# Patient Record
Sex: Female | Born: 1940
Health system: Southern US, Community
[De-identification: ages and names within clinical notes are randomized; demographics above are authoritative.]

## PROBLEM LIST (undated history)

## (undated) DIAGNOSIS — Z720 Tobacco use: Secondary | ICD-10-CM

## (undated) DIAGNOSIS — I499 Cardiac arrhythmia, unspecified: Secondary | ICD-10-CM

## (undated) DIAGNOSIS — I712 Thoracic aortic aneurysm, without rupture: Secondary | ICD-10-CM

## (undated) DIAGNOSIS — I509 Heart failure, unspecified: Secondary | ICD-10-CM

## (undated) DIAGNOSIS — IMO0002 Reserved for concepts with insufficient information to code with codable children: Secondary | ICD-10-CM

## (undated) DIAGNOSIS — L409 Psoriasis, unspecified: Secondary | ICD-10-CM

## (undated) DIAGNOSIS — J189 Pneumonia, unspecified organism: Secondary | ICD-10-CM

## (undated) DIAGNOSIS — M1990 Unspecified osteoarthritis, unspecified site: Secondary | ICD-10-CM

## (undated) DIAGNOSIS — C3490 Malignant neoplasm of unspecified part of unspecified bronchus or lung: Secondary | ICD-10-CM

## (undated) DIAGNOSIS — I10 Essential (primary) hypertension: Secondary | ICD-10-CM

## (undated) DIAGNOSIS — H43399 Other vitreous opacities, unspecified eye: Secondary | ICD-10-CM

## (undated) HISTORY — DX: Reserved for concepts with insufficient information to code with codable children: IMO0002

## (undated) HISTORY — DX: Thoracic aortic aneurysm, without rupture: I71.2

## (undated) HISTORY — DX: Tobacco use: Z72.0

## (undated) HISTORY — DX: Heart failure, unspecified: I50.9

## (undated) HISTORY — DX: Malignant neoplasm of unspecified part of unspecified bronchus or lung: C34.90

---

## 2011-08-21 DIAGNOSIS — R911 Solitary pulmonary nodule: Secondary | ICD-10-CM | POA: Insufficient documentation

## 2011-08-24 ENCOUNTER — Encounter: Payer: Self-pay | Admitting: *Deleted

## 2011-08-24 DIAGNOSIS — Z720 Tobacco use: Secondary | ICD-10-CM | POA: Insufficient documentation

## 2011-08-24 DIAGNOSIS — R911 Solitary pulmonary nodule: Secondary | ICD-10-CM

## 2011-08-28 ENCOUNTER — Institutional Professional Consult (permissible substitution) (INDEPENDENT_AMBULATORY_CARE_PROVIDER_SITE_OTHER): Payer: Medicare Other | Admitting: Thoracic Surgery (Cardiothoracic Vascular Surgery)

## 2011-08-28 ENCOUNTER — Encounter: Payer: Self-pay | Admitting: Thoracic Surgery (Cardiothoracic Vascular Surgery)

## 2011-08-28 ENCOUNTER — Other Ambulatory Visit: Payer: Self-pay | Admitting: Thoracic Surgery (Cardiothoracic Vascular Surgery)

## 2011-08-28 VITALS — BP 115/74 | HR 76 | Resp 20 | Ht 62.0 in | Wt 163.0 lb

## 2011-08-28 DIAGNOSIS — D381 Neoplasm of uncertain behavior of trachea, bronchus and lung: Secondary | ICD-10-CM

## 2011-08-28 DIAGNOSIS — R918 Other nonspecific abnormal finding of lung field: Secondary | ICD-10-CM

## 2011-08-28 DIAGNOSIS — R222 Localized swelling, mass and lump, trunk: Secondary | ICD-10-CM

## 2011-08-28 NOTE — Progress Notes (Signed)
PCP is Avon Gully, MD, MD Referring Provider is Avon Gully, MD  Chief Complaint  Patient presents with  . Lung Mass    Referral from Dr. Felecia Shelling for eval on lung mass, CTA Chest 08/21/11    HPI: 71 year old woman with history of tobacco abuse who presented last week with complaint of right arm pain. Her workup included a chest x-ray and then a CT of the chest. The CT of the chest showed a 1.7 x 1.2 cm mass in the central portion of the right upper lobe. She now is sent for evaluation of the lung mass.  She states this is still having some right arm pain she describes this as a pain on the medial aspect of the right upper arm and extending into her axilla this is related to movement and is not associated with shortness of breath. She states that also on the day that she went to the ED for evaluation of her right arm pain she first noted a small amount of hemoptysis. She's continued to have hemoptysis usually about 2-3 times a day again with very small amounts of clotted blood noted. She denies any chest tightness, shortness of breath, wheezing, weight loss.   Past Medical History  Diagnosis Date  . Tobacco abuse   . Lung nodule     No past surgical history on file.  Family History  Problem Relation Age of Onset  . Hypertension Mother   . Heart disease Mother   . Heart disease Sister   . Heart disease Brother     Social History History  Substance Use Topics  . Smoking status: Current Everyday Smoker -- 0.3 packs/day for 20 years    Types: Cigarettes  . Smokeless tobacco: Not on file  . Alcohol Use: Yes     casually    No current outpatient prescriptions on file.    No Known Allergies  Review of Systems  Constitutional: Negative for fever, activity change, appetite change, fatigue and unexpected weight change.  HENT: Negative.   Eyes: Negative.   Respiratory: Positive for cough (hemoptysis). Negative for chest tightness, shortness of breath and wheezing.     Cardiovascular: Positive for leg swelling (occassional, ankles). Negative for chest pain and palpitations.  Genitourinary: Negative.   Musculoskeletal:       Right axillary and arm pain with decreased ROM  Neurological: Negative for dizziness, syncope, speech difficulty, light-headedness and headaches.  Hematological: Negative.   Psychiatric/Behavioral: Negative.   All other systems reviewed and are negative.     BP 115/74  Pulse 76  Resp 20  Ht 5\' 2"  (1.575 m)  Wt 163 lb (73.936 kg)  BMI 29.81 kg/m2  SpO2 98% Physical Exam  Vitals reviewed. Constitutional: She is oriented to person, place, and time. She appears well-developed and well-nourished. No distress.  HENT:  Head: Normocephalic and atraumatic.  Eyes: EOM are normal. Pupils are equal, round, and reactive to light.  Neck: Normal range of motion. Neck supple. No JVD present. No tracheal deviation present. No thyromegaly present.  Cardiovascular: Normal rate, regular rhythm and normal heart sounds.  Exam reveals no gallop and no friction rub.   No murmur heard. Pulmonary/Chest: Effort normal and breath sounds normal. No respiratory distress. She has no wheezes.  Abdominal: Soft. There is no tenderness.  Musculoskeletal: She exhibits tenderness.  Lymphadenopathy:    She has no cervical adenopathy.  Neurological: She is alert and oriented to person, place, and time.  Skin: Skin is warm and dry.  Diagnostic Tests: CT of chest from 08/21/2011 reviewed. It shows a 1.7 x 1.3 cm central right upper lobe mass is relatively smooth and rounded and closely abutting the right upper lobe pulmonary artery. Multiple patchy densities in both upper lobes and right middle lobe. No distinct hilar or mediastinal adenopathy  Impression:  71 year old woman with a history of tobacco abuse now found to have a 1.7 x 1.3 cm right upper lobe mass. This has to be considered for lung cancer to be proven otherwise. There are multiple  indistinct patchy opacities in both lungs which may or may not be related to the mass itself. There's no definite adenopathy although the paratracheal nodes do appear a little prominent on the CT scan.  I recommended to Ms. Henault that we proceed with a PET/CT to further characterize the mass, evaluate the hilar and mediastinal nodes and rule out any evidence of distant spread..  We will also schedule her for pulmonary function testing with and without bronchodilators to assess her potential fitness for pulmonary resection. This lesion is very central require a lobectomy to remove the lesion.  Plan: PET CT and pulmonary function testing Followup in one to 2 weeks after studies are done to discuss the next step, which depending on the findings could be bronchoscopic biopsy versus proceeding with surgical resection.

## 2011-08-29 HISTORY — PX: OTHER SURGICAL HISTORY: SHX169

## 2011-09-05 ENCOUNTER — Ambulatory Visit (HOSPITAL_COMMUNITY)
Admission: RE | Admit: 2011-09-05 | Discharge: 2011-09-05 | Disposition: A | Discharge status: Home / Self Care | Payer: Medicare Other | Source: Ambulatory Visit | Attending: Thoracic Surgery (Cardiothoracic Vascular Surgery) | Admitting: Thoracic Surgery (Cardiothoracic Vascular Surgery)

## 2011-09-05 ENCOUNTER — Encounter (HOSPITAL_COMMUNITY)
Admission: RE | Admit: 2011-09-05 | Discharge: 2011-09-05 | Disposition: A | Discharge status: Home / Self Care | Payer: Medicare Other | Source: Ambulatory Visit | Attending: Thoracic Surgery (Cardiothoracic Vascular Surgery) | Admitting: Thoracic Surgery (Cardiothoracic Vascular Surgery)

## 2011-09-05 ENCOUNTER — Other Ambulatory Visit (HOSPITAL_COMMUNITY): Payer: Self-pay | Admitting: Radiology

## 2011-09-05 DIAGNOSIS — J984 Other disorders of lung: Secondary | ICD-10-CM | POA: Insufficient documentation

## 2011-09-05 DIAGNOSIS — K8020 Calculus of gallbladder without cholecystitis without obstruction: Secondary | ICD-10-CM | POA: Insufficient documentation

## 2011-09-05 DIAGNOSIS — D259 Leiomyoma of uterus, unspecified: Secondary | ICD-10-CM | POA: Insufficient documentation

## 2011-09-05 DIAGNOSIS — D381 Neoplasm of uncertain behavior of trachea, bronchus and lung: Secondary | ICD-10-CM

## 2011-09-05 DIAGNOSIS — R911 Solitary pulmonary nodule: Secondary | ICD-10-CM | POA: Insufficient documentation

## 2011-09-05 DIAGNOSIS — R222 Localized swelling, mass and lump, trunk: Secondary | ICD-10-CM | POA: Insufficient documentation

## 2011-09-05 LAB — BLOOD GAS, ARTERIAL
Acid-Base Excess: 2.2 mmol/L — ABNORMAL HIGH (ref 0.0–2.0)
Drawn by: 340271
O2 Saturation: 95.5 %
TCO2: 23.2 mmol/L (ref 0–100)
pCO2 arterial: 37.7 mmHg (ref 35.0–45.0)
pO2, Arterial: 79.6 mmHg — ABNORMAL LOW (ref 80.0–100.0)

## 2011-09-05 LAB — PULMONARY FUNCTION TEST

## 2011-09-05 LAB — GLUCOSE, CAPILLARY: Glucose-Capillary: 98 mg/dL (ref 70–99)

## 2011-09-05 MED ORDER — FLUDEOXYGLUCOSE F - 18 (FDG) INJECTION
19.6000 | Freq: Once | INTRAVENOUS | Status: AC | PRN
  Administered 2011-09-05: 19.6 via INTRAVENOUS

## 2011-09-05 MED ORDER — ALBUTEROL SULFATE (5 MG/ML) 0.5% IN NEBU
2.5000 mg | INHALATION_SOLUTION | Freq: Once | RESPIRATORY_TRACT | Status: AC
  Administered 2011-09-05: 2.5 mg via RESPIRATORY_TRACT

## 2011-09-07 DIAGNOSIS — C3490 Malignant neoplasm of unspecified part of unspecified bronchus or lung: Secondary | ICD-10-CM

## 2011-09-07 HISTORY — DX: Malignant neoplasm of unspecified part of unspecified bronchus or lung: C34.90

## 2011-09-11 ENCOUNTER — Encounter: Payer: Medicare Other | Admitting: Thoracic Surgery (Cardiothoracic Vascular Surgery)

## 2011-09-14 ENCOUNTER — Other Ambulatory Visit: Payer: Self-pay

## 2011-09-14 ENCOUNTER — Encounter: Payer: Self-pay | Admitting: Thoracic Surgery (Cardiothoracic Vascular Surgery)

## 2011-09-14 ENCOUNTER — Ambulatory Visit (INDEPENDENT_AMBULATORY_CARE_PROVIDER_SITE_OTHER): Payer: Medicare Other | Admitting: Thoracic Surgery (Cardiothoracic Vascular Surgery)

## 2011-09-14 VITALS — BP 159/95 | HR 70 | Resp 16 | Ht 68.0 in | Wt 162.0 lb

## 2011-09-14 DIAGNOSIS — R222 Localized swelling, mass and lump, trunk: Secondary | ICD-10-CM

## 2011-09-14 DIAGNOSIS — R918 Other nonspecific abnormal finding of lung field: Secondary | ICD-10-CM

## 2011-09-14 DIAGNOSIS — D381 Neoplasm of uncertain behavior of trachea, bronchus and lung: Secondary | ICD-10-CM

## 2011-09-14 DIAGNOSIS — Z7189 Other specified counseling: Secondary | ICD-10-CM

## 2011-09-14 DIAGNOSIS — Z712 Person consulting for explanation of examination or test findings: Secondary | ICD-10-CM

## 2011-09-14 NOTE — Progress Notes (Signed)
  HPI:  Heather [name] returns today to discuss the results of her testing. In the office on 08/28/2011 to evaluate a 1.7 x 1.2 cm right upper lobe mass. In the interim since her last visit she's been doing well no new complaints. Her right arm pain has improved.  She had a past on 09/05/2011 which showed marked hypermetabolic activity in the right upper lobe mass. There is a second peripheral area in the right upper lobe there was more family hypermetabolic, which is felt to represent some postobstructive pneumonitis. A femoral cavity in the left upper lobe showed no evidence of hypermetabolic activity, nor did the faint nodular densities in both lungs. There also was no evidence of metastatic disease.  She also had pulmonary function testing done. Her FEV1 was 2.36 (109% predicted), FVC was 3.03 (108% predicted), diffusion capacity corrected was 57%  No current outpatient prescriptions on file.    Physical Exam BP 159/95  Pulse 70  Resp 16  Ht 5' 8" (1.727 m)  Wt 162 lb (73.483 kg)  BMI 24.63 kg/m2  SpO2 99% General well-developed well-nourished, anxious but no acute distress Lungs clear with equal breath sounds  Diagnostic Tests: PET/CT and pulmonary function testing reviewed, findings as previously noted  Impression: 70-year-old woman with a history of tobacco abuse with a newly discovered 1.7 cm right upper lobe nodule which is hypermetabolic by PET. This in all likelihood represents primary bronchogenic carcinoma and has be considered such until proven otherwise. This appears to be a clinical stage I lesion.  I discussed with her the options of bronchoscopic biopsy versus proceeding with video-assisted thoracoscopy and right upper lobectomy. This lesion is nonamenable to a wedge resection for biopsy. I did discuss with her that with bronchoscopic biopsy if positive then she would need surgery. Unfortunately because of a relatively higher false-negative rate with bronchoscopic biopsy we  would never be 100% sure that this was not a cancer if it was not seen on the biopsy..  My recommendation is to proceed with right VATS, right upper lobectomy and lymph node dissection. I discussed in detail with her the indications, risks, benefits, and alternatives. We discussed the nature of the procedure, need for general anesthesia, incision be used, expected hospital stay, and overall recovery. She does understand that this is a cancer appeared to be relatively early stage a potential for a very good chance of cure, although it is never guaranteed. She understands that the risk of surgery include but are not limited to death, stroke, MI, bleeding, possible need for transfusion, infection, DVT, PE, prolonged air leak, as well as other unpredictable or unforeseeable complications. She understands and accepts these risks and agrees to proceed.  Plan: Right VATS, right upper lobectomy, lymph node dissection on Monday, 09/24/2011.   

## 2011-09-17 ENCOUNTER — Encounter (HOSPITAL_COMMUNITY): Payer: Self-pay | Admitting: Pharmacy Technician

## 2011-09-20 ENCOUNTER — Other Ambulatory Visit: Payer: Self-pay

## 2011-09-20 ENCOUNTER — Encounter (HOSPITAL_COMMUNITY)
Admission: RE | Admit: 2011-09-20 | Discharge: 2011-09-20 | Disposition: A | Discharge status: Home / Self Care | Payer: Medicare Other | Source: Ambulatory Visit | Attending: Thoracic Surgery (Cardiothoracic Vascular Surgery) | Admitting: Thoracic Surgery (Cardiothoracic Vascular Surgery)

## 2011-09-20 ENCOUNTER — Encounter (HOSPITAL_COMMUNITY): Payer: Self-pay

## 2011-09-20 VITALS — BP 158/89 | HR 58 | Temp 97.0°F | Resp 20 | Ht 67.0 in | Wt 170.2 lb

## 2011-09-20 DIAGNOSIS — D381 Neoplasm of uncertain behavior of trachea, bronchus and lung: Secondary | ICD-10-CM

## 2011-09-20 HISTORY — DX: Psoriasis, unspecified: L40.9

## 2011-09-20 HISTORY — DX: Unspecified osteoarthritis, unspecified site: M19.90

## 2011-09-20 HISTORY — DX: Other vitreous opacities, unspecified eye: H43.399

## 2011-09-20 LAB — URINE MICROSCOPIC-ADD ON

## 2011-09-20 LAB — COMPREHENSIVE METABOLIC PANEL
ALT: 14 U/L (ref 0–35)
AST: 21 U/L (ref 0–37)
Albumin: 3.6 g/dL (ref 3.5–5.2)
CO2: 23 mEq/L (ref 19–32)
Chloride: 104 mEq/L (ref 96–112)
GFR calc non Af Amer: 86 mL/min — ABNORMAL LOW (ref >90)
Potassium: 4.2 mEq/L (ref 3.5–5.1)
Sodium: 137 mEq/L (ref 135–145)
Total Bilirubin: 0.5 mg/dL (ref 0.3–1.2)

## 2011-09-20 LAB — CBC
Platelets: 233 10*3/uL (ref 150–400)
RBC: 3.96 MIL/uL (ref 3.87–5.11)
RDW: 14.4 % (ref 11.5–15.5)
WBC: 8.6 10*3/uL (ref 4.0–10.5)

## 2011-09-20 LAB — URINALYSIS, ROUTINE W REFLEX MICROSCOPIC
Glucose, UA: NEGATIVE mg/dL
Ketones, ur: NEGATIVE mg/dL
Nitrite: NEGATIVE
Protein, ur: NEGATIVE mg/dL
pH: 6.5 (ref 5.0–8.0)

## 2011-09-20 LAB — BLOOD GAS, ARTERIAL
Acid-Base Excess: 0.7 mmol/L (ref 0.0–2.0)
Drawn by: 206361
pCO2 arterial: 38.2 mmHg (ref 35.0–45.0)
pH, Arterial: 7.424 — ABNORMAL HIGH (ref 7.350–7.400)
pO2, Arterial: 102 mmHg — ABNORMAL HIGH (ref 80.0–100.0)

## 2011-09-20 LAB — TYPE AND SCREEN

## 2011-09-20 LAB — APTT: aPTT: 37 seconds (ref 24–37)

## 2011-09-20 LAB — PROTIME-INR: INR: 1.02 (ref 0.00–1.49)

## 2011-09-20 NOTE — Pre-Procedure Instructions (Signed)
20 ABBEGALE STEHLE  09/20/2011   Your procedure is scheduled on:  Monday, March 18th.  Report to [facility] at 5:30 AM.  Call this number if you have problems the morning of surgery: [phone]   Remember:   Do not eat food:After Midnight.  May have clear liquids: up to 4 Hours before arrival.  Clear liquids include soda, tea, black coffee, apple or grape juice, broth.  Take these medicines the morning of surgery with A SIP OF WATER: NA   Do not wear jewelry, make-up or nail polish.  Do not wear lotions, powders, or perfumes. You may wear deodorant.  Do not shave 48 hours prior to surgery.  Do not bring valuables to the hospital.  Contacts, dentures or bridgework may not be worn into surgery.  Leave suitcase in the car. After surgery it may be brought to your room.  For patients admitted to the hospital, checkout time is 11:00 AM the day of discharge.   Patients discharged the day of surgery will not be allowed to drive home.  Name and phone number of your driver: --  Special Instructions: CHG Shower Use Special Wash: 1/2 bottle night before surgery and 1/2 bottle morning of surgery.   Please read over the following fact sheets that you were given: Pain Booklet, Coughing and Deep Breathing, Blood Transfusion Information, MRSA Information and Surgical Site Infection Prevention

## 2011-09-23 MED ORDER — DEXTROSE 5 % IV SOLN
1.5000 g | INTRAVENOUS | Status: AC
  Administered 2011-09-24: 1.5 g via INTRAVENOUS
  Filled 2011-09-23: qty 1.5

## 2011-09-24 ENCOUNTER — Encounter (HOSPITAL_COMMUNITY): Payer: Self-pay | Admitting: Anesthesiology

## 2011-09-24 ENCOUNTER — Ambulatory Visit (HOSPITAL_COMMUNITY): Payer: Medicare Other | Admitting: Anesthesiology

## 2011-09-24 ENCOUNTER — Ambulatory Visit (HOSPITAL_COMMUNITY): Payer: Medicare Other

## 2011-09-24 ENCOUNTER — Encounter (HOSPITAL_COMMUNITY)
Admission: RE | Disposition: A | Discharge status: Home / Self Care | Payer: Self-pay | Source: Ambulatory Visit | Attending: Thoracic Surgery (Cardiothoracic Vascular Surgery)

## 2011-09-24 ENCOUNTER — Encounter (HOSPITAL_COMMUNITY): Payer: Self-pay | Admitting: Surgery

## 2011-09-24 ENCOUNTER — Inpatient Hospital Stay (HOSPITAL_COMMUNITY)
Admission: RE | Admit: 2011-09-24 | Discharge: 2011-10-01 | DRG: 164 | Disposition: A | Discharge status: Home / Self Care | Payer: Medicare Other | Source: Ambulatory Visit | Attending: Thoracic Surgery (Cardiothoracic Vascular Surgery) | Admitting: Thoracic Surgery (Cardiothoracic Vascular Surgery)

## 2011-09-24 DIAGNOSIS — D381 Neoplasm of uncertain behavior of trachea, bronchus and lung: Secondary | ICD-10-CM

## 2011-09-24 DIAGNOSIS — Z79899 Other long term (current) drug therapy: Secondary | ICD-10-CM

## 2011-09-24 DIAGNOSIS — K5900 Constipation, unspecified: Secondary | ICD-10-CM | POA: Diagnosis not present

## 2011-09-24 DIAGNOSIS — Z87891 Personal history of nicotine dependence: Secondary | ICD-10-CM

## 2011-09-24 DIAGNOSIS — C3410 Malignant neoplasm of upper lobe, unspecified bronchus or lung: Principal | ICD-10-CM | POA: Diagnosis present

## 2011-09-24 DIAGNOSIS — J95811 Postprocedural pneumothorax: Secondary | ICD-10-CM | POA: Diagnosis not present

## 2011-09-24 DIAGNOSIS — Z01812 Encounter for preprocedural laboratory examination: Secondary | ICD-10-CM

## 2011-09-24 DIAGNOSIS — I4891 Unspecified atrial fibrillation: Secondary | ICD-10-CM | POA: Diagnosis not present

## 2011-09-24 DIAGNOSIS — Y836 Removal of other organ (partial) (total) as the cause of abnormal reaction of the patient, or of later complication, without mention of misadventure at the time of the procedure: Secondary | ICD-10-CM | POA: Diagnosis not present

## 2011-09-24 DIAGNOSIS — Y9210 Unspecified residential institution as the place of occurrence of the external cause: Secondary | ICD-10-CM | POA: Diagnosis not present

## 2011-09-24 DIAGNOSIS — R110 Nausea: Secondary | ICD-10-CM | POA: Diagnosis not present

## 2011-09-24 SURGERY — VIDEO ASSISTED THORACOSCOPY (VATS)/ LOBECTOMY
Anesthesia: General | Site: Chest | Laterality: Right | Wound class: Clean Contaminated

## 2011-09-24 MED ORDER — BUPIVACAINE ON-Q PAIN PUMP (FOR ORDER SET NO CHG)
INJECTION | Status: DC
  Filled 2011-09-24: qty 1

## 2011-09-24 MED ORDER — DEXTROSE-NACL 5-0.45 % IV SOLN
INTRAVENOUS | Status: DC
  Administered 2011-09-24 – 2011-09-25 (×3): via INTRAVENOUS
  Administered 2011-09-26: 20 mL/h via INTRAVENOUS
  Administered 2011-09-26: 06:00:00 via INTRAVENOUS
  Administered 2011-09-30: 20 mL via INTRAVENOUS

## 2011-09-24 MED ORDER — MUPIROCIN 2 % EX OINT
TOPICAL_OINTMENT | CUTANEOUS | Status: AC
  Administered 2011-09-24: 1 via NASAL
  Filled 2011-09-24: qty 22

## 2011-09-24 MED ORDER — PROPOFOL 10 MG/ML IV EMUL
INTRAVENOUS | Status: DC | PRN
  Administered 2011-09-24: 120 mg via INTRAVENOUS

## 2011-09-24 MED ORDER — BISACODYL 5 MG PO TBEC
10.0000 mg | DELAYED_RELEASE_TABLET | Freq: Every day | ORAL | Status: DC
  Administered 2011-09-25 – 2011-09-30 (×6): 10 mg via ORAL
  Filled 2011-09-24 (×4): qty 2
  Filled 2011-09-24: qty 1
  Filled 2011-09-24 (×2): qty 2

## 2011-09-24 MED ORDER — LIDOCAINE HCL (CARDIAC) 20 MG/ML IV SOLN
INTRAVENOUS | Status: DC | PRN
  Administered 2011-09-24: 40 mg via INTRAVENOUS

## 2011-09-24 MED ORDER — LEVALBUTEROL HCL 0.63 MG/3ML IN NEBU
0.6300 mg | INHALATION_SOLUTION | Freq: Four times a day (QID) | RESPIRATORY_TRACT | Status: DC
  Administered 2011-09-24 – 2011-09-25 (×5): 0.63 mg via RESPIRATORY_TRACT
  Filled 2011-09-24 (×8): qty 3

## 2011-09-24 MED ORDER — OXYCODONE-ACETAMINOPHEN 5-325 MG PO TABS
1.0000 | ORAL_TABLET | ORAL | Status: DC | PRN
  Administered 2011-09-25 – 2011-09-30 (×8): 1 via ORAL
  Filled 2011-09-24 (×8): qty 1

## 2011-09-24 MED ORDER — MIDAZOLAM HCL 5 MG/5ML IJ SOLN
INTRAMUSCULAR | Status: DC | PRN
  Administered 2011-09-24 (×2): 1 mg via INTRAVENOUS

## 2011-09-24 MED ORDER — ENOXAPARIN SODIUM 40 MG/0.4ML ~~LOC~~ SOLN
40.0000 mg | Freq: Two times a day (BID) | SUBCUTANEOUS | Status: DC
  Administered 2011-09-25: 40 mg via SUBCUTANEOUS
  Filled 2011-09-24 (×3): qty 0.4

## 2011-09-24 MED ORDER — ONDANSETRON HCL 4 MG/2ML IJ SOLN
INTRAMUSCULAR | Status: DC | PRN
  Administered 2011-09-24: 4 mg via INTRAVENOUS

## 2011-09-24 MED ORDER — DIPHENHYDRAMINE HCL 12.5 MG/5ML PO ELIX
12.5000 mg | ORAL_SOLUTION | Freq: Four times a day (QID) | ORAL | Status: DC | PRN
  Filled 2011-09-24: qty 5

## 2011-09-24 MED ORDER — SODIUM CHLORIDE 0.9 % IJ SOLN: 9.0000 mL | INTRAMUSCULAR | Status: DC | PRN

## 2011-09-24 MED ORDER — LACTATED RINGERS IV SOLN
INTRAVENOUS | Status: DC | PRN
  Administered 2011-09-24: 07:00:00 via INTRAVENOUS

## 2011-09-24 MED ORDER — GLYCOPYRROLATE 0.2 MG/ML IJ SOLN
INTRAMUSCULAR | Status: DC | PRN
  Administered 2011-09-24: .6 mg via INTRAVENOUS

## 2011-09-24 MED ORDER — LACTATED RINGERS IV SOLN
INTRAVENOUS | Status: DC | PRN
  Administered 2011-09-24: 08:00:00 via INTRAVENOUS

## 2011-09-24 MED ORDER — VITAMIN B-12 250 MCG PO TABS: 250.0000 ug | ORAL_TABLET | Freq: Every day | ORAL | Status: DC

## 2011-09-24 MED ORDER — NALOXONE HCL 0.4 MG/ML IJ SOLN
0.4000 mg | INTRAMUSCULAR | Status: DC | PRN
  Filled 2011-09-24: qty 1

## 2011-09-24 MED ORDER — TRAMADOL HCL 50 MG PO TABS
50.0000 mg | ORAL_TABLET | Freq: Four times a day (QID) | ORAL | Status: DC | PRN
Start: 2011-09-24 — End: 2011-10-01
  Administered 2011-09-28: 50 mg via ORAL
  Administered 2011-09-29: 100 mg via ORAL
  Administered 2011-09-29 (×2): 50 mg via ORAL
  Administered 2011-09-30 – 2011-10-01 (×2): 100 mg via ORAL
  Filled 2011-09-24 (×2): qty 2
  Filled 2011-09-24: qty 1
  Filled 2011-09-24: qty 2
  Filled 2011-09-24 (×2): qty 1

## 2011-09-24 MED ORDER — OXYCODONE-ACETAMINOPHEN 5-325 MG PO TABS
1.0000 | ORAL_TABLET | ORAL | Status: DC | PRN
  Administered 2011-09-26: 1 via ORAL
  Administered 2011-10-01: 2 via ORAL
  Filled 2011-09-24: qty 1
  Filled 2011-09-24: qty 2

## 2011-09-24 MED ORDER — DIPHENHYDRAMINE HCL 50 MG/ML IJ SOLN
12.5000 mg | Freq: Four times a day (QID) | INTRAMUSCULAR | Status: DC | PRN
  Filled 2011-09-24: qty 0.25

## 2011-09-24 MED ORDER — ROCURONIUM BROMIDE 100 MG/10ML IV SOLN
INTRAVENOUS | Status: DC | PRN
  Administered 2011-09-24: 5 mg via INTRAVENOUS
  Administered 2011-09-24: 10 mg via INTRAVENOUS
  Administered 2011-09-24: 70 mg via INTRAVENOUS
  Administered 2011-09-24: 10 mg via INTRAVENOUS
  Administered 2011-09-24: 5 mg via INTRAVENOUS

## 2011-09-24 MED ORDER — EPHEDRINE SULFATE 50 MG/ML IJ SOLN
INTRAMUSCULAR | Status: DC | PRN
  Administered 2011-09-24: 10 mg via INTRAVENOUS

## 2011-09-24 MED ORDER — HEMOSTATIC AGENTS (NO CHARGE) OPTIME
TOPICAL | Status: DC | PRN
  Administered 2011-09-24: 1 via TOPICAL

## 2011-09-24 MED ORDER — SENNOSIDES-DOCUSATE SODIUM 8.6-50 MG PO TABS
1.0000 | ORAL_TABLET | Freq: Every evening | ORAL | Status: DC | PRN
  Filled 2011-09-24: qty 1

## 2011-09-24 MED ORDER — VECURONIUM BROMIDE 10 MG IV SOLR
INTRAVENOUS | Status: DC | PRN
  Administered 2011-09-24: 1 mg via INTRAVENOUS

## 2011-09-24 MED ORDER — BUPIVACAINE 0.25 % ON-Q PUMP SINGLE CATH 400 ML
INJECTION | Status: DC | PRN
  Administered 2011-09-24: 400 mL

## 2011-09-24 MED ORDER — DROPERIDOL 2.5 MG/ML IJ SOLN: 0.6250 mg | INTRAMUSCULAR | Status: DC | PRN

## 2011-09-24 MED ORDER — FENTANYL CITRATE 0.05 MG/ML IJ SOLN
INTRAMUSCULAR | Status: DC | PRN
  Administered 2011-09-24 (×3): 50 ug via INTRAVENOUS
  Administered 2011-09-24: 150 ug via INTRAVENOUS
  Administered 2011-09-24: 50 ug via INTRAVENOUS
  Administered 2011-09-24: 25 ug via INTRAVENOUS
  Administered 2011-09-24: 50 ug via INTRAVENOUS
  Administered 2011-09-24: 25 ug via INTRAVENOUS
  Administered 2011-09-24: 50 ug via INTRAVENOUS

## 2011-09-24 MED ORDER — BUPIVACAINE 0.25 % ON-Q PUMP SINGLE CATH 400 ML
400.0000 mL | INJECTION | Status: DC
  Filled 2011-09-24: qty 400

## 2011-09-24 MED ORDER — FENTANYL 10 MCG/ML IV SOLN
INTRAVENOUS | Status: DC
  Administered 2011-09-24: 20 ug via INTRAVENOUS
  Administered 2011-09-24: 13:00:00 via INTRAVENOUS
  Administered 2011-09-24 – 2011-09-25 (×2): 30 ug via INTRAVENOUS
  Administered 2011-09-25: 40 ug via INTRAVENOUS
  Administered 2011-09-25: 50 ug via INTRAVENOUS
  Administered 2011-09-26: 40 ug via INTRAVENOUS
  Administered 2011-09-26: 30 ug via INTRAVENOUS
  Administered 2011-09-26: 40 ug via INTRAVENOUS
  Administered 2011-09-27: 30 ug via INTRAVENOUS
  Administered 2011-09-27: 20 ug via INTRAVENOUS
  Administered 2011-09-27: 50 ug via INTRAVENOUS
  Administered 2011-09-27: 70 ug via INTRAVENOUS
  Administered 2011-09-27 (×2): 40 ug via INTRAVENOUS
  Administered 2011-09-28: via INTRAVENOUS
  Administered 2011-09-28: 88 ug via INTRAVENOUS
  Administered 2011-09-28: 130 ug via INTRAVENOUS
  Administered 2011-09-28: 100 ug via INTRAVENOUS
  Administered 2011-09-28: 50 ug via INTRAVENOUS
  Administered 2011-09-29: 05:00:00 via INTRAVENOUS
  Administered 2011-09-29: 90 ug via INTRAVENOUS
  Administered 2011-09-29 (×2): 20 ug via INTRAVENOUS
  Administered 2011-09-30: 40 ug via INTRAVENOUS
  Administered 2011-09-30: 20 ug via INTRAVENOUS
  Filled 2011-09-24 (×5): qty 50

## 2011-09-24 MED ORDER — 0.9 % SODIUM CHLORIDE (POUR BTL) OPTIME
TOPICAL | Status: DC | PRN
  Administered 2011-09-24: 3000 mL

## 2011-09-24 MED ORDER — CYANOCOBALAMIN 250 MCG PO TABS
250.0000 ug | ORAL_TABLET | Freq: Every day | ORAL | Status: DC
  Administered 2011-09-25 – 2011-09-26 (×2): 250 ug via ORAL
  Administered 2011-09-27: 11:00:00 via ORAL
  Administered 2011-09-28 – 2011-10-01 (×4): 250 ug via ORAL
  Filled 2011-09-24 (×8): qty 1

## 2011-09-24 MED ORDER — DEXTROSE 5 % IV SOLN
1.5000 g | Freq: Two times a day (BID) | INTRAVENOUS | Status: AC
  Administered 2011-09-24 – 2011-09-25 (×2): 1.5 g via INTRAVENOUS
  Filled 2011-09-24 (×2): qty 1.5

## 2011-09-24 MED ORDER — ACETAMINOPHEN 10 MG/ML IV SOLN
1000.0000 mg | Freq: Four times a day (QID) | INTRAVENOUS | Status: AC
  Administered 2011-09-24 – 2011-09-25 (×4): 1000 mg via INTRAVENOUS
  Filled 2011-09-24 (×4): qty 100

## 2011-09-24 MED ORDER — ONDANSETRON HCL 4 MG/2ML IJ SOLN
4.0000 mg | Freq: Four times a day (QID) | INTRAMUSCULAR | Status: DC | PRN
  Administered 2011-09-24 – 2011-09-25 (×2): 4 mg via INTRAVENOUS
  Filled 2011-09-24 (×3): qty 2

## 2011-09-24 MED ORDER — NEOSTIGMINE METHYLSULFATE 1 MG/ML IJ SOLN
INTRAMUSCULAR | Status: DC | PRN
  Administered 2011-09-24: 4.5 mg via INTRAVENOUS

## 2011-09-24 MED ORDER — OXYCODONE HCL 5 MG PO TABS: 5.0000 mg | ORAL_TABLET | ORAL | Status: AC | PRN

## 2011-09-24 MED ORDER — ONDANSETRON HCL 4 MG/2ML IJ SOLN: 4.0000 mg | Freq: Four times a day (QID) | INTRAMUSCULAR | Status: DC | PRN

## 2011-09-24 MED ORDER — HYDROMORPHONE HCL PF 1 MG/ML IJ SOLN
0.2500 mg | INTRAMUSCULAR | Status: DC | PRN
  Administered 2011-09-24: 0.5 mg via INTRAVENOUS
  Administered 2011-09-24: 1 mg via INTRAVENOUS
  Administered 2011-09-24: 0.5 mg via INTRAVENOUS

## 2011-09-24 MED ORDER — POTASSIUM CHLORIDE 10 MEQ/50ML IV SOLN: 10.0000 meq | Freq: Every day | INTRAVENOUS | Status: DC | PRN

## 2011-09-24 SURGICAL SUPPLY — 79 items
APPLIER CLIP ROT 10 11.4 M/L (STAPLE)
CANISTER SUCTION 2500CC (MISCELLANEOUS) ×4 IMPLANT
CATH KIT ON Q 5IN SLV (PAIN MANAGEMENT) ×2 IMPLANT
CATH THORACIC 28FR (CATHETERS) ×2 IMPLANT
CATH THORACIC 28FR RT ANG (CATHETERS) ×2 IMPLANT
CATH THORACIC 36FR (CATHETERS) IMPLANT
CATH THORACIC 36FR RT ANG (CATHETERS) IMPLANT
CLIP APPLIE ROT 10 11.4 M/L (STAPLE) IMPLANT
CLIP TI MEDIUM 24 (CLIP) ×2 IMPLANT
CLIP TI MEDIUM 6 (CLIP) ×2 IMPLANT
CLOSURE STERI-STRIP 1/4X4 (GAUZE/BANDAGES/DRESSINGS) ×2 IMPLANT
CLOTH BEACON ORANGE TIMEOUT ST (SAFETY) ×2 IMPLANT
CONN Y 3/8X3/8X3/8  BEN (MISCELLANEOUS) ×1
CONN Y 3/8X3/8X3/8 BEN (MISCELLANEOUS) ×1 IMPLANT
CONT SPEC 4OZ CLIKSEAL STRL BL (MISCELLANEOUS) ×10 IMPLANT
DERMABOND ADVANCED (GAUZE/BANDAGES/DRESSINGS) ×1
DERMABOND ADVANCED .7 DNX12 (GAUZE/BANDAGES/DRESSINGS) ×1 IMPLANT
DRAIN CHANNEL 32F RND 10.7 FF (WOUND CARE) ×2 IMPLANT
DRAPE LAPAROSCOPIC ABDOMINAL (DRAPES) ×2 IMPLANT
DRAPE SLUSH MACHINE 52X66 (DRAPES) IMPLANT
DRAPE SLUSH/WARMER DISC (DRAPES) IMPLANT
DRSG TEGADERM 4X4.75 (GAUZE/BANDAGES/DRESSINGS) ×2 IMPLANT
ELECT REM PT RETURN 9FT ADLT (ELECTROSURGICAL) ×2
ELECTRODE REM PT RTRN 9FT ADLT (ELECTROSURGICAL) ×1 IMPLANT
GLOVE BIOGEL PI IND STRL 6 (GLOVE) ×1 IMPLANT
GLOVE BIOGEL PI INDICATOR 6 (GLOVE) ×1
GLOVE EUDERMIC 7 POWDERFREE (GLOVE) ×4 IMPLANT
GOWN PREVENTION PLUS XLARGE (GOWN DISPOSABLE) ×2 IMPLANT
GOWN STRL NON-REIN LRG LVL3 (GOWN DISPOSABLE) ×6 IMPLANT
HANDLE STAPLE ENDO GIA SHORT (STAPLE) ×1
HEMOSTAT SURGICEL 2X14 (HEMOSTASIS) ×2 IMPLANT
KIT BASIN OR (CUSTOM PROCEDURE TRAY) ×2 IMPLANT
KIT ROOM TURNOVER OR (KITS) ×2 IMPLANT
KIT SUCTION CATH 14FR (SUCTIONS) ×2 IMPLANT
NS IRRIG 1000ML POUR BTL (IV SOLUTION) ×6 IMPLANT
PACK CHEST (CUSTOM PROCEDURE TRAY) ×2 IMPLANT
PAD ARMBOARD 7.5X6 YLW CONV (MISCELLANEOUS) ×4 IMPLANT
POUCH ENDO CATCH II 15MM (MISCELLANEOUS) ×2 IMPLANT
RELOAD EGIA 45 MED/THCK PURPLE (STAPLE) ×8 IMPLANT
RELOAD EGIA 45 TAN VASC (STAPLE) ×10 IMPLANT
RELOAD EGIA 60 MED/THCK PURPLE (STAPLE) ×2 IMPLANT
RELOAD EGIA TRIS TAN 45 CVD (STAPLE) ×4 IMPLANT
RELOAD ENDO GIA 30 2.5 (STAPLE) ×2 IMPLANT
SEALANT PROGEL (MISCELLANEOUS) IMPLANT
SEALANT SURG COSEAL 4ML (VASCULAR PRODUCTS) IMPLANT
SEALANT SURG COSEAL 8ML (VASCULAR PRODUCTS) IMPLANT
SOLUTION ANTI FOG 6CC (MISCELLANEOUS) ×4 IMPLANT
SPECIMEN JAR MEDIUM (MISCELLANEOUS) ×2 IMPLANT
SPONGE GAUZE 4X4 12PLY (GAUZE/BANDAGES/DRESSINGS) ×2 IMPLANT
SPONGE INTESTINAL PEANUT (DISPOSABLE) ×2 IMPLANT
STAPLER ENDO GIA 12MM SHORT (STAPLE) ×1 IMPLANT
STAPLER ENDO NO KNIFE (STAPLE) IMPLANT
SUT PROLENE 4 0 RB 1 (SUTURE)
SUT PROLENE 4-0 RB1 .5 CRCL 36 (SUTURE) IMPLANT
SUT SILK  1 MH (SUTURE) ×3
SUT SILK 1 MH (SUTURE) ×3 IMPLANT
SUT SILK 2 0SH CR/8 30 (SUTURE) IMPLANT
SUT SILK 3 0SH CR/8 30 (SUTURE) IMPLANT
SUT VIC AB 1 CTX 36 (SUTURE) ×1
SUT VIC AB 1 CTX36XBRD ANBCTR (SUTURE) ×1 IMPLANT
SUT VIC AB 2-0 CTX 36 (SUTURE) ×2 IMPLANT
SUT VIC AB 2-0 UR6 27 (SUTURE) ×2 IMPLANT
SUT VIC AB 3-0 MH 27 (SUTURE) IMPLANT
SUT VIC AB 3-0 SH 27 (SUTURE) ×1
SUT VIC AB 3-0 SH 27X BRD (SUTURE) ×1 IMPLANT
SUT VIC AB 3-0 X1 27 (SUTURE) ×6 IMPLANT
SUT VICRYL 2 TP 1 (SUTURE) ×2 IMPLANT
SWAB COLLECTION DEVICE MRSA (MISCELLANEOUS) IMPLANT
SYSTEM SAHARA CHEST DRAIN ATS (WOUND CARE) ×2 IMPLANT
TAPE CLOTH 4X10 WHT NS (GAUZE/BANDAGES/DRESSINGS) ×2 IMPLANT
TAPE CLOTH SURG 4X10 WHT LF (GAUZE/BANDAGES/DRESSINGS) ×2 IMPLANT
TIP APPLICATOR SPRAY EXTEND 16 (VASCULAR PRODUCTS) IMPLANT
TOWEL OR 17X24 6PK STRL BLUE (TOWEL DISPOSABLE) ×2 IMPLANT
TOWEL OR 17X26 10 PK STRL BLUE (TOWEL DISPOSABLE) ×2 IMPLANT
TRAP SPECIMEN MUCOUS 40CC (MISCELLANEOUS) IMPLANT
TRAY FOLEY CATH 14FRSI W/METER (CATHETERS) ×2 IMPLANT
TUBE ANAEROBIC SPECIMEN COL (MISCELLANEOUS) IMPLANT
TUNNELER SHEATH ON-Q 11GX8 (MISCELLANEOUS) ×2 IMPLANT
WATER STERILE IRR 1000ML POUR (IV SOLUTION) ×4 IMPLANT

## 2011-09-24 NOTE — Preoperative (Signed)
Beta Blockers   Reason not to administer Beta Blockers:Not Applicable 

## 2011-09-24 NOTE — Brief Op Note (Addendum)
09/24/2011  11:08 AM  PATIENT:  Oralia Rud  71 y.o. female  PRE-OPERATIVE DIAGNOSIS:  right upper lobe lung mass  POST-OPERATIVE DIAGNOSIS:  right upper lobe lung mass  PROCEDURE:  Procedure(s) (LRB): VIDEO ASSISTED THORACOSCOPY (VATS)/ Right Upper Lobectomy, mediastinal Lymph node dissection, placement of On-Q local anesthetic catheter  SURGEON:  Surgeon(s) and Role:    * Loreli Slot, MD - Primary  PHYSICIAN ASSISTANT: Lowella Dandy PA-C  ANESTHESIA:   general  EBL:  Total I/O In: 1000 [I.V.:1000] Out: 677 [Urine:377; Blood:300]  DRAINS: 28 F chest tube and blake drain along right thorax   LOCAL MEDICATIONS USED:  MARCAINE On Q pump  SPECIMEN:  R upper lobe, level 10, 4R and 7 LN  DISPOSITION OF SPECIMEN:  RUL to Pathology, Lympn nodes to pathology   PATIENT DISPOSITION:  PACU - hemodynamically stable.'  Frozen- neoplastic, margins clear

## 2011-09-24 NOTE — Interval H&P Note (Signed)
History and Physical Interval Note:  09/24/2011 7:28 AM  Heather [name]  has presented today for surgery, with the diagnosis of right upper lobe lung mass  The various methods of treatment have been discussed with the patient and family. After consideration of risks, benefits and other options for treatment, the patient has consented to  Procedure(s) (LRB): VIDEO ASSISTED THORACOSCOPY (VATS)/ LOBECTOMY (Right) as a surgical intervention .  The patients' history has been reviewed, patient examined, no change in status, stable for surgery.  I have reviewed the patients' chart and labs.  Questions were answered to the patient's satisfaction.     [name]  No interval change. Refer to original office note for full h & P

## 2011-09-24 NOTE — H&P (View-Only) (Signed)
  HPI:  Mrs. Ruis returns today to discuss the results of her testing. In the office on 08/28/2011 to evaluate a 1.7 x 1.2 cm right upper lobe mass. In the interim since her last visit she's been doing well no new complaints. Her right arm pain has improved.  She had a past on 09/05/2011 which showed marked hypermetabolic activity in the right upper lobe mass. There is a second peripheral area in the right upper lobe there was more family hypermetabolic, which is felt to represent some postobstructive pneumonitis. A femoral cavity in the left upper lobe showed no evidence of hypermetabolic activity, nor did the faint nodular densities in both lungs. There also was no evidence of metastatic disease.  She also had pulmonary function testing done. Her FEV1 was 2.36 (109% predicted), FVC was 3.03 (108% predicted), diffusion capacity corrected was 57%  No current outpatient prescriptions on file.    Physical Exam BP 159/95  Pulse 70  Resp 16  Ht 5\' 8"  (1.727 m)  Wt 162 lb (73.483 kg)  BMI 24.63 kg/m2  SpO2 99% General well-developed well-nourished, anxious but no acute distress Lungs clear with equal breath sounds  Diagnostic Tests: PET/CT and pulmonary function testing reviewed, findings as previously noted  Impression: 71 year old woman with a history of tobacco abuse with a newly discovered 1.7 cm right upper lobe nodule which is hypermetabolic by PET. This in all likelihood represents primary bronchogenic carcinoma and has be considered such until proven otherwise. This appears to be a clinical stage I lesion.  I discussed with her the options of bronchoscopic biopsy versus proceeding with video-assisted thoracoscopy and right upper lobectomy. This lesion is nonamenable to a wedge resection for biopsy. I did discuss with her that with bronchoscopic biopsy if positive then she would need surgery. Unfortunately because of a relatively higher false-negative rate with bronchoscopic biopsy we  would never be 100% sure that this was not a cancer if it was not seen on the biopsy..  My recommendation is to proceed with right VATS, right upper lobectomy and lymph node dissection. I discussed in detail with her the indications, risks, benefits, and alternatives. We discussed the nature of the procedure, need for general anesthesia, incision be used, expected hospital stay, and overall recovery. She does understand that this is a cancer appeared to be relatively early stage a potential for a very good chance of cure, although it is never guaranteed. She understands that the risk of surgery include but are not limited to death, stroke, MI, bleeding, possible need for transfusion, infection, DVT, PE, prolonged air leak, as well as other unpredictable or unforeseeable complications. She understands and accepts these risks and agrees to proceed.  Plan: Right VATS, right upper lobectomy, lymph node dissection on Monday, 09/24/2011.

## 2011-09-24 NOTE — Op Note (Signed)
NAMERYENN, HOWETH               ACCOUNT NO.:  [id number]  MEDICAL RECORD NO.:  [id number]  LOCATION:  3315                         FACILITY:  [facility]  PHYSICIAN:  Salvatore Decent. Dorris Fetch, M.D.DATE OF BIRTH:  1940-11-28  DATE OF PROCEDURE: DATE OF DISCHARGE:                              OPERATIVE REPORT   PREOPERATIVE DIAGNOSIS:  Right upper lobe mass.  POSTOPERATIVE DIAGNOSIS:  Right upper lobe neoplasm.  PROCEDURE:  Right video-assisted thoracoscopy, right upper lobectomy, mediastinal lymph node dissection, placement of On-Q local anesthetic catheter.  SURGEONS:  Salvatore Decent. Dorris Fetch, M.D.  ASSISTANTDenny Peon Barrett.  ANESTHESIA:  General.  FINDINGS:  Approximately 3 cm mass centrally in the right upper lobe. Frozen section revealed neoplasm.  Margins were clear. Enlarged but otherwise benign-appearing level 10, 4R and 7 lymph nodes.  CLINICAL NOTE:  Heather [name] is a 71 year old woman with a history of smoking, who was found to have a 1.7 cm right upper lobe mass after she had presented with hemoptysis.  This mass was hypermetabolic by PET, and the patient was advised to undergo surgical resection.  As the mass was very centrally located, it was not accessible for wedge resection. As she was a surgical candidate, she was advised to undergo right VATS, right upper lobectomy with lymph node dissection. The indications, risks, benefits, and alternatives were discussed in detail with the patient.  She understood and accepted the risks and agreed to proceed.  OPERATIVE NOTE:  Ms. Forgione was brought to the preop holding area on September 24, 2011.  There, the anesthesia service placed a central line and arterial blood pressure monitoring line.  PAS were placed for DVT prophylaxis.  Intravenous antibiotics were administered.  She was taken to the operating room, anesthetized, and intubated with a double-lumen endotracheal tube.  She was placed in the left lateral decubitus position,  and the right chest was prepped and draped in the usual sterile fashion.  Single lung ventilation of the left lung was carried out and was tolerated well throughout the procedure.  An incision was made in the midaxillary line in approximately the 7th intercostal space.  It was carried through the skin and subcutaneous tissue, and the chest was entered bluntly using a Hemostat, a port was inserted, and the thoracoscope was placed into the chest.  There were some adhesions of the right upper middle lobe to the anterior chest wall, a small port incision was made posteriorly below the tip of the scapula for instrumentation and then a small utility thoracotomy incision was made.  This was approximately 8 cm in length.  The ribs were not spread during the procedure.  The adhesions were taken down. There were some additional adhesions of the right upper lobe to the anterior mediastinum.  These were thin filmy adhesions, which were likewise taken down.  The adhesions between the upper and middle lobe were divided with an Endo-GIA stapler.  After that, it was clear that the fissures were nearly completely separate.  The pleural reflection was divided at the hilum.  The superior pulmonary vein was identified in the upper lobe superior pulmonary vein.  Branches were dissected out and divided with an Endo GIA stapler.  A small amount of lung tissue over the minor fissure was divided with an Endo-GIA stapler, exposing the pulmonary artery and the posterior ascending branch.  Next, the pleural reflection was divided more superiorly exposing the right upper lobe arterial branches.  These were carefully dissected out, and then divided with an Endo-GIA stapler.  Next, the posterior ascending branch was dissected out, and likewise divided with an Endo- GIA stapler as it was the larger than the branch typically as in that location.  During the dissection of the pulmonary arteries an enlarged level 10  lymph node was found.  This was soft and otherwise benign in appearance other than its size, and dissection of that lymph node did allow easier encirclement of the pulmonary artery branches. The level 10 node was sent as a separate specimen.  Next, the bronchus was dissected out.  An Endo-GIA stapler was placed across the bronchus and closed but not fired.  A chest inflation showed good aeration of the lower and middle lobe, which were then once again deflated, the stapler was fired, dividing the bronchus.  The remainder of the major fissure was then divided with a single firing of an Endo- GIA stapler.  Specimen was placed in an endoscopic retrieval bag and removed and sent for frozen section.  Frozen section subsequently revealed a neoplastic mass with negative margins.  The chest was filled with warm saline.  A test inflation to 30 cm water revealed no leak from the bronchial stump.  The saline was evacuated, the azygous vein was retracted exposing the paratracheal nodes and level 4 nodes were resected.  There were multiple nodes so which appeared mildly enlarged but none of which were grossly cancerous.  There was bleeding after the nodes were removed, and Surgicel was placed at the site.  Next, the lung was retracted anteriorly and the posterior pleural reflection was incised exposing the subcarinal lymph nodes.  These nodes were grossly enlarged but again otherwise had a benign reactive appearance that were friable and did bleed easily.  Again, after this, the subcarinal nodes had been removed.  The area was packed with Surgicel.  The inferior pulmonary ligament was divided, but no node was identified at that site.  The On-Q local anesthetic catheter was placed through a separate stab wound incision posteriorly and tunneled in a subpleural location and secured with a 3-0 silk suture.  0.25% Marcaine was instilled through the catheter.  The middle lobe was completely free of any  attachments to the lower lobe to avoid torsion.  The edge in the lobe was stapled to the edge of the lower lobe.  A 36-French Blake drain was placed through the original port incision and tunneled into the posterior apical position.  A 28-French chest tube was placed through a separate stab wound incision and placed anteriorly and apically, both were secured with #1 silk sutures.  A final inspection was made for hemostasis.  The posterior port incision was closed with #1 Vicryl fascial suture followed by 3-0 Vicryl subcuticular suture.  The mini-thoracotomy incision was closed with a running #1 Vicryl suture to reapproximate the serratus muscle.  The subcutaneous tissue was closed with a 2-0 Vicryl running suture, and the skin was closed with 3-0 Vicryl subcuticular suture.  All sponge, needle, instrument counts were correct at the end of the procedure.  The patient was placed back in supine position.  She was extubated in the operating room and taken to the postanesthetic care unit in good condition.  Salvatore Decent Dorris Fetch, M.D.    SCH/MEDQ  D:  09/24/2011  T:  09/24/2011  Job:  [id number]

## 2011-09-24 NOTE — Anesthesia Preprocedure Evaluation (Addendum)
Anesthesia Evaluation  Patient identified by MRN, date of birth, ID band Patient awake    Reviewed: Allergy & Precautions, H&P , NPO status , Patient's Chart, lab work & pertinent test results  Airway       Dental   Pulmonary Current Smoker,    Pulmonary exam normal       Cardiovascular     Neuro/Psych    GI/Hepatic negative GI ROS, Neg liver ROS,   Endo/Other  negative endocrine ROS  Renal/GU negative Renal ROS     Musculoskeletal   Abdominal   Peds  Hematology negative hematology ROS (+)   Anesthesia Other Findings   Reproductive/Obstetrics                         Anesthesia Physical Anesthesia Plan  ASA: III  Anesthesia Plan: General   Post-op Pain Management:    Induction: Intravenous  Airway Management Planned: Double Lumen EBT  Additional Equipment: Arterial line  Intra-op Plan:   Post-operative Plan: Extubation in OR  Informed Consent: I have reviewed the patients History and Physical, chart, labs and discussed the procedure including the risks, benefits and alternatives for the proposed anesthesia with the patient or authorized representative who has indicated his/her understanding and acceptance.   Dental advisory given  Plan Discussed with: Anesthesiologist, Surgeon and CRNA  Anesthesia Plan Comments:        Anesthesia Quick Evaluation

## 2011-09-24 NOTE — Anesthesia Postprocedure Evaluation (Signed)
Anesthesia Post Note  Patient: Heather [name]  Procedure(s) Performed: Procedure(s) (LRB): VIDEO ASSISTED THORACOSCOPY (VATS)/ LOBECTOMY (Right)  Anesthesia type: general  Patient location: PACU  Post pain: Pain level controlled  Post assessment: Patient's Cardiovascular Status Stable  Last Vitals:  Filed Vitals:   09/24/11 1115  BP:   Pulse:   Temp: 36 C  Resp:     Post vital signs: Reviewed and stable  Level of consciousness: sedated  Complications: No apparent anesthesia complications

## 2011-09-24 NOTE — Anesthesia Procedure Notes (Signed)
Procedure Name: Intubation Date/Time: 09/24/2011 7:50 AM Performed by: Julianne Rice K Pre-anesthesia Checklist: Patient identified, Timeout performed, Emergency Drugs available, Suction available and Patient being monitored Patient Re-evaluated:Patient Re-evaluated prior to inductionOxygen Delivery Method: Circle system utilized Preoxygenation: Pre-oxygenation with 100% oxygen Intubation Type: IV induction Ventilation: Mask ventilation without difficulty Laryngoscope Size: Mac and 3 Grade View: Grade II Endobronchial tube: Left and 37 Fr Number of attempts: 1 Airway Equipment and Method: Stylet and Fiberoptic brochoscope Placement Confirmation: ETT inserted through vocal cords under direct vision Secured at: 24 cm Tube secured with: Tape Dental Injury: Teeth and Oropharynx as per pre-operative assessment

## 2011-09-24 NOTE — Transfer of Care (Signed)
Immediate Anesthesia Transfer of Care Note  Patient: Heather [name]  Procedure(s) Performed: Procedure(s) (LRB): VIDEO ASSISTED THORACOSCOPY (VATS)/ LOBECTOMY (Right)  Patient Location: PACU  Anesthesia Type: General  Level of Consciousness: awake  Airway & Oxygen Therapy: Patient Spontanous Breathing and Patient connected to face mask oxygen  Post-op Assessment: Report given to PACU RN, Post -op Vital signs reviewed and stable and Patient moving all extremities  Post vital signs: Reviewed and stable  Complications: No apparent anesthesia complications

## 2011-09-25 ENCOUNTER — Inpatient Hospital Stay (HOSPITAL_COMMUNITY): Payer: Medicare Other

## 2011-09-25 LAB — BLOOD GAS, ARTERIAL
Acid-Base Excess: 4.1 mmol/L — ABNORMAL HIGH (ref 0.0–2.0)
Drawn by: 33098
O2 Content: 2 L/min
pCO2 arterial: 42.3 mmHg (ref 35.0–45.0)

## 2011-09-25 LAB — BASIC METABOLIC PANEL
BUN: 13 mg/dL (ref 6–23)
Creatinine, Ser: 0.67 mg/dL (ref 0.50–1.10)
GFR calc Af Amer: >90 mL/min (ref >90)
GFR calc non Af Amer: 86 mL/min — ABNORMAL LOW (ref >90)
Potassium: 4 mEq/L (ref 3.5–5.1)

## 2011-09-25 LAB — CBC
MCH: 30.4 pg (ref 26.0–34.0)
Platelets: 182 10*3/uL (ref 150–400)
RBC: 3.68 MIL/uL — ABNORMAL LOW (ref 3.87–5.11)
WBC: 8.7 10*3/uL (ref 4.0–10.5)

## 2011-09-25 MED ORDER — MUPIROCIN 2 % EX OINT
TOPICAL_OINTMENT | Freq: Two times a day (BID) | CUTANEOUS | Status: DC
  Administered 2011-09-25 – 2011-10-01 (×13): via NASAL
  Filled 2011-09-25: qty 22

## 2011-09-25 MED ORDER — ENOXAPARIN SODIUM 40 MG/0.4ML ~~LOC~~ SOLN
40.0000 mg | SUBCUTANEOUS | Status: DC
  Administered 2011-09-26 – 2011-10-01 (×6): 40 mg via SUBCUTANEOUS
  Filled 2011-09-25 (×8): qty 0.4

## 2011-09-25 MED ORDER — LEVALBUTEROL HCL 0.63 MG/3ML IN NEBU
0.6300 mg | INHALATION_SOLUTION | Freq: Four times a day (QID) | RESPIRATORY_TRACT | Status: DC | PRN
  Filled 2011-09-25: qty 3

## 2011-09-25 NOTE — Progress Notes (Signed)
[address]            [name] [address]          [phone]     1 Day Post-Op Procedure(s) (LRB): VIDEO ASSISTED THORACOSCOPY (VATS)/ LOBECTOMY (Right)  Subjective: OOB in chair.  Sore, some nausea overnight, but feels a little better this am.  Objective: Vital signs in last 24 hours: Patient Vitals for the past 24 hrs:  BP Temp Temp src Pulse Resp SpO2 Height Weight  09/25/11 0350 120/73 mmHg 98.4 F (36.9 C) Oral 79  16  96 % - -  09/25/11 0325 - - - - 18  96 % - -  09/25/11 0135 - - - 78  15  96 % - -  09/25/11 0000 120/70 mmHg 98.7 F (37.1 C) Oral 82  16  95 % - -  09/24/11 2308 - - - - 18  96 % - -  09/24/11 2000 - - - - 17  98 % - -  09/24/11 1956 - - - 82  14  97 % - -  09/24/11 1950 139/79 mmHg 98.7 F (37.1 C) Oral 86  15  97 % - -  09/24/11 1600 144/78 mmHg 97.2 F (36.2 C) Oral 73  12  100 % - -  09/24/11 1559 - - - - 12  100 % - -  09/24/11 1542 - - - - - 100 % - -  09/24/11 1430 126/84 mmHg 97.3 F (36.3 C) Oral 78  14  99 % 5' 7.5" (1.715 m) 78.6 kg (173 lb 4.5 oz)  09/24/11 1349 141/76 mmHg - - 70  13  100 % - -  09/24/11 1345 - 97.4 F (36.3 C) - - - - - -  09/24/11 1333 139/79 mmHg - - 69  12  100 % - -  09/24/11 1318 135/85 mmHg - - 66  12  100 % - -  09/24/11 1304 132/79 mmHg - - 64  11  100 % - -  09/24/11 1248 138/77 mmHg - - 65  12  100 % - -  09/24/11 1233 135/74 mmHg - - 62  13  100 % - -  09/24/11 1218 130/75 mmHg - - 64  14  100 % - -  09/24/11 1203 127/75 mmHg - - 63  11  100 % - -  09/24/11 1148 129/74 mmHg - - 65  13  100 % - -  09/24/11 1133 127/72 mmHg - - 70  14  99 % - -  09/24/11 1118 148/78 mmHg - - - - - - -  09/24/11 1115 - 96.8 F (36 C) - - - 100 % - -   Current Weight  09/24/11 78.6 kg (173 lb 4.5 oz)     Intake/Output from previous day: 03/18 0701 - 03/19 0700 In: 3594.5 [I.V.:3242.5; IV Piggyback:352] Out: 1727 [Urine:1277; Blood:300; Chest Tube:150]    PHYSICAL  EXAM:  Heart: RRR Lungs: decreased BS bilaterally Wound: dressed and dry Chest tube: 1-2/7 air leak with cough  Lab Results: CBC: Basename 09/25/11 0350  WBC 8.7  HGB 11.2*  HCT 33.2*  PLT 182   BMET:  Basename 09/25/11 0350  NA 135  K 4.0  CL 97  CO2 26  GLUCOSE 141*  BUN 13  CREATININE 0.67  CALCIUM 8.8    PT/INR: No results  found for this basename: LABPROT,INR in the last 72 hours  CXR- ?Small apical ptx, patchy atx Assessment/Plan: S/P Procedure(s) (LRB): VIDEO ASSISTED THORACOSCOPY (VATS)/ LOBECTOMY (Right) Continue CTs to suction. Advance diet as tolerated. Decrease IVF, mobilize, work on pulm toilet.   LOS: 1 day    Lillan Mccreadie H 09/25/2011

## 2011-09-25 NOTE — Progress Notes (Signed)
UR Completed.  Heather [name] [phone] 09/25/2011  

## 2011-09-26 ENCOUNTER — Other Ambulatory Visit: Payer: Self-pay

## 2011-09-26 ENCOUNTER — Inpatient Hospital Stay (HOSPITAL_COMMUNITY): Payer: Medicare Other

## 2011-09-26 LAB — CBC
HCT: 29.3 % — ABNORMAL LOW (ref 36.0–46.0)
MCH: 29.9 pg (ref 26.0–34.0)
MCV: 88.5 fL (ref 78.0–100.0)
RDW: 14.6 % (ref 11.5–15.5)
WBC: 9.6 10*3/uL (ref 4.0–10.5)

## 2011-09-26 LAB — COMPREHENSIVE METABOLIC PANEL
Albumin: 2.9 g/dL — ABNORMAL LOW (ref 3.5–5.2)
BUN: 8 mg/dL (ref 6–23)
Calcium: 8.8 mg/dL (ref 8.4–10.5)
Chloride: 101 mEq/L (ref 96–112)
Creatinine, Ser: 0.68 mg/dL (ref 0.50–1.10)
GFR calc non Af Amer: 86 mL/min — ABNORMAL LOW (ref >90)
Total Bilirubin: 0.6 mg/dL (ref 0.3–1.2)

## 2011-09-26 MED ORDER — DOCUSATE SODIUM 100 MG PO CAPS
200.0000 mg | ORAL_CAPSULE | Freq: Every day | ORAL | Status: DC
  Administered 2011-09-26 – 2011-09-30 (×5): 200 mg via ORAL
  Filled 2011-09-26: qty 1
  Filled 2011-09-26: qty 2
  Filled 2011-09-26 (×2): qty 1
  Filled 2011-09-26 (×3): qty 2

## 2011-09-26 MED ORDER — MAGNESIUM HYDROXIDE 400 MG/5ML PO SUSP: 30.0000 mL | Freq: Every day | ORAL | Status: DC | PRN

## 2011-09-26 MED ORDER — METOPROLOL TARTRATE 25 MG PO TABS
25.0000 mg | ORAL_TABLET | Freq: Two times a day (BID) | ORAL | Status: DC
  Administered 2011-09-26 – 2011-10-01 (×10): 25 mg via ORAL
  Filled 2011-09-26 (×11): qty 1

## 2011-09-26 NOTE — Progress Notes (Addendum)
09/26/11 2040   Upon arriving to 3315 for change of shift report, pt went into what appeared to be Afib/Aflutter but was not sustained. We continued to watch the pt and at around 2000 pt went into Afib/Flutter again. 12 lead EKG was taken and showed NSR with frequent PACs. See chart.   Strips of Afib and Flutter printed and placed in chart.   MD Dorris Fetch was notified and 25mg  Lopressor BID was ordered.   Elisha Headland RN

## 2011-09-26 NOTE — Progress Notes (Signed)
                    [address]            [name] [address]          [phone]     2 Days Post-Op Procedure(s) (LRB): VIDEO ASSISTED THORACOSCOPY (VATS)/ LOBECTOMY (Right)  Subjective: Feels well.  Breathing stable.  Nausea resolved.  C/o constipation.   Objective: Vital signs in last 24 hours: Patient Vitals for the past 24 hrs:  BP Temp Temp src Pulse Resp SpO2  09/26/11 0355 138/76 mmHg 98.6 F (37 C) Oral 73  18  93 %  09/25/11 2330 134/80 mmHg 99 F (37.2 C) Oral 74  18  91 %  09/25/11 2327 - - - - 23  91 %  09/25/11 2010 140/76 mmHg 98.2 F (36.8 C) Oral 77  19  94 %  09/25/11 2000 - - - - 14  94 %  09/25/11 1600 - - - - 19  94 %  09/25/11 1500 144/75 mmHg 99 F (37.2 C) Oral 77  19  94 %  09/25/11 1411 - - - - - 98 %  09/25/11 1158 157/78 mmHg 98 F (36.7 C) Oral 72  18  98 %  09/25/11 1148 - - - - 18  99 %  09/25/11 1147 - - - - 18  99 %  09/25/11 0916 132/67 mmHg 98.2 F (36.8 C) Oral 77  15  96 %  09/25/11 0800 - - - - 15  96 %   Current Weight  09/24/11 78.6 kg (173 lb 4.5 oz)     Intake/Output from previous day: 03/19 0701 - 03/20 0700 In: 2130 [P.O.:480; I.V.:1650] Out: 5115 [Urine:4825; Chest Tube:290]    PHYSICAL EXAM:  Heart: RRR Lungs: clear Wound: dressed and dry Chest tube: no obvious air leak  Lab Results: CBC: Basename 09/26/11 0400 09/25/11 0350  WBC 9.6 8.7  HGB 9.9* 11.2*  HCT 29.3* 33.2*  PLT 177 182   BMET:  Basename 09/26/11 0400 09/25/11 0350  NA 136 135  K 4.1 4.0  CL 101 97  CO2 27 26  GLUCOSE 115* 141*  BUN 8 13  CREATININE 0.68 0.67  CALCIUM 8.8 8.8    PT/INR: No results found for this basename: LABPROT,INR in the last 72 hours  CXR- stable tiny R ptx  Assessment/Plan: S/P Procedure(s) (LRB): VIDEO ASSISTED THORACOSCOPY (VATS)/ LOBECTOMY (Right) CTs with no air leak, CXR stable.  Possibly can d/c 1 CT today.  Will place CTs to H2O seal. Mobilize, continue pulm toilet.  LOC today.    Pt requests Foley be left in today until she is not as weak when getting up and around.  Will leave until seen by MD. Pt is edentulous and can't eat regular consistency diet.  Will change to soft diet.   LOS: 2 days    Heather [name] H 09/26/2011

## 2011-09-27 ENCOUNTER — Inpatient Hospital Stay (HOSPITAL_COMMUNITY): Payer: Medicare Other

## 2011-09-27 NOTE — Progress Notes (Signed)
3 Days Post-Op Procedure(s) (LRB): VIDEO ASSISTED THORACOSCOPY (VATS)/ LOBECTOMY (Right) Subjective: Feels better this AM  Objective: Vital signs in last 24 hours: Temp:  [98.2 F (36.8 C)-98.9 F (37.2 C)] 98.2 F (36.8 C) (03/21 0800) Pulse Rate:  [63-76] 63  (03/21 0800) Cardiac Rhythm:  [-] Normal sinus rhythm (03/21 0800) Resp:  [15-22] 17  (03/21 0845) BP: (115-134)/(58-86) 134/81 mmHg (03/21 0800) SpO2:  [92 %-98 %] 98 % (03/21 0845)  Hemodynamic parameters for last 24 hours:    Intake/Output from previous day: 03/20 0701 - 03/21 0700 In: 460 [I.V.:460] Out: 2340 [Urine:2200; Chest Tube:140] Intake/Output this shift: Total I/O In: -  Out: 90 [Chest Tube:90]  General appearance: alert and no distress Neurologic: intact Heart: regular rate and rhythm Lungs: diminished breath sounds base - right Intermittent air leak  Lab Results:  Basename 09/26/11 0400 09/25/11 0350  WBC 9.6 8.7  HGB 9.9* 11.2*  HCT 29.3* 33.2*  PLT 177 182   BMET:  Basename 09/26/11 0400 09/25/11 0350  NA 136 135  K 4.1 4.0  CL 101 97  CO2 27 26  GLUCOSE 115* 141*  BUN 8 13  CREATININE 0.68 0.67  CALCIUM 8.8 8.8    PT/INR: No results found for this basename: LABPROT,INR in the last 72 hours ABG    Component Value Date/Time   PHART 7.438* 09/25/2011 0423   HCO3 28.1* 09/25/2011 0423   TCO2 29.4 09/25/2011 0423   O2SAT 96.0 09/25/2011 0423   CBG (last 3)  No results found for this basename: GLUCAP:3 in the last 72 hours  Assessment/Plan: S/P Procedure(s) (LRB): VIDEO ASSISTED THORACOSCOPY (VATS)/ LOBECTOMY (Right) - CXR shows right pneumothorax, CT has respiratory variation and intermittent air leak- will place back to suction for today Transient a fib last PM, started on lopressor, in SR this AM Ambulate DVT prophylaxis   LOS: 3 days    Heather [name] C 09/27/2011

## 2011-09-27 NOTE — Progress Notes (Signed)
Patient went into Afib/flutter on the monitor at 23:40.  Rate controlled at present time.  Will continue to monitor.  Vivi Martens RN

## 2011-09-28 ENCOUNTER — Inpatient Hospital Stay (HOSPITAL_COMMUNITY): Payer: Medicare Other

## 2011-09-28 NOTE — Progress Notes (Addendum)
Subjective:   Heather [name] complains of pain along her right side  Objective:  Vital Signs in the last 24 hours: Temp:  [97.4 F (36.3 C)-99.4 F (37.4 C)] 98.6 F (37 C) (03/22 0420) Pulse Rate:  [61-97] 70  (03/22 0420) Resp:  [16-30] 30  (03/22 0420) BP: (104-134)/(63-89) 110/63 mmHg (03/22 0420) SpO2:  [93 %-97 %] 95 % (03/22 0420)  Intake/Output from previous day: 03/21 0701 - 03/22 0700 In: 680 [P.O.:240; I.V.:440] Out: 1065 [Urine:875; Chest Tube:190] Intake/Output from this shift:    Physical Exam: General appearance: alert and no distress Lungs: diminished breath sounds RLL Heart: regular rate and rhythm Abdomen: soft, non-tender; bowel sounds normal; no masses,  no organomegaly Skin: incisons healing well  Lab Results:  Basename 09/26/11 0400  WBC 9.6  HGB 9.9*  PLT 177    Basename 09/26/11 0400  NA 136  K 4.1  CL 101  CO2 27  GLUCOSE 115*  BUN 8  CREATININE 0.68   No results found for this basename: TROPONINI:2,CK,MB:2 in the last 72 hours Hepatic Function Panel  Basename 09/26/11 0400  PROT 6.3  ALBUMIN 2.9*  AST 16  ALT 10  ALKPHOS 82  BILITOT 0.6  BILIDIR --  IBILI --   No results found for this basename: CHOL in the last 72 hours No results found for this basename: PROTIME in the last 72 hours  Imaging: Imaging results have been reviewed    Assessment/Plan:   1. S/P Right VATS/RUL 2. CV- patient NSR this AM will continue Metoprolol  3. Resp- CT remains in place, 24 hour output 140, no air leak appreciated this morning, chest tube remains on suction-management per Dr. Dorris Fetch 4. Pathology comes back positive for moderately differentiated Adenocarcinoma of the lung with invasion of bronchial wall,  negative lymph node    LOS: 4 days    [name], Heather 09/28/2011, 8:49 AM    Patient seen and examined. Agree with above. No air leak will try on water seal, hopefully can d/c Ct tomorrow

## 2011-09-29 ENCOUNTER — Inpatient Hospital Stay (HOSPITAL_COMMUNITY): Payer: Medicare Other

## 2011-09-29 NOTE — Progress Notes (Addendum)
                    [address]            [name] [address]          [phone]     5 Days Post-Op Procedure(s) (LRB): VIDEO ASSISTED THORACOSCOPY (VATS)/ LOBECTOMY (Right)  Subjective: OOB in chair.  No complaints.  Objective: Vital signs in last 24 hours: Patient Vitals for the past 24 hrs:  BP Temp Temp src Pulse Resp SpO2  09/29/11 1006 121/68 mmHg - - 81  - -  09/29/11 0800 123/67 mmHg 97.9 F (36.6 C) Oral 73  18  92 %  09/29/11 0515 - - - - 16  97 %  09/29/11 0431 - - - - 24  -  09/29/11 0418 119/78 mmHg 98.7 F (37.1 C) Oral 71  19  97 %  09/29/11 0105 - - - - 22  -  09/29/11 0004 125/74 mmHg 98.6 F (37 C) Oral 81  18  95 %  09/28/11 2252 126/72 mmHg - - 106  - -  09/28/11 2026 130/55 mmHg 98.8 F (37.1 C) Oral 83  17  94 %  09/28/11 1600 125/62 mmHg - - - - -  09/28/11 1200 123/75 mmHg - - - - -   Current Weight  09/24/11 78.6 kg (173 lb 4.5 oz)     Intake/Output from previous day: 03/22 0701 - 03/23 0700 In: 440 [I.V.:440] Out: 646 [Urine:526; Chest Tube:120]    PHYSICAL EXAM:  Heart: RRR Lungs: clear Wound: clean and dry Chest tube: no air leak  Lab Results: CBC:No results found for this basename: WBC:2,HGB:2,HCT:2,PLT:2 in the last 72 hours BMET: No results found for this basename: NA:2,K:2,CL:2,CO2:2,GLUCOSE:2,BUN:2,CREATININE:2,CALCIUM:2 in the last 72 hours  PT/INR: No results found for this basename: LABPROT,INR in the last 72 hours  CXR stable, decreased R ptx  Assessment/Plan: S/P Procedure(s) (LRB): VIDEO ASSISTED THORACOSCOPY (VATS)/ LOBECTOMY (Right) Hopefully can d/c CT today. Pt lives with her cousin, but feels she may need HHRN, etc at d/c. Will have CM see for d/c planning. Possibly home Mon/Tues.   LOS: 5 days    COLLINS,GINA H 09/29/2011   I have seen and examined the patient and agree with the assessment and plan as outlined.  D/C chest tube  Chen Holzman H 09/29/2011 2:23 PM

## 2011-09-29 NOTE — Progress Notes (Signed)
09/29/2011 1630 Notified MD of patients chest xray results. VSS. No orders received. Will continue to monitor. Celesta Gentile

## 2011-09-30 ENCOUNTER — Inpatient Hospital Stay (HOSPITAL_COMMUNITY): Payer: Medicare Other

## 2011-09-30 MED ORDER — OXYCODONE-ACETAMINOPHEN 5-325 MG PO TABS: 1.0000 | ORAL_TABLET | ORAL | Status: DC | PRN

## 2011-09-30 MED ORDER — METOPROLOL TARTRATE 25 MG PO TABS: 25.0000 mg | ORAL_TABLET | Freq: Two times a day (BID) | ORAL | Status: DC

## 2011-09-30 MED ORDER — POLYETHYLENE GLYCOL 3350 17 G PO PACK
17.0000 g | PACK | Freq: Every day | ORAL | Status: DC
  Administered 2011-09-30: 17 g via ORAL
  Filled 2011-09-30 (×2): qty 1

## 2011-09-30 NOTE — Progress Notes (Addendum)
                    [address]            [name] [address]          [phone]     6 Days Post-Op Procedure(s) (LRB): VIDEO ASSISTED THORACOSCOPY (VATS)/ LOBECTOMY (Right)  Subjective: Sore, no other complaints.  Walked in halls yesterday without difficulty. Wants something for constipation.  Objective: Vital signs in last 24 hours: Patient Vitals for the past 24 hrs:  BP Temp Temp src Pulse Resp SpO2  09/30/11 0409 106/91 mmHg 98.8 F (37.1 C) Oral 70  16  91 %  09/30/11 0400 - - - - 16  92 %  09/30/11 0000 - - - 75  17  90 %  09/29/11 2300 103/82 mmHg 98.6 F (37 C) Oral 80  17  92 %  09/29/11 2111 123/70 mmHg - - 76  - -  09/29/11 2000 - - - 76  16  95 %  09/29/11 1900 120/63 mmHg 98.8 F (37.1 C) Oral 79  17  94 %  09/29/11 1600 117/66 mmHg 98.5 F (36.9 C) Oral - 12  97 %  09/29/11 1200 115/79 mmHg 98.2 F (36.8 C) Oral 74  95  94 %  09/29/11 1006 121/68 mmHg - - 81  - -  09/29/11 0800 123/67 mmHg 97.9 F (36.6 C) Oral 73  18  92 %   Current Weight  09/24/11 78.6 kg (173 lb 4.5 oz)     Intake/Output from previous day: 03/23 0701 - 03/24 0700 In: 568.5 [I.V.:568.5] Out: 860 [Urine:800; Chest Tube:60]    PHYSICAL EXAM:  Heart: RRR Lungs: slightly decreased BS in bases Wound: clean and dry   Lab Results: CBC:No results found for this basename: WBC:2,HGB:2,HCT:2,PLT:2 in the last 72 hours BMET: No results found for this basename: NA:2,K:2,CL:2,CO2:2,GLUCOSE:2,BUN:2,CREATININE:2,CALCIUM:2 in the last 72 hours  PT/INR: No results found for this basename: LABPROT,INR in the last 72 hours  CXR not performed yet this morning.    Assessment/Plan: S/P Procedure(s) (LRB): VIDEO ASSISTED THORACOSCOPY (VATS)/ LOBECTOMY (Right) Will check am CXR.  Yesterday's film showed slightly increased R ptx after CT d/c'ed. D/C PCA, saline lock IVF. LOC today. Possibly home in am if XR ok and pt remains stable.   LOS: 6 days    COLLINS,GINA  H 09/30/2011   I have seen and examined the patient and agree with the assessment and plan as outlined.  CXR looks good with expected right apical space.  Rawn Quiroa H 09/30/2011 1:02 PM

## 2011-09-30 NOTE — Progress Notes (Signed)
09/30/2011 8:13 AM Dc'ed IVF and PCA. Wasted 9ml of Fentanyl in med room with Carmon Ginsberg, RN. Celesta Gentile

## 2011-09-30 NOTE — Discharge Summary (Signed)
[address]            [name] [address]          [phone]         Discharge Summary  Name: Heather [name] DOB: 11/16/1940 71 y.o. MRN: [id number]  Admission Date: 09/24/2011 Discharge Date:    Admitting Diagnosis: Right upper lobe lung mass  Discharge Diagnosis:  Invasive moderately differentiated adenocarcinoma (T1b, N0)  Procedures: Procedure(s): VIDEO ASSISTED THORACOSCOPY (VATS)/ LOBECTOMY on 09/24/2011   HPI:  The patient is a 71 y.o. female who was referred by Dr. Felecia Shelling for evaluation of a right upper lobe lung mass. She initially presented with complaints of right arm pain and underwent a chest x-ray and a CT of the chest which showed a 1.7 x 1.2 cm mass in the central portion of the right upper lobe. She was referred to Dr. Dorris Fetch for further evaluation. She underwent a PET scan on 09/05/2011 which showed marked hypermetabolic activity in the right upper lobe mass. There was also a second area in the right upper lobe which showed increased uptake but was felt to represent post obstructive pneumonitis. There was no other area of hypermetabolic activity and no evidence of metastatic disease. He recommended proceeding with the thoracotomy for resection at this time. All risks, benefits and alternatives of surgery were explained in detail, and the patient agreed to proceed.    Hospital Course:  The patient was admitted to [facility] on 3/18/2013The patient was taken to the operating room and underwent the above procedure.    The postoperative course was notable for transient atrial fibrillation which converted spontaneously to normal sinus rhythm . She was started on a later blocker and has remained in sinus rhythm since that time. Her chest tubes have been removed in the standard fashion and a followup chest x-ray has remained stable. She has remained afebrile and vital signs stable. She has been weaned from supplemental oxygen. She  is ambulating in the halls without difficulty. She's tolerating a regular diet. We anticipate discharge within the next 24 hours if she remains stable.  Pathology was positive for invasive moderately differentiated adenocarcinoma.   Recent vital signs:  Filed Vitals:   09/30/11 1200  BP: 130/65  Pulse: 75  Temp: 98 F (36.7 C)  Resp: 14    Recent laboratory studies:  CBC:No results found for this basename: WBC:2,HGB:2,HCT:2,PLT:2 in the last 72 hours BMET: No results found for this basename: NA:2,K:2,CL:2,CO2:2,GLUCOSE:2,BUN:2,CREATININE:2,CALCIUM:2 in the last 72 hours  PT/INR: No results found for this basename: LABPROT,INR in the last 72 hours  Discharge Medications:   Medication List  As of 09/30/2011 12:13 PM   TAKE these medications         glucosamine-chondroitin 500-400 MG tablet   Take 1 tablet by mouth daily.      metoprolol tartrate 25 MG tablet   Commonly known as: LOPRESSOR   Take 1 tablet (25 mg total) by mouth 2 (two) times daily.      OVER THE COUNTER MEDICATION   Take 1 tablet by mouth daily. Takes women's essential with iron      oxyCODONE-acetaminophen 5-325 MG per tablet   Commonly known as: PERCOCET   Take 1-2 tablets by mouth every 4 (four) hours as needed for pain.      vitamin B-12 250 MCG tablet   Commonly known as: CYANOCOBALAMIN  Take 250 mcg by mouth daily.            Discharge Instructions:  The patient is to refrain from driving, heavy lifting or strenuous activity.  May shower daily and clean incisions with soap and water.  May resume regular diet.    Follow-up Information    Follow up with Yoel Kaufhold C, MD in 2 weeks. (office will call with an appointment)    Contact information:   [address] [place] [address] [phone]           [name] 09/30/2011, 12:13 PM

## 2011-10-01 ENCOUNTER — Inpatient Hospital Stay (HOSPITAL_COMMUNITY): Payer: Medicare Other

## 2011-10-01 MED ORDER — METOPROLOL TARTRATE 25 MG PO TABS: 25.0000 mg | ORAL_TABLET | Freq: Two times a day (BID) | ORAL | Status: DC

## 2011-10-01 MED ORDER — OXYCODONE-ACETAMINOPHEN 5-325 MG PO TABS: 1.0000 | ORAL_TABLET | ORAL | Status: AC | PRN

## 2011-10-01 NOTE — Progress Notes (Signed)
CL d/c; Pt tolerated. Discharge info reviewed. Pt d/c home this afternoon; VSS.

## 2011-10-01 NOTE — Discharge Instructions (Signed)
ACTIVITY:  1.Increase activity slowly. 2.Walk daily and increase frequency and duration as tolerates. 3.May walk up steps. 4.No lifting more than ten pounds for two weeks. 5.No driving for two weeks. 6.Avoid straining. 7.STOP any activity that causes chest pain, shortness of breath, dizziness,sweating, or excessive weakness. 8.Continue with breathing exercises daily.  DIET:Regular  WOUND:  1.May shower. Thoracotomy Care After Refer to this sheet in the next few weeks. These instructions provide you with information on caring for yourself after your procedure. Your caregiver may also give you more specific instructions. Your treatment has been planned according to current medical practices, but problems sometimes occur. Call your caregiver if you have any problems or questions after your procedure. HOME CARE INSTRUCTIONS  Remove the bandage (dressing) over your chest tube site as directed by your caregiver.   It is normal to be sore for a couple weeks following surgery. See your caregiver if this seems to be getting worse rather than better.   Only take over-the-counter or prescription medicines for pain, discomfort, or fever as directed by your caregiver. It is very important to take pain medicine when you need it so that you will cough and breathe deeply enough to clear mucus (phlegm) and expand your lungs. This helps prevent a lung infection (pneumonia).   If it hurts to cough, hold a pillow against your chest when you cough. This may help with the discomfort. In spite of the discomfort, cough frequently.   Taking deep breaths keeps lungs inflated and protects against pneumonia. Most patients will go home with a device called an incentive spirometer that encourages deep breathing.   You may resume a normal diet and activities as directed.   Use showers for bathing until you see your caregiver, or as instructed.   Change dressings if necessary or as directed.   Avoid lifting or  driving until you are instructed otherwise.   Make an appointment to see your caregiver for stitch (suture) or staple removal when instructed.   Do not travel by airplane for 2 weeks after the chest tube is removed.  SEEK MEDICAL CARE IF:  You are bleeding from your wounds.   Your heartbeat seems irregular.   You have redness, swelling, or increasing pain in the wounds.   There is pus coming from your wounds.   There is a bad smell coming from the wound or dressing.  SEEK IMMEDIATE MEDICAL CARE IF:  You have a fever.   You develop a rash.   You have difficulty breathing.   You develop any reaction or side effects to medicines given.   You develop lightheadedness or feel faint.   You develop shortness of breath or chest pain.  MAKE SURE YOU:  Understand these instructions.   Will watch your condition.   Will get help right away if you are not doing well or get worse.  Document Released: 12/08/2010 Document Revised: 06/14/2011 Document Reviewed: 12/08/2010 [facility] Patient Information 2012 [place], [place]. 2.Clean wounds with mild soap and water.  Call the office at [phone] if any  problems arise.

## 2011-10-01 NOTE — Progress Notes (Signed)
7 Days Post-Op Procedure(s) (LRB): VIDEO ASSISTED THORACOSCOPY (VATS)/ LOBECTOMY (Right) Subjective: No complaints  Objective: Vital signs in last 24 hours: Temp:  [97.4 F (36.3 C)-98.7 F (37.1 C)] 97.4 F (36.3 C) (03/25 0800) Pulse Rate:  [66-87] 66  (03/25 0315) Cardiac Rhythm:  [-] Normal sinus rhythm (03/25 0800) Resp:  [14-20] 18  (03/25 0315) BP: (112-130)/(63-89) 129/65 mmHg (03/25 0800) SpO2:  [92 %-99 %] 92 % (03/25 0315)  Hemodynamic parameters for last 24 hours:    Intake/Output from previous day: 03/24 0701 - 03/25 0700 In: 360 [P.O.:360] Out: 1050 [Urine:1050] Intake/Output this shift:    General appearance: alert and no distress Heart: regular rate and rhythm Lungs: clear to auscultation bilaterally Wound: clean and dry  Lab Results: No results found for this basename: WBC:2,HGB:2,HCT:2,PLT:2 in the last 72 hours BMET: No results found for this basename: NA:2,K:2,CL:2,CO2:2,GLUCOSE:2,BUN:2,CREATININE:2,CALCIUM:2 in the last 72 hours  PT/INR: No results found for this basename: LABPROT,INR in the last 72 hours ABG    Component Value Date/Time   PHART 7.438* 09/25/2011 0423   HCO3 28.1* 09/25/2011 0423   TCO2 29.4 09/25/2011 0423   O2SAT 96.0 09/25/2011 0423   CBG (last 3)  No results found for this basename: GLUCAP:3 in the last 72 hours  CXR- small apical space   Assessment/Plan: S/P Procedure(s) (LRB): VIDEO ASSISTED THORACOSCOPY (VATS)/ LOBECTOMY (Right) Plan for discharge: see discharge orders   LOS: 7 days    Vicky Mccanless C 10/01/2011

## 2011-10-02 NOTE — Progress Notes (Signed)
   CARE MANAGEMENT NOTE 10/02/2011  Patient:  Heather [name], Heather [name]   Account Number:  [id number]  Date Initiated:  09/25/2011  Documentation initiated by:  [facility]  Subjective/Objective Assessment:   postop  VATS and lobectomy.  Lives with family.     Action/Plan:   PTA, PT LIVES WITH COUSIN, WHO CAN PROVIDE CARE AT DISCHARGE.  SHE DENIES ANY NEEDS FOR HOME.   Anticipated DC Date:  10/02/2011   Anticipated DC Plan:  HOME W HOME HEALTH SERVICES      DC Planning Services  CM consult      Choice offered to / List presented to:             Status of service:  Completed, signed off Medicare Important Message given?   (If response is "NO", the following Medicare IM given date fields will be blank) Date Medicare IM given:   Date Additional Medicare IM given:    Discharge Disposition:  HOME/SELF CARE  Per UR Regulation:  Reviewed for med. necessity/level of care/duration of stay  If discussed at Long Length of Stay Meetings, dates discussed:    Comments:

## 2011-10-12 ENCOUNTER — Other Ambulatory Visit: Payer: Self-pay | Admitting: Thoracic Surgery (Cardiothoracic Vascular Surgery)

## 2011-10-12 DIAGNOSIS — D381 Neoplasm of uncertain behavior of trachea, bronchus and lung: Secondary | ICD-10-CM

## 2011-10-18 ENCOUNTER — Ambulatory Visit (INDEPENDENT_AMBULATORY_CARE_PROVIDER_SITE_OTHER): Payer: Self-pay | Admitting: Thoracic Surgery (Cardiothoracic Vascular Surgery)

## 2011-10-18 ENCOUNTER — Encounter: Payer: Self-pay | Admitting: Thoracic Surgery (Cardiothoracic Vascular Surgery)

## 2011-10-18 ENCOUNTER — Ambulatory Visit
Admission: RE | Admit: 2011-10-18 | Discharge: 2011-10-18 | Disposition: A | Discharge status: Home / Self Care | Payer: Medicare Other | Source: Ambulatory Visit | Attending: Thoracic Surgery (Cardiothoracic Vascular Surgery) | Admitting: Thoracic Surgery (Cardiothoracic Vascular Surgery)

## 2011-10-18 VITALS — BP 150/85 | HR 82 | Resp 20 | Ht 70.5 in | Wt 166.0 lb

## 2011-10-18 DIAGNOSIS — C3490 Malignant neoplasm of unspecified part of unspecified bronchus or lung: Secondary | ICD-10-CM

## 2011-10-18 DIAGNOSIS — Z902 Acquired absence of lung [part of]: Secondary | ICD-10-CM

## 2011-10-18 DIAGNOSIS — D381 Neoplasm of uncertain behavior of trachea, bronchus and lung: Secondary | ICD-10-CM

## 2011-10-18 DIAGNOSIS — Z9889 Other specified postprocedural states: Secondary | ICD-10-CM

## 2011-10-18 NOTE — Progress Notes (Signed)
  HPI:  Mrs. Debo returns today for followup. She had a right VATS upper lobectomy on 09/24/2011. She had some transient atrial fibrillation postoperatively which was treated with beta blockers. She was discharged on postoperative day #7. Says she's been doing well but she is having some pain and has used up all of her oxycodone. She says she had her oxycodone she would be using a once or twice a day. She denies any difficulty with her breathing.  Past Medical History  Diagnosis Date  . Tobacco abuse   . Lung nodule   . Arthritis   . Psoriasis     Ankles  . Constipation     corrects with diet  . Floater, vitreous     Right    Current Outpatient Prescriptions  Medication Sig Dispense Refill  . calcium-vitamin D 250-100 MG-UNIT per tablet Take 1 tablet by mouth 1 day or 1 dose.      Marland Kitchen glucosamine-chondroitin 500-400 MG tablet Take 1 tablet by mouth daily.      . Iron-Vitamin C 100-250 MG TABS Take by mouth 1 day or 1 dose.      . metoprolol tartrate (LOPRESSOR) 25 MG tablet Take 1 tablet (25 mg total) by mouth 2 (two) times daily.  60 tablet  1  . OVER THE COUNTER MEDICATION Take 1 tablet by mouth daily. Takes women's essential with iron      . oxyCODONE-acetaminophen (PERCOCET) 5-325 MG per tablet Take 1 tablet by mouth every 4 (four) hours as needed.      . vitamin B-12 (CYANOCOBALAMIN) 250 MCG tablet Take 250 mcg by mouth daily.        Physical Exam BP 150/85  Pulse 82  Resp 20  Ht 5' 10.5" (1.791 m)  Wt 166 lb (75.297 kg)  BMI 23.48 kg/m2  SpO2 97% General well-developed well-nourished no acute distress Lungs clear with essentially equal breath sounds bilaterally Cardiac regular rate and rhythm Incisions clean dry and intact  Diagnostic Tests: Chest x-ray shows good aeration of the right lung with some postsurgical changes  Impression: 3 weeks out from right VATS lobectomy for stage IB non-small cell carcinoma. Overall I think doing very well at this point in time. I  gave her prescription for Vicodin 5/325 one to 2 tablets 3 times daily as needed for pain, 40 tablets no refills. She may begin driving but I cautioned her strongly not to drive while taking the Vicodin and she has no plans to do so. There is no restrictions on her activities but did encourage her to start small and filled into her activities gradually.\  Plan: Return in 3 months. We'll do a CT of the chest at that time.  She knows to call if she has any other issues in the interim

## 2011-11-07 ENCOUNTER — Other Ambulatory Visit: Payer: Self-pay | Admitting: *Deleted

## 2011-11-07 DIAGNOSIS — G8918 Other acute postprocedural pain: Secondary | ICD-10-CM

## 2011-11-07 MED ORDER — HYDROCODONE-ACETAMINOPHEN 5-500 MG PO TABS: 1.0000 | ORAL_TABLET | Freq: Three times a day (TID) | ORAL | Status: AC | PRN

## 2011-12-13 ENCOUNTER — Telehealth: Payer: Self-pay

## 2011-12-13 NOTE — Telephone Encounter (Signed)
Called pt to schedule screening colonoscopy. She said she is not ready to schedule yet and she will call when ready.

## 2011-12-14 ENCOUNTER — Other Ambulatory Visit: Payer: Self-pay | Admitting: *Deleted

## 2011-12-18 ENCOUNTER — Telehealth: Payer: Self-pay

## 2011-12-18 NOTE — Telephone Encounter (Signed)
Pt called to schedule colonoscopy (referred by Dr. Felecia Shelling ). Has constipation. OV with KJ on 01/07/2012 2 10:30 AM.

## 2011-12-31 ENCOUNTER — Other Ambulatory Visit: Payer: Self-pay | Admitting: Thoracic Surgery (Cardiothoracic Vascular Surgery)

## 2011-12-31 DIAGNOSIS — C3490 Malignant neoplasm of unspecified part of unspecified bronchus or lung: Secondary | ICD-10-CM

## 2012-01-07 ENCOUNTER — Other Ambulatory Visit: Payer: Self-pay | Admitting: Gastroenterology

## 2012-01-07 ENCOUNTER — Encounter (HOSPITAL_COMMUNITY): Payer: Self-pay | Admitting: Pharmacy Technician

## 2012-01-07 ENCOUNTER — Ambulatory Visit (INDEPENDENT_AMBULATORY_CARE_PROVIDER_SITE_OTHER): Payer: Medicare Other | Admitting: Urgent Care

## 2012-01-07 ENCOUNTER — Encounter: Payer: Self-pay | Admitting: Urgent Care

## 2012-01-07 VITALS — BP 135/86 | HR 77 | Temp 98.4°F | Ht 68.0 in | Wt 165.4 lb

## 2012-01-07 DIAGNOSIS — K5900 Constipation, unspecified: Secondary | ICD-10-CM

## 2012-01-07 DIAGNOSIS — Z1211 Encounter for screening for malignant neoplasm of colon: Secondary | ICD-10-CM

## 2012-01-07 DIAGNOSIS — Z7189 Other specified counseling: Secondary | ICD-10-CM | POA: Insufficient documentation

## 2012-01-07 MED ORDER — POLYETHYLENE GLYCOL 3350 17 GM/SCOOP PO POWD: 17.0000 g | Freq: Every day | ORAL | Status: AC

## 2012-01-07 NOTE — Progress Notes (Signed)
Faxed to PCP

## 2012-01-07 NOTE — Assessment & Plan Note (Signed)
Screening colonoscopy with Dr. Darrick Penna.  I have discussed risks & benefits which include, but are not limited to, bleeding, infection, perforation & drug reaction.  The patient agrees with this plan & written consent will be obtained.

## 2012-01-07 NOTE — Patient Instructions (Addendum)
I sent prescription for MiraLax to Heather [name]'s pharmacy or you can get at any drugstore. Begin MiraLax 17 g daily for constipation. He should not take on the days that you have diarrhea. You need a colonoscopy with Dr. Darrick Penna  Constipation in Adults Constipation is having fewer than 2 bowel movements per week. Usually, the stools are hard. As we grow older, constipation is more common. If you try to fix constipation with laxatives, the problem may get worse. This is because laxatives taken over a long period of time make the colon muscles weaker. A low-fiber diet, not taking in enough fluids, and taking some medicines may make these problems worse. MEDICATIONS THAT MAY CAUSE CONSTIPATION  Water pills (diuretics).   Calcium channel blockers (used to control blood pressure and for the heart).   Certain pain medicines (narcotics).   Anticholinergics.   Anti-inflammatory agents.   Antacids that contain aluminum.  DISEASES THAT CONTRIBUTE TO CONSTIPATION  Diabetes.   Parkinson's disease.   Dementia.   Stroke.   Depression.   Illnesses that cause problems with salt and water metabolism.  HOME CARE INSTRUCTIONS   Constipation is usually best cared for without medicines. Increasing dietary fiber and eating more fruits and vegetables is the best way to manage constipation.   Slowly increase fiber intake to 25 to 38 grams per day. Whole grains, fruits, vegetables, and legumes are good sources of fiber. A dietitian can further help you incorporate high-fiber foods into your diet.   Drink enough water and fluids to keep your urine clear or pale yellow.   A fiber supplement may be added to your diet if you cannot get enough fiber from foods.   Increasing your activities also helps improve regularity.   Suppositories, as suggested by your caregiver, will also help. If you are using antacids, such as aluminum or calcium containing products, it will be helpful to switch to products  containing magnesium if your caregiver says it is okay.   If you have been given a liquid injection (enema) today, this is only a temporary measure. It should not be relied on for treatment of longstanding (chronic) constipation.   Stronger measures, such as magnesium sulfate, should be avoided if possible. This may cause uncontrollable diarrhea. Using magnesium sulfate may not allow you time to make it to the bathroom.  SEEK IMMEDIATE MEDICAL CARE IF:   There is bright red blood in the stool.   The constipation stays for more than 4 days.   There is belly (abdominal) or rectal pain.   You do not seem to be getting better.   You have any questions or concerns.  MAKE SURE YOU:   Understand these instructions.   Will watch your condition.   Will get help right away if you are not doing well or get worse.  Document Released: 03/23/2004 Document Revised: 06/14/2011 Document Reviewed: 05/29/2011 [facility] Patient Information 2012 [place], [place].

## 2012-01-07 NOTE — Assessment & Plan Note (Addendum)
Heather [name] is a pleasant 71 y.o. female with chronic constipation without alarm features. She is due for screening colonoscopy.   Begin MiraLax 17 g daily for constipation. Hold if diarrhea. Constipation literature

## 2012-01-07 NOTE — Progress Notes (Signed)
Referring Provider: Avon Gully, MD Primary Care Physician:  Avon Gully, MD Primary Gastroenterologist:  Dr. Jonette Eva  Chief Complaint  Patient presents with  . Colonoscopy  . Constipation    HPI:  Heather [name] is a 71 y.o. female here as a referral from Dr. Felecia Shelling for screening colonosocpy.  Upon triaging by our nurse, she gave history of constipation. She was brought into the office for further evaluation prior to colonoscopy. He tells me today she has had lifelong constipation.  She has previously taken colon cleanse w/ good results.  She is currently not taking anything for constipation.  She is having bowel movements every other day at this point.   He also tells me her teeth are in poor repair so she eats a lot of liquids.  She working with her dentist on this and has had a recent dental abscess requiring antibiotics.  She hopes to possibly get dentures in the near future.  Denies rectal beeding or melena.    Past Medical History  Diagnosis Date  . Tobacco abuse   . Nonsquamous nonsmall cell neoplasm of lung 09/2011  . Arthritis   . Psoriasis     Ankles  . Constipation     corrects with diet  . Floater, vitreous     Right  . Atrial fib/flutter, transient     post-op    Past Surgical History  Procedure Date  . Right vats (upper lobectomy) 08/2011    Dr Dorris Fetch    Current Outpatient Prescriptions  Medication Sig Dispense Refill  . calcium-vitamin D 250-100 MG-UNIT per tablet Take 1 tablet by mouth 1 day or 1 dose.      Marland Kitchen glucosamine-chondroitin 500-400 MG tablet Take 1 tablet by mouth daily.      . metoprolol tartrate (LOPRESSOR) 25 MG tablet Take 1 tablet (25 mg total) by mouth 2 (two) times daily.  60 tablet  1  . OVER THE COUNTER MEDICATION Take 1 tablet by mouth daily. Takes women's essential with iron      . oxyCODONE-acetaminophen (PERCOCET) 5-325 MG per tablet Take 1 tablet by mouth every 4 (four) hours as needed.      . penicillin v potassium (VEETID)  500 MG tablet Take 500 mg by mouth 4 (four) times daily.       . polyethylene glycol powder (MIRALAX) powder Take 17 g by mouth daily.  527 g  11    Allergies as of 01/07/2012 - Review Complete 01/07/2012  Allergen Reaction Noted  . Ibuprofen Nausea And Vomiting 01/07/2012    Family History:There is no known family history of colorectal carcinoma , liver disease, or inflammatory bowel disease.  Problem Relation Age of Onset  . Hypertension Mother   . Heart disease Mother   . Heart disease Sister   . Heart disease Brother     History   Social History  . Marital Status: Widowed    Spouse Name: N/A    Number of Children: 3  . Years of Education: N/A   Occupational History  . retired    Social History Main Topics  . Smoking status: Former Smoker -- 0.3 packs/day for 20 years    Types: Cigarettes    Quit date: 08/17/2011  . Smokeless tobacco: Never Used  . Alcohol Use: No  . Drug Use: No  . Sexually Active: Not on file   Other Topics Concern  . Not on file   Social History Narrative   Lives w/ cousin  Review of  Systems: Gen: Denies any fever, chills, sweats, anorexia, fatigue, weakness, malaise, weight loss, and sleep disorder CV: Denies chest pain, angina, palpitations, syncope, orthopnea, PND, peripheral edema, and claudication. Resp: Denies dyspnea at rest, dyspnea with exercise, cough, sputum, wheezing, coughing up blood, and pleurisy. GI: Denies vomiting blood, jaundice, and fecal incontinence.    GU : Denies urinary burning, blood in urine, urinary frequency, urinary hesitancy, nocturnal urination, and urinary incontinence. MS: Denies joint pain, limitation of movement, and swelling, stiffness, low back pain, extremity pain. Denies muscle weakness, cramps, atrophy.  Derm: Denies rash, itching, dry skin, hives, moles, warts, or unhealing ulcers.  Psych: Denies depression, anxiety, memory loss, suicidal ideation, hallucinations, paranoia, and confusion. Heme: Denies  bruising, bleeding, and enlarged lymph nodes. Neuro:  Denies any headaches, dizziness, paresthesias. Endo:  Denies any problems with DM, thyroid, adrenal function.  Physical Exam: BP 135/86  Pulse 77  Temp 98.4 F (36.9 C) (Temporal)  Ht 5\' 8"  (1.727 m)  Wt 165 lb 6.4 oz (75.025 kg)  BMI 25.15 kg/m2 General:   Alert,  Well-developed, well-nourished, pleasant and cooperative in NAD Head:  Normocephalic and atraumatic. Eyes:  Sclera clear, no icterus.   Conjunctiva pink. Ears:  Normal auditory acuity. Nose:  No deformity, discharge, or lesions. Mouth:  No deformity or lesions,oropharynx pink & moist. Neck:  Supple; no masses or thyromegaly. Lungs:  Clear throughout to auscultation.   No wheezes, crackles, or rhonchi. No acute distress. Heart:  Regular rate and rhythm; no murmurs, clicks, rubs,  or gallops. Abdomen:  Normal bowel sounds.  No bruits.  Soft, non-tender and non-distended without masses, hepatosplenomegaly or hernias noted.  No guarding or rebound tenderness.   Rectal:  Deferred. Msk:  Symmetrical without gross deformities. Normal posture. Pulses:  Normal pulses noted. Extremities:  No clubbing or edema. Neurologic:  Alert and oriented x4;  grossly normal neurologically. Skin:  Intact without significant lesions or rashes. Lymph Nodes:  No significant cervical adenopathy. Psych:  Alert and cooperative. Normal mood and affect.

## 2012-01-21 MED ORDER — SODIUM CHLORIDE 0.45 % IV SOLN
Freq: Once | INTRAVENOUS | Status: AC
  Administered 2012-01-22: 11:00:00 via INTRAVENOUS

## 2012-01-22 ENCOUNTER — Encounter (HOSPITAL_COMMUNITY)
Admission: RE | Disposition: A | Discharge status: Home / Self Care | Payer: Self-pay | Source: Ambulatory Visit | Attending: Gastroenterology

## 2012-01-22 ENCOUNTER — Encounter (HOSPITAL_COMMUNITY): Payer: Self-pay | Admitting: *Deleted

## 2012-01-22 ENCOUNTER — Ambulatory Visit (HOSPITAL_COMMUNITY)
Admission: RE | Admit: 2012-01-22 | Discharge: 2012-01-22 | Disposition: A | Discharge status: Home / Self Care | Payer: Medicare Other | Source: Ambulatory Visit | Attending: Gastroenterology | Admitting: Gastroenterology

## 2012-01-22 DIAGNOSIS — K5900 Constipation, unspecified: Secondary | ICD-10-CM

## 2012-01-22 DIAGNOSIS — K5730 Diverticulosis of large intestine without perforation or abscess without bleeding: Secondary | ICD-10-CM

## 2012-01-22 DIAGNOSIS — Q438 Other specified congenital malformations of intestine: Secondary | ICD-10-CM | POA: Insufficient documentation

## 2012-01-22 DIAGNOSIS — Z1211 Encounter for screening for malignant neoplasm of colon: Secondary | ICD-10-CM

## 2012-01-22 DIAGNOSIS — D126 Benign neoplasm of colon, unspecified: Secondary | ICD-10-CM | POA: Insufficient documentation

## 2012-01-22 DIAGNOSIS — K648 Other hemorrhoids: Secondary | ICD-10-CM | POA: Insufficient documentation

## 2012-01-22 HISTORY — PX: COLONOSCOPY: SHX5424

## 2012-01-22 SURGERY — COLONOSCOPY
Anesthesia: Moderate Sedation

## 2012-01-22 MED ORDER — MIDAZOLAM HCL 5 MG/5ML IJ SOLN
INTRAMUSCULAR | Status: DC | PRN
  Administered 2012-01-22 (×2): 1 mg via INTRAVENOUS
  Administered 2012-01-22: 2 mg via INTRAVENOUS
  Administered 2012-01-22: 1 mg via INTRAVENOUS
  Administered 2012-01-22: 2 mg via INTRAVENOUS

## 2012-01-22 MED ORDER — MIDAZOLAM HCL 5 MG/5ML IJ SOLN
INTRAMUSCULAR | Status: AC
  Filled 2012-01-22: qty 10

## 2012-01-22 MED ORDER — STERILE WATER FOR IRRIGATION IR SOLN
Status: DC | PRN
  Administered 2012-01-22: 13:00:00

## 2012-01-22 MED ORDER — MEPERIDINE HCL 100 MG/ML IJ SOLN
INTRAMUSCULAR | Status: AC
  Filled 2012-01-22: qty 2

## 2012-01-22 MED ORDER — MEPERIDINE HCL 100 MG/ML IJ SOLN
INTRAMUSCULAR | Status: DC | PRN
  Administered 2012-01-22 (×3): 25 mg via INTRAVENOUS

## 2012-01-22 NOTE — Op Note (Signed)
[facility] [address] [place], [place]  [address]  COLONOSCOPY PROCEDURE REPORT  PATIENT:  Heather [name], Heather [name]  MR#:  [id number] BIRTHDATE:  18-Nov-1940, 70 yrs. old  GENDER:  female  ENDOSCOPIST:  Jonette Eva, MD REF. BY:  Glenice Laine, M.D. ASSISTANT:  PROCEDURE DATE:  01/22/2012 PROCEDURE:  Colonoscopy with biopsy and snare polypectomy, SPOT TATTOO  INDICATIONS:  Screening  MEDICATIONS:   Demerol 50 mg IV, Versed 5 mg IV  DESCRIPTION OF PROCEDURE:    Physical exam was performed. Informed consent was obtained from the patient after explaining the benefits, risks, and alternatives to procedure.  The patient was connected to monitor and placed in left lateral position. Continuous oxygen was provided by nasal cannula and IV medicine administered through an indwelling cannula.  After administration of sedation and rectal exam, the patient's rectum was intubated and the EC-[id number] ([id number]) colonoscope was advanced under direct visualization to the cecum.  The scope was removed slowly by carefully examining the color, texture, anatomy, and integrity mucosa on the way out.  The patient was recovered in endoscopy and discharged home in satisfactory condition. <<PROCEDUREIMAGES>>  FINDINGS:  There were THREE SESSILE polyps (8 MM-1.2 CM) identified and removed. in the cecum, ASCENDING COLON, & HEPATIC FLEXURE VIA COLD FORCEPS/SNARE AUTERY. 1 CC SPOT TATTOO PLACED AT BASE OF HF POLYP. Scattered diverticula were found throughout the colon.  SMALLInternal Hemorrhoids were found. TORTUOS COLON  PREP QUALITY: GOOD CECAL W/D TIME:     COMPLICATIONS:    None  ENDOSCOPIC IMPRESSION: 1) Polyps, multiple in the cOLON 2) MILD PAN-COLONIC DIVERTticulOSIS 3) Internal hemorrhoids  RECOMMENDATIONS: AWAIT BIOPSIES NO ASA/NSAIDS FOR 5 DAYS HIGH FIBER DIET TCS IN 3-5 YEARS WITH 1 HR TIME SLOT/OVERTUBE  REPEAT EXAM:  No  ______________________________ Jonette Eva, MD  CC:  Glenice Laine, M.D.  n. eSIGNED:   Sandi Fields at 01/22/2012 04:00 PM  Celene Squibb, [phone]

## 2012-01-22 NOTE — H&P (Signed)
  Primary Care Physician:  Avon Gully, MD Primary Gastroenterologist:  Dr. Darrick Penna  Pre-Procedure History & Physical: HPI:  Heather [name] is a 71 y.o. female here for COLON CANCER SCREENING.   Past Medical History  Diagnosis Date  . Tobacco abuse   . Nonsquamous nonsmall cell neoplasm of lung 09/2011  . Arthritis   . Psoriasis     Ankles  . Constipation     corrects with diet  . Floater, vitreous     Right  . Atrial fib/flutter, transient     post-op    Past Surgical History  Procedure Date  . Right vats (upper lobectomy) 08/2011    Dr Dorris Fetch    Prior to Admission medications   Medication Sig Start Date End Date Taking? Authorizing Provider  glucosamine-chondroitin 500-400 MG tablet Take 1 tablet by mouth daily.   Yes Historical Provider, MD  Multiple Vitamins-Iron (MULTIVITAMINS WITH IRON) TABS Take 1 tablet by mouth daily.   Yes Historical Provider, MD  calcium-vitamin D 250-100 MG-UNIT per tablet Take 1 tablet by mouth daily.     Historical Provider, MD  metoprolol tartrate (LOPRESSOR) 25 MG tablet Take 1 tablet (25 mg total) by mouth 2 (two) times daily. 10/01/11 09/30/12  Ardelle Balls, PA    Allergies as of 01/07/2012 - Review Complete 01/07/2012  Allergen Reaction Noted  . Ibuprofen Nausea And Vomiting 01/07/2012    Family History  Problem Relation Age of Onset  . Hypertension Mother   . Heart disease Mother   . Heart disease Sister   . Heart disease Brother     History   Social History  . Marital Status: Widowed    Spouse Name: N/A    Number of Children: 3  . Years of Education: N/A   Occupational History  . retired    Social History Main Topics  . Smoking status: Former Smoker -- 0.3 packs/day for 20 years    Types: Cigarettes    Quit date: 08/17/2011  . Smokeless tobacco: Never Used  . Alcohol Use: No  . Drug Use: No  . Sexually Active: Not on file   Other Topics Concern  . Not on file   Social History Narrative   Lives w/  cousin    Review of Systems: See HPI, otherwise negative ROS   Physical Exam: BP 145/83  Pulse 73  Temp 98.4 F (36.9 C) (Oral)  Resp 18  Ht 5\' 8"  (1.727 m)  Wt 165 lb (74.844 kg)  BMI 25.09 kg/m2  SpO2 97% General:   Alert,  pleasant and cooperative in NAD Head:  Normocephalic and atraumatic. Neck:  Supple;  Lungs:  Clear throughout to auscultation.    Heart:  Regular rate and rhythm. Abdomen:  Soft, nontender and nondistended. Normal bowel sounds, without guarding, and without rebound.   Neurologic:  Alert and  oriented x4;  grossly normal neurologically.  Impression/Plan:     SCREENING  Plan:  1. TCS TODAY

## 2012-01-29 ENCOUNTER — Encounter (HOSPITAL_COMMUNITY): Payer: Self-pay | Admitting: Gastroenterology

## 2012-01-30 ENCOUNTER — Telehealth: Payer: Self-pay | Admitting: Gastroenterology

## 2012-01-30 NOTE — Telephone Encounter (Signed)
Faxed to PCP, recall made for 5 years

## 2012-01-30 NOTE — Telephone Encounter (Signed)
LM for pt to call

## 2012-01-30 NOTE — Telephone Encounter (Signed)
Please call pt. She had a 3 simple adenomas removed from her colon. FOLLOW A High fiber diet. TCS in 5 years IF THE BENEFITS OUTWEIGH THE RISKS.

## 2012-01-30 NOTE — Telephone Encounter (Signed)
Routing to Dr. Fields.  

## 2012-01-30 NOTE — Telephone Encounter (Signed)
Heather [name] is asking for procedure results from 07/16 please advise

## 2012-01-31 NOTE — Telephone Encounter (Signed)
REVIEWED.  

## 2012-01-31 NOTE — Telephone Encounter (Signed)
Called and informed pt.  

## 2012-02-05 ENCOUNTER — Ambulatory Visit: Payer: Medicare Other | Admitting: Thoracic Surgery (Cardiothoracic Vascular Surgery)

## 2012-02-05 ENCOUNTER — Other Ambulatory Visit: Payer: Medicare Other

## 2012-02-12 ENCOUNTER — Encounter: Payer: Self-pay | Admitting: Thoracic Surgery (Cardiothoracic Vascular Surgery)

## 2012-02-12 ENCOUNTER — Ambulatory Visit
Admission: RE | Admit: 2012-02-12 | Discharge: 2012-02-12 | Disposition: A | Discharge status: Home / Self Care | Payer: Medicare Other | Source: Ambulatory Visit | Attending: Thoracic Surgery (Cardiothoracic Vascular Surgery) | Admitting: Thoracic Surgery (Cardiothoracic Vascular Surgery)

## 2012-02-12 ENCOUNTER — Ambulatory Visit (INDEPENDENT_AMBULATORY_CARE_PROVIDER_SITE_OTHER): Payer: Medicare Other | Admitting: Thoracic Surgery (Cardiothoracic Vascular Surgery)

## 2012-02-12 VITALS — BP 167/92 | HR 58 | Resp 18 | Ht 70.5 in | Wt 169.0 lb

## 2012-02-12 DIAGNOSIS — C3490 Malignant neoplasm of unspecified part of unspecified bronchus or lung: Secondary | ICD-10-CM

## 2012-02-12 DIAGNOSIS — Z9889 Other specified postprocedural states: Secondary | ICD-10-CM

## 2012-02-12 NOTE — Progress Notes (Signed)
  HPI:  Mrs. Schriever returns for a scheduled followup appointment. She had a right upper lobectomy for a stage IB adenocarcinoma on 09/24/2011. She had some postoperative atrial fibrillation which was controlled with beta blockers. She says overall she's been feeling well although she is anxious about her scan today. She does say that she's been having some pain in the incision into the right breast. She says it that's been worse for about the past week. Is not related to exertion. She is requesting pain medication. She's not tried taking Tylenol, aspirin or NSAIDs for the pain. She's not having problems with her breathing. She denies headaches or visual changes  Past Medical History  Diagnosis Date  . Tobacco abuse   . Nonsquamous nonsmall cell neoplasm of lung 09/2011  . Arthritis   . Psoriasis     Ankles  . Constipation     corrects with diet  . Floater, vitreous     Right  . Atrial fib/flutter, transient     post-op      Current Outpatient Prescriptions  Medication Sig Dispense Refill  . calcium-vitamin D 250-100 MG-UNIT per tablet Take 1 tablet by mouth daily.       Marland Kitchen glucosamine-chondroitin 500-400 MG tablet Take 1 tablet by mouth daily.      . metoprolol tartrate (LOPRESSOR) 25 MG tablet Take 1 tablet (25 mg total) by mouth 2 (two) times daily.  60 tablet  1  . Multiple Vitamins-Iron (MULTIVITAMINS WITH IRON) TABS Take 1 tablet by mouth daily.        Physical Exam BP 167/92  Pulse 58  Resp 18  Ht 5' 10.5" (1.791 m)  Wt 169 lb (76.658 kg)  BMI 23.91 kg/m2  SpO2 45% Gen. 71 year old woman in no acute distress No cervical or supraclavicular or epitrochlear adenopathy Lungs diminished bilaterally Cardiac regular rate and rhythm normal S1 and S2 Incisions well healed  Diagnostic Tests: CT of chest 02/12/2012 IMPRESSION:  1. Interval right upper lobe resection without demonstrated  complication.  2. No evidence of metastatic disease.  3. Numerous ground-glass opacities  throughout both lungs  concerning for additional foci of low grade adenocarcinoma. These  have not grossly changed from the prior study allowing for better  inspiration currently. Continued follow-up is recommended.   Impression: Mrs. Rosenbloom is doing well at this point, no evidence recurrent disease 4 1/2 months postop. I gave her a prescription for Vicodin 5/325 1-2-3 times daily as needed for pain, 30 tablets no refills. I encouraged her to try Tylenol or aspirin or nonsteroidal before using the Vicodin, but she can have been around just in case. I informed her of the results of the chest CT.  Plan: Return in 4 months with a CT of chest

## 2012-03-12 DIAGNOSIS — R110 Nausea: Secondary | ICD-10-CM

## 2012-03-14 ENCOUNTER — Encounter: Payer: Self-pay | Admitting: Cardiology

## 2012-03-14 DIAGNOSIS — I4891 Unspecified atrial fibrillation: Secondary | ICD-10-CM

## 2012-05-21 ENCOUNTER — Other Ambulatory Visit: Payer: Self-pay | Admitting: Thoracic Surgery (Cardiothoracic Vascular Surgery)

## 2012-05-21 DIAGNOSIS — D381 Neoplasm of uncertain behavior of trachea, bronchus and lung: Secondary | ICD-10-CM

## 2012-05-27 ENCOUNTER — Other Ambulatory Visit: Payer: Self-pay | Admitting: Thoracic Surgery (Cardiothoracic Vascular Surgery)

## 2012-05-27 DIAGNOSIS — D381 Neoplasm of uncertain behavior of trachea, bronchus and lung: Secondary | ICD-10-CM

## 2012-06-17 ENCOUNTER — Ambulatory Visit
Admission: RE | Admit: 2012-06-17 | Discharge: 2012-06-17 | Disposition: A | Discharge status: Home / Self Care | Payer: PRIVATE HEALTH INSURANCE | Source: Ambulatory Visit | Attending: Thoracic Surgery (Cardiothoracic Vascular Surgery) | Admitting: Thoracic Surgery (Cardiothoracic Vascular Surgery)

## 2012-06-17 ENCOUNTER — Encounter: Payer: Self-pay | Admitting: Thoracic Surgery (Cardiothoracic Vascular Surgery)

## 2012-06-17 ENCOUNTER — Ambulatory Visit (INDEPENDENT_AMBULATORY_CARE_PROVIDER_SITE_OTHER): Payer: PRIVATE HEALTH INSURANCE | Admitting: Thoracic Surgery (Cardiothoracic Vascular Surgery)

## 2012-06-17 VITALS — BP 131/85 | HR 67 | Resp 18 | Ht 70.0 in | Wt 158.0 lb

## 2012-06-17 DIAGNOSIS — Z85118 Personal history of other malignant neoplasm of bronchus and lung: Secondary | ICD-10-CM

## 2012-06-17 DIAGNOSIS — Z09 Encounter for follow-up examination after completed treatment for conditions other than malignant neoplasm: Secondary | ICD-10-CM

## 2012-06-17 DIAGNOSIS — D381 Neoplasm of uncertain behavior of trachea, bronchus and lung: Secondary | ICD-10-CM

## 2012-06-17 NOTE — Progress Notes (Signed)
HPI:  Heather [name] returns today for a scheduled followup visit. She had a right upper lobectomy for a stage IB adenocarcinoma back in March of this year. She was last in the office 4 months ago at which time she was doing well with no evidence recurrent disease. Says she feels well overall. She does have some aching discomfort in her right breast area and chest wall. She's not had any problems with her breathing. She's not had a cough or hemoptysis. She denies significant weight loss.  Past Medical History  Diagnosis Date  . Tobacco abuse   . Nonsquamous nonsmall cell neoplasm of lung 09/2011  . Arthritis   . Psoriasis     Ankles  . Constipation     corrects with diet  . Floater, vitreous     Right  . Atrial fib/flutter, transient     post-op      Current Outpatient Prescriptions  Medication Sig Dispense Refill  . aspirin 81 MG tablet Take 81 mg by mouth daily. Take 2 tabs daily      . calcium-vitamin D 250-100 MG-UNIT per tablet Take 1 tablet by mouth daily.       Marland Kitchen glucosamine-chondroitin 500-400 MG tablet Take 1 tablet by mouth daily.      . metoprolol tartrate (LOPRESSOR) 25 MG tablet Take 1 tablet (25 mg total) by mouth 2 (two) times daily.  60 tablet  1  . Multiple Vitamins-Iron (MULTIVITAMINS WITH IRON) TABS Take 1 tablet by mouth daily.        Physical Exam BP 131/85  Pulse 67  Resp 18  Ht 5\' 10"  (1.778 m)  Wt 158 lb (71.668 kg)  BMI 22.67 kg/m2  SpO2 57% Gen. 71 year old woman in no acute distress Incisions well healed Lungs clear with essentially equal breath sounds bilaterally Cardiac regular rate and rhythm normal S1-S2 no rubs or gallops No palpable abnormality or tenderness in the right upper anterior chest or breast region  Diagnostic Tests: CT chest 06/17/2012 CT CHEST WITHOUT CONTRAST  Technique: Multidetector CT imaging of the chest was performed  following the standard protocol without IV contrast.  Comparison: 02/12/2012  Findings: There is no  axillary or supraclavicular adenopathy.  No mediastinal or hilar adenopathy identified. Calcifications  involving the LAD coronary artery noted. There is mild cardiac  enlargement. No pericardial effusion.  Postoperative change is noted within the right upper lung zone.  Within the superior segment of the right lower lobe there is a  faint, well circumscribed ground-glass nodule measuring 8 mm. This  is unchanged from previous exam. Ground-glass nodule in the left  upper lobe is unchanged measuring 6 mm, image 10. At a slightly  lower level there is a sub solid nodule measuring 1.2 cm, image 19.  Stable from previous exam. Also in the left upper lobe is a 9 mm  sub solid nodule, image 20. Other scattered sub solid nodules are  seen elsewhere within the lungs bilaterally.  Review of the visualized osseous structures is significant for mild  thoracic spondylosis.  Imaging through the upper abdomen shows a cyst within the lateral  segment of the left hepatic lobe. This is unchanged from previous  exam.  IMPRESSION:  1. No significant change from 02/12/2012. Stable right upper lobe  resection without complication.  2. Again noted are numerous ground-glass densities throughout both  lungs concerning for additional foci of low grade adenocarcinoma.  Index lesions are stable compared with previous exam.   Impression: 71 year old woman now 8  months out from right upper lobectomy for a stage IB non-small cell carcinoma. She's doing very well at this point in time. She has no evidence recurrent disease. She does have multiple small groundglass opacities, all of which are stable. These will be followed on scans as she needs followup CTs any weight.   Plan:  Return 4 months for one year followup visit with CT of the chest.

## 2012-09-26 ENCOUNTER — Other Ambulatory Visit: Payer: Self-pay

## 2012-09-26 DIAGNOSIS — C3490 Malignant neoplasm of unspecified part of unspecified bronchus or lung: Secondary | ICD-10-CM

## 2012-10-14 ENCOUNTER — Ambulatory Visit: Payer: PRIVATE HEALTH INSURANCE | Admitting: Thoracic Surgery (Cardiothoracic Vascular Surgery)

## 2012-10-14 ENCOUNTER — Other Ambulatory Visit: Payer: PRIVATE HEALTH INSURANCE

## 2012-10-28 ENCOUNTER — Ambulatory Visit
Admission: RE | Admit: 2012-10-28 | Discharge: 2012-10-28 | Disposition: A | Discharge status: Home / Self Care | Payer: PRIVATE HEALTH INSURANCE | Source: Ambulatory Visit | Attending: Thoracic Surgery (Cardiothoracic Vascular Surgery) | Admitting: Thoracic Surgery (Cardiothoracic Vascular Surgery)

## 2012-10-28 ENCOUNTER — Encounter: Payer: Self-pay | Admitting: Thoracic Surgery (Cardiothoracic Vascular Surgery)

## 2012-10-28 ENCOUNTER — Ambulatory Visit (INDEPENDENT_AMBULATORY_CARE_PROVIDER_SITE_OTHER): Payer: PRIVATE HEALTH INSURANCE | Admitting: Thoracic Surgery (Cardiothoracic Vascular Surgery)

## 2012-10-28 ENCOUNTER — Ambulatory Visit: Payer: PRIVATE HEALTH INSURANCE | Admitting: Thoracic Surgery (Cardiothoracic Vascular Surgery)

## 2012-10-28 VITALS — BP 174/94 | HR 67 | Resp 20 | Ht 70.0 in | Wt 193.0 lb

## 2012-10-28 DIAGNOSIS — Z902 Acquired absence of lung [part of]: Secondary | ICD-10-CM

## 2012-10-28 DIAGNOSIS — Z85118 Personal history of other malignant neoplasm of bronchus and lung: Secondary | ICD-10-CM

## 2012-10-28 DIAGNOSIS — C3490 Malignant neoplasm of unspecified part of unspecified bronchus or lung: Secondary | ICD-10-CM

## 2012-10-28 DIAGNOSIS — Z9889 Other specified postprocedural states: Secondary | ICD-10-CM

## 2012-10-28 NOTE — Progress Notes (Signed)
HPI:  Heather [name] returns today for a scheduled followup visit. She is approximately one year out from a thoracoscopic right upper lobectomy for a stage Ib non-small cell carcinoma. She was last in the office in December which time she was doing well. Her CT at that time showed some groundglass opacities in the left lung but no evidence of recurrent cancer.  She says that in the interim since her last visit she's been feeling well. She says she was very anxious about her appointment today and thinks that may be why her blood pressure is up, because her blood pressure usually is much better than that. She's not had any chest pain or shortness of breath. She denies weight loss, headaches, visual changes, unusual bone or joint pain. She has an occasional cough. She denies hemoptysis.  Past Medical History  Diagnosis Date  . Tobacco abuse   . Nonsquamous nonsmall cell neoplasm of lung 09/2011  . Arthritis   . Psoriasis     Ankles  . Constipation     corrects with diet  . Floater, vitreous     Right  . Atrial fib/flutter, transient     post-op      Current Outpatient Prescriptions  Medication Sig Dispense Refill  . aspirin 81 MG tablet Take 81 mg by mouth daily.       . calcium-vitamin D 250-100 MG-UNIT per tablet Take 1 tablet by mouth daily.       Marland Kitchen glucosamine-chondroitin 500-400 MG tablet Take 1 tablet by mouth daily.      . metoprolol tartrate (LOPRESSOR) 25 MG tablet Take 1 tablet (25 mg total) by mouth 2 (two) times daily.  60 tablet  1  . metoprolol tartrate (LOPRESSOR) 25 MG tablet Take 25 mg by mouth 2 (two) times daily.       . Multiple Vitamins-Iron (MULTIVITAMINS WITH IRON) TABS Take 1 tablet by mouth daily.       No current facility-administered medications for this visit.    Physical Exam BP 174/94  Pulse 67  Resp 20  Ht 5\' 10"  (1.778 m)  Wt 193 lb (87.544 kg)  BMI 27.69 kg/m2  SpO42 79% 72 year old woman in no acute distress Neurologic alert and oriented x3 with  no focal deficits Lungs clear with equal breath sounds bilaterally No cervical, supraclavicular, axillary or epitrochlear adenopathy Regular rate and rhythm  Diagnostic Tests: CT of chest 10/28/2012 *RADIOLOGY REPORT*  Clinical Data: Follow-up lung cancer. Prior right upper lobe  resection.  CT CHEST WITHOUT CONTRAST  Technique: Multidetector CT imaging of the chest was performed  following the standard protocol without IV contrast.  Comparison: CT 06/17/2012, PET CT 08/21/2011  Findings: Post surgical change in the right upper lobe with mild  pleural thickening and scarring unchanged from prior. There are  again demonstrated numerous ground-glass nodules which are not  changed in size or number compared to prior.  For example 7 mm right upper lobe nodule (image #9) unchanged from  8 mm on prior. 8 mm nodule in the left upper lobe is unchanged  from 9 mm on prior (image 19. 10 mm left upper lobe nodule not  changed from 12 mm on prior. Central airways are normal.  Limited view of the upper abdomen demonstrates normal adrenal  glands. There is low density lesion within the left hepatic lobe  which has simple fluid attenuation and was not hypermetabolic on  comparison PET CT scan. There are multiple gallstones in the  gallbladder.  Limited view  of the skeleton demonstrates no aggressive osseous  lesions.  There is no axillary or supraclavicular adenopathy. No mediastinal  adenopathy on this noncontrast exam. Coronary calcifications are  noted.  IMPRESSION:  1. No evidence of lung cancer recurrence in the right upper lobe.  2. Stable multiple ground-glass nodules. Recommend attention on  follow-up.  3. Cholelithiasis  Impression: 72 year old woman now one year out from a thoracoscopic right upper lobectomy for a stage IB non-small cell carcinoma. She has no evidence recurrent disease. Overall she's doing very well. I am concerned about her blood pressure but she says that she  checks are fairly regular basis and has not been elevated to that extent. She says she was very anxious about the scan and thinks her blood pressure may be related to that. I advised her to keep an eye on that, if it remains elevated may need to adjust medications.  We will continue to follow her every 4 months out to 2 years and then every 6 months after that.  Plan: Return in 4 months with CT of chest

## 2013-02-05 ENCOUNTER — Other Ambulatory Visit: Payer: Self-pay

## 2013-02-05 DIAGNOSIS — D381 Neoplasm of uncertain behavior of trachea, bronchus and lung: Secondary | ICD-10-CM

## 2013-02-17 DIAGNOSIS — R42 Dizziness and giddiness: Secondary | ICD-10-CM

## 2013-03-03 ENCOUNTER — Ambulatory Visit: Payer: PRIVATE HEALTH INSURANCE | Admitting: Thoracic Surgery (Cardiothoracic Vascular Surgery)

## 2013-03-03 ENCOUNTER — Other Ambulatory Visit: Payer: PRIVATE HEALTH INSURANCE

## 2013-03-10 ENCOUNTER — Ambulatory Visit: Payer: PRIVATE HEALTH INSURANCE | Admitting: Thoracic Surgery (Cardiothoracic Vascular Surgery)

## 2013-03-10 ENCOUNTER — Other Ambulatory Visit: Payer: PRIVATE HEALTH INSURANCE

## 2013-07-14 ENCOUNTER — Encounter: Payer: Self-pay | Admitting: Thoracic Surgery (Cardiothoracic Vascular Surgery)

## 2013-07-14 ENCOUNTER — Ambulatory Visit
Admission: RE | Admit: 2013-07-14 | Discharge: 2013-07-14 | Disposition: A | Discharge status: Home / Self Care | Payer: PRIVATE HEALTH INSURANCE | Source: Ambulatory Visit | Attending: Thoracic Surgery (Cardiothoracic Vascular Surgery) | Admitting: Thoracic Surgery (Cardiothoracic Vascular Surgery)

## 2013-07-14 ENCOUNTER — Ambulatory Visit (INDEPENDENT_AMBULATORY_CARE_PROVIDER_SITE_OTHER): Payer: PRIVATE HEALTH INSURANCE | Admitting: Thoracic Surgery (Cardiothoracic Vascular Surgery)

## 2013-07-14 DIAGNOSIS — Z9889 Other specified postprocedural states: Secondary | ICD-10-CM

## 2013-07-14 DIAGNOSIS — Z902 Acquired absence of lung [part of]: Secondary | ICD-10-CM

## 2013-07-14 DIAGNOSIS — C3410 Malignant neoplasm of upper lobe, unspecified bronchus or lung: Secondary | ICD-10-CM

## 2013-07-14 DIAGNOSIS — D381 Neoplasm of uncertain behavior of trachea, bronchus and lung: Secondary | ICD-10-CM

## 2013-07-14 NOTE — Progress Notes (Signed)
HPI:  Heather [name] returns today for a scheduled postoperative followup visit. She had a thoracoscopic right upper lobectomy for a stage IB non-small cell carcinoma in March of 2013. She was last in the office in April of last year. At that time she was doing well with no evidence recurrent disease. Her CT showed multiple groundglass opacities bilaterally. And we had scheduled her to followup in August. However she missed her August appointment. She said it was due to issues with insurance coverage.  In the interim since her last visit she says she's gained some weight. Her breathing is been stable. She has not needed any nebulizers or breathing treatments. She has an occasional cough. She has not had any hemoptysis. She's not having any unusual headaches or visual changes. She does not had any new bone or joint pain. Overall she feels well.  Past Medical History  Diagnosis Date  . Tobacco abuse   . Nonsquamous nonsmall cell neoplasm of lung 09/2011  . Arthritis   . Psoriasis     Ankles  . Constipation     corrects with diet  . Floater, vitreous     Right  . Atrial fib/flutter, transient     post-op      Current Outpatient Prescriptions  Medication Sig Dispense Refill  . aspirin 81 MG tablet Take 81 mg by mouth daily.       . calcium-vitamin D 250-100 MG-UNIT per tablet Take 1 tablet by mouth daily.       Marland Kitchen glucosamine-chondroitin 500-400 MG tablet Take 1 tablet by mouth daily.      . meclizine (ANTIVERT) 12.5 MG tablet Take 12.5 mg by mouth 2 (two) times daily as needed for dizziness.      . metoprolol tartrate (LOPRESSOR) 25 MG tablet Take 25 mg by mouth 2 (two) times daily.       . Multiple Vitamins-Iron (MULTIVITAMINS WITH IRON) TABS Take 1 tablet by mouth daily.       No current facility-administered medications for this visit.    Physical Exam BP 144/90  Pulse 78  Resp 16  Ht 5\' 10"  (1.778 m)  Wt 175 lb (79.379 kg)  BMI 25.11 kg/m2  SpO2 69% 73 year old woman in no  acute distress Well-developed and well-nourished Neurologic alert and oriented x3 with no focal deficits No cervical or subclavicular adenopathy Lungs clear with equal breath sounds bilaterally Incisions well healed Cardiac regular rate and rhythm normal S1 and S2  Diagnostic Tests:  CT chest 07/14/2013 CT CHEST WITHOUT CONTRAST  TECHNIQUE:  Multidetector CT imaging of the chest was performed following the  standard protocol without IV contrast.  COMPARISON: CT CHEST W/O CM dated 10/28/2012  FINDINGS:  Postsurgical change again noted in the right upper lobe with pleural  thickening and scarring not changed from prior. There is a 6 mm  ground-glass nodule the right upper lobe (image 15, series 4) which  is unchanged from prior. There is a small cavitary lesion in the  right lower lobe measuring 11 mm (image 36 series 4) which is  unchanged from 13 mm on prior.  Within the left upper lobe there again demonstrated multiple  ground-glass nodules. The largest measures 14 mm (image 21) compared  to 12 mm on prior remeasured. Sub mm nodule at the left lung apex  (image 13) is unchanged 7 on prior. 8 mm nodule peripherally (image  23) is unchanged. Thin-walled cavitary lesion in the left upper lobe  measuring 19 mm (image 28) is  unchanged.  No axillary supraclavicular lymphadenopathy. No mediastinal hilar  lymphadenopathy.  Limited view of the upper abdomen demonstrates normal adrenal  glands. Low-density cyst in liver again demonstrated. Gallstones  noted. No aggressive osseous lesion.  IMPRESSION:  1. Stable post surgical change in the right upper lobe without  evidence of local recurrence.  2. Multiple bilateral ground-glass nodules are not significantly  changed in size. Recommend continued follow-up as low-grade  adenocarcinoma is in the differential for these nodules.  3. Stable small thin-walled cavitary lesions in the left and right  lungs.  Electronically Signed  By: Suzy Bouchard M.D.  On: 07/14/2013 10:33   Impression: 73 year old woman who now is almost 2 years out from a right upper lobectomy for a stage IB non-small cell lung cancer. She has no evidence recurrent disease.  She does have multiple groundglass opacities and small cavitary lesions bilaterally on her CT scan. These are unchanged from April. They do need continued followup. We will plan to see her back in 6 months with a repeat CT of the chest.

## 2013-12-18 ENCOUNTER — Other Ambulatory Visit: Payer: Self-pay | Admitting: *Deleted

## 2013-12-18 DIAGNOSIS — R911 Solitary pulmonary nodule: Secondary | ICD-10-CM

## 2014-02-02 ENCOUNTER — Encounter: Payer: Self-pay | Admitting: Thoracic Surgery (Cardiothoracic Vascular Surgery)

## 2014-02-02 ENCOUNTER — Ambulatory Visit (INDEPENDENT_AMBULATORY_CARE_PROVIDER_SITE_OTHER): Payer: PRIVATE HEALTH INSURANCE | Admitting: Thoracic Surgery (Cardiothoracic Vascular Surgery)

## 2014-02-02 ENCOUNTER — Encounter (INDEPENDENT_AMBULATORY_CARE_PROVIDER_SITE_OTHER): Payer: Self-pay

## 2014-02-02 ENCOUNTER — Ambulatory Visit
Admission: RE | Admit: 2014-02-02 | Discharge: 2014-02-02 | Disposition: A | Discharge status: Home / Self Care | Payer: PRIVATE HEALTH INSURANCE | Source: Ambulatory Visit | Attending: Thoracic Surgery (Cardiothoracic Vascular Surgery) | Admitting: Thoracic Surgery (Cardiothoracic Vascular Surgery)

## 2014-02-02 VITALS — BP 148/84 | HR 48 | Resp 20 | Ht 70.0 in | Wt 175.0 lb

## 2014-02-02 DIAGNOSIS — Z85118 Personal history of other malignant neoplasm of bronchus and lung: Secondary | ICD-10-CM

## 2014-02-02 DIAGNOSIS — R911 Solitary pulmonary nodule: Secondary | ICD-10-CM

## 2014-02-02 DIAGNOSIS — R918 Other nonspecific abnormal finding of lung field: Secondary | ICD-10-CM

## 2014-02-02 NOTE — Progress Notes (Signed)
HPI:  Heather [name] is a 73 year old woman with a history of like tobacco use. She had a right upper lobectomy back in March of 2013. She turned out to have a stage IB non-small cell carcinoma. She has been followed since that time. She was last in the office in January of this year at which time her CT showed multiple small groundglass nodules in the lungs bilaterally. None of these were suspicious enough to warrant further investigation but did require continued followup.  She says that since her last visit she's been doing well. She denies coughing, hemoptysis, shortness of breath, wheezing, chest pain, adenopathy, unusual headaches or visual changes. She says her weight has been stable.  She tells me that she quit smoking completely 3 weeks ago. Interestingly she had previously to me she had quit smoking altogether in February of 2013.   Current Outpatient Prescriptions  Medication Sig Dispense Refill  . aspirin 81 MG tablet Take 81 mg by mouth daily.       . calcium-vitamin D 250-100 MG-UNIT per tablet Take 1 tablet by mouth daily.       Marland Kitchen glucosamine-chondroitin 500-400 MG tablet Take 1 tablet by mouth daily.      . meclizine (ANTIVERT) 12.5 MG tablet Take 12.5 mg by mouth 2 (two) times daily as needed for dizziness.      . metoprolol tartrate (LOPRESSOR) 25 MG tablet Take 25 mg by mouth 2 (two) times daily.       . Multiple Vitamins-Iron (MULTIVITAMINS WITH IRON) TABS Take 1 tablet by mouth daily.       No current facility-administered medications for this visit.    Physical Exam BP 148/84  Pulse 48  Resp 20  Ht 5\' 10"  (1.778 m)  Wt 175 lb (79.379 kg)  BMI 25.11 kg/m2  SpO25 55% 72 year old Woman in no acute distress HEENT no cervical or supraclavicular adenopathy Alert and oriented x3 with no focal neurologic deficit Lungs diminished breath sounds at right base otherwise clear, no wheezing Cardiac regular rate and rhythm normal S1 and S2 No peripheral edema  Diagnostic  Tests: CT CHEST WITHOUT CONTRAST  TECHNIQUE:  Multidetector CT imaging of the chest was performed following the  standard protocol without IV contrast.  COMPARISON: 07/14/2013  FINDINGS:  Lungs/Pleura: Status post right upper lobectomy.  Mild centrilobular emphysema.  Right lung base interstitial opacity is not significantly changed at  1.0 cm on image 34.  Scattered Ground-glass opacities are again identified bilaterally. A  right lower lobe 8 mm nodule on image 18/series 4 is unchanged.  Immediately lateral right lower lobe 6 mm nodule on image 15 is  unchanged.  Left upper lobe ground-glass nodule measures 1.0 x 0.7 cm on image  11. Similar versus minimally enlarged from 9 x 7 mm on the prior  exam.  Central left upper lobe ground-glass nodule of 1.2 x 0.9 cm on image  19 appears similar to 1.4 x 0.9 cm on the prior exam  A more lateral left upper lobe ground-glass nodule measures 1.0 cm  on image 20 and is unchanged.  Lingular focus of cavitation measures 1.6 cm on image 26 versus 1.9  cm on the prior. This is felt to be similar.  No pleural fluid.  Heart/Mediastinum: No supraclavicular adenopathy. Aortic and branch  vessel atherosclerosis. Mild cardiomegaly with LAD coronary artery  atherosclerosis. No pericardial effusion. Pulmonary artery  enlargement, with the right pulmonary artery measuring 3.1 cm. No  mediastinal or definite hilar adenopathy, given limitations  of  unenhanced CT.  Upper Abdomen: Cholelithiasis. Left hepatic lobe cyst. Normal imaged  portions of the spleen, stomach, pancreas, right kidney, adrenal  glands. Mildly scarred left kidney with subcentimeter low-density  upper pole lesion, likely a cyst. Sore constipation  Bones/Musculoskeletal: Accentuation of expected thoracic kyphosis.  IMPRESSION:  1. Status post right upper lobectomy, without locally recurrent or  metastatic disease.  2. Innumerable ground-glass nodules throughout both lungs. These are   primarily felt to be similar. Left upper lobe 1.0 cm nodule may have  enlarged minimally. Recommend ongoing surveillance.  3. Pulmonary artery enlargement suggests pulmonary arterial  hypertension.  4. Atherosclerosis, including within the coronary arteries.  5. Cholelithiasis  6. Possible constipation.  Electronically Signed  By: Abigail Miyamoto M.D.  On: 02/02/2014 11:13  Impression: 73 year old woman who is now almost 2 and half years post right upper lobectomy for a stage IB lesion. She has no evidence of recurrent disease. However, she does have multiple groundglass opacities in both lungs any of which could be early cancers or adenocarcinoma in situ. On her CT at this visit one of the lesions in the left upper lobe appeared that could have been minimally enlarged. There is another area that appeared to be slightly smaller so this altered be due to slight with different planes on the CT slices. In either event my recommendation would be that we repeat her scan in 6 months for her 3 year followup, as these lesions are suspicious enough currently toward biopsy.  Plan:  Return in 6 months with CT of chest

## 2014-06-18 ENCOUNTER — Other Ambulatory Visit: Payer: Self-pay | Admitting: *Deleted

## 2014-06-18 DIAGNOSIS — R911 Solitary pulmonary nodule: Secondary | ICD-10-CM

## 2014-07-09 HISTORY — PX: CATARACT EXTRACTION: SUR2

## 2014-07-20 ENCOUNTER — Encounter: Payer: Self-pay | Admitting: Thoracic Surgery (Cardiothoracic Vascular Surgery)

## 2014-07-20 ENCOUNTER — Ambulatory Visit (INDEPENDENT_AMBULATORY_CARE_PROVIDER_SITE_OTHER): Payer: Commercial Managed Care - HMO | Admitting: Thoracic Surgery (Cardiothoracic Vascular Surgery)

## 2014-07-20 ENCOUNTER — Ambulatory Visit
Admission: RE | Admit: 2014-07-20 | Discharge: 2014-07-20 | Disposition: A | Discharge status: Home / Self Care | Payer: Commercial Managed Care - HMO | Source: Ambulatory Visit | Attending: Thoracic Surgery (Cardiothoracic Vascular Surgery) | Admitting: Thoracic Surgery (Cardiothoracic Vascular Surgery)

## 2014-07-20 VITALS — BP 150/95 | HR 68 | Resp 20 | Ht 70.0 in | Wt 172.0 lb

## 2014-07-20 DIAGNOSIS — Z902 Acquired absence of lung [part of]: Secondary | ICD-10-CM

## 2014-07-20 DIAGNOSIS — C3491 Malignant neoplasm of unspecified part of right bronchus or lung: Secondary | ICD-10-CM | POA: Insufficient documentation

## 2014-07-20 DIAGNOSIS — R911 Solitary pulmonary nodule: Secondary | ICD-10-CM

## 2014-07-20 DIAGNOSIS — R918 Other nonspecific abnormal finding of lung field: Secondary | ICD-10-CM

## 2014-07-20 DIAGNOSIS — I712 Thoracic aortic aneurysm, without rupture: Secondary | ICD-10-CM

## 2014-07-20 DIAGNOSIS — I7121 Aneurysm of the ascending aorta, without rupture: Secondary | ICD-10-CM | POA: Insufficient documentation

## 2014-07-20 DIAGNOSIS — Z85118 Personal history of other malignant neoplasm of bronchus and lung: Secondary | ICD-10-CM

## 2014-07-20 DIAGNOSIS — Z9889 Other specified postprocedural states: Secondary | ICD-10-CM

## 2014-07-20 HISTORY — DX: Thoracic aortic aneurysm, without rupture: I71.2

## 2014-07-20 HISTORY — DX: Aneurysm of the ascending aorta, without rupture: I71.21

## 2014-07-20 NOTE — Progress Notes (Signed)
HPI: Heather [name] returns for a scheduled 2 year follow-up visit.  She is a 74 year old woman who had a right upper lobectomy in March 2013 for a stage IB non-small cell carcinoma. She was last seen in the office in July 2015. At that time she was doing well. She had noticed recurrent cancer, although she does have multiple groundglass opacities bilaterally.  Since her last visit she was doing well until about 4 days ago when she broke a toe on her right foot. This has been causing her a lot of pain. She is taking hydrocodone for that. She has not had any issues with her breathing. She denies wheezing. She does have an occasional cough, but nothing out of the ordinary. She denies hemoptysis. She is not having any chest pain or shortness of breath. Her weight has been stable although it fluctuates a bit. She's not had any unusual headaches or visual changes.  Past Medical History  Diagnosis Date  . Tobacco abuse   . Arthritis   . Psoriasis     Ankles  . Constipation     corrects with diet  . Floater, vitreous     Right  . Atrial fib/flutter, transient     post-op  . Ascending aortic aneurysm 07/20/2014    4.1 cm by CT  . Nonsquamous nonsmall cell neoplasm of lung 09/2011    Stage IB- s/p right upper lobectomy      Current Outpatient Prescriptions  Medication Sig Dispense Refill  . alendronate (FOSAMAX) 70 MG tablet Take 70 mg by mouth once a week.   3  . aspirin 81 MG tablet Take 81 mg by mouth daily.     . calcium-vitamin D 250-100 MG-UNIT per tablet Take 1 tablet by mouth daily.     Marland Kitchen glucosamine-chondroitin 500-400 MG tablet Take 1 tablet by mouth daily.    Marland Kitchen HYDROcodone-acetaminophen (NORCO/VICODIN) 5-325 MG per tablet Take 1 tablet by mouth every 6 (six) hours as needed. for pain  0  . meclizine (ANTIVERT) 12.5 MG tablet Take 12.5 mg by mouth 2 (two) times daily as needed for dizziness.    . metoprolol tartrate (LOPRESSOR) 25 MG tablet Take 25 mg by mouth 2 (two) times daily.      . Multiple Vitamins-Iron (MULTIVITAMINS WITH IRON) TABS Take 1 tablet by mouth daily.     No current facility-administered medications for this visit.    Physical Exam BP 150/95 mmHg  Pulse 68  Resp 20  Ht 5\' 10"  (1.778 m)  Wt 172 lb (78.019 kg)  BMI 24.68 kg/m2  SpO82 66% 74 year old woman in no acute distress Well-developed and well-nourished Alert and oriented 3 with no focal neurologic deficits No cervical or suprapubic or adenopathy Lungs clear bilaterally Incisions well healed Cardiac regular rate and rhythm normal S1 and S2  CT CHEST WITHOUT CONTRAST  TECHNIQUE: Multidetector CT imaging of the chest was performed following the standard protocol without IV contrast.  COMPARISON: 02/02/2014  FINDINGS: Mediastinum/Nodes: Ascending thoracic aorta 4.1 cm in diameter. Coronary artery atherosclerosis. Mild cardiomegaly. Tortuous branch vessels. No thoracic adenopathy observed. Prominent central pulmonary arterial structures, stable.  Lungs/Pleura: Scattered ground-glass density sub solid nodules in both lungs. Index left upper lobe nodule 1.3 by 0.7 cm, previously by my measurements the same on 02/02/2014 but new compared to 08/21/11, and only measuring 5 mm in diameter on 02/12/2012. Many of the other scattered ground-glass opacities are similar, such as the 1.1 by 0.8 cm right lower lobe ground-glass opacity on image  16 of series 4 which measured 1.1 by 0.7 cm on 02/02/2014. Many of the other scattered ground-glass opacities in both lungs are essentially stable from 02/12/2012. Small filling defects in the upper trachea are likely from mucus plugging.  Upper abdomen: Cholelithiasis.  Musculoskeletal: Thoracic spondylosis and kyphosis.  IMPRESSION: 1. Scattered ground-glass sub solid nodules in both lungs. These are all stable from 02/02/2014, but the index left upper lobe nodule on image 11 of series 4 has increased in size compared to 02/12/2012. Low  grade adenocarcinoma is certainly not excluded. 2. Ascending aortic aneurysm 4.1 cm in diameter. Recommend annual imaging followup by CTA or MRA. This recommendation follows 2010 ACCF/AHA/AATS/ACR/ASA/SCA/SCAI/SIR/STS/SVM Guidelines for the Diagnosis and Management of Patients with Thoracic Aortic Disease. Circulation. 2010; 121: P509-T267 3. Coronary atherosclerosis with mild cardiomegaly. 4. Prominent central pulmonary arterial structures probably reflecting pulmonary articular hypertension. 5. Cholelithiasis.   Electronically Signed  By: Sherryl Barters M.D.  On: 07/20/2014 13:07  Impression: 74 year old woman who is now 2 years out from a right upper lobectomy for stage IB non-small cell carcinoma. She has no evidence of recurrence. She does have bilateral groundglass opacities throughout the lungs. The largest is in the left upper lobe. These are essentially unchanged over the past 6 months. This could very well represent multifocal adenocarcinoma in situ. They bear continued close follow-up. I do not see an indication for intervention at the present time.  She also has a 4.1 cm ascending aortic aneurysm. This has not changed in the 6 month interval since her last scan. This needs continued follow-up as well.  Her blood pressure was elevated at 150/95. She did not take her blood pressure medicine this morning as she had to take the relative for surgery at 4:30 in the morning. I says the importance of compliance with her blood pressure medication and blood pressure control as regards the ascending aneurysm.  Plan:  Return in 6 months with CT of chest to follow-up groundglass opacities

## 2014-12-31 ENCOUNTER — Other Ambulatory Visit: Payer: Self-pay | Admitting: Thoracic Surgery (Cardiothoracic Vascular Surgery)

## 2014-12-31 DIAGNOSIS — I7121 Aneurysm of the ascending aorta, without rupture: Secondary | ICD-10-CM

## 2014-12-31 DIAGNOSIS — I712 Thoracic aortic aneurysm, without rupture: Secondary | ICD-10-CM

## 2015-01-04 ENCOUNTER — Other Ambulatory Visit: Payer: Self-pay | Admitting: Thoracic Surgery (Cardiothoracic Vascular Surgery)

## 2015-01-19 DIAGNOSIS — M1990 Unspecified osteoarthritis, unspecified site: Secondary | ICD-10-CM | POA: Diagnosis not present

## 2015-01-19 DIAGNOSIS — M810 Age-related osteoporosis without current pathological fracture: Secondary | ICD-10-CM | POA: Diagnosis not present

## 2015-01-19 DIAGNOSIS — J449 Chronic obstructive pulmonary disease, unspecified: Secondary | ICD-10-CM | POA: Diagnosis not present

## 2015-01-19 DIAGNOSIS — I10 Essential (primary) hypertension: Secondary | ICD-10-CM | POA: Diagnosis not present

## 2015-02-01 ENCOUNTER — Ambulatory Visit
Admission: RE | Admit: 2015-02-01 | Discharge: 2015-02-01 | Disposition: A | Discharge status: Home / Self Care | Payer: Medicare Other | Source: Ambulatory Visit | Attending: Thoracic Surgery (Cardiothoracic Vascular Surgery) | Admitting: Thoracic Surgery (Cardiothoracic Vascular Surgery)

## 2015-02-01 ENCOUNTER — Ambulatory Visit (INDEPENDENT_AMBULATORY_CARE_PROVIDER_SITE_OTHER): Payer: Medicare Other | Admitting: Thoracic Surgery (Cardiothoracic Vascular Surgery)

## 2015-02-01 ENCOUNTER — Encounter: Payer: Self-pay | Admitting: Thoracic Surgery (Cardiothoracic Vascular Surgery)

## 2015-02-01 VITALS — BP 127/80 | HR 60 | Resp 20 | Ht 70.0 in | Wt 175.0 lb

## 2015-02-01 DIAGNOSIS — I7121 Aneurysm of the ascending aorta, without rupture: Secondary | ICD-10-CM

## 2015-02-01 DIAGNOSIS — Z85118 Personal history of other malignant neoplasm of bronchus and lung: Secondary | ICD-10-CM | POA: Diagnosis not present

## 2015-02-01 DIAGNOSIS — R918 Other nonspecific abnormal finding of lung field: Secondary | ICD-10-CM | POA: Diagnosis not present

## 2015-02-01 DIAGNOSIS — C3491 Malignant neoplasm of unspecified part of right bronchus or lung: Secondary | ICD-10-CM | POA: Diagnosis not present

## 2015-02-01 DIAGNOSIS — I712 Thoracic aortic aneurysm, without rupture: Secondary | ICD-10-CM

## 2015-02-01 DIAGNOSIS — Z9889 Other specified postprocedural states: Secondary | ICD-10-CM | POA: Diagnosis not present

## 2015-02-01 DIAGNOSIS — Z902 Acquired absence of lung [part of]: Secondary | ICD-10-CM

## 2015-02-01 NOTE — Progress Notes (Signed)
[address]       ,[place] [address]             [phone]       HPI:  Mrs. Taketa returns for a scheduled 6 month follow-up visit.  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 She was last seen in the office in January of this year. Her CT at that time showed the continued presence of multiple groundglass opacities bilaterally. There was a question of a slight increase in size of the nodule in the left upper lobe. There was no suspicious adenopathy. She also had a 4.1 cm ascending aortic aneurysm.  She had a relatively light smoking history. She quit altogether in 2015. She has not had any issues with her breathing. She denies wheezing. She does have an occasional cough. She denies hemoptysis. She is not having any chest pain or shortness of breath. Her weight has been stable although it fluctuates a bit. She's not had any unusual headaches or visual changes. She does not have any new bone or joint pain.  Past Medical History  Diagnosis Date  . Tobacco abuse   . Arthritis   . Psoriasis     Ankles  . Constipation     corrects with diet  . Floater, vitreous     Right  . Atrial fib/flutter, transient     post-op  . Ascending aortic aneurysm 07/20/2014    4.1 cm by CT  . Nonsquamous nonsmall cell neoplasm of lung 09/2011    Stage IB- s/p right upper lobectomy   Past Surgical History  Procedure Laterality Date  . Right vats (upper lobectomy)  08/29/11    Dr Roxan Hockey  . Colonoscopy  01/22/2012    Procedure: COLONOSCOPY;  Surgeon: Danie Binder, MD;  [facility];  Service: Endoscopy;  Laterality: N/A;  10:45      Current Outpatient Prescriptions  Medication Sig Dispense Refill  . alendronate (FOSAMAX) 70 MG tablet Take 70 mg by mouth once a week.   3  . aspirin 81 MG tablet Take 81 mg by mouth daily.     . calcium-vitamin D  250-100 MG-UNIT per tablet Take 1 tablet by mouth daily.     . Cyanocobalamin (VITAMIN B 12 PO) Take by mouth daily.    Marland Kitchen glucosamine-chondroitin 500-400 MG tablet Take 1 tablet by mouth daily.    . meclizine (ANTIVERT) 12.5 MG tablet Take 12.5 mg by mouth 2 (two) times daily as needed for dizziness.    . metoprolol tartrate (LOPRESSOR) 25 MG tablet Take 25 mg by mouth 2 (two) times daily.     . Multiple Vitamins-Iron (MULTIVITAMINS WITH IRON) TABS Take 1 tablet by mouth daily.    . Omega-3 Fatty Acids (FISH OIL) 500 MG CAPS Take by mouth daily.     No current facility-administered medications for this visit.    Physical Exam BP 127/80 mmHg  Pulse 60  Resp 20  Ht '5\' 10"'$  (1.778 m)  Wt 175 lb (79.379 kg)  BMI 25.11 kg/m2  SpO81 11% 74 year old woman in no acute distress Alert and oriented 3 with no focal neurologic deficit No cervical or subclavicular adenopathy Lungs clear with equal breath sounds bilaterally Right chest incisions well healed Cardiac regular rate and rhythm normal S1 and S2 Abdomen soft nontender Extremities without clubbing cyanosis or edema, pulses intact  Diagnostic Tests: CT CHEST WITHOUT CONTRAST  TECHNIQUE: Multidetector CT imaging of the chest was performed following the standard protocol without IV contrast.  COMPARISON: 07/20/2014  FINDINGS: Mediastinum/Nodes: No axillary supraclavicular adenopathy. No mediastinal or hilar adenopathy. No pericardial fluid. Coronary calcifications noted.  Lungs/Pleura: Again demonstrated multiple bilateral rounded sub solid and ground-glass nodules .  The lesion of concern on previous exam in the left upper lobe continues to measure slightly larger at 15 x 9 mm (image 11, series 4) compared to an 13 x 7 mm on CT of 07/20/2014 and 10 mm x 7 mm on CT of 02/02/2014.  Example lesion in the right lung measures 12 mm on image 18, series 4 compared to 11 mm on prior.  Multiple additional bilateral lesions  are not changed.  Postsurgical change in the right upper lobe is unchanged.  Upper abdomen: No focal hepatic lesion on limited view of the liver on noncontrast exam. Adrenal glands are normal. Small gallstones noted.  Musculoskeletal: No aggressive osseous lesion  IMPRESSION: 1. Ground-glass nodule in the left upper lobe continues to measure several mm larger from comparison exams. Recommend continued CT surveillance. 2. Additional bilateral ground-glass and sub solid nodules are unchanged. 3. Stable postsurgical change in the right upper lobe.   Electronically Signed  By: Suzy Bouchard M.D.  On: 02/01/2015 12:09           Vitals     Height Weight BMI (Calculated)    '5\' 10"'$  (1.778 m) 175 lb (79.379 kg) 25.2      Interpretation Summary     CLINICAL DATA: Right lung cancer diagnosis 2013. Subsequent treatment strategy.  EXAM: CT CHEST WITHOUT CONTRAST  TECHNIQUE: Multidetector CT imaging of the chest was performed following the standard protocol without IV contrast.  COMPARISON: 07/20/2014  FINDINGS: Mediastinum/Nodes: No axillary supraclavicular adenopathy. No mediastinal or hilar adenopathy. No pericardial fluid. Coronary calcifications noted.  Lungs/Pleura: Again demonstrated multiple bilateral rounded sub solid and ground-glass nodules .  The lesion of concern on previous exam in the left upper lobe continues to measure slightly larger at 15 x 9 mm (image 11, series 4) compared to an 13 x 7 mm on CT of 07/20/2014 and 10 mm x 7 mm on CT of 02/02/2014.  Example lesion in the right lung measures 12 mm on image 18, series 4 compared to 11 mm on prior.  Multiple additional bilateral lesions are not changed.  Postsurgical change in the right upper lobe is unchanged.  Upper abdomen: No focal hepatic lesion on limited view of the liver on noncontrast exam. Adrenal glands are normal. Small  gallstones noted.  Musculoskeletal: No aggressive osseous lesion  IMPRESSION: 1. Ground-glass nodule in the left upper lobe continues to measure several mm larger from comparison exams. Recommend continued CT surveillance. 2. Additional bilateral ground-glass and sub solid nodules are unchanged. 3. Stable postsurgical change in the right upper lobe.   Electronically Signed  By: Suzy Bouchard M.D.  On: 02/01/2015 12:09     Impression: 74 year old woman with a history of stage IA non-small cell carcinoma who had a right upper lobectomy in 2013. She has multiple groundglass opacities bilaterally. These are stable for the most part, although there is a nodule in the left upper lobe that has definitely grown over the past year. It also appears slightly larger over the past 6 months.  These lesions are suspicious for multifocal adenocarcinoma in situ.  I discussed the options for management of the nodules with Mrs. Dolecki. The options  include continued radiographic follow-up. Left VATS for wedge resection of the upper lobe nodule. Bronchoscopic biopsy for diagnostic purposes prior to potential SBRT.  She is most in favor of surgical resection. However on a little concerned about that given multiple nodules and the inability to completely resect all of the nodules. These are relatively slow growing in 9 of them have a soft tissue component so it might just follow these nodules. SBRT might be reasonable to quickly for the larger nodule that has grown over time. I recommended that she see a radiation oncologist to discuss whether SBRT might be an option. She wishes to do that before she has a biopsy done. I will arrange for the consultation and then plan to see her back in a couple of weeks to see how she would like to proceed.  I also offered to refer her to a medical oncologist. She does not want to see one at this time.  Her ascending aortic aneurysm is stable at 4.1 cm. Her blood  pressure is well controlled.  Plan: Radiation oncology consult.  I will see her back in 3 weeks to further discuss treatment of her lung nodules.  [name], MD [facility] [phone]

## 2015-02-08 DIAGNOSIS — Z1231 Encounter for screening mammogram for malignant neoplasm of breast: Secondary | ICD-10-CM | POA: Diagnosis not present

## 2015-02-16 ENCOUNTER — Encounter: Payer: Self-pay | Admitting: Radiation Oncology

## 2015-02-16 DIAGNOSIS — R918 Other nonspecific abnormal finding of lung field: Secondary | ICD-10-CM | POA: Diagnosis not present

## 2015-02-16 DIAGNOSIS — M1990 Unspecified osteoarthritis, unspecified site: Secondary | ICD-10-CM | POA: Diagnosis not present

## 2015-02-16 DIAGNOSIS — C3412 Malignant neoplasm of upper lobe, left bronchus or lung: Secondary | ICD-10-CM | POA: Diagnosis not present

## 2015-02-16 DIAGNOSIS — Z85118 Personal history of other malignant neoplasm of bronchus and lung: Secondary | ICD-10-CM | POA: Diagnosis not present

## 2015-02-16 DIAGNOSIS — I10 Essential (primary) hypertension: Secondary | ICD-10-CM | POA: Diagnosis not present

## 2015-02-21 NOTE — Progress Notes (Signed)
I saw this patient in consultation in [employer] on 02/16/15.  She plans to return to follow-up with Dr Roxan Hockey to discuss biopsy of her left upper lung nodule.  She may be a candidate for ENB biopsy and fiducial placement, if the location is amenable that.

## 2015-02-22 ENCOUNTER — Ambulatory Visit: Payer: Medicare Other | Admitting: Thoracic Surgery (Cardiothoracic Vascular Surgery)

## 2015-03-08 ENCOUNTER — Ambulatory Visit (INDEPENDENT_AMBULATORY_CARE_PROVIDER_SITE_OTHER): Payer: Medicare Other | Admitting: Thoracic Surgery (Cardiothoracic Vascular Surgery)

## 2015-03-08 ENCOUNTER — Encounter: Payer: Self-pay | Admitting: Thoracic Surgery (Cardiothoracic Vascular Surgery)

## 2015-03-08 ENCOUNTER — Other Ambulatory Visit: Payer: Self-pay | Admitting: *Deleted

## 2015-03-08 VITALS — BP 147/85 | HR 51 | Resp 16 | Ht 70.0 in | Wt 175.0 lb

## 2015-03-08 DIAGNOSIS — R918 Other nonspecific abnormal finding of lung field: Secondary | ICD-10-CM | POA: Diagnosis not present

## 2015-03-08 DIAGNOSIS — I712 Thoracic aortic aneurysm, without rupture, unspecified: Secondary | ICD-10-CM

## 2015-03-08 DIAGNOSIS — Z902 Acquired absence of lung [part of]: Secondary | ICD-10-CM

## 2015-03-08 DIAGNOSIS — Z9889 Other specified postprocedural states: Secondary | ICD-10-CM | POA: Diagnosis not present

## 2015-03-08 DIAGNOSIS — C3491 Malignant neoplasm of unspecified part of right bronchus or lung: Secondary | ICD-10-CM | POA: Diagnosis not present

## 2015-03-08 NOTE — Progress Notes (Signed)
[address]       [place] [address]             [phone]       HPI: Ms. Linden returns for further discussion of her multiple lung nodules/groundglass opacities.  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 She has been followed with CT scans since that time. She has had multiple groundglass opacities bilaterally. Over time there has been a slow interval increase in size of the nodule in the left upper lobe. There is no suspicious adenopathy. She also has a 4.1 cm ascending aortic aneurysm.  She had a relatively light smoking history. She quit altogether in 2015. She has not had any issues with her breathing. She denies wheezing. She does have an occasional cough. She denies hemoptysis. She is not having any chest pain or shortness of breath. Her weight has been stable although it fluctuates a bit. She's not had any unusual headaches or visual changes. She does not have any new bone or joint pain.  When I saw her in July her CT scan showed the left upper lobe nodules once again slightly larger. There still was no significant soft tissue component. We discussed continued radiographic follow-up versus surgical resection versus radiation. She initially favored surgery but wanted to talk to radiation oncology before making a final decision. She saw Dr. Tammi Klippel in [place]. After discussion with Dr. Tammi Klippel she is interested in pursuing radiation therapy.  Past Medical History  Diagnosis Date  . Tobacco abuse   . Arthritis   . Psoriasis     Ankles  . Constipation     corrects with diet  . Floater, vitreous     Right  . Atrial fib/flutter, transient     post-op  . Ascending aortic aneurysm 07/20/2014    4.1 cm by CT  . Nonsquamous nonsmall cell neoplasm of lung 09/2011    Stage IB- s/p right upper lobectomy   Past Surgical History  Procedure Laterality  Date  . Right vats (upper lobectomy)  08/29/11    Dr Roxan Hockey  . Colonoscopy  01/22/2012    Procedure: COLONOSCOPY;  Surgeon: Danie Binder, MD;  [facility];  Service: Endoscopy;  Laterality: N/A;  10:45   Family History  Problem Relation Age of Onset  . Hypertension Mother   . Heart disease Mother   . Heart disease Sister   . Heart disease Brother    Social History   Social History  . Marital Status: Widowed    Spouse Name: N/A  . Number of Children: 3  . Years of Education: N/A   Occupational History  . retired    Social History Main Topics  . Smoking status: Former Smoker -- 0.30 packs/day for 20 years    Types: Cigarettes    Quit date: 01/12/2014  . Smokeless tobacco: Never Used  . Alcohol Use: No  . Drug Use: No  . Sexual Activity: Not on file   Other Topics Concern  . Not on file   Social History Narrative   Lives w/ cousin     Current Outpatient Prescriptions  Medication Sig Dispense Refill  . alendronate (FOSAMAX) 70 MG tablet Take 70 mg by mouth once a week.   3  . aspirin 81 MG tablet Take 81 mg by mouth daily.     . calcium-vitamin D 250-100  MG-UNIT per tablet Take 1 tablet by mouth daily.     . Cyanocobalamin (VITAMIN B 12 PO) Take by mouth daily.    Marland Kitchen glucosamine-chondroitin 500-400 MG tablet Take 1 tablet by mouth daily.    . meclizine (ANTIVERT) 12.5 MG tablet Take 12.5 mg by mouth 2 (two) times daily as needed for dizziness.    . metoprolol tartrate (LOPRESSOR) 25 MG tablet Take 25 mg by mouth 2 (two) times daily.     . Multiple Vitamins-Iron (MULTIVITAMINS WITH IRON) TABS Take 1 tablet by mouth daily.    . Omega-3 Fatty Acids (FISH OIL) 500 MG CAPS Take by mouth daily.     No current facility-administered medications for this visit.    Physical Exam BP 147/85 mmHg  Pulse 51  Resp 16  Ht '5\' 10"'$  (1.778 m)  Wt 175 lb (79.379 kg)  BMI 25.11 kg/m2  SpO2 97% Elderly woman in no acute distress Well-developed  well-nourished Alert and oriented 3 with no focal deficits Cardiac regular rate and rhythm no murmur Lungs clear with equal breath sounds bilaterally Abdomen soft nontender No clubbing, cyanosis or edema  Diagnostic Tests: CT CHEST WITHOUT CONTRAST  TECHNIQUE: Multidetector CT imaging of the chest was performed following the standard protocol without IV contrast.  COMPARISON: 07/20/2014  FINDINGS: Mediastinum/Nodes: No axillary supraclavicular adenopathy. No mediastinal or hilar adenopathy. No pericardial fluid. Coronary calcifications noted.  Lungs/Pleura: Again demonstrated multiple bilateral rounded sub solid and ground-glass nodules .  The lesion of concern on previous exam in the left upper lobe continues to measure slightly larger at 15 x 9 mm (image 11, series 4) compared to an 13 x 7 mm on CT of 07/20/2014 and 10 mm x 7 mm on CT of 02/02/2014.  Example lesion in the right lung measures 12 mm on image 18, series 4 compared to 11 mm on prior.  Multiple additional bilateral lesions are not changed.  Postsurgical change in the right upper lobe is unchanged.  Upper abdomen: No focal hepatic lesion on limited view of the liver on noncontrast exam. Adrenal glands are normal. Small gallstones noted.  Musculoskeletal: No aggressive osseous lesion  IMPRESSION: 1. Ground-glass nodule in the left upper lobe continues to measure several mm larger from comparison exams. Recommend continued CT surveillance. 2. Additional bilateral ground-glass and sub solid nodules are unchanged. 3. Stable postsurgical change in the right upper lobe.   Electronically Signed  By: Suzy Bouchard M.D.  On: 02/01/2015 12:09  I personally reviewed the CT scan again today and concur with the above findings.  Impression: 74 year old woman with a history of a stage IB non-small cell carcinoma who has multiple groundglass opacities. These are stable for the most part.  However, there is a groundglass opacity in the left upper lobe that has gradually increased in size over time. This is highly suspicious for adenocarcinoma in situ. There is no definite invasion based on CT findings. This also could be an inflammatory process.  Once again discussed the options of continued radiographic follow-up, surgical resection, and radiation therapy. I'm reluctant to go with surgical resection due to the multifocal nature of the process and her previous surgery on the right side. She favors radiation therapy. Dr. Tammi Klippel wishes to have a biopsy prior to treatment.  I recommended that we do electromagnetic navigational bronchoscopy for biopsy and fiducial placement. I described the general nature of the procedure, the need for general anesthesia, the expectation that she will go home the same day. We discussed the  indications, risks, benefits, and alternatives. She understands that the risks include, but are not limited to those associated with general anesthesia, which in her instances could result in MI, stroke, or death. She understands the procedure specific risk include bleeding, pneumothorax, malposition of fiducials, and failure to make a diagnosis (20%). She accepts the risks and wishes to proceed with surgery.  Plan:  Electromagnetic navigational bronchoscopy with biopsy and fiducial placement on Friday, September 9.  I spent 15 minutes with Mrs. Girten during this visit, greater than 50% was spent counseling  [name], MD [facility] [phone]

## 2015-03-16 ENCOUNTER — Encounter (HOSPITAL_COMMUNITY): Payer: Self-pay

## 2015-03-16 ENCOUNTER — Ambulatory Visit (HOSPITAL_COMMUNITY)
Admission: RE | Admit: 2015-03-16 | Discharge: 2015-03-16 | Disposition: A | Discharge status: Home / Self Care | Payer: Medicare Other | Source: Ambulatory Visit | Attending: Thoracic Surgery (Cardiothoracic Vascular Surgery) | Admitting: Thoracic Surgery (Cardiothoracic Vascular Surgery)

## 2015-03-16 ENCOUNTER — Encounter (HOSPITAL_COMMUNITY)
Admission: RE | Admit: 2015-03-16 | Discharge: 2015-03-16 | Disposition: A | Discharge status: Home / Self Care | Payer: Medicare Other | Source: Ambulatory Visit | Attending: Thoracic Surgery (Cardiothoracic Vascular Surgery) | Admitting: Thoracic Surgery (Cardiothoracic Vascular Surgery)

## 2015-03-16 VITALS — BP 131/88 | HR 57 | Temp 98.5°F | Resp 18 | Ht 66.5 in | Wt 180.1 lb

## 2015-03-16 DIAGNOSIS — Z85118 Personal history of other malignant neoplasm of bronchus and lung: Secondary | ICD-10-CM | POA: Insufficient documentation

## 2015-03-16 DIAGNOSIS — Z79899 Other long term (current) drug therapy: Secondary | ICD-10-CM | POA: Insufficient documentation

## 2015-03-16 DIAGNOSIS — R001 Bradycardia, unspecified: Secondary | ICD-10-CM | POA: Diagnosis not present

## 2015-03-16 DIAGNOSIS — I712 Thoracic aortic aneurysm, without rupture: Secondary | ICD-10-CM | POA: Insufficient documentation

## 2015-03-16 DIAGNOSIS — I517 Cardiomegaly: Secondary | ICD-10-CM | POA: Diagnosis not present

## 2015-03-16 DIAGNOSIS — R918 Other nonspecific abnormal finding of lung field: Secondary | ICD-10-CM | POA: Diagnosis not present

## 2015-03-16 DIAGNOSIS — Z7983 Long term (current) use of bisphosphonates: Secondary | ICD-10-CM | POA: Insufficient documentation

## 2015-03-16 DIAGNOSIS — L409 Psoriasis, unspecified: Secondary | ICD-10-CM | POA: Diagnosis not present

## 2015-03-16 DIAGNOSIS — Z01818 Encounter for other preprocedural examination: Secondary | ICD-10-CM | POA: Diagnosis not present

## 2015-03-16 DIAGNOSIS — Z87891 Personal history of nicotine dependence: Secondary | ICD-10-CM | POA: Diagnosis not present

## 2015-03-16 DIAGNOSIS — Z7982 Long term (current) use of aspirin: Secondary | ICD-10-CM | POA: Insufficient documentation

## 2015-03-16 DIAGNOSIS — I10 Essential (primary) hypertension: Secondary | ICD-10-CM | POA: Diagnosis not present

## 2015-03-16 DIAGNOSIS — Z902 Acquired absence of lung [part of]: Secondary | ICD-10-CM | POA: Diagnosis not present

## 2015-03-16 DIAGNOSIS — Z01812 Encounter for preprocedural laboratory examination: Secondary | ICD-10-CM | POA: Diagnosis not present

## 2015-03-16 HISTORY — DX: Essential (primary) hypertension: I10

## 2015-03-16 HISTORY — DX: Pneumonia, unspecified organism: J18.9

## 2015-03-16 LAB — COMPREHENSIVE METABOLIC PANEL
ALT: 12 U/L — ABNORMAL LOW (ref 14–54)
ANION GAP: 5 (ref 5–15)
AST: 21 U/L (ref 15–41)
Albumin: 3.7 g/dL (ref 3.5–5.0)
Alkaline Phosphatase: 60 U/L (ref 38–126)
BILIRUBIN TOTAL: 0.7 mg/dL (ref 0.3–1.2)
BUN: 10 mg/dL (ref 6–20)
CO2: 30 mmol/L (ref 22–32)
Calcium: 9.1 mg/dL (ref 8.9–10.3)
Chloride: 103 mmol/L (ref 101–111)
Creatinine, Ser: 1 mg/dL (ref 0.44–1.00)
GFR, EST NON AFRICAN AMERICAN: 54 mL/min — AB (ref >60)
Glucose, Bld: 93 mg/dL (ref 65–99)
POTASSIUM: 4.1 mmol/L (ref 3.5–5.1)
Sodium: 138 mmol/L (ref 135–145)
TOTAL PROTEIN: 6.8 g/dL (ref 6.5–8.1)

## 2015-03-16 LAB — CBC
HCT: 35.8 % — ABNORMAL LOW (ref 36.0–46.0)
Hemoglobin: 11.9 g/dL — ABNORMAL LOW (ref 12.0–15.0)
MCH: 30.4 pg (ref 26.0–34.0)
MCHC: 33.2 g/dL (ref 30.0–36.0)
MCV: 91.6 fL (ref 78.0–100.0)
Platelets: 207 10*3/uL (ref 150–400)
RBC: 3.91 MIL/uL (ref 3.87–5.11)
RDW: 14.2 % (ref 11.5–15.5)
WBC: 7.3 10*3/uL (ref 4.0–10.5)

## 2015-03-16 LAB — PROTIME-INR
INR: 1.05 (ref 0.00–1.49)
PROTHROMBIN TIME: 14 s (ref 11.6–15.2)

## 2015-03-16 LAB — SURGICAL PCR SCREEN
MRSA, PCR: POSITIVE — AB
Staphylococcus aureus: POSITIVE — AB

## 2015-03-16 LAB — APTT: aPTT: 32 seconds (ref 24–37)

## 2015-03-16 NOTE — Pre-Procedure Instructions (Signed)
ADDALEE KAVANAGH  03/16/2015      [facility] - [place], [place], [place] - [address] [place] [address] Phone: [phone] Fax: [phone]    Your procedure is scheduled on Sept 9th .  Report to [facility] Admitting at 1000 A.M.  Call this number if you have problems the morning of surgery:  [phone]   Remember:  Do not eat food or drink liquids after midnight.  Take these medicines the morning of surgery with A SIP OF WATER  Metoprolol tartrate (Lopressor)  Stop taking aspirin, Aleve, BC's, Goody's, Herbal medications, Fish Oil    Do not wear jewelry, make-up or nail polish.  Do not wear lotions, powders, or perfumes.  You may wear deodorant.  Do not shave 48 hours prior to surgery.  .  Do not bring valuables to the hospital.  [facility] is not responsible for any belongings or valuables.  Contacts, dentures or bridgework may not be worn into surgery.  Leave your suitcase in the car.  After surgery it may be brought to your room.  For patients admitted to the hospital, discharge time will be determined by your treatment team.  Patients discharged the day of surgery will not be allowed to drive home.    Special instructions:  [facility] - Preparing for Surgery  Before surgery, you can play an important role.  Because skin is not sterile, your skin needs to be as free of germs as possible.  You can reduce the number of germs on you skin by washing with CHG (chlorahexidine gluconate) soap before surgery.  CHG is an antiseptic cleaner which kills germs and bonds with the skin to continue killing germs even after washing.  Please DO NOT use if you have an allergy to CHG or antibacterial soaps.  If your skin becomes reddened/irritated stop using the CHG and inform your nurse when you arrive at Short Stay.  Do not shave (including legs and underarms) for at least 48 hours prior to the first CHG shower.  You may shave your  face.  Please follow these instructions carefully:   1.  Shower with CHG Soap the night before surgery and the                                morning of Surgery.  2.  If you choose to wash your hair, wash your hair first as usual with your       normal shampoo.  3.  After you shampoo, rinse your hair and body thoroughly to remove the                      Shampoo.  4.  Use CHG as you would any other liquid soap.  You can apply chg directly       to the skin and wash gently with scrungie or a clean washcloth.  5.  Apply the CHG Soap to your body ONLY FROM THE NECK DOWN.        Do not use on open wounds or open sores.  Avoid contact with your eyes,       ears, mouth and genitals (private parts).  Wash genitals (private parts)       with your normal soap.  6.  Wash thoroughly, paying special attention to the area where your surgery  will be performed.  7.  Thoroughly rinse your body with warm water from the neck down.  8.  DO NOT shower/wash with your normal soap after using and rinsing off       the CHG Soap.  9.  Pat yourself dry with a clean towel.            10.  Wear clean pajamas.            11.  Place clean sheets on your bed the night of your first shower and do not        sleep with pets.  Day of Surgery  Do not apply any lotions/deoderants the morning of surgery.  Please wear clean clothes to the hospital/surgery center.     Please read over the following fact sheets that you were given. Pain Booklet, Coughing and Deep Breathing, MRSA Information and Surgical Site Infection Prevention

## 2015-03-16 NOTE — Progress Notes (Signed)
PCP is Dr. Legrand Rams in [place] Denies seeing a cardiologist Denies ever having a stress test, echo, or card cath Denies any chest pain or shortness of breath.

## 2015-03-17 NOTE — Progress Notes (Addendum)
Anesthesia Chart Review: Patient is a 74 year old female scheduled for video bronchoscopy with ENB and fiducial placement on 03/18/15 by Dr. Roxan Hockey. Case is posted for 12 Noon.  History includes lung cancer s/p RU lobectomy with mediastinal LN dissection 09/24/11 with transient post-operative afib responsive to b-blocker therapy, recent former smoker (quit 01/12/14), psoriasis, right eye vitreous floater, 4.1 cm ascending aortic aneurysm 07/2014, HTN, PNA, arthritis. PCP is Dr. Legrand Rams in [place].   Patient denied seeing a cardiologist and denied cardiac testing, although echo from 03/2012 was found scanned into our system. I have requested records from [facility]) since that is where she had an abnormal echo in 2013. She was hospitalized with viral gastroenteritis with UTI and E. Coli bacteremia from 03/12/12-03/16/12. She had an episode of rapid afib and cardiologist Dr. Domenic Polite was consulted. He ordered the echo with further recommendations pending the results. The discharge summary does not mention the echo or cardiology recommendations, and Epic does not indicate that she has had follow-up with Dr. Domenic Polite since.   PAT Vitals: T 36.9C, HR 57 (47 by EKG), RR 18, BP 131/88, 100%.  Meds include Fosamax, ASA 81 mg, Antivert, Lopressor, fish oil, MVI with iron.  03/16/15 EKG: SB at 47 bpm, minimal voltage criteria for LVH, may be normal variant. SB has replaced SR with PACs when compared to tracing from 09/26/11.   03/14/12 Echo (done at [facility] and read by Dr. Zigmund Gottron; indications afib, weakness): LV chamber size is mildly dilated. Mild concentric LVH. Global hypokinesis of the LV with minor regional variation. There is moderately decreased LV systolic function. Moderately decreased LV systolic function with estimated EF 30-35%. Abnormal LV diastolic filling is observed, consistent with impaired relaxation. The basal anterolateral and mid anterolateral wall segments are akinetic (score 3). LA is  moderately dilated. Mitral annular calcification. Mild MR. Mild aortic leaflet calcification is visualized. Trace AR. Mean gradient of the AV is 3 mmHg. RV global systolic function is mildly reduced. Mild TR. RVSP is calculated at 54 mmHg. There is evidence of moderate pulmonary hypertension. Mild pulmonic regurgitation.   03/16/15 CXR: IMPRESSION: The ground-glass opacities noted on CT of the chest are not visible by chest x-ray. No active lung disease is seen.  Preoperative labs noted.   I discussed with anesthesiologist Dr. Kalman Shan. Ideally would like to get a repeat echo prior to surgery, or could consider cardiology consult. Patient is scheduled for 12 Noon, so perhaps this could be arranged. I notified [facility]CTS RN Thurmond Butts who will review with Dr. Roxan Hockey.  George Hugh [facility] Short Stay Center/Anesthesiology Phone [phone] 03/17/2015 2:01 PM  Addendum: I received a voice message from [place]. She spoke with Rosaria Ferries, PA-C with [facility]. They are instructing patient to arrive another hour earlier tomorrow to allow for cardiology evaluation and STAT bedside echo.  George Hugh [facility] Short Stay Center/Anesthesiology Phone [phone] 03/17/2015 2:36 PM

## 2015-03-18 ENCOUNTER — Ambulatory Visit (HOSPITAL_COMMUNITY)
Admission: RE | Admit: 2015-03-18 | Discharge: 2015-03-18 | Disposition: A | Discharge status: Home / Self Care | Payer: Medicare Other | Source: Ambulatory Visit | Attending: Thoracic Surgery (Cardiothoracic Vascular Surgery) | Admitting: Thoracic Surgery (Cardiothoracic Vascular Surgery)

## 2015-03-18 ENCOUNTER — Ambulatory Visit (HOSPITAL_COMMUNITY): Payer: Medicare Other | Admitting: Vascular Surgery

## 2015-03-18 ENCOUNTER — Ambulatory Visit (HOSPITAL_COMMUNITY): Payer: Medicare Other

## 2015-03-18 ENCOUNTER — Ambulatory Visit (HOSPITAL_BASED_OUTPATIENT_CLINIC_OR_DEPARTMENT_OTHER): Payer: Medicare Other

## 2015-03-18 ENCOUNTER — Ambulatory Visit (HOSPITAL_COMMUNITY): Payer: Medicare Other | Admitting: Certified Registered Nurse Anesthetist

## 2015-03-18 ENCOUNTER — Encounter (HOSPITAL_COMMUNITY)
Admission: RE | Disposition: A | Discharge status: Home / Self Care | Payer: Self-pay | Source: Ambulatory Visit | Attending: Thoracic Surgery (Cardiothoracic Vascular Surgery)

## 2015-03-18 ENCOUNTER — Encounter (HOSPITAL_COMMUNITY): Payer: Self-pay | Admitting: General Practice

## 2015-03-18 DIAGNOSIS — I5022 Chronic systolic (congestive) heart failure: Secondary | ICD-10-CM

## 2015-03-18 DIAGNOSIS — Z419 Encounter for procedure for purposes other than remedying health state, unspecified: Secondary | ICD-10-CM

## 2015-03-18 DIAGNOSIS — I10 Essential (primary) hypertension: Secondary | ICD-10-CM | POA: Insufficient documentation

## 2015-03-18 DIAGNOSIS — I712 Thoracic aortic aneurysm, without rupture: Secondary | ICD-10-CM | POA: Diagnosis not present

## 2015-03-18 DIAGNOSIS — R918 Other nonspecific abnormal finding of lung field: Secondary | ICD-10-CM

## 2015-03-18 DIAGNOSIS — Z7982 Long term (current) use of aspirin: Secondary | ICD-10-CM | POA: Diagnosis not present

## 2015-03-18 DIAGNOSIS — Z01810 Encounter for preprocedural cardiovascular examination: Secondary | ICD-10-CM

## 2015-03-18 DIAGNOSIS — Z87891 Personal history of nicotine dependence: Secondary | ICD-10-CM | POA: Insufficient documentation

## 2015-03-18 DIAGNOSIS — M1990 Unspecified osteoarthritis, unspecified site: Secondary | ICD-10-CM | POA: Diagnosis not present

## 2015-03-18 DIAGNOSIS — R911 Solitary pulmonary nodule: Secondary | ICD-10-CM | POA: Diagnosis not present

## 2015-03-18 HISTORY — PX: VIDEO BRONCHOSCOPY WITH ENDOBRONCHIAL NAVIGATION: SHX6175

## 2015-03-18 SURGERY — VIDEO BRONCHOSCOPY WITH ENDOBRONCHIAL NAVIGATION
Anesthesia: General

## 2015-03-18 MED ORDER — EPHEDRINE SULFATE 50 MG/ML IJ SOLN
INTRAMUSCULAR | Status: DC | PRN
  Administered 2015-03-18: 10 mg via INTRAVENOUS

## 2015-03-18 MED ORDER — FENTANYL CITRATE (PF) 100 MCG/2ML IJ SOLN
INTRAMUSCULAR | Status: DC | PRN
Start: 2015-03-18 — End: 2015-03-18
  Administered 2015-03-18: 50 ug via INTRAVENOUS
  Administered 2015-03-18: 100 ug via INTRAVENOUS

## 2015-03-18 MED ORDER — ROCURONIUM BROMIDE 50 MG/5ML IV SOLN
INTRAVENOUS | Status: AC
  Filled 2015-03-18: qty 1

## 2015-03-18 MED ORDER — LACTATED RINGERS IV SOLN
INTRAVENOUS | Status: DC
Start: 2015-03-18 — End: 2015-03-18
  Administered 2015-03-18 (×2): via INTRAVENOUS

## 2015-03-18 MED ORDER — GLYCOPYRROLATE 0.2 MG/ML IJ SOLN
INTRAMUSCULAR | Status: DC | PRN
  Administered 2015-03-18: .8 mg via INTRAVENOUS

## 2015-03-18 MED ORDER — MIDAZOLAM HCL 5 MG/5ML IJ SOLN
INTRAMUSCULAR | Status: DC | PRN
  Administered 2015-03-18: 2 mg via INTRAVENOUS

## 2015-03-18 MED ORDER — LIDOCAINE HCL (CARDIAC) 20 MG/ML IV SOLN
INTRAVENOUS | Status: DC | PRN
  Administered 2015-03-18: 60 mg via INTRAVENOUS

## 2015-03-18 MED ORDER — ROCURONIUM BROMIDE 100 MG/10ML IV SOLN
INTRAVENOUS | Status: DC | PRN
  Administered 2015-03-18: 30 mg via INTRAVENOUS

## 2015-03-18 MED ORDER — NEOSTIGMINE METHYLSULFATE 10 MG/10ML IV SOLN
INTRAVENOUS | Status: DC | PRN
Start: 2015-03-18 — End: 2015-03-18
  Administered 2015-03-18: 4 mg via INTRAVENOUS

## 2015-03-18 MED ORDER — ONDANSETRON HCL 4 MG/2ML IJ SOLN
INTRAMUSCULAR | Status: DC | PRN
  Administered 2015-03-18: 4 mg via INTRAVENOUS

## 2015-03-18 MED ORDER — FENTANYL CITRATE (PF) 250 MCG/5ML IJ SOLN
INTRAMUSCULAR | Status: AC
  Filled 2015-03-18: qty 5

## 2015-03-18 MED ORDER — MIDAZOLAM HCL 2 MG/2ML IJ SOLN
INTRAMUSCULAR | Status: AC
  Filled 2015-03-18: qty 4

## 2015-03-18 MED ORDER — LIDOCAINE HCL (CARDIAC) 20 MG/ML IV SOLN
INTRAVENOUS | Status: AC
  Filled 2015-03-18: qty 5

## 2015-03-18 MED ORDER — PROPOFOL 10 MG/ML IV BOLUS
INTRAVENOUS | Status: DC | PRN
  Administered 2015-03-18: 140 mg via INTRAVENOUS

## 2015-03-18 MED ORDER — PROPOFOL 10 MG/ML IV BOLUS
INTRAVENOUS | Status: AC
  Filled 2015-03-18: qty 20

## 2015-03-18 MED ORDER — EPINEPHRINE HCL 1 MG/ML IJ SOLN
INTRAMUSCULAR | Status: AC
  Filled 2015-03-18: qty 1

## 2015-03-18 MED ORDER — FENTANYL CITRATE (PF) 100 MCG/2ML IJ SOLN: 25.0000 ug | INTRAMUSCULAR | Status: DC | PRN

## 2015-03-18 MED ORDER — ONDANSETRON HCL 4 MG/2ML IJ SOLN
INTRAMUSCULAR | Status: AC
  Filled 2015-03-18: qty 2

## 2015-03-18 MED ORDER — 0.9 % SODIUM CHLORIDE (POUR BTL) OPTIME
TOPICAL | Status: DC | PRN
  Administered 2015-03-18: 1000 mL

## 2015-03-18 SURGICAL SUPPLY — 33 items
BRUSH SUPERTRAX BIOPSY (INSTRUMENTS) IMPLANT
BRUSH SUPERTRAX NDL-TIP CYTO (INSTRUMENTS) ×3 IMPLANT
CANISTER SUCTION 2500CC (MISCELLANEOUS) ×3 IMPLANT
CHANNEL WORK EXTEND EDGE 180 (KITS) IMPLANT
CHANNEL WORK EXTEND EDGE 45 (KITS) IMPLANT
CHANNEL WORK EXTEND EDGE 90 (KITS) IMPLANT
CONT SPEC 4OZ CLIKSEAL STRL BL (MISCELLANEOUS) ×6 IMPLANT
COVER TABLE BACK 60X90 (DRAPES) ×3 IMPLANT
FILTER STRAW FLUID ASPIR (MISCELLANEOUS) IMPLANT
FORCEPS BIOP SUPERTRX PREMAR (INSTRUMENTS) ×3 IMPLANT
GAUZE SPONGE 4X4 12PLY STRL (GAUZE/BANDAGES/DRESSINGS) ×3 IMPLANT
GLOVE SURG SIGNA 7.5 PF LTX (GLOVE) ×3 IMPLANT
GOWN STRL REUS W/ TWL XL LVL3 (GOWN DISPOSABLE) ×1 IMPLANT
GOWN STRL REUS W/TWL XL LVL3 (GOWN DISPOSABLE) ×2
KIT CLEAN ENDO COMPLIANCE (KITS) ×3 IMPLANT
KIT PROCEDURE EDGE 180 (KITS) ×3 IMPLANT
KIT PROCEDURE EDGE 45 (KITS) IMPLANT
KIT PROCEDURE EDGE 90 (KITS) IMPLANT
KIT ROOM TURNOVER OR (KITS) ×3 IMPLANT
MARKER SKIN DUAL TIP RULER LAB (MISCELLANEOUS) ×3 IMPLANT
NEEDLE SUPERTRX PREMARK BIOPSY (NEEDLE) ×3 IMPLANT
NS IRRIG 1000ML POUR BTL (IV SOLUTION) ×3 IMPLANT
OIL SILICONE PENTAX (PARTS (SERVICE/REPAIRS)) ×3 IMPLANT
PAD ARMBOARD 7.5X6 YLW CONV (MISCELLANEOUS) ×6 IMPLANT
PATCHES PATIENT (LABEL) ×9 IMPLANT
SYR 20CC LL (SYRINGE) ×3 IMPLANT
SYR 20ML ECCENTRIC (SYRINGE) ×3 IMPLANT
SYR 30ML LL (SYRINGE) ×3 IMPLANT
SYR 5ML LL (SYRINGE) ×3 IMPLANT
TOWEL OR 17X24 6PK STRL BLUE (TOWEL DISPOSABLE) ×3 IMPLANT
TRAP SPECIMEN MUCOUS 40CC (MISCELLANEOUS) ×3 IMPLANT
TUBE CONNECTING 20'X1/4 (TUBING) ×1
TUBE CONNECTING 20X1/4 (TUBING) ×2 IMPLANT

## 2015-03-18 NOTE — Progress Notes (Signed)
All documentation in this chart done under my name was done accidentally by Irma Newness, RN.

## 2015-03-18 NOTE — Anesthesia Procedure Notes (Signed)
Procedure Name: Intubation Date/Time: 03/18/2015 2:05 PM Performed by: Clearnce Sorrel Pre-anesthesia Checklist: Patient identified, Timeout performed, Emergency Drugs available, Suction available and Patient being monitored Patient Re-evaluated:Patient Re-evaluated prior to inductionOxygen Delivery Method: Circle system utilized Preoxygenation: Pre-oxygenation with 100% oxygen Intubation Type: IV induction Ventilation: Mask ventilation without difficulty and Oral airway inserted - appropriate to patient size Laryngoscope Size: Mac and 3 Grade View: Grade I Tube type: Oral Laser Tube: Cuffed inflated with minimal occlusive pressure - saline Tube size: 8.5 mm Number of attempts: 1 Airway Equipment and Method: Stylet Placement Confirmation: ETT inserted through vocal cords under direct vision,  breath sounds checked- equal and bilateral and positive ETCO2 Secured at: 23 cm Tube secured with: Tape Dental Injury: Teeth and Oropharynx as per pre-operative assessment

## 2015-03-18 NOTE — Anesthesia Postprocedure Evaluation (Signed)
  Anesthesia Post-op Note  Patient: Heather [name]  Procedure(s) Performed: Procedure(s): VIDEO BRONCHOSCOPY WITH ENDOBRONCHIAL NAVIGATION (N/A)  Patient Location: PACU  Anesthesia Type: General   Level of Consciousness: awake, alert  and oriented  Airway and Oxygen Therapy: Patient Spontanous Breathing  Post-op Pain: none  Post-op Assessment: Post-op Vital signs reviewed  Post-op Vital Signs: Reviewed  Last Vitals:  Filed Vitals:   03/18/15 1630  BP: 175/82  Pulse: 53  Temp: 36.3 C  Resp: 16    Complications: No apparent anesthesia complications

## 2015-03-18 NOTE — Interval H&P Note (Signed)
History and Physical Interval Note:  03/18/2015 1:27 PM  Heather [name]  has presented today for surgery, with the diagnosis of LUNG NODULES  The various methods of treatment have been discussed with the patient and family. After consideration of risks, benefits and other options for treatment, the patient has consented to  Procedure(s): [place] (N/A) PLACEMENT OF FUDUCIAL (N/A) as a surgical intervention .  The patient's history has been reviewed, patient examined, no change in status, stable for surgery.  I have reviewed the patient's chart and labs.  Questions were answered to the patient's satisfaction.     [name]

## 2015-03-18 NOTE — Brief Op Note (Signed)
03/18/2015  3:48 PM  PATIENT:  Heather [name]  74 y.o. female  PRE-OPERATIVE DIAGNOSIS:  LUNG NODULES  POST-OPERATIVE DIAGNOSIS:  LUNG NODULES  PROCEDURE:  ELECTROMAGNETIC NAVIGATIONAL BRONCHOSCOPY  Brushings, biopsies, needle aspirations, and BAL  SURGEON:  Surgeon(s) and Role:    * [name], MD - Primary    ANESTHESIA:   general  EBL:     BLOOD ADMINISTERED:none  DRAINS: none   LOCAL MEDICATIONS USED:  NONE  SPECIMEN:  Source of Specimen:  Left upper lobe nodule  DISPOSITION OF SPECIMEN:  Path and Micro  PLAN OF CARE: Discharge to home after PACU  PATIENT DISPOSITION:  PACU - hemodynamically stable.   Delay start of Pharmacological VTE agent (>24hrs) due to surgical blood loss or risk of bleeding: not applicable  FINDINGS- navigated to within 1 cm of LUL nodule. Quick prep- no tumor seen, vague granulomatous inflammation

## 2015-03-18 NOTE — Progress Notes (Signed)
  Echocardiogram 2D Echocardiogram has been performed.  Jennette Dubin 03/18/2015, 9:41 AM

## 2015-03-18 NOTE — Consult Note (Signed)
CONSULT NOTE  Date: 03/18/2015               Patient Name:  Heather [name] MRN: [id number]  DOB: 1940-09-22 Age / Sex: 74 y.o., female        PCP: Rosita Fire Primary Cardiologist: Domenic Polite            Referring Physician: Roxan Hockey              Reason for Consult: Pre-op eval prior to bronchoscopy            History of Present Illness: Patient is a 74 y.o. female with a PMHx of paroxysmal atrial fibrillation, lung cancer who was admitted to [facility] on 03/18/2015 for video bronchoscopy. A cardiology consultation was requested because of her cardiac history.  The patient has a history of lung cancer in the past and has been followed by Dr. Roxan Hockey. She's had some progression of her disease and is now scheduled for bronchoscopy. It was noted that she has a history of atrial fibrillation in 2013. An echo cardiac gram at that time revealed moderate left ventricle dysfunction with an ejection fraction of 30-35%.  The patient denies any episodes of chest pain or shortness of breath. She's able to do all of her normal activities without any significant problems. Not been limited by any cardiac issues.  Medications: Outpatient medications: Prescriptions prior to admission  Medication Sig Dispense Refill Last Dose  . alendronate (FOSAMAX) 70 MG tablet Take 70 mg by mouth once a week.   3 Taking  . aspirin 81 MG tablet Take 81 mg by mouth daily.    Taking  . calcium-vitamin D 250-100 MG-UNIT per tablet Take 1 tablet by mouth daily.    Taking  . Cyanocobalamin (VITAMIN B 12 PO) Take by mouth daily.   Taking  . glucosamine-chondroitin 500-400 MG tablet Take 1 tablet by mouth daily.   Taking  . meclizine (ANTIVERT) 12.5 MG tablet Take 12.5 mg by mouth 2 (two) times daily as needed for dizziness.   Taking  . metoprolol tartrate (LOPRESSOR) 25 MG tablet Take 25 mg by mouth 2 (two) times daily.    Taking  . Multiple Vitamins-Iron (MULTIVITAMINS WITH IRON) TABS Take 1 tablet by mouth  daily.   Taking  . Omega-3 Fatty Acids (FISH OIL) 500 MG CAPS Take by mouth daily.   Taking    Current medications: No current facility-administered medications for this encounter.     Allergies  Allergen Reactions  . Ibuprofen Nausea And Vomiting     Past Medical History  Diagnosis Date  . Tobacco abuse   . Arthritis   . Psoriasis     Ankles  . Constipation     corrects with diet  . Floater, vitreous     Right  . Atrial fib/flutter, transient     post-op  . Ascending aortic aneurysm 07/20/2014    4.1 cm by CT  . Nonsquamous nonsmall cell neoplasm of lung 09/2011    Stage IB- s/p right upper lobectomy  . Hypertension   . Pneumonia     Past Surgical History  Procedure Laterality Date  . Right vats (upper lobectomy)  08/29/11    Dr Roxan Hockey  . Colonoscopy  01/22/2012    Procedure: COLONOSCOPY;  Surgeon: Danie Binder, MD;  [facility];  Service: Endoscopy;  Laterality: N/A;  10:45    Family History  Problem Relation Age of Onset  . Hypertension Mother   .  Heart disease Mother   . Heart disease Sister   . Heart disease Brother     Social History:  reports that she quit smoking about 14 months ago. Her smoking use included Cigarettes. She has a 6 pack-year smoking history. She has never used smokeless tobacco. She reports that she drinks alcohol. She reports that she does not use illicit drugs.   Review of Systems: Constitutional:  denies fever, chills, diaphoresis, appetite change and fatigue.  HEENT: denies photophobia, eye pain, redness, hearing loss, ear pain, congestion, sore throat, rhinorrhea, sneezing, neck pain, neck stiffness and tinnitus.  Respiratory: denies SOB, DOE, cough, chest tightness, and wheezing.  Cardiovascular: denies chest pain, palpitations and leg swelling.  Gastrointestinal: denies nausea, vomiting, abdominal pain, diarrhea, constipation, blood in stool.  Genitourinary: denies dysuria, urgency, frequency, hematuria, flank  pain and difficulty urinating.  Musculoskeletal: denies  myalgias, back pain, joint swelling, arthralgias and gait problem.   Skin: denies pallor, rash and wound.  Neurological: denies dizziness, seizures, syncope, weakness, light-headedness, numbness and headaches.   Hematological: denies adenopathy, easy bruising, personal or family bleeding history.  Psychiatric/ Behavioral: denies suicidal ideation, mood changes, confusion, nervousness, sleep disturbance and agitation.    Physical Exam: BP 173/93 mmHg  Pulse 44  Temp(Src) 97 F (36.1 C) (Oral)  Resp 18  Wt 81.647 kg (180 lb)  SpO2 100%  Wt Readings from Last 3 Encounters:  03/18/15 81.647 kg (180 lb)  03/16/15 81.693 kg (180 lb 1.6 oz)  03/08/15 79.379 kg (175 lb)    General: Vital signs reviewed and noted. Well-developed, well-nourished, in no acute distress; alert,   Head: Normocephalic, atraumatic, sclera anicteric,   Neck: Supple. Negative for carotid bruits. No JVD   Lungs:  Clear bilaterally, no  wheezes, rales, or rhonchi. Breathing is normal   Heart: RRR with S1 S2. No murmurs, rubs, or gallops   Abdomen/ GI :  Soft, non-tender, non-distended with normoactive bowel sounds. No hepatomegaly. No rebound/guarding. No obvious abdominal masses   MSK: Strength and the appear normal for age.   Extremities: No clubbing or cyanosis. No edema.  Distal pedal pulses are 2+ and equal   Neurologic:  CN are grossly intact,  No obvious motor or sensory defect.  Alert and oriented X 3. Moves all extremities spontaneously.  Psych: Responds to questions appropriately with a normal affect.     Lab results: Basic Metabolic Panel:  Recent Labs Lab 03/16/15 1427  NA 138  K 4.1  CL 103  CO2 30  GLUCOSE 93  BUN 10  CREATININE 1.00  CALCIUM 9.1    Liver Function Tests:  Recent Labs Lab 03/16/15 1427  AST 21  ALT 12*  ALKPHOS 60  BILITOT 0.7  PROT 6.8  ALBUMIN 3.7   No results for input(s): LIPASE, AMYLASE in the last  168 hours. No results for input(s): AMMONIA in the last 168 hours.  CBC:  Recent Labs Lab 03/16/15 1427  WBC 7.3  HGB 11.9*  HCT 35.8*  MCV 91.6  PLT 207    Cardiac Enzymes: No results for input(s): CKTOTAL, CKMB, CKMBINDEX, TROPONINI in the last 168 hours.  BNP: Invalid input(s): POCBNP  CBG: No results for input(s): GLUCAP in the last 168 hours.  Coagulation Studies:  Recent Labs  03/16/15 1427  LABPROT 14.0  INR 1.05     Other results:  Personal review of EKG shows :  Normal sinus rhythm. She has no ST or T wave changes.   Imaging: Dg Chest 2  View  03/16/2015   CLINICAL DATA:  Preop for lung biopsy  EXAM: CHEST  2 VIEW  COMPARISON:  CT chest of 02/01/2015  FINDINGS: The ground-glass opacity in the left upper lobe noted by CT chest is not seen on chest x-ray. The smaller ground-glass opacity medially in the right lower lobe also is not visualized. No infiltrate or effusion is seen. Cardiomegaly is stable. There are degenerative changes throughout the thoracic spine.  IMPRESSION: The ground-glass opacities noted on CT of the chest are not visible by chest x-ray. No active lung disease is seen.   Electronically Signed   By: Ivar Drape M.D.   On: 03/16/2015 15:09          Assessment & Plan:  1. Chronic systolic congestive heart failure: The patient has a history of systolic heart failure. She had an echo card gram performed this morning. I have reviewed the images and the preliminary interpretation looks identical to the echo in 2013. She continues to have moderate LV dysfunction with an ejection fraction of around 35%.  She's not had any signs or symptoms of congestive heart failure and appears to be stable.  She's on metoprolol. She is not on an ACE inhibitor or ARB . We can initiate that as an outpatient as needed. Her blood pressure is moderately elevated but I do not think that there is any indication to start it  urgently today. She needs to follow up with  Dr. Domenic Polite in the [place] office.   2. History of paroxysmal atrial fibrillation: The patient is clearly in sinus rhythm today.  3. Preoperative evaluation:  The patient has been quite stable for several years. Her left ventricle systolic function is unchanged from her previous echo. She's not had any symptoms of angina. She's not had any exacerbations of congestive heart failure. She is in normal sinus rhythm.  I would place her at low to moderate risk for cardiovascular complications. This is primarily related to her history of chronic systolic congestive heart failure but she appears to be adequately compensated at this time. She does not need any further tuneup. She is able to lie flat without any difficulty.  She'll follow-up with Dr. Johnny Bridge.      Thayer Headings, Brooke Bonito., MD, [facility] 03/18/2015, 9:19 AM Office - [phone] Pager [phone]

## 2015-03-18 NOTE — Transfer of Care (Signed)
Immediate Anesthesia Transfer of Care Note  Patient: Heather [name]  Procedure(s) Performed: Procedure(s): VIDEO BRONCHOSCOPY WITH ENDOBRONCHIAL NAVIGATION (N/A)  Patient Location: PACU  Anesthesia Type:General  Level of Consciousness: awake, alert  and oriented  Airway & Oxygen Therapy: Patient Spontanous Breathing and Patient connected to nasal cannula oxygen  Post-op Assessment: Report given to RN and Post -op Vital signs reviewed and stable  Post vital signs: Reviewed and stable  Last Vitals:  Filed Vitals:   03/18/15 0822  BP: 173/93  Pulse: 44  Temp: 36.1 C  Resp: 18    Complications: No apparent anesthesia complications

## 2015-03-18 NOTE — Discharge Instructions (Signed)
Do not drive or engage in heavy physical activity for this evening  You may resume normal activities tomorrow  You may cough up small amounts of blood over the next few days.  Call [phone] if you develop chest pain, shortness of breath, fever > 101 F or cough up more than a tablespoon of blood.  My office will contact you next week with a follow up appointment

## 2015-03-18 NOTE — Anesthesia Preprocedure Evaluation (Addendum)
Anesthesia Evaluation  Patient identified by MRN, date of birth, ID band Patient awake    Reviewed: Allergy & Precautions, NPO status , Patient's Chart, lab work & pertinent test results, reviewed documented beta blocker date and time   History of Anesthesia Complications Negative for: history of anesthetic complications  Airway Mallampati: II  TM Distance: >3 FB Neck ROM: Full    Dental  (+) Edentulous Lower, Edentulous Upper   Pulmonary former smoker,  Lung cancer s/p vats    breath sounds clear to auscultation       Cardiovascular hypertension, Pt. on home beta blockers and Pt. on medications (-) angina+ Peripheral Vascular Disease and +CHF  (-) Past MI + dysrhythmias Atrial Fibrillation  Rhythm:Regular     Neuro/Psych negative neurological ROS  negative psych ROS   GI/Hepatic negative GI ROS, Neg liver ROS,   Endo/Other  negative endocrine ROS  Renal/GU negative Renal ROS     Musculoskeletal  (+) Arthritis , Osteoarthritis,    Abdominal   Peds  Hematology negative hematology ROS (+)   Anesthesia Other Findings   Reproductive/Obstetrics                           Anesthesia Physical Anesthesia Plan  ASA: III  Anesthesia Plan: General   Post-op Pain Management:    Induction: Intravenous  Airway Management Planned: Oral ETT  Additional Equipment: None  Intra-op Plan:   Post-operative Plan: Extubation in OR  Informed Consent: I have reviewed the patients History and Physical, chart, labs and discussed the procedure including the risks, benefits and alternatives for the proposed anesthesia with the patient or authorized representative who has indicated his/her understanding and acceptance.   Dental advisory given  Plan Discussed with: Surgeon and CRNA  Anesthesia Plan Comments:        Anesthesia Quick Evaluation

## 2015-03-18 NOTE — H&P (View-Only) (Signed)
[address]       [place] [address]             [phone]       HPI: Ms. Heather [name] returns for further discussion of her multiple lung nodules/groundglass opacities.  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 She has been followed with CT scans since that time. She has had multiple groundglass opacities bilaterally. Over time there has been a slow interval increase in size of the nodule in the left upper lobe. There is no suspicious adenopathy. She also has a 4.1 cm ascending aortic aneurysm.  She had a relatively light smoking history. She quit altogether in 2015. She has not had any issues with her breathing. She denies wheezing. She does have an occasional cough. She denies hemoptysis. She is not having any chest pain or shortness of breath. Her weight has been stable although it fluctuates a bit. She's not had any unusual headaches or visual changes. She does not have any new bone or joint pain.  When I saw her in July her CT scan showed the left upper lobe nodules once again slightly larger. There still was no significant soft tissue component. We discussed continued radiographic follow-up versus surgical resection versus radiation. She initially favored surgery but wanted to talk to radiation oncology before making a final decision. She saw Dr. Tammi Klippel in [place]. After discussion with Dr. Tammi Klippel she is interested in pursuing radiation therapy.  Past Medical History  Diagnosis Date  . Tobacco abuse   . Arthritis   . Psoriasis     Ankles  . Constipation     corrects with diet  . Floater, vitreous     Right  . Atrial fib/flutter, transient     post-op  . Ascending aortic aneurysm 07/20/2014    4.1 cm by CT  . Nonsquamous nonsmall cell neoplasm of lung 09/2011    Stage IB- s/p right upper lobectomy   Past Surgical History  Procedure Laterality  Date  . Right vats (upper lobectomy)  08/29/11    Dr Roxan Hockey  . Colonoscopy  01/22/2012    Procedure: COLONOSCOPY;  Surgeon: Danie Binder, MD;  Location: [facility];  Service: Endoscopy;  Laterality: N/A;  10:45   Family History  Problem Relation Age of Onset  . Hypertension Mother   . Heart disease Mother   . Heart disease Sister   . Heart disease Brother    Social History   Social History  . Marital Status: Widowed    Spouse Name: N/A  . Number of Children: 3  . Years of Education: N/A   Occupational History  . retired    Social History Main Topics  . Smoking status: Former Smoker -- 0.30 packs/day for 20 years    Types: Cigarettes    Quit date: 01/12/2014  . Smokeless tobacco: Never Used  . Alcohol Use: No  . Drug Use: No  . Sexual Activity: Not on file   Other Topics Concern  . Not on file   Social History Narrative   Lives w/ cousin     Current Outpatient Prescriptions  Medication Sig Dispense Refill  . alendronate (FOSAMAX) 70 MG tablet Take 70 mg by mouth once a week.   3  . aspirin 81 MG tablet Take 81 mg by mouth daily.     . calcium-vitamin D 250-100  MG-UNIT per tablet Take 1 tablet by mouth daily.     . Cyanocobalamin (VITAMIN B 12 PO) Take by mouth daily.    Marland Kitchen glucosamine-chondroitin 500-400 MG tablet Take 1 tablet by mouth daily.    . meclizine (ANTIVERT) 12.5 MG tablet Take 12.5 mg by mouth 2 (two) times daily as needed for dizziness.    . metoprolol tartrate (LOPRESSOR) 25 MG tablet Take 25 mg by mouth 2 (two) times daily.     . Multiple Vitamins-Iron (MULTIVITAMINS WITH IRON) TABS Take 1 tablet by mouth daily.    . Omega-3 Fatty Acids (FISH OIL) 500 MG CAPS Take by mouth daily.     No current facility-administered medications for this visit.    Physical Exam BP 147/85 mmHg  Pulse 51  Resp 16  Ht '5\' 10"'$  (1.778 m)  Wt 175 lb (79.379 kg)  BMI 25.11 kg/m2  SpO2 97% Elderly woman in no acute distress Well-developed  well-nourished Alert and oriented 3 with no focal deficits Cardiac regular rate and rhythm no murmur Lungs clear with equal breath sounds bilaterally Abdomen soft nontender No clubbing, cyanosis or edema  Diagnostic Tests: CT CHEST WITHOUT CONTRAST  TECHNIQUE: Multidetector CT imaging of the chest was performed following the standard protocol without IV contrast.  COMPARISON: 07/20/2014  FINDINGS: Mediastinum/Nodes: No axillary supraclavicular adenopathy. No mediastinal or hilar adenopathy. No pericardial fluid. Coronary calcifications noted.  Lungs/Pleura: Again demonstrated multiple bilateral rounded sub solid and ground-glass nodules .  The lesion of concern on previous exam in the left upper lobe continues to measure slightly larger at 15 x 9 mm (image 11, series 4) compared to an 13 x 7 mm on CT of 07/20/2014 and 10 mm x 7 mm on CT of 02/02/2014.  Example lesion in the right lung measures 12 mm on image 18, series 4 compared to 11 mm on prior.  Multiple additional bilateral lesions are not changed.  Postsurgical change in the right upper lobe is unchanged.  Upper abdomen: No focal hepatic lesion on limited view of the liver on noncontrast exam. Adrenal glands are normal. Small gallstones noted.  Musculoskeletal: No aggressive osseous lesion  IMPRESSION: 1. Ground-glass nodule in the left upper lobe continues to measure several mm larger from comparison exams. Recommend continued CT surveillance. 2. Additional bilateral ground-glass and sub solid nodules are unchanged. 3. Stable postsurgical change in the right upper lobe.   Electronically Signed  By: Suzy Bouchard M.D.  On: 02/01/2015 12:09  I personally reviewed the CT scan again today and concur with the above findings.  Impression: 74 year old woman with a history of a stage IB non-small cell carcinoma who has multiple groundglass opacities. These are stable for the most part.  However, there is a groundglass opacity in the left upper lobe that has gradually increased in size over time. This is highly suspicious for adenocarcinoma in situ. There is no definite invasion based on CT findings. This also could be an inflammatory process.  Once again discussed the options of continued radiographic follow-up, surgical resection, and radiation therapy. I'm reluctant to go with surgical resection due to the multifocal nature of the process and her previous surgery on the right side. She favors radiation therapy. Dr. Tammi Klippel wishes to have a biopsy prior to treatment.  I recommended that we do electromagnetic navigational bronchoscopy for biopsy and fiducial placement. I described the general nature of the procedure, the need for general anesthesia, the expectation that she will go home the same day. We discussed the  indications, risks, benefits, and alternatives. She understands that the risks include, but are not limited to those associated with general anesthesia, which in her instances could result in MI, stroke, or death. She understands the procedure specific risk include bleeding, pneumothorax, malposition of fiducials, and failure to make a diagnosis (20%). She accepts the risks and wishes to proceed with surgery.  Plan:  Electromagnetic navigational bronchoscopy with biopsy and fiducial placement on Friday, September 9.  I spent 15 minutes with Heather [name] during this visit, greater than 50% was spent counseling  [name], MD [facility] [phone]

## 2015-03-19 NOTE — Op Note (Signed)
Heather [name], LIMB               ACCOUNT NO.:  [id number]  MEDICAL RECORD NO.:  [id number]  LOCATION:  [facility]                         FACILITY:  [facility]  PHYSICIAN:  Revonda Standard. Roxan Hockey, M.D.DATE OF BIRTH:  03-09-1941  DATE OF PROCEDURE:  03/18/2015 DATE OF DISCHARGE:  03/18/2015                              OPERATIVE REPORT   PREOPERATIVE DIAGNOSIS:  Left upper lobe nodule.  POSTOPERATIVE DIAGNOSIS:  Left upper lobe nodule.  PROCEDURE:  Electromagnetic navigation bronchoscopy with brushings, biopsies, needle aspirations, and bronchoalveolar lavage.  SURGEON:  Revonda Standard. Roxan Hockey, MD  ASSISTANT:  None.  ANESTHESIA:  General.  FINDINGS:  Navigated to within 1 cm of the left upper lobe nodule with good alignment.  Multiple samples taken. Initial quick prep of brushings and needle aspirations- no tumor seen.  Vague suggestion of granulomatous inflammation noted.  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  She has been followed with serial CT scans. Over time, a nodule in the left upper lobe has slowly grown.  This nodule has a ground-glass appearance. Given the increase in size, consideration was given to surgical resection or radiation therapy due to the suspicion that this was an adenocarcinoma in situ.  The patient needed a biopsy to confirm diagnosis before deciding on treatment.  The indications, risks, benefits, and alternatives, as well as the limitations of navigational bronchoscopy were discussed in detail with the patient.  She understood and accepted the risks and agreed to proceed.  OPERATIVE NOTE:  [name] was brought to the operating room on March 18, 2015.  She had induction of general anesthesia and was intubated.  Flexible fiberoptic bronchoscopy was performed via the endotracheal tube.  The right upper lobe stump was well healed. There was otherwise normal  endobronchial anatomy and no endobronchial lesions. There were moderate thick clear secretions.  The locatable guide for navigation then was advanced through the bronchoscope and registration was performed.  There was good correlation of the video and virtual bronchoscopies.  The bronchoscope then was directed to the left upper lobe bronchus and the apical segmental bronchus was cannulated with the locatable guide, which was then advanced along the predetermined pathway to within 1 cm of the nodule initially.  The alignment was slightly tangential to the nodule, but with manipulation, there was good alignment with the nodule and the locatable guide still was within 1 cm.  Multiple samples then were obtained.  Multiple needle aspirations were performed.  A portion of these specimens was placed onto slides for immediate review and the remainder was placed into cytologic fluid.  Four aspirations were performed.  Next, brushings were performed with a needle brush.  This was repeated 4 times with the brushings placed on slides.  These specimens then were sent to Pathology for review.  The locatable guide was periodically reinserted during sampling to ensure that there was good proximity to the nodule and the sheath remained in a stable location.  All specimen collection was done with fluoroscopy.  Multiple biopsies then were taken.  The catheter then was repositioned which gave a distance of the lesion of  only 0.5 cm.  Additional needle aspirations, brushings, and biopsies were taken from this location as well.  Finally, a bronchoalveolar lavage was performed.  At this point, the results on the initial preparation showed no evidence of tumor.  There was "vague granulomatous inflammation" noted.  The BAL specimen was sent for AFB and fungal cultures.  A final inspection was made with the bronchoscope. There was no ongoing bleeding.  The patient then was extubated in the operating room and taken to  the postanesthetic care unit in good condition.     Revonda Standard Roxan Hockey, M.D.     SCH/MEDQ  D:  03/18/2015  T:  03/19/2015  Job:  [id number]

## 2015-03-21 ENCOUNTER — Encounter (HOSPITAL_COMMUNITY): Payer: Self-pay | Admitting: Thoracic Surgery (Cardiothoracic Vascular Surgery)

## 2015-03-21 LAB — CULTURE, RESPIRATORY
CULTURE: NO GROWTH
GRAM STAIN: NONE SEEN

## 2015-03-21 LAB — CULTURE, RESPIRATORY W GRAM STAIN

## 2015-03-24 ENCOUNTER — Ambulatory Visit
Admission: RE | Admit: 2015-03-24 | Discharge: 2015-03-24 | Disposition: A | Discharge status: Home / Self Care | Payer: Medicare Other | Source: Ambulatory Visit | Attending: Radiation Oncology | Admitting: Radiation Oncology

## 2015-03-24 ENCOUNTER — Ambulatory Visit (INDEPENDENT_AMBULATORY_CARE_PROVIDER_SITE_OTHER): Payer: Medicare Other | Admitting: Thoracic Surgery (Cardiothoracic Vascular Surgery)

## 2015-03-24 ENCOUNTER — Encounter: Payer: Self-pay | Admitting: Thoracic Surgery (Cardiothoracic Vascular Surgery)

## 2015-03-24 ENCOUNTER — Ambulatory Visit: Payer: Medicare Other

## 2015-03-24 VITALS — BP 130/82 | HR 72 | Resp 20 | Ht 66.5 in | Wt 180.0 lb

## 2015-03-24 DIAGNOSIS — Z85118 Personal history of other malignant neoplasm of bronchus and lung: Secondary | ICD-10-CM

## 2015-03-24 DIAGNOSIS — R911 Solitary pulmonary nodule: Secondary | ICD-10-CM

## 2015-03-24 DIAGNOSIS — C3491 Malignant neoplasm of unspecified part of right bronchus or lung: Secondary | ICD-10-CM

## 2015-03-24 DIAGNOSIS — Z9889 Other specified postprocedural states: Secondary | ICD-10-CM

## 2015-03-24 DIAGNOSIS — Z902 Acquired absence of lung [part of]: Secondary | ICD-10-CM

## 2015-03-24 NOTE — Progress Notes (Signed)
Patient ID: Heather [name], female   DOB: June 18, 1941, 74 y.o.   MRN: [id number]   [name] returns to discuss her biopsy results.  D/w Dr. Tammi Klippel.  PATH All biopsies were negative for tumor  I reviewed her CT images again - the nodule in question is a true GGO with no discernable solid component.  Our options are to proceed with SBRT without a diagnosis or to hold off on radiation and continue to follow radiographically. After discussion we have decided to continue to follow radiographically with a shorter interval. Her most recent scan was in July. I will schedule her for a repeat CT and follow up visit in November (4 months). If there is any additional growth, will try to get a CT guided needle biopsy.  I spent the entire visit in reviewing results and counseling her. I spent 5- 10 minutes face to face with her.  Revonda Standard Roxan Hockey, MD [facility] [phone]

## 2015-04-15 LAB — FUNGUS CULTURE W SMEAR: Fungal Smear: NONE SEEN

## 2015-04-25 DIAGNOSIS — H25813 Combined forms of age-related cataract, bilateral: Secondary | ICD-10-CM | POA: Diagnosis not present

## 2015-04-30 LAB — AFB CULTURE WITH SMEAR (NOT AT ARMC): Acid Fast Smear: NONE SEEN

## 2015-05-05 ENCOUNTER — Other Ambulatory Visit: Payer: Self-pay | Admitting: Thoracic Surgery (Cardiothoracic Vascular Surgery)

## 2015-05-05 DIAGNOSIS — C3491 Malignant neoplasm of unspecified part of right bronchus or lung: Secondary | ICD-10-CM

## 2015-05-18 DIAGNOSIS — Z85118 Personal history of other malignant neoplasm of bronchus and lung: Secondary | ICD-10-CM | POA: Diagnosis not present

## 2015-05-18 DIAGNOSIS — R918 Other nonspecific abnormal finding of lung field: Secondary | ICD-10-CM | POA: Diagnosis not present

## 2015-05-20 DIAGNOSIS — J449 Chronic obstructive pulmonary disease, unspecified: Secondary | ICD-10-CM | POA: Diagnosis not present

## 2015-05-20 DIAGNOSIS — M810 Age-related osteoporosis without current pathological fracture: Secondary | ICD-10-CM | POA: Diagnosis not present

## 2015-05-20 DIAGNOSIS — Z23 Encounter for immunization: Secondary | ICD-10-CM | POA: Diagnosis not present

## 2015-05-20 DIAGNOSIS — I10 Essential (primary) hypertension: Secondary | ICD-10-CM | POA: Diagnosis not present

## 2015-05-20 DIAGNOSIS — R42 Dizziness and giddiness: Secondary | ICD-10-CM | POA: Diagnosis not present

## 2015-05-20 DIAGNOSIS — R222 Localized swelling, mass and lump, trunk: Secondary | ICD-10-CM | POA: Diagnosis not present

## 2015-06-07 ENCOUNTER — Ambulatory Visit: Payer: Medicare Other | Admitting: Thoracic Surgery (Cardiothoracic Vascular Surgery)

## 2015-06-07 ENCOUNTER — Other Ambulatory Visit: Payer: Medicare Other

## 2015-06-21 ENCOUNTER — Ambulatory Visit: Payer: Medicare Other | Admitting: Thoracic Surgery (Cardiothoracic Vascular Surgery)

## 2015-06-21 ENCOUNTER — Encounter: Payer: Self-pay | Admitting: Thoracic Surgery (Cardiothoracic Vascular Surgery)

## 2015-06-21 ENCOUNTER — Ambulatory Visit
Admission: RE | Admit: 2015-06-21 | Discharge: 2015-06-21 | Disposition: A | Discharge status: Home / Self Care | Payer: Medicare Other | Source: Ambulatory Visit | Attending: Thoracic Surgery (Cardiothoracic Vascular Surgery) | Admitting: Thoracic Surgery (Cardiothoracic Vascular Surgery)

## 2015-06-21 ENCOUNTER — Ambulatory Visit (INDEPENDENT_AMBULATORY_CARE_PROVIDER_SITE_OTHER): Payer: Medicare Other | Admitting: Thoracic Surgery (Cardiothoracic Vascular Surgery)

## 2015-06-21 VITALS — BP 157/84 | HR 60 | Resp 20 | Ht 66.5 in | Wt 180.0 lb

## 2015-06-21 DIAGNOSIS — R918 Other nonspecific abnormal finding of lung field: Secondary | ICD-10-CM | POA: Diagnosis not present

## 2015-06-21 DIAGNOSIS — C3491 Malignant neoplasm of unspecified part of right bronchus or lung: Secondary | ICD-10-CM

## 2015-06-21 NOTE — Progress Notes (Addendum)
[address]       [place] [address]             [phone]       HPI: Ms. Nevils returns for further discussion of her multiple lung nodules/groundglass opacities.  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 She has been followed with CT scans since that time. She has had multiple groundglass opacities bilaterally. Over time there has been a slow interval increase in size of a nodule in the left upper lobe. There is no suspicious adenopathy. She also has a 4.1 cm ascending aortic aneurysm.  She had a relatively light smoking history. She quit altogether in 2015. She has not had any issues with her breathing. She denies wheezing. She does have an occasional cough. She denies hemoptysis. She is not having any chest pain or shortness of breath. Her weight has been stable although it fluctuates a bit. She's not had any unusual headaches or visual changes. She does not have any new bone or joint pain.  When I saw her in July her CT scan showed the left upper lobe nodule was once again slightly larger. There still was no significant soft tissue component. We discussed continued radiographic follow-up versus surgical resection versus radiation. She initially favored surgery but wanted to talk to radiation oncology before making a final decision. She saw Dr. Tammi Klippel in [place]. After discussion with Dr. Tammi Klippel she was interested in pursuing radiation therapy. He wished to have a biopsy prior to starting treatment. I did a navigational bronchoscopy on 03/18/2015. It was nondiagnostic.  Following the results of that biopsy she decided she would rather just be followed radiographically rather than have radiation. She does understand that the negative biopsy does not rule out the possibility that this is cancer. I advised her to come back with a shorter interval follow-up  CT.   Past Medical History  Diagnosis Date  . Tobacco abuse   . Arthritis   . Psoriasis     Ankles  . Constipation     corrects with diet  . Floater, vitreous     Right  . Atrial fib/flutter, transient     post-op  . Ascending aortic aneurysm ([facility]) 07/20/2014    4.1 cm by CT  . Nonsquamous nonsmall cell neoplasm of lung ([facility]) 09/2011    Stage IB- s/p right upper lobectomy  . Hypertension   . Pneumonia      Current Outpatient Prescriptions  Medication Sig Dispense Refill  . alendronate (FOSAMAX) 70 MG tablet Take 70 mg by mouth once a week.   3  . aspirin 81 MG tablet Take 81 mg by mouth daily.     . calcium-vitamin D (OSCAL WITH D) 500-200 MG-UNIT tablet Take 1 tablet by mouth 3 (three) times daily.    . calcium-vitamin D 250-100 MG-UNIT per tablet Take 1 tablet by mouth daily.     Marland Kitchen glucosamine-chondroitin 500-400 MG tablet Take 1 tablet by mouth daily.    . meclizine (ANTIVERT) 12.5 MG tablet Take 12.5 mg by mouth 2 (two) times daily as needed for dizziness.    . metoprolol tartrate (LOPRESSOR) 25 MG tablet Take 25 mg by mouth 2 (two) times daily.     . Multiple Vitamins-Iron (MULTIVITAMINS WITH IRON) TABS Take 1 tablet by mouth daily.    . Omega-3 Fatty Acids (FISH OIL) 500 MG  CAPS Take by mouth daily.    . vitamin B-12 (CYANOCOBALAMIN) 500 MCG tablet Take 500 mcg by mouth daily.     No current facility-administered medications for this visit.    Physical Exam BP 157/84 mmHg  Pulse 60  Resp 20  Ht 5' 6.5" (1.689 m)  Wt 180 lb (81.647 kg)  BMI 28.62 kg/m2  SpO16 56% 74 year old woman in no acute distress Well-developed well-nourished Alert and oriented 3 with no focal neurologic deficit No cervical or suprapubic or adenopathy Cardiac regular rate and rhythm normal S1 and S2 Lungs mildly diminished right base, otherwise clear  Diagnostic Tests: CT CHEST WITHOUT CONTRAST  TECHNIQUE: Multidetector CT imaging of the chest was performed following the standard  protocol without IV contrast.  COMPARISON: CT 02/01/2015  FINDINGS: Mediastinum/Nodes: No axillary supraclavicular adenopathy. No mediastinal hilar adenopathy. No pericardial fluid. Esophagus normal.  Lungs/Pleura: Postsurgical change in the RIGHT upper lobe with linear bands of parenchymal thickening at the surgical site. No new nodularity.  There are multiple the ground-glass nodules within both lungs not changed. For example 9 mm nodule in the RIGHT lower lobe posterior to the surgical site (image 14, series 4) is unchanged from 9 mm. More central nodule in the RIGHT lower lobe adjacent to the azygos vein measures 9 mm compared to 11 mm (image 19, series 4).  In the LEFT lung, there is a thin-walled cavitary lesion measuring 17 mm on image 24 not changed in size. A more thick-walled cavitary lesion in the RIGHT lower lobe seen on coronal image 86, series 601 measuring 6 mm and unchanged.  LEFT upper lobe ground-glass nodule measures 9 mm on image 07/28/2017 not changed. More superior ground-glass nodule measures 14 mm on image 10 unchanged.  Upper abdomen: Gallstones noted. Adrenal glands normal.  Musculoskeletal: No aggressive osseous lesion. Bulky osteophytes.  IMPRESSION: 1. Stable postsurgical change in the RIGHT upper lobe. No evidence local recurrence. 2. Bilateral ground-glass pulmonary nodules not changed in size compared to prior. 3. Two cavitary nodules in the LEFT lung are also stable.   Electronically Signed  By: Suzy Bouchard M.D.  On: 06/21/2015 10:43        I personally reviewed the CT chest and concur with the findings as noted above  Impression: Mrs. Slaugh is a 74 year old woman with a history of a stage IB lung cancer treated with a right upper lobectomy who has multiple groundglass nodules bilaterally. We have been following this for some time. Back in September we did a navigational bronchoscopy and sampled the lesions,  but the specimens were nondiagnostic.  Her CT today shows no significant change in the groundglass opacities. I once again reviewed our options which include continued radiographic follow-up, additional attempts to biopsy, or radiation therapy in the absence of a definitive diagnosis. She understands the pros and cons of each of those approaches. She does not wish to have any further intervention at this time. She understands the consequences of that incision.  Plan: Return in 4 months with CT chest to follow-up multiple groundglass lung nodules  [name], MD [facility] [phone]  Addendum  Her blood pressure was elevated at 157/84. She attributes this to stress from worrying about the results of the CT scan. I recommended that she follow-up with Dr. Legrand Rams regarding that issue.  Revonda Standard Roxan Hockey, MD [facility] [phone]

## 2015-09-29 ENCOUNTER — Other Ambulatory Visit: Payer: Self-pay | Admitting: Thoracic Surgery (Cardiothoracic Vascular Surgery)

## 2015-09-29 DIAGNOSIS — C3491 Malignant neoplasm of unspecified part of right bronchus or lung: Secondary | ICD-10-CM

## 2015-10-05 ENCOUNTER — Other Ambulatory Visit: Payer: Self-pay | Admitting: Thoracic Surgery (Cardiothoracic Vascular Surgery)

## 2015-10-05 DIAGNOSIS — C3491 Malignant neoplasm of unspecified part of right bronchus or lung: Secondary | ICD-10-CM

## 2015-11-01 ENCOUNTER — Ambulatory Visit: Payer: Medicare Other | Admitting: Thoracic Surgery (Cardiothoracic Vascular Surgery)

## 2015-11-01 ENCOUNTER — Ambulatory Visit
Admission: RE | Admit: 2015-11-01 | Discharge: 2015-11-01 | Disposition: A | Discharge status: Home / Self Care | Payer: Medicare Other | Source: Ambulatory Visit | Attending: Thoracic Surgery (Cardiothoracic Vascular Surgery) | Admitting: Thoracic Surgery (Cardiothoracic Vascular Surgery)

## 2015-11-01 DIAGNOSIS — C3491 Malignant neoplasm of unspecified part of right bronchus or lung: Secondary | ICD-10-CM

## 2015-11-15 ENCOUNTER — Ambulatory Visit (INDEPENDENT_AMBULATORY_CARE_PROVIDER_SITE_OTHER): Payer: Medicare Other | Admitting: Thoracic Surgery (Cardiothoracic Vascular Surgery)

## 2015-11-15 ENCOUNTER — Encounter: Payer: Self-pay | Admitting: Thoracic Surgery (Cardiothoracic Vascular Surgery)

## 2015-11-15 VITALS — BP 147/93 | HR 77 | Resp 16 | Ht 66.5 in | Wt 175.0 lb

## 2015-11-15 DIAGNOSIS — Z902 Acquired absence of lung [part of]: Secondary | ICD-10-CM | POA: Diagnosis not present

## 2015-11-15 DIAGNOSIS — R918 Other nonspecific abnormal finding of lung field: Secondary | ICD-10-CM

## 2015-11-15 DIAGNOSIS — C3491 Malignant neoplasm of unspecified part of right bronchus or lung: Secondary | ICD-10-CM

## 2015-11-15 DIAGNOSIS — I712 Thoracic aortic aneurysm, without rupture, unspecified: Secondary | ICD-10-CM

## 2015-11-15 NOTE — Progress Notes (Signed)
[address]       [place] [address]             [phone]       HPI: Heather [name] returns today for scheduled follow-up visit.  She is a 75 year old woman who had a right upper lobectomy in March 2013 for a stage IB non-small cell carcinoma (moderately differentiated adenocarcinoma). She has been followed with CT scans since that time. She has had multiple groundglass opacities bilaterally. Over time there has been a slow interval increase in size of a nodule in the left upper lobe. There has been no suspicious adenopathy. She also has a 4.1 cm ascending aortic aneurysm.  In July 2016 her CT scan showed the left upper lobe nodule was once again slightly larger. There was no significant soft tissue component. We discussed continued radiographic follow-up versus surgical resection versus radiation. She initially favored surgery but wanted to talk to radiation oncology before making a final decision. She saw Dr. Tammi Klippel in [place]. After discussion with Dr. Tammi Klippel she was interested in pursuing radiation therapy. He wished to have a biopsy prior to starting treatment. I did a navigational bronchoscopy on 03/18/2015. It was nondiagnostic. She decided she would rather be followed radiographically than have radiation.   She does understand that the negative biopsy does not rule out the possibility that this is cancer. I advised her to come back with a shorter interval follow-up CT. she now returns for 4 month follow-up.  She had a relatively light smoking history and quit altogether in 2015. She has not had any issues with her breathing. She denies wheezing. She does have an occasional cough. She denies hemoptysis. She is not having any chest pain or shortness of breath. Her weight has been stable although it fluctuates a bit. She's not had any unusual headaches or visual changes. She does not have any new bone or joint pain. She is upset about something that happened before she  came to the office today, but does not want to talk about it. I suspect she has a lot of anxiety about the CT results.  Past Medical History  Diagnosis Date  . Tobacco abuse   . Arthritis   . Psoriasis     Ankles  . Constipation     corrects with diet  . Floater, vitreous     Right  . Atrial fib/flutter, transient     post-op  . Ascending aortic aneurysm ([facility]) 07/20/2014    4.1 cm by CT  . Nonsquamous nonsmall cell neoplasm of lung ([facility]) 09/2011    Stage IB- s/p right upper lobectomy  . Hypertension   . Pneumonia      Current Outpatient Prescriptions  Medication Sig Dispense Refill  . alendronate (FOSAMAX) 70 MG tablet Take 70 mg by mouth once a week.   3  . aspirin 81 MG tablet Take 81 mg by mouth daily.     . calcium-vitamin D (OSCAL WITH D) 500-200 MG-UNIT tablet Take 1 tablet by mouth 3 (three) times daily.    . calcium-vitamin D 250-100 MG-UNIT per tablet Take 1 tablet by mouth daily.     Marland Kitchen glucosamine-chondroitin 500-400 MG tablet Take 1 tablet by mouth daily.    . meclizine (ANTIVERT) 12.5 MG tablet Take 12.5 mg by mouth 2 (two) times daily as needed for dizziness.    . metoprolol tartrate (LOPRESSOR) 25 MG tablet Take 25 mg by mouth 2 (two) times daily.     Marland Kitchen  Multiple Vitamins-Iron (MULTIVITAMINS WITH IRON) TABS Take 1 tablet by mouth daily.    . Omega-3 Fatty Acids (FISH OIL) 500 MG CAPS Take by mouth daily.    . vitamin B-12 (CYANOCOBALAMIN) 500 MCG tablet Take 500 mcg by mouth daily.     No current facility-administered medications for this visit.    Physical Exam BP 147/93 mmHg  Pulse 77  Resp 16  Ht 5' 6.5" (1.689 m)  Wt 175 lb (79.379 kg)  BMI 27.83 kg/m2  SpO79 55% 75 year old woman in no acute distress Alert and oriented 3 with no focal deficits No cervical or supra clavicular adenopathy Cardiac regular rate and rhythm normal S1 and S2 Lungs diminished at right base otherwise clear no wheezing  Diagnostic Tests: CT CHEST WITHOUT  CONTRAST  TECHNIQUE: Multidetector CT imaging of the chest was performed following the standard protocol without IV contrast.  COMPARISON: 06/21/2015  FINDINGS: Mediastinum: There is moderate cardiac enlargement. Aortic atherosclerosis is noted. Calcification involving the LAD coronary artery noted. The trachea appears patent and is midline. Normal appearance of the esophagus.  Lungs/Pleura: There is no pleural effusion. Multiple ground-glass lesions are again identified throughout both lungs. Index nodule within the left upper lobe measures 1.6 cm, image 20 of series 4. Previously this measured the same. The index nodule within the anterior left upper lobe measures 1 cm, image 41 of series 4. Previously 1.1 cm. The cystic lesion within the left upper lobe is stable measuring 1.9 cm, image 50 of series 4. Left lower lobe partially cavitary nodule measures 1.3 cm and is unchanged, image 62 of series 4. Within the anterior medial aspect of the right lower lobe there is a 1.1 cm sub solid lesion, image 34 series 4. Previously 1 cm. 8 mm ground-glass nodule within the anterior right lower lobe is unchanged, image 25 of series 4. Stable postsurgical changes within the right upper lobe with linear bands of parenchymal thickening at the surgical site. No new nodularity identified.  Upper Abdomen: The adrenal glands are unremarkable. There are multiple stones identified within the dependent portion of the gallbladder. Aortic atherosclerosis noted.  Musculoskeletal: There is multi level thoracic spondylosis identified. There is mild degenerative disc disease within the thoracic spine. No aggressive lytic or sclerotic bone lesions identified.  IMPRESSION: 1. Stable postsurgical change in the right upper lobe without evidence for local recurrence. 2. Multiple sub solid lesions are again noted throughout both lungs and are not significantly changed in size compared with  previous exam. 3. Cavitary nodules within the left lung are stable. 4. Aortic atherosclerosis and coronary artery calcification.   Electronically Signed  By: Kerby Moors M.D.  On: 11/01/2015 14:20 I personally reviewed the CT chest and concur with the findings as noted above.  Impression: 75 year old woman with history of a stage I B non-small cell carcinoma of the right upper lobe, who has multiple bilateral groundglass opacities the largest which is in the left upper lobe. Biopsies of those groundglass opacities were nondiagnostic. She understands that does not rule out the possibility of a low-grade adenocarcinoma. After extensive discussion previously she opted to continue with radiographic follow-up and not pursue SBRT.  Her interval CT scan today shows no significant change in any of the sub-solid lesions. We again discussed her options and she wishes to continue with radiographic follow-up.  She does have known aortic atherosclerosis and coronary artery calcification and a 4.1 cm ascending aneurysm. We will continue to follow the aneurysm was CT.  Hypertension- her blood  pressure remains elevated. She has an appointment with her primary later this month and can address that at that time.  Plan:  Return in 6 months with CT chest.  I spent 15 minutes face-to-face with Heather [name] during this visit, greater than 50% of time spent in counseling Heather Nakayama, MD [facility] [phone]

## 2016-05-02 ENCOUNTER — Other Ambulatory Visit: Payer: Self-pay | Admitting: Thoracic Surgery (Cardiothoracic Vascular Surgery)

## 2016-05-02 DIAGNOSIS — R911 Solitary pulmonary nodule: Secondary | ICD-10-CM

## 2016-05-29 ENCOUNTER — Encounter: Payer: Self-pay | Admitting: Thoracic Surgery (Cardiothoracic Vascular Surgery)

## 2016-05-29 ENCOUNTER — Ambulatory Visit (INDEPENDENT_AMBULATORY_CARE_PROVIDER_SITE_OTHER): Payer: Medicare Other | Admitting: Thoracic Surgery (Cardiothoracic Vascular Surgery)

## 2016-05-29 ENCOUNTER — Ambulatory Visit
Admission: RE | Admit: 2016-05-29 | Discharge: 2016-05-29 | Disposition: A | Discharge status: Home / Self Care | Payer: Medicare Other | Source: Ambulatory Visit | Attending: Thoracic Surgery (Cardiothoracic Vascular Surgery) | Admitting: Thoracic Surgery (Cardiothoracic Vascular Surgery)

## 2016-05-29 VITALS — BP 135/82 | HR 61 | Resp 16 | Ht 65.5 in | Wt 176.8 lb

## 2016-05-29 DIAGNOSIS — Z902 Acquired absence of lung [part of]: Secondary | ICD-10-CM | POA: Diagnosis not present

## 2016-05-29 DIAGNOSIS — I712 Thoracic aortic aneurysm, without rupture, unspecified: Secondary | ICD-10-CM

## 2016-05-29 DIAGNOSIS — C3491 Malignant neoplasm of unspecified part of right bronchus or lung: Secondary | ICD-10-CM

## 2016-05-29 DIAGNOSIS — R911 Solitary pulmonary nodule: Secondary | ICD-10-CM

## 2016-05-29 NOTE — Progress Notes (Signed)
[address]       [place] [address]             [phone]    HPI: Ms. Lover returns for a scheduled follow-up visit  She is a 75 year old woman who had a right upper lobectomy for a stage IB non-small cell carcinoma in March 2013. That was a moderately differentiated adenocarcinoma. She has been followed with CT since that time. She has had multiple groundglass opacities bilaterally. She also has a 4.1 cm ascending aneurysm.  In July 2016 the left upper lobe nodule appeared slightly larger. I did a navigational bronchoscopy that was nondiagnostic. She elected not to have stereotactic radiation.  I last saw her in May 2017. She was doing well at that time. The nodules were stable.  She has been feeling well. She's not had any problems with chest pain or shortness of breath. She denies hemoptysis. She does have an occasional cough. She denies any wheezing. Her weight has been stable and her appetite is good. She's not had any unusual bone or joint pain or any new headaches or visual changes. Past Medical History:  Diagnosis Date  . Arthritis   . Ascending aortic aneurysm ([facility]) 07/20/2014   4.1 cm by CT  . Atrial fib/flutter, transient    post-op  . Constipation    corrects with diet  . Floater, vitreous    Right  . Hypertension   . Nonsquamous nonsmall cell neoplasm of lung ([facility]) 09/2011   Stage IB- s/p right upper lobectomy  . Pneumonia   . Psoriasis    Ankles  . Tobacco abuse     Current Outpatient Prescriptions  Medication Sig Dispense Refill  . alendronate (FOSAMAX) 70 MG tablet Take 70 mg by mouth once a week.   3  . aspirin 81 MG tablet Take 81 mg by mouth daily.     . calcium-vitamin D (OSCAL WITH D) 500-200 MG-UNIT tablet Take 1 tablet by mouth 3 (three) times daily.    Marland Kitchen glucosamine-chondroitin 500-400 MG tablet Take 1 tablet by mouth daily.    . meclizine (ANTIVERT) 12.5 MG tablet Take 12.5 mg by mouth 2 (two) times daily as needed for  dizziness.    . metoprolol tartrate (LOPRESSOR) 25 MG tablet Take 25 mg by mouth 2 (two) times daily.     . Multiple Vitamins-Iron (MULTIVITAMINS WITH IRON) TABS Take 1 tablet by mouth daily.    . Omega-3 Fatty Acids (FISH OIL) 500 MG CAPS Take by mouth daily.    . vitamin B-12 (CYANOCOBALAMIN) 500 MCG tablet Take 500 mcg by mouth daily.     No current facility-administered medications for this visit.     Physical Exam BP 135/82 (BP Location: Right Arm, Patient Position: Sitting, Cuff Size: Large)   Pulse 61   Resp 16   Ht 5' 5.5" (1.664 m)   Wt 176 lb 12.8 oz (80.2 kg)   SpO2 98% Comment: ON RA  BMI 28.39 kg/m  75 year old woman in no acute distress Alert and oriented 3 with no focal deficits No cervical or supraclavicular adenopathy Cardiac regular rate and rhythm normal S1 and S2 Lungs clear bilaterally  Diagnostic Tests: CT CHEST WITHOUT CONTRAST  TECHNIQUE: Multidetector CT imaging of the chest was performed following the standard protocol without IV contrast.  COMPARISON:  11/01/2015.  FINDINGS: Cardiovascular: Coronary artery calcification and aortic atherosclerotic calcification.  Mediastinum/Nodes: No axillary supraclavicular adenopathy. No mediastinal hilar adenopathy.  Lungs/Pleura: Ground-glass nodules  in the LEFT and RIGHT upper lobe are again demonstrated. 17 mm ground-glass nodule in the LEFT upper lobe (image 21, series 4) compared to 16 mm.  Additional smaller ground-glass nodules in the LEFT upper lobe are unchanged. Ground-glass nodule in the RIGHT upper lobe measures 8 mm on image 24, series 4 also unchanged. Postsurgical changes in the low RIGHT upper lobe.  Upper Abdomen: Limited view of the liver, kidneys, pancreas are unremarkable. Normal adrenal glands. Cholelithiasis  Musculoskeletal: No aggressive osseous lesion  IMPRESSION: 1. Stable bilateral ground-glass nodules. Largest nodule LEFT upper lobe measures 17 mm. Recommend  follow-up CT in 24 months per Fleischner criteria   Electronically Signed   By: Suzy Bouchard M.D.   On: 05/29/2016 14:26 I personally reviewed the CT chest and concur with the findings noted above. In addition, her 4.1 cm ascending aneurysm is unchanged.  Impression: Mrs. Ratledge is a 75 year old woman who had a thoracoscopic right upper lobectomy for a stage IB non-small cell carcinoma back in March 2013. She has no evidence of recurrent disease.  Multiple groundglass nodules- she has had these for several years now. Initially one in the left upper lobe was increasing in size, but that has now been stable over the past year. These do need continued follow-up. I recommended that she return for a CT in one year.  4.1 cm ascending aneurysm- unchanged. Blood pressure is well controlled. Will need a CT in one year.  Plan: Return in one year with CT chest to follow-up groundglass opacities and ascending aneurysm    [name], MD [facility] [phone]

## 2016-09-14 DIAGNOSIS — J189 Pneumonia, unspecified organism: Secondary | ICD-10-CM | POA: Diagnosis not present

## 2016-09-14 DIAGNOSIS — C3490 Malignant neoplasm of unspecified part of unspecified bronchus or lung: Secondary | ICD-10-CM | POA: Diagnosis not present

## 2016-09-14 DIAGNOSIS — I10 Essential (primary) hypertension: Secondary | ICD-10-CM | POA: Diagnosis not present

## 2016-09-14 DIAGNOSIS — M1990 Unspecified osteoarthritis, unspecified site: Secondary | ICD-10-CM | POA: Diagnosis not present

## 2016-09-14 DIAGNOSIS — M810 Age-related osteoporosis without current pathological fracture: Secondary | ICD-10-CM | POA: Diagnosis not present

## 2016-09-14 DIAGNOSIS — J449 Chronic obstructive pulmonary disease, unspecified: Secondary | ICD-10-CM | POA: Diagnosis not present

## 2016-09-27 DIAGNOSIS — H43813 Vitreous degeneration, bilateral: Secondary | ICD-10-CM | POA: Diagnosis not present

## 2016-09-27 DIAGNOSIS — Z961 Presence of intraocular lens: Secondary | ICD-10-CM | POA: Diagnosis not present

## 2016-09-27 DIAGNOSIS — H26493 Other secondary cataract, bilateral: Secondary | ICD-10-CM | POA: Diagnosis not present

## 2016-09-27 DIAGNOSIS — H524 Presbyopia: Secondary | ICD-10-CM | POA: Diagnosis not present

## 2016-12-11 ENCOUNTER — Encounter: Payer: Self-pay | Admitting: Gastroenterology

## 2017-01-18 DIAGNOSIS — M1990 Unspecified osteoarthritis, unspecified site: Secondary | ICD-10-CM | POA: Diagnosis not present

## 2017-01-18 DIAGNOSIS — M810 Age-related osteoporosis without current pathological fracture: Secondary | ICD-10-CM | POA: Diagnosis not present

## 2017-01-18 DIAGNOSIS — R42 Dizziness and giddiness: Secondary | ICD-10-CM | POA: Diagnosis not present

## 2017-01-18 DIAGNOSIS — I10 Essential (primary) hypertension: Secondary | ICD-10-CM | POA: Diagnosis not present

## 2017-02-06 ENCOUNTER — Ambulatory Visit (INDEPENDENT_AMBULATORY_CARE_PROVIDER_SITE_OTHER): Payer: Medicare Other | Admitting: Nurse Practitioner

## 2017-02-06 ENCOUNTER — Encounter: Payer: Self-pay | Admitting: Nurse Practitioner

## 2017-02-06 ENCOUNTER — Other Ambulatory Visit: Payer: Self-pay

## 2017-02-06 VITALS — BP 142/79 | HR 68 | Temp 97.2°F | Ht 68.0 in | Wt 169.8 lb

## 2017-02-06 DIAGNOSIS — K5900 Constipation, unspecified: Secondary | ICD-10-CM

## 2017-02-06 DIAGNOSIS — Z86010 Personal history of colonic polyps: Secondary | ICD-10-CM | POA: Diagnosis not present

## 2017-02-06 MED ORDER — PEG 3350-KCL-NA BICARB-NACL 420 G PO SOLR: 4000.0000 mL | ORAL | 0 refills | Status: DC

## 2017-02-06 NOTE — Progress Notes (Signed)
cc'ed to pcp °

## 2017-02-06 NOTE — Patient Instructions (Signed)
1. We will schedule your procedure for you. 2. Further recommendations will be made after your procedure. 3. Follow-up based on those recommendations made after your colonoscopy. 4. Call us if you have any questions or concerns.

## 2017-02-06 NOTE — Assessment & Plan Note (Signed)
History of multiple polyps on her last colonoscopy 5 years ago which are found to be simple adenomas. Recommended 5 year repeat exam and she is currently due. She is generally asymptomatic from a GI standpoint. We will proceed with colonoscopy as previously recommended.  Proceed with colonoscopy with Dr. Oneida Alar in the near future. The risks, benefits, and alternatives have been discussed in detail with the patient. They state understanding and desire to proceed.   Per previous notes we'll plan for the patient to have a 1 hour time slot and notes for an overtube.  The patient is not on any anticoagulants, anxiolytics, chronic pain medications, or antidepressants. Denies alcohol and drug use. Conscious sedation should be adequate for her procedure as it was for her last.

## 2017-02-06 NOTE — Patient Instructions (Signed)
PA info for Colonoscopy submitted via [facility] website. No PA needed. Decision ID# [id number].

## 2017-02-06 NOTE — Assessment & Plan Note (Signed)
Mild intermittent constipation which the patient states she has because "I like cheese." This is generally resolved after drinking coffee. She states she is satisfied with the results and has not wanting any new medications at this point. Continue to monitor. Return for follow-up as needed.

## 2017-02-06 NOTE — Progress Notes (Signed)
Primary Care Physician:  Rosita Fire, MD Primary Gastroenterologist:  Dr. Oneida Alar  Chief Complaint  Patient presents with  . Colonoscopy    last tcs 01/2012  . Constipation    HPI:   Heather [name] is a 76 y.o. female who presents for 5 year recall colonoscopy. The patient's last colonoscopy was completed 01/22/2012 which found multiple colon polyps, mild pancolonic diverticulosis, internal hemorrhoids. Polyps were found to be simple adenoma and recommended 5 year repeat colonoscopy if benefits outweigh the risks with a 1 hour slot and overtube. The patient also has a history of constipation. She is not currently on any pain medications or laxatives, per her medication list.  Today she states she's doing well. She has constipation intermittently because "I like cheese" and drinking coffee usually manages her constipation well enough for her. Denies abdominal pain, N/V, hematochezia, melena, unintentional weight loss, fever, chills, acute/sudden changes in bowel habits. Denies chest pain, dyspnea, dizziness, lightheadedness, syncope, near syncope. Denies any other upper or lower GI symptoms.  Past Medical History:  Diagnosis Date  . Arthritis   . Ascending aortic aneurysm ([place]) 07/20/2014   4.1 cm by CT  . Atrial fib/flutter, transient    post-op  . Constipation    corrects with diet  . Floater, vitreous    Right  . Hypertension   . 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 Pneumonia   . Psoriasis    Ankles  . Tobacco abuse     Past Surgical History:  Procedure Laterality Date  . CATARACT EXTRACTION Bilateral 2016  . COLONOSCOPY  01/22/2012   Procedure: COLONOSCOPY;  Surgeon: Danie Binder, MD;  [facility];  Service: Endoscopy;  Laterality: N/A;  10:45  . right VATS (upper lobectomy)  08/29/11   Dr Roxan Hockey  . VIDEO BRONCHOSCOPY WITH ENDOBRONCHIAL NAVIGATION N/A 03/18/2015   Procedure: VIDEO  BRONCHOSCOPY WITH ENDOBRONCHIAL NAVIGATION;  Surgeon: [name], MD;  Location: [facility];  Service: Thoracic;  Laterality: N/A;    Current Outpatient Prescriptions  Medication Sig Dispense Refill  . alendronate (FOSAMAX) 70 MG tablet Take 70 mg by mouth once a week.   3  . aspirin 81 MG tablet Take 81 mg by mouth daily.     . calcium-vitamin D (OSCAL WITH D) 500-200 MG-UNIT tablet Take 1 tablet by mouth 3 (three) times daily.    Marland Kitchen glucosamine-chondroitin 500-400 MG tablet Take 1 tablet by mouth daily.    . meclizine (ANTIVERT) 12.5 MG tablet Take 12.5 mg by mouth 2 (two) times daily as needed for dizziness.    . metoprolol tartrate (LOPRESSOR) 25 MG tablet Take 25 mg by mouth 2 (two) times daily.     . Multiple Vitamins-Iron (MULTIVITAMINS WITH IRON) TABS Take 1 tablet by mouth daily.    . Omega-3 Fatty Acids (FISH OIL) 500 MG CAPS Take by mouth daily.    . vitamin B-12 (CYANOCOBALAMIN) 500 MCG tablet Take 500 mcg by mouth daily.     No current facility-administered medications for this visit.     Allergies as of 02/06/2017 - Review Complete 02/06/2017  Allergen Reaction Noted  . Ibuprofen Nausea And Vomiting 01/07/2012    Family History  Problem Relation Age of Onset  . Hypertension Mother   . Heart disease Mother   . Heart disease Sister   . Heart disease Brother   . Colon cancer Neg Hx  Social History   Social History  . Marital status: Widowed    Spouse name: N/A  . Number of children: 3  . Years of education: N/A   Occupational History  . retired    Social History Main Topics  . Smoking status: Former Smoker    Packs/day: 0.30    Years: 20.00    Types: Cigarettes    Quit date: 01/12/2014  . Smokeless tobacco: Never Used  . Alcohol use No  . Drug use: No  . Sexual activity: Not on file   Other Topics Concern  . Not on file   Social History Narrative   Lives w/ cousin    Review of Systems: Complete ROS negative except as per  HPI.    Physical Exam: BP (!) 142/79   Pulse 68   Temp (!) 97.2 F (36.2 C) (Oral)   Ht 5\' 8"  (1.727 m)   Wt 169 lb 12.8 oz (77 kg)   BMI 25.82 kg/m  General:   Alert and oriented. Pleasant and cooperative. Well-nourished and well-developed.  Head:  Normocephalic and atraumatic. Eyes:  Without icterus, sclera clear and conjunctiva pink.  Ears:  Normal auditory acuity. Cardiovascular:  S1, S2 present without murmurs appreciated. Extremities without clubbing or edema. Respiratory:  Clear to auscultation bilaterally. No wheezes, rales, or rhonchi. No distress.  Gastrointestinal:  +BS, soft, non-tender and non-distended. No HSM noted. No guarding or rebound. No masses appreciated.  Rectal:  Deferred  Musculoskalatal:  Symmetrical without gross deformities. Neurologic:  Alert and oriented x4;  grossly normal neurologically. Psych:  Alert and cooperative. Normal mood and affect. Heme/Lymph/Immune: No excessive bruising noted.    02/06/2017 9:14 AM   Disclaimer: This note was dictated with voice recognition software. Similar sounding words can inadvertently be transcribed and may not be corrected upon review.

## 2017-02-10 NOTE — Progress Notes (Signed)
REVIEWED-NO ADDITIONAL RECOMMENDATIONS. 

## 2017-02-21 DIAGNOSIS — Z1231 Encounter for screening mammogram for malignant neoplasm of breast: Secondary | ICD-10-CM | POA: Diagnosis not present

## 2017-02-27 DIAGNOSIS — N6489 Other specified disorders of breast: Secondary | ICD-10-CM | POA: Diagnosis not present

## 2017-02-27 DIAGNOSIS — N6002 Solitary cyst of left breast: Secondary | ICD-10-CM | POA: Diagnosis not present

## 2017-02-27 DIAGNOSIS — R928 Other abnormal and inconclusive findings on diagnostic imaging of breast: Secondary | ICD-10-CM | POA: Diagnosis not present

## 2017-03-14 DIAGNOSIS — H6120 Impacted cerumen, unspecified ear: Secondary | ICD-10-CM | POA: Diagnosis not present

## 2017-03-14 DIAGNOSIS — R42 Dizziness and giddiness: Secondary | ICD-10-CM | POA: Diagnosis not present

## 2017-03-18 DIAGNOSIS — H43813 Vitreous degeneration, bilateral: Secondary | ICD-10-CM | POA: Diagnosis not present

## 2017-03-20 ENCOUNTER — Telehealth: Payer: Self-pay

## 2017-03-20 ENCOUNTER — Other Ambulatory Visit: Payer: Self-pay

## 2017-03-20 MED ORDER — PEG 3350-KCL-NA BICARB-NACL 420 G PO SOLR: 4000.0000 mL | ORAL | 0 refills | Status: DC
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
## 2017-03-29 DIAGNOSIS — Z79899 Other long term (current) drug therapy: Secondary | ICD-10-CM | POA: Diagnosis not present

## 2017-03-29 DIAGNOSIS — Z7982 Long term (current) use of aspirin: Secondary | ICD-10-CM | POA: Diagnosis not present

## 2017-03-29 DIAGNOSIS — W108XXA Fall (on) (from) other stairs and steps, initial encounter: Secondary | ICD-10-CM | POA: Diagnosis not present

## 2017-03-29 DIAGNOSIS — S83401A Sprain of unspecified collateral ligament of right knee, initial encounter: Secondary | ICD-10-CM | POA: Diagnosis not present

## 2017-03-29 DIAGNOSIS — S93401A Sprain of unspecified ligament of right ankle, initial encounter: Secondary | ICD-10-CM | POA: Diagnosis not present

## 2017-03-29 DIAGNOSIS — Z85118 Personal history of other malignant neoplasm of bronchus and lung: Secondary | ICD-10-CM | POA: Diagnosis not present

## 2017-03-29 DIAGNOSIS — S99911A Unspecified injury of right ankle, initial encounter: Secondary | ICD-10-CM | POA: Diagnosis not present

## 2017-03-29 DIAGNOSIS — Z87891 Personal history of nicotine dependence: Secondary | ICD-10-CM | POA: Diagnosis not present

## 2017-03-29 DIAGNOSIS — M7989 Other specified soft tissue disorders: Secondary | ICD-10-CM | POA: Diagnosis not present

## 2017-03-29 DIAGNOSIS — S93491A Sprain of other ligament of right ankle, initial encounter: Secondary | ICD-10-CM | POA: Diagnosis not present

## 2017-03-29 DIAGNOSIS — I10 Essential (primary) hypertension: Secondary | ICD-10-CM | POA: Diagnosis not present

## 2017-03-29 DIAGNOSIS — S8391XA Sprain of unspecified site of right knee, initial encounter: Secondary | ICD-10-CM | POA: Diagnosis not present

## 2017-03-29 DIAGNOSIS — M25571 Pain in right ankle and joints of right foot: Secondary | ICD-10-CM | POA: Diagnosis not present

## 2017-04-12 DIAGNOSIS — C3490 Malignant neoplasm of unspecified part of unspecified bronchus or lung: Secondary | ICD-10-CM | POA: Diagnosis not present

## 2017-04-12 DIAGNOSIS — J189 Pneumonia, unspecified organism: Secondary | ICD-10-CM | POA: Diagnosis not present

## 2017-04-12 DIAGNOSIS — J449 Chronic obstructive pulmonary disease, unspecified: Secondary | ICD-10-CM | POA: Diagnosis not present

## 2017-04-12 DIAGNOSIS — R42 Dizziness and giddiness: Secondary | ICD-10-CM | POA: Diagnosis not present

## 2017-04-12 DIAGNOSIS — I10 Essential (primary) hypertension: Secondary | ICD-10-CM | POA: Diagnosis not present

## 2017-04-12 DIAGNOSIS — Z23 Encounter for immunization: Secondary | ICD-10-CM | POA: Diagnosis not present

## 2017-04-15 ENCOUNTER — Encounter (HOSPITAL_COMMUNITY): Payer: Self-pay | Admitting: *Deleted

## 2017-04-15 ENCOUNTER — Ambulatory Visit (HOSPITAL_COMMUNITY)
Admission: RE | Admit: 2017-04-15 | Discharge: 2017-04-15 | Disposition: A | Discharge status: Home / Self Care | Payer: Medicare Other | Source: Ambulatory Visit | Attending: Gastroenterology | Admitting: Gastroenterology

## 2017-04-15 ENCOUNTER — Encounter (HOSPITAL_COMMUNITY)
Admission: RE | Disposition: A | Discharge status: Home / Self Care | Payer: Self-pay | Source: Ambulatory Visit | Attending: Gastroenterology

## 2017-04-15 DIAGNOSIS — Z79899 Other long term (current) drug therapy: Secondary | ICD-10-CM | POA: Insufficient documentation

## 2017-04-15 DIAGNOSIS — D122 Benign neoplasm of ascending colon: Secondary | ICD-10-CM | POA: Diagnosis not present

## 2017-04-15 DIAGNOSIS — K5730 Diverticulosis of large intestine without perforation or abscess without bleeding: Secondary | ICD-10-CM | POA: Insufficient documentation

## 2017-04-15 DIAGNOSIS — K648 Other hemorrhoids: Secondary | ICD-10-CM | POA: Insufficient documentation

## 2017-04-15 DIAGNOSIS — I10 Essential (primary) hypertension: Secondary | ICD-10-CM | POA: Diagnosis not present

## 2017-04-15 DIAGNOSIS — M1990 Unspecified osteoarthritis, unspecified site: Secondary | ICD-10-CM | POA: Insufficient documentation

## 2017-04-15 DIAGNOSIS — D124 Benign neoplasm of descending colon: Secondary | ICD-10-CM | POA: Insufficient documentation

## 2017-04-15 DIAGNOSIS — D125 Benign neoplasm of sigmoid colon: Secondary | ICD-10-CM | POA: Diagnosis not present

## 2017-04-15 DIAGNOSIS — Z87891 Personal history of nicotine dependence: Secondary | ICD-10-CM | POA: Insufficient documentation

## 2017-04-15 DIAGNOSIS — D123 Benign neoplasm of transverse colon: Secondary | ICD-10-CM | POA: Diagnosis not present

## 2017-04-15 DIAGNOSIS — Z888 Allergy status to other drugs, medicaments and biological substances status: Secondary | ICD-10-CM | POA: Insufficient documentation

## 2017-04-15 DIAGNOSIS — I714 Abdominal aortic aneurysm, without rupture: Secondary | ICD-10-CM | POA: Diagnosis not present

## 2017-04-15 DIAGNOSIS — Z1211 Encounter for screening for malignant neoplasm of colon: Secondary | ICD-10-CM | POA: Diagnosis not present

## 2017-04-15 DIAGNOSIS — Z7982 Long term (current) use of aspirin: Secondary | ICD-10-CM | POA: Insufficient documentation

## 2017-04-15 DIAGNOSIS — Z86010 Personal history of colonic polyps: Secondary | ICD-10-CM | POA: Insufficient documentation

## 2017-04-15 DIAGNOSIS — D120 Benign neoplasm of cecum: Secondary | ICD-10-CM | POA: Diagnosis not present

## 2017-04-15 HISTORY — PX: POLYPECTOMY: SHX5525

## 2017-04-15 HISTORY — PX: COLONOSCOPY: SHX5424

## 2017-04-15 SURGERY — COLONOSCOPY
Anesthesia: Moderate Sedation

## 2017-04-15 MED ORDER — SODIUM CHLORIDE 0.9 % IV SOLN
INTRAVENOUS | Status: DC
  Administered 2017-04-15: 1000 mL via INTRAVENOUS

## 2017-04-15 MED ORDER — MEPERIDINE HCL 100 MG/ML IJ SOLN
INTRAMUSCULAR | Status: DC | PRN
  Administered 2017-04-15 (×2): 25 mg via INTRAVENOUS

## 2017-04-15 MED ORDER — MIDAZOLAM HCL 5 MG/5ML IJ SOLN
INTRAMUSCULAR | Status: AC
  Filled 2017-04-15: qty 10

## 2017-04-15 MED ORDER — SODIUM CHLORIDE 0.9 % IJ SOLN
INTRAMUSCULAR | Status: DC | PRN
  Administered 2017-04-15: 8 mL via INTRAVENOUS

## 2017-04-15 MED ORDER — MEPERIDINE HCL 100 MG/ML IJ SOLN
INTRAMUSCULAR | Status: AC
  Filled 2017-04-15: qty 2

## 2017-04-15 MED ORDER — MIDAZOLAM HCL 5 MG/5ML IJ SOLN
INTRAMUSCULAR | Status: DC | PRN
  Administered 2017-04-15 (×2): 2 mg via INTRAVENOUS
  Administered 2017-04-15: 1 mg via INTRAVENOUS

## 2017-04-15 NOTE — H&P (Signed)
Primary Care Physician:  Rosita Fire, MD Primary Gastroenterologist:  Dr. Oneida Alar  Pre-Procedure History & Physical: HPI:  Heather [name] is a 76 y.o. female here for PERSONAL HISTORY OF POLYPS.  Past Medical History:  Diagnosis Date  . Arthritis   . Ascending aortic aneurysm ([place]) 07/20/2014   4.1 cm by CT  . Atrial fib/flutter, transient    post-op  . Constipation    corrects with diet  . Floater, vitreous    Right  . Hypertension   . 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 Pneumonia   . Psoriasis    Ankles  . Tobacco abuse     Past Surgical History:  Procedure Laterality Date  . CATARACT EXTRACTION Bilateral 2016  . COLONOSCOPY  01/22/2012   Procedure: COLONOSCOPY;  Surgeon: Danie Binder, MD;  [facility];  Service: Endoscopy;  Laterality: N/A;  10:45  . right VATS (upper lobectomy)  08/29/11   Dr Roxan Hockey  . VIDEO BRONCHOSCOPY WITH ENDOBRONCHIAL NAVIGATION N/A 03/18/2015   Procedure: VIDEO BRONCHOSCOPY WITH ENDOBRONCHIAL NAVIGATION;  Surgeon: [name], MD;  Location: [facility];  Service: Thoracic;  Laterality: N/A;    Prior to Admission medications   Medication Sig Start Date End Date Taking? Authorizing Provider  alendronate (FOSAMAX) 70 MG tablet Take 70 mg by mouth every Monday.  07/06/14  Yes [provider]  aspirin 81 MG tablet Take 81 mg by mouth at bedtime.    Yes [provider]  calcium-vitamin D (OSCAL WITH D) 500-200 MG-UNIT tablet Take 1 tablet by mouth 3 (three) times daily.   Yes [provider]  glucosamine-chondroitin 500-400 MG tablet Take 1 tablet by mouth daily.   Yes [provider]  hydrochlorothiazide (MICROZIDE) 12.5 MG capsule Take 12.5 mg by mouth daily as needed for fluid. 03/05/17  Yes [provider]  meclizine (ANTIVERT) 12.5 MG tablet Take 12.5 mg by mouth 2 (two) times daily as needed for dizziness.   Yes  [provider]  metoprolol tartrate (LOPRESSOR) 25 MG tablet Take 25 mg by mouth 2 (two) times daily.  10/23/12  Yes [provider]  Multiple Vitamins-Iron (MULTIVITAMINS WITH IRON) TABS Take 1 tablet by mouth daily.   Yes [provider]  Omega-3 Fatty Acids (FISH OIL) 500 MG CAPS Take 500 mg by mouth daily.    Yes [provider]  polyethylene glycol-electrolytes (TRILYTE) 420 g solution Take 4,000 mLs by mouth as directed. 02/06/17  Yes Giavanni Odonovan, Marga Melnick, MD  vitamin B-12 (CYANOCOBALAMIN) 500 MCG tablet Take 500 mcg by mouth daily.   Yes [provider]  polyethylene glycol-electrolytes (TRILYTE) 420 g solution Take 4,000 mLs by mouth as directed. 03/20/17   Danie Binder, MD    Allergies as of 02/06/2017 - Review Complete 02/06/2017  Allergen Reaction Noted  . Ibuprofen Nausea And Vomiting 01/07/2012    Family History  Problem Relation Age of Onset  . Hypertension Mother   . Heart disease Mother   . Heart disease Sister   . Heart disease Brother   . Colon cancer Neg Hx     Social History   Social History  . Marital status: Widowed    Spouse name: N/A  . Number of children: 3  . Years of education: N/A   Occupational History  . retired    Social History Main Topics  . Smoking status: Former Smoker    Packs/day:  0.30    Years: 20.00    Types: Cigarettes    Quit date: 01/12/2014  . Smokeless tobacco: Never Used  . Alcohol use No  . Drug use: No  . Sexual activity: Not on file   Other Topics Concern  . Not on file   Social History Narrative   Lives w/ cousin    Review of Systems: See HPI, otherwise negative ROS   Physical Exam: BP (!) 155/76   Pulse (!) 52   Temp 97.7 F (36.5 C) (Oral)   Resp 16   Ht 5' 7.5" (1.715 m)   Wt 160 lb (72.6 kg)   SpO2 100%   BMI 24.69 kg/m  General:   Alert,  pleasant and cooperative in NAD Head:  Normocephalic and atraumatic. Neck:  Supple; Lungs:  Clear throughout to  auscultation.    Heart:  Regular rate and rhythm. Abdomen:  Soft, nontender and nondistended. Normal bowel sounds, without guarding, and without rebound.   Neurologic:  Alert and  oriented x4;  grossly normal neurologically.  Impression/Plan:     PERSONAL HISTORY OF POLYPS.  PLAN: 1. TCS TODAY DISCUSSED PROCEDURE, BENEFITS, & RISKS: < 1% chance of medication reaction, bleeding, perforation, or rupture of spleen/liver.

## 2017-04-15 NOTE — Op Note (Signed)
[facility] Patient Name: Heather [name] Procedure Date: 04/15/2017 2:26 PM MRN: [id number] Date of Birth: Dec 18, 1940 Attending MD: Barney Drain MD, MD CSN: [id number] Age: 76 Admit Type: Outpatient Procedure:                Colonoscopy-EMR/cold snare-forceps & snare cautery                            polypectomy, SALINE INJECTION Indications:              High risk colon cancer surveillance: Personal                            history of colonic polyps Providers:                Barney Drain MD, MD, Selena Lesser, Lurline Del,                            RN, Randa Spike, Technician Referring MD:             Rosita Fire MD, MD Medicines:                Meperidine 50 mg IV, Midazolam 4 mg IV Complications:            No immediate complications. Estimated Blood Loss:     Estimated blood loss was minimal. Procedure:                Pre-Anesthesia Assessment:                           - Prior to the procedure, a History and Physical                            was performed, and patient medications and                            allergies were reviewed. The patient's tolerance of                            previous anesthesia was also reviewed. The risks                            and benefits of the procedure and the sedation                            options and risks were discussed with the patient.                            All questions were answered, and informed consent                            was obtained. Prior Anticoagulants: The patient has                            taken aspirin, last dose was 1 day prior to  procedure. ASA Grade Assessment: II - A patient                            with mild systemic disease. After reviewing the                            risks and benefits, the patient was deemed in                            satisfactory condition to undergo the procedure.                            After obtaining informed consent, the  colonoscope                            was passed under direct vision. Throughout the                            procedure, the patient's blood pressure, pulse, and                            oxygen saturations were monitored continuously. The                            EC-[id number] ([id number]) scope was introduced through                            the anus and advanced to the the cecum, identified                            by appendiceal orifice and ileocecal valve. The                            colonoscopy was technically difficult and complex                            due to a tortuous colon. Successful completion of                            the procedure was aided by increasing the dose of                            sedation medication and COLOWRAP. The patient                            tolerated the procedure well. The quality of the                            bowel preparation was excellent. The ileocecal                            valve, appendiceal orifice, and rectum were  photographed. Scope In: 3:05:56 PM Scope Out: 4:19:22 PM Scope Withdrawal Time: 1 hour 6 minutes 35 seconds  Total Procedure Duration: 1 hour 13 minutes 26 seconds  Findings:      Four sessile polyps were found in the descending colon, ascending colon       and cecum. The polyps were 4 to 6 mm in size. These polyps were removed       with a hot snare. Resection and retrieval were complete. To prevent       bleeding after the polypectomy, one hemostatic clip was successfully       placed (MR conditional). There was no bleeding at the end of the       procedure.      A 15 mm polyp was found in the proximal ascending colon. The polyp was       carpet-like and flat. Area was successfully injected with 8 mL saline       for a lift polypectomy. Polypectomy was attempted, initially using a hot       snare. Polyp resection was incomplete with this device. This       intervention then  required a different device and polypectomy technique.       The polyp was removed with a cold snare. Resection and retrieval were       complete. Coagulation for destruction of remaining portion of lesion       using argon plasma at 0.5 liters/minute and 20 watts was successful. To       prevent bleeding after the polypectomy, three hemostatic clips were       successfully placed (MR conditional). There was no bleeding at the end       of the procedure.      A 4 mm polyp was found in the hepatic flexure. The polyp was sessile.       Polypectomy was attempted, initially using a cold biopsy forceps. Polyp       resection was incomplete with this device. This intervention then       required a different device and polypectomy technique. The polyp was       removed with a cold snare. Resection and retrieval were complete.      Two sessile polyps were found in the sigmoid colon and descending colon.       The polyps were 3 to 4 mm in size. These polyps were removed with a cold       snare. Resection and retrieval were complete.      Multiple small and large-mouthed diverticula were found in the entire       colon.      Internal hemorrhoids were found during retroflexion. The hemorrhoids       were moderate. Impression:               - Four 4 to 6 mm polyps in the descending colon, in                            the ascending colon(btl 1) and in the cecum(btl 1),                            removed with a hot snare. Resected and retrieved.  Clip (MR conditional) was placed.                           - One 15 mm polyp in the proximal ascending                            colon(btl 1), removed with a cold snare. Resected                            and retrieved. Injected. Treated with argon plasma                            coagulation (APC). Clips (MR conditional) were                            placed.                           - One 4 mm polyp at the hepatic flexure(btl  1),                            removed with a cold snare. Resected and retrieved.                           - Two 3 to 4 mm polyps in the sigmoid colon and in                            the descending colon, removed with a cold snare.                            Resected and retrieved.                           - Diverticulosis in the entire examined colon.                           - Internal hemorrhoids. Moderate Sedation:      Moderate (conscious) sedation was administered by the endoscopy nurse       and supervised by the endoscopist. The following parameters were       monitored: oxygen saturation, heart rate, blood pressure, and response       to care. Total physician intraservice time was 86 minutes. Recommendation:           - Repeat colonoscopy in 3 - 5 years for                            surveillance IF THE BENEFITS OUTWEIGH THE RISKS.                           - High fiber diet. NO MRI UNTIL KUB SHOWS CLIPS                            HAVE PASSED.                           -  Continue present medications.                           - Await pathology results.                           - Patient has a contact number available for                            emergencies. The signs and symptoms of potential                            delayed complications were discussed with the                            patient. Return to normal activities tomorrow.                            Written discharge instructions were provided to the                            patient. Procedure Code(s):        --- Professional ---                           [phone], Colonoscopy, flexible; with removal of                            tumor(s), polyp(s), or other lesion(s) by snare                            technique                           45381, Colonoscopy, flexible; with directed                            submucosal injection(s), any substance                           99152, Moderate sedation services  provided by the                            same physician or other qualified health care                            professional performing the diagnostic or                            therapeutic service that the sedation supports,                            requiring the presence of an independent trained                            observer to assist in the monitoring of the  patient's level of consciousness and physiological                            status; initial 15 minutes of intraservice time,                            patient age 15 years or older                           [phone], Moderate sedation services; each additional                            15 minutes intraservice time                           99153, Moderate sedation services; each additional                            15 minutes intraservice time                           99153, Moderate sedation services; each additional                            15 minutes intraservice time                           99153, Moderate sedation services; each additional                            15 minutes intraservice time                           99153, Moderate sedation services; each additional                            15 minutes intraservice time Diagnosis Code(s):        --- Professional ---                           Z86.010, Personal history of colonic polyps                           D12.2, Benign neoplasm of ascending colon                           D12.0, Benign neoplasm of cecum                           D12.5, Benign neoplasm of sigmoid colon                           D12.4, Benign neoplasm of descending colon                           D12.3, Benign neoplasm of transverse colon (hepatic  flexure or splenic flexure)                           K64.8, Other hemorrhoids                           K57.30, Diverticulosis of large intestine without                             perforation or abscess without bleeding CPT copyright 2016 [employer]. All rights reserved. The codes documented in this report are preliminary and upon coder review may  be revised to meet current compliance requirements. Barney Drain, MD Barney Drain MD, MD 04/15/2017 4:41:14 PM This report has been signed electronically. Number of Addenda: 0

## 2017-04-15 NOTE — Discharge Instructions (Signed)
You have MODERATE internal hemorrhoids, WHICH CAN CAUSE FOR RECTAL BLEEDING. YOU HAVE diverticulosis IN YOUR LEFT AND RIGHT COLON. YOU HAD EIGHT POLYPS REMOVED.    DRINK WATER TO KEEP YOUR URINE LIGHT YELLOW.  FOLLOW A HIGH FIBER DIET. AVOID ITEMS THAT CAUSE BLOATING. See info below.  USE PREPARATION H FOUR TIMES A DAY IF NEEDED TO RELIEVE RECTAL PAIN/PRESSURE/BLEEDING.   YOUR BIOPSY RESULTS WILL BE AVAILABLE IN [facility] CHART  Oct 11 AND MY OFFICE WILL CONTACT YOU IN 10-14 DAYS WITH YOUR RESULTS.   Next colonoscopy in 3-5 if the benefits outweigh the risks.  Colonoscopy Care After Read the instructions outlined below and refer to this sheet in the next week. These discharge instructions provide you with general information on caring for yourself after you leave the hospital. While your treatment has been planned according to the most current medical practices available, unavoidable complications occasionally occur. If you have any problems or questions after discharge, call DR. Shiryl Ruddy, [phone].  ACTIVITY  You may resume your regular activity, but move at a slower pace for the next 24 hours.   Take frequent rest periods for the next 24 hours.   Walking will help get rid of the air and reduce the bloated feeling in your belly (abdomen).   No driving for 24 hours (because of the medicine (anesthesia) used during the test).   You may shower.   Do not sign any important legal documents or operate any machinery for 24 hours (because of the anesthesia used during the test).    NUTRITION  Drink plenty of fluids.   You may resume your normal diet as instructed by your doctor.   Begin with a light meal and progress to your normal diet. Heavy or fried foods are harder to digest and may make you feel sick to your stomach (nauseated).   Avoid alcoholic beverages for 24 hours or as instructed.    MEDICATIONS  You may resume your normal medications.   WHAT YOU CAN EXPECT  TODAY  Some feelings of bloating in the abdomen.   Passage of more gas than usual.   Spotting of blood in your stool or on the toilet paper  .  IF YOU HAD POLYPS REMOVED DURING THE COLONOSCOPY:  Eat a soft diet IF YOU HAVE NAUSEA, BLOATING, ABDOMINAL PAIN, OR VOMITING.    FINDING OUT THE RESULTS OF YOUR TEST Not all test results are available during your visit. DR. Oneida Alar WILL CALL YOU WITHIN 14 DAYS OF YOUR PROCEDUE WITH YOUR RESULTS. Do not assume everything is normal if you have not heard from DR. Haroldine Redler, CALL HER OFFICE AT [phone].  SEEK IMMEDIATE MEDICAL ATTENTION AND CALL THE OFFICE: [phone] IF:  You have more than a spotting of blood in your stool.   Your belly is swollen (abdominal distention).   You are nauseated or vomiting.   You have a temperature over 101F.   You have abdominal pain or discomfort that is severe or gets worse throughout the day.  High-Fiber Diet A high-fiber diet changes your normal diet to include more whole grains, legumes, fruits, and vegetables. Changes in the diet involve replacing refined carbohydrates with unrefined foods. The calorie level of the diet is essentially unchanged. The Dietary Reference Intake (recommended amount) for adult males is 38 grams per day. For adult females, it is 25 grams per day. Pregnant and lactating women should consume 28 grams of fiber per day. Fiber is the intact part of a plant that is not  broken down during digestion. Functional fiber is fiber that has been isolated from the plant to provide a beneficial effect in the body. PURPOSE  Increase stool bulk.   Ease and regulate bowel movements.   Lower cholesterol.  REDUCE RISK OF COLON CANCER  INDICATIONS THAT YOU NEED MORE FIBER  Constipation and hemorrhoids.   Uncomplicated diverticulosis (intestine condition) and irritable bowel syndrome.   Weight management.   As a protective measure against hardening of the arteries (atherosclerosis),  diabetes, and cancer.   GUIDELINES FOR INCREASING FIBER IN THE DIET  Start adding fiber to the diet slowly. A gradual increase of about 5 more grams (2 slices of whole-wheat bread, 2 servings of most fruits or vegetables, or 1 bowl of high-fiber cereal) per day is best. Too rapid an increase in fiber may result in constipation, flatulence, and bloating.   Drink enough water and fluids to keep your urine clear or pale yellow. Water, juice, or caffeine-free drinks are recommended. Not drinking enough fluid may cause constipation.   Eat a variety of high-fiber foods rather than one type of fiber.   Try to increase your intake of fiber through using high-fiber foods rather than fiber pills or supplements that contain small amounts of fiber.   The goal is to change the types of food eaten. Do not supplement your present diet with high-fiber foods, but replace foods in your present diet.   INCLUDE A VARIETY OF FIBER SOURCES  Replace refined and processed grains with whole grains, canned fruits with fresh fruits, and incorporate other fiber sources. White rice, white breads, and most bakery goods contain little or no fiber.   Brown whole-grain rice, buckwheat oats, and many fruits and vegetables are all good sources of fiber. These include: broccoli, Brussels sprouts, cabbage, cauliflower, beets, sweet potatoes, white potatoes (skin on), carrots, tomatoes, eggplant, squash, berries, fresh fruits, and dried fruits.   Cereals appear to be the richest source of fiber. Cereal fiber is found in whole grains and bran. Bran is the fiber-rich outer coat of cereal grain, which is largely removed in refining. In whole-grain cereals, the bran remains. In breakfast cereals, the largest amount of fiber is found in those with "bran" in their names. The fiber content is sometimes indicated on the label.   You may need to include additional fruits and vegetables each day.   In baking, for 1 cup white flour, you may  use the following substitutions:   1 cup whole-wheat flour minus 2 tablespoons.   1/2 cup white flour plus 1/2 cup whole-wheat flour.   Polyps, Colon  A polyp is extra tissue that grows inside your body. Colon polyps grow in the large intestine. The large intestine, also called the colon, is part of your digestive system. It is a long, hollow tube at the end of your digestive tract where your body makes and stores stool. Most polyps are not dangerous. They are benign. This means they are not cancerous. But over time, some types of polyps can turn into cancer. Polyps that are smaller than a pea are usually not harmful. But larger polyps could someday become or may already be cancerous. To be safe, doctors remove all polyps and test them.   PREVENTION There is not one sure way to prevent polyps. You might be able to lower your risk of getting them if you:  Eat more fruits and vegetables and less fatty food.   Do not smoke.   Avoid alcohol.   Exercise every  day.   Lose weight if you are overweight.   Eating more calcium and folate can also lower your risk of getting polyps. Some foods that are rich in calcium are milk, cheese, and broccoli. Some foods that are rich in folate are chickpeas, kidney beans, and spinach.    Diverticulosis Diverticulosis is a common condition that develops when small pouches (diverticula) form in the wall of the colon. The risk of diverticulosis increases with age. It happens more often in people who eat a low-fiber diet. Most individuals with diverticulosis have no symptoms. Those individuals with symptoms usually experience belly (abdominal) pain, constipation, or loose stools (diarrhea).  HOME CARE INSTRUCTIONS  Increase the amount of fiber in your diet as directed by your caregiver or dietician. This may reduce symptoms of diverticulosis.   Drink at least 6 to 8 glasses of water each day to prevent constipation.   Try not to strain when you have a bowel  movement.   Avoiding nuts and seeds to prevent complications is NOT NECESSARY.     FOODS HAVING HIGH FIBER CONTENT INCLUDE:  Fruits. Apple, peach, pear, tangerine, raisins, prunes.   Vegetables. Brussels sprouts, asparagus, broccoli, cabbage, carrot, cauliflower, romaine lettuce, spinach, summer squash, tomato, winter squash, zucchini.   Starchy Vegetables. Baked beans, kidney beans, lima beans, split peas, lentils, potatoes (with skin).   Grains. Whole wheat bread, brown rice, bran flake cereal, plain oatmeal, white rice, shredded wheat, bran muffins.    SEEK IMMEDIATE MEDICAL CARE IF:  You develop increasing pain or severe bloating.   You have an oral temperature above 101F.   You develop vomiting or bowel movements that are bloody or black.   Hemorrhoids Hemorrhoids are dilated (enlarged) veins around the rectum. Sometimes clots will form in the veins. This makes them swollen and painful. These are called thrombosed hemorrhoids. Causes of hemorrhoids include:  Constipation.   Straining to have a bowel movement.   HEAVY LIFTING  HOME CARE INSTRUCTIONS  Eat a well balanced diet and drink 6 to 8 glasses of water every day to avoid constipation. You may also use a bulk laxative.   Avoid straining to have bowel movements.   Keep anal area dry and clean.   Do not use a donut shaped pillow or sit on the toilet for long periods. This increases blood pooling and pain.   Move your bowels when your body has the urge; this will require less straining and will decrease pain and pressure.

## 2017-05-01 ENCOUNTER — Encounter (HOSPITAL_COMMUNITY): Payer: Self-pay | Admitting: Gastroenterology

## 2017-05-01 ENCOUNTER — Telehealth: Payer: Self-pay | Admitting: Gastroenterology

## 2017-05-01 NOTE — Telephone Encounter (Signed)
Please call pt. She had EIGHT simple adenomas removed.   DRINK WATER TO KEEP YOUR URINE LIGHT YELLOW.  FOLLOW A HIGH FIBER DIET. AVOID ITEMS THAT CAUSE BLOATING.   USE PREPARATION H FOUR TIMES A DAY IF NEEDED TO RELIEVE RECTAL PAIN/PRESSURE/BLEEDING.   Next colonoscopy in 3-5 if the benefits outweigh the risks.

## 2017-05-02 ENCOUNTER — Other Ambulatory Visit: Payer: Self-pay | Admitting: *Deleted

## 2017-05-02 DIAGNOSIS — R918 Other nonspecific abnormal finding of lung field: Secondary | ICD-10-CM

## 2017-05-02 DIAGNOSIS — R911 Solitary pulmonary nodule: Secondary | ICD-10-CM

## 2017-05-02 NOTE — Telephone Encounter (Signed)
Called and informed pt. She requested high fiber diet info. Info mailed to her.

## 2017-05-02 NOTE — Telephone Encounter (Signed)
ON RECALL  °

## 2017-05-13 ENCOUNTER — Telehealth: Payer: Self-pay | Admitting: Gastroenterology

## 2017-05-13 NOTE — Telephone Encounter (Signed)
PT said she is worried because she had clamps in at colonoscopy. She will see her lung Dr. 06/04/2017 and he will probably order a CT scan. She wants to know when she should have the KUB for the clamps.  Please advise!

## 2017-05-13 NOTE — Telephone Encounter (Signed)
Pt said she had colonoscopy on 10/8 and SF put clamps on her and has questions for the nurse. Please call [phone]

## 2017-05-13 NOTE — Telephone Encounter (Signed)
PLEASE CALL PT. The clips will not cause any problems with her getting her CT scan.

## 2017-05-13 NOTE — Telephone Encounter (Signed)
PT is aware.

## 2017-06-04 ENCOUNTER — Ambulatory Visit (INDEPENDENT_AMBULATORY_CARE_PROVIDER_SITE_OTHER): Payer: Medicare Other | Admitting: Thoracic Surgery (Cardiothoracic Vascular Surgery)

## 2017-06-04 ENCOUNTER — Other Ambulatory Visit: Payer: Self-pay

## 2017-06-04 ENCOUNTER — Ambulatory Visit
Admission: RE | Admit: 2017-06-04 | Discharge: 2017-06-04 | Disposition: A | Discharge status: Home / Self Care | Payer: Medicare Other | Source: Ambulatory Visit | Attending: Thoracic Surgery (Cardiothoracic Vascular Surgery) | Admitting: Thoracic Surgery (Cardiothoracic Vascular Surgery)

## 2017-06-04 ENCOUNTER — Encounter: Payer: Self-pay | Admitting: Thoracic Surgery (Cardiothoracic Vascular Surgery)

## 2017-06-04 ENCOUNTER — Other Ambulatory Visit: Payer: Self-pay | Admitting: *Deleted

## 2017-06-04 VITALS — BP 146/93 | HR 51 | Ht 66.5 in | Wt 165.0 lb

## 2017-06-04 DIAGNOSIS — R911 Solitary pulmonary nodule: Secondary | ICD-10-CM

## 2017-06-04 DIAGNOSIS — C3491 Malignant neoplasm of unspecified part of right bronchus or lung: Secondary | ICD-10-CM

## 2017-06-04 DIAGNOSIS — R918 Other nonspecific abnormal finding of lung field: Secondary | ICD-10-CM

## 2017-06-04 DIAGNOSIS — I7121 Aneurysm of the ascending aorta, without rupture: Secondary | ICD-10-CM

## 2017-06-04 DIAGNOSIS — I712 Thoracic aortic aneurysm, without rupture: Secondary | ICD-10-CM | POA: Diagnosis not present

## 2017-06-04 DIAGNOSIS — Z85118 Personal history of other malignant neoplasm of bronchus and lung: Secondary | ICD-10-CM

## 2017-06-04 NOTE — Progress Notes (Signed)
[address]       [place] [address]             [phone]       HPI: [name] returns for scheduled annual follow-up visit  [name] is a 76 year old woman who had a right upper lobectomy for stage Ib non-small cell carcinoma in March 2013.  It was a moderately differentiated adenocarcinoma.  We followed her with CTs since that time.  She has had multiple groundglass opacities bilaterally.  In 2016 the left upper lobe groundglass opacity appeared larger.  Navigational bronchoscopy was nondiagnostic.  She refused stereotactic radiation.  In 2017 the nodules were stable.  She also has a 4.1 cm ascending aneurysm.  She says she is lost about 20 pounds over the past year with dieting.  Her appetite is good.  She has not had any problems with her breathing.  She did say that she was found to have 2 cysts in her left breast.  She denies any chest pain, pressure, or tightness.  She denies any significant shortness of breath.  She quit smoking in 2015.  Past Medical History:  Diagnosis Date  . Arthritis   . Ascending aortic aneurysm ([place]) 07/20/2014   4.1 cm by CT  . Atrial fib/flutter, transient    post-op  . Constipation    corrects with diet  . Floater, vitreous    Right  . Hypertension   . Nonsquamous nonsmall cell neoplasm of lung ([place]) 09/2011   Stage IB- s/p right upper lobectomy  . Pneumonia   . Psoriasis    Ankles  . Tobacco abuse     Current Outpatient Medications  Medication Sig Dispense Refill  . alendronate (FOSAMAX) 70 MG tablet Take 70 mg by mouth every Monday.   3  . calcium-vitamin D (OSCAL WITH D) 500-200 MG-UNIT tablet Take 1 tablet by mouth 3 (three) times daily.    Marland Kitchen glucosamine-chondroitin 500-400 MG tablet Take 1 tablet by mouth daily.    . hydrochlorothiazide (MICROZIDE) 12.5 MG capsule Take 12.5 mg by mouth daily as needed for fluid.  3  . meclizine (ANTIVERT) 12.5 MG tablet Take 12.5 mg by mouth 2 (two) times daily as needed  for dizziness.    . metoprolol tartrate (LOPRESSOR) 25 MG tablet Take 25 mg by mouth 2 (two) times daily.     . Multiple Vitamins-Iron (MULTIVITAMINS WITH IRON) TABS Take 1 tablet by mouth daily.    . Omega-3 Fatty Acids (FISH OIL) 500 MG CAPS Take 500 mg by mouth daily.     . vitamin B-12 (CYANOCOBALAMIN) 500 MCG tablet Take 500 mcg by mouth daily.     No current facility-administered medications for this visit.     Physical Exam BP (!) 146/93 (BP Location: Left Arm, Patient Position: Sitting, Cuff Size: Normal)   Pulse (!) 51   Ht 5' 6.5" (1.689 m)   Wt 165 lb (74.8 kg)   SpO2 98%   BMI 26.53 kg/m  76 year old woman in no acute distress Alert and oriented x3 with no focal deficits Lungs clear to auscultation, no rales or wheezing Cardiac regular rate and rhythm normal S1 and S2 No cervical or supraclavicular adenopathy No clubbing, cyanosis or edema  Diagnostic Tests: CT CHEST WITHOUT CONTRAST  TECHNIQUE: Multidetector CT imaging of the chest was performed following the standard protocol without IV contrast.  COMPARISON:  05/29/2016  FINDINGS: Cardiovascular: Moderate cardiac enlargement. No pericardial effusion identified. Aortic atherosclerosis. Calcification  in the LAD and left circumflex coronary artery is noted.  Mediastinum/Nodes: The trachea appears patent and midline. Unremarkable appearance of the esophagus. No mediastinal or hilar adenopathy.  Lungs/Pleura: Mild emphysema. Multifocal ground-glass attenuating nodules are again noted within both lungs. The index nodule within the left upper lobe measures 1.7 cm, image 24 of series 4. Unchanged from previous exam. Nodule within the superior segment of the right lower lobe measures 8 mm and is unchanged from previous exam, image 27 of series 4. Within the anteromedial left upper lobe there is a solid-appearing nodule which measures 1.0 x 1.6 x 1.0 cm, image 51 of series 4. This is increased from 0.8 x 1.1  x 0.6 cm. Shot other scattered nodules are unchanged in the interval.  Upper Abdomen: Gallstones identified. There is aortic atherosclerosis noted. The suprarenal abdominal aorta appears increased in caliber measuring 3.2 cm, image 119 of series 3.  Musculoskeletal: Degenerative disc disease is identified within the thoracic spine. No aggressive lytic or sclerotic bone lesions.  IMPRESSION: 1. Increase in size of solid-appearing nodule within the medial left upper lobe. Consider further evaluation with PET-CT. 2. Sub solid nodules scattered throughout both lungs are stable in the interval. 3. Aortic Atherosclerosis (ICD10-I70.0) and Emphysema (ICD10-J43.9). 4. Multi vessel coronary artery calcification.   Electronically Signed   By: Kerby Moors M.D.   On: 06/04/2017 11:42 I personally reviewed the CT images and concur with the findings noted above. Ascending aneurysm was not mentioned but is unchanged.  Impression: [name] is a 76 year old woman who had a 5 years ago for a stage Ib non-small cell carcinoma.  We have been following her since then for multiple groundglass opacities in both lungs.  On her CT today the groundglass opacities are all relatively unchanged, but there is an increase in the size of a more solid nodule in the anterior left upper lobe.  This was previously less than a centimeter but now measures 1 x 1.6 x 1 cm.  Although the appearance is a little unusual, given her history we need to consider the possibility this is a new primary bronchogenic carcinoma.  I recommended that we proceed with PET/CT to guide an initial diagnostic workup of the left upper lobe nodule.  Ascending aneurysm-unchanged over the past year.  Continue annual follow-up.  Plan: PET/CT to further evaluate left upper lobe nodule  Return in 2 weeks to discuss results  [name], MD [facility] [phone]

## 2017-06-18 ENCOUNTER — Ambulatory Visit (HOSPITAL_COMMUNITY): Payer: Medicare Other

## 2017-06-18 ENCOUNTER — Encounter: Payer: Medicare Other | Admitting: Thoracic Surgery (Cardiothoracic Vascular Surgery)

## 2017-06-19 ENCOUNTER — Encounter: Payer: Self-pay | Admitting: Gastroenterology

## 2017-07-12 ENCOUNTER — Ambulatory Visit (HOSPITAL_COMMUNITY): Payer: Medicare Other

## 2017-07-16 ENCOUNTER — Ambulatory Visit (INDEPENDENT_AMBULATORY_CARE_PROVIDER_SITE_OTHER): Payer: Medicare Other | Admitting: Thoracic Surgery (Cardiothoracic Vascular Surgery)

## 2017-07-16 ENCOUNTER — Encounter (HOSPITAL_COMMUNITY)
Admission: RE | Admit: 2017-07-16 | Discharge: 2017-07-16 | Disposition: A | Discharge status: Home / Self Care | Payer: Medicare Other | Source: Ambulatory Visit | Attending: Thoracic Surgery (Cardiothoracic Vascular Surgery) | Admitting: Thoracic Surgery (Cardiothoracic Vascular Surgery)

## 2017-07-16 ENCOUNTER — Encounter: Payer: Self-pay | Admitting: Thoracic Surgery (Cardiothoracic Vascular Surgery)

## 2017-07-16 ENCOUNTER — Other Ambulatory Visit: Payer: Self-pay

## 2017-07-16 ENCOUNTER — Other Ambulatory Visit: Payer: Self-pay | Admitting: *Deleted

## 2017-07-16 VITALS — BP 141/89 | HR 80 | Resp 18 | Ht 66.5 in | Wt 172.8 lb

## 2017-07-16 DIAGNOSIS — R911 Solitary pulmonary nodule: Secondary | ICD-10-CM

## 2017-07-16 DIAGNOSIS — Z85118 Personal history of other malignant neoplasm of bronchus and lung: Secondary | ICD-10-CM | POA: Diagnosis not present

## 2017-07-16 DIAGNOSIS — C3491 Malignant neoplasm of unspecified part of right bronchus or lung: Secondary | ICD-10-CM | POA: Diagnosis not present

## 2017-07-16 LAB — GLUCOSE, CAPILLARY: Glucose-Capillary: 90 mg/dL (ref 65–99)

## 2017-07-16 MED ORDER — FLUDEOXYGLUCOSE F - 18 (FDG) INJECTION
8.2000 | Freq: Once | INTRAVENOUS | Status: AC | PRN
  Administered 2017-07-16: 8.2 via INTRAVENOUS

## 2017-07-16 NOTE — Progress Notes (Signed)
[address]       [place] [address]             [phone]       HPI: Mrs. Grindle returns today to discuss results for PET/CT  She is a 77 year old woman with a history of a right upper lobectomy for stage IB non-small cell carcinoma in March 2013.  She been followed with CTs since that time.  She has had multiple groundglass opacities bilaterally.  Back in 2016 1 of her left upper lobe GGO's appeared larger.  Navigational bronchoscopy was negative.  She saw Dr. Tammi Klippel but ultimately refused stereotactic radiation.  Her nodules remained relatively stable until I saw her again in November 2018.  There was an increase in the size of a nodule in the left upper lobe.  This also appears more solid.    Past Medical History:  Diagnosis Date  . Arthritis   . Ascending aortic aneurysm ([place]) 07/20/2014   4.1 cm by CT  . Atrial fib/flutter, transient    post-op  . Constipation    corrects with diet  . Floater, vitreous    Right  . Hypertension   . Nonsquamous nonsmall cell neoplasm of lung ([place]) 09/2011   Stage IB- s/p right upper lobectomy  . Pneumonia   . Psoriasis    Ankles  . Tobacco abuse     Current Outpatient Medications  Medication Sig Dispense Refill  . alendronate (FOSAMAX) 70 MG tablet Take 70 mg by mouth every Monday.   3  . calcium-vitamin D (OSCAL WITH D) 500-200 MG-UNIT tablet Take 1 tablet by mouth 3 (three) times daily.    Marland Kitchen glucosamine-chondroitin 500-400 MG tablet Take 1 tablet by mouth daily.    . hydrochlorothiazide (MICROZIDE) 12.5 MG capsule Take 12.5 mg by mouth daily as needed for fluid.  3  . meclizine (ANTIVERT) 12.5 MG tablet Take 12.5 mg by mouth 2 (two) times daily as needed for dizziness.    . metoprolol tartrate (LOPRESSOR) 25 MG tablet Take 25 mg by mouth 2 (two) times daily.     . Multiple Vitamins-Iron (MULTIVITAMINS WITH IRON) TABS Take 1 tablet by mouth daily.    . Omega-3 Fatty Acids (FISH OIL) 500 MG CAPS Take 500 mg by  mouth daily.     . vitamin B-12 (CYANOCOBALAMIN) 500 MCG tablet Take 500 mcg by mouth daily.     No current facility-administered medications for this visit.     Physical Exam  Diagnostic Tests: NUCLEAR MEDICINE PET SKULL BASE TO THIGH  TECHNIQUE: 8.2 mCi F-18 FDG was injected intravenously. Full-ring PET imaging was performed from the skull base to thigh after the radiotracer. CT data was obtained and used for attenuation correction and anatomic localization.  FASTING BLOOD GLUCOSE:  Value: 90 mg/dl  COMPARISON:  CT 06/04/2017  FINDINGS: NECK  No hypermetabolic lymph nodes in the neck.  CHEST  The enlarging nodule in the medial aspect of the LEFT upper lobe measures 13 mm and has moderate metabolic activity with SUV max equal 4.6.  Small semi-solid nodule in the LEFT upper lobe measuring 7 mm (image 65, series 4) also has faint metabolic activity with SUV max equal 1.4.  Ground-glass nodule in the LEFT upper lobe measuring 16 mm (image 55, series 4) has mild metabolic activity with SUV max 2.5  In superior aspect of the RIGHT lower lobe at the surgical margin there is a focus of metabolic activity with SUV max  equal 3.3. There is small nodular lesion at this site measuring 8 mm (image 65, series 4).  Additional scattered pulmonary nodules without metabolic activity.  No hypermetabolic mediastinal lymph nodes.  ABDOMEN/PELVIS  No abnormal hypermetabolic activity within the liver, pancreas, adrenal glands, or spleen. No hypermetabolic lymph nodes in the abdomen or pelvis. Gallstones noted  Uterus normal. Leiomyoma extending from the posterior wall the uterus. Extensive vascular calcification  SKELETON  No focal hypermetabolic activity to suggest skeletal metastasis.  IMPRESSION: 1. Enlarging nodule of concern in the medial aspect of the LEFT upper lobe is hypermetabolic and most consists with bronchogenic carcinoma. 2. Two additional  ground-glass / semi solid nodules in LEFT upper lobe with mild metabolic activity are concerning for adenocarcinoma. 3. Hypermetabolic activity the resection margin at the RIGHT hilum consistent with post therapy inflammation versus local recurrence. Recommend attention on follow-up. 4. No hypermetabolic mediastinal adenopathy or distant disease.   Electronically Signed   By: Suzy Bouchard M.D.   On: 07/16/2017 15:49 I personally reviewed the PET/CT images and concur with the findings noted above.  Impression: Mrs. Mukherjee is a 77 year old woman with a history of a stage IB adenocarcinoma of the right upper lobe treated with lobectomy.  She also has multiple groundglass opacities that have been followed radiographically.  Her most recent CT showed some progression of 2 nodules in the left upper lobe.  Other areas were more stable.  She now has a PET/CT which shows evidence of hypermetabolic activity in these nodules.  Although infectious or inflammatory nodules are in the differential, these most likely are synchronous adenocarcinomas.  Although surgical resection is a possibility she still has some activity on the right side.  I am not sure she would have adequate pulmonary reserve to tolerate a left upper lobectomy having had a previous right upper lobectomy.  We would have to assess her pulmonary function.  I think stereotactic radiation might be a better option for her.  I will plan to discuss her case at our multidisciplinary thoracic oncology conference.  I recommended that we proceed with a CT-guided biopsy and then referral back to Dr. Tammi Klippel to once again consider stereotactic radiation.  Her affect became very strange during this discussion.  I think a lot of this may be due to anxiety.  Plan: CT-guided needle biopsy anterior left upper lobe nodule  Re-referral to Dr. Tammi Klippel to again consider stereotactic radiation  [name], MD [facility] [phone]

## 2017-07-19 ENCOUNTER — Encounter: Payer: Self-pay | Admitting: Thoracic Surgery (Cardiothoracic Vascular Surgery)

## 2017-07-19 DIAGNOSIS — R222 Localized swelling, mass and lump, trunk: Secondary | ICD-10-CM | POA: Diagnosis not present

## 2017-07-19 DIAGNOSIS — C3490 Malignant neoplasm of unspecified part of unspecified bronchus or lung: Secondary | ICD-10-CM | POA: Diagnosis not present

## 2017-07-19 DIAGNOSIS — I10 Essential (primary) hypertension: Secondary | ICD-10-CM | POA: Diagnosis not present

## 2017-07-19 DIAGNOSIS — M810 Age-related osteoporosis without current pathological fracture: Secondary | ICD-10-CM | POA: Diagnosis not present

## 2017-07-19 NOTE — Progress Notes (Signed)
Received referral on patient, patient request to be treated in [place], referral forwarded to [place], faxed medical records to [phone], confirmation received

## 2017-07-22 ENCOUNTER — Ambulatory Visit (HOSPITAL_COMMUNITY)
Admission: RE | Admit: 2017-07-22 | Discharge: 2017-07-22 | Disposition: A | Discharge status: Home / Self Care | Payer: Medicare Other | Source: Ambulatory Visit | Attending: Thoracic Surgery (Cardiothoracic Vascular Surgery) | Admitting: Thoracic Surgery (Cardiothoracic Vascular Surgery)

## 2017-07-23 ENCOUNTER — Other Ambulatory Visit: Payer: Self-pay | Admitting: Radiology

## 2017-07-24 ENCOUNTER — Other Ambulatory Visit: Payer: Self-pay | Admitting: Student

## 2017-07-24 ENCOUNTER — Other Ambulatory Visit: Payer: Self-pay | Admitting: Radiology

## 2017-07-25 ENCOUNTER — Ambulatory Visit (HOSPITAL_COMMUNITY)
Admission: RE | Admit: 2017-07-25 | Discharge: 2017-07-25 | Disposition: A | Discharge status: Home / Self Care | Payer: Medicare Other | Source: Ambulatory Visit | Attending: Thoracic Surgery (Cardiothoracic Vascular Surgery) | Admitting: Thoracic Surgery (Cardiothoracic Vascular Surgery)

## 2017-07-25 ENCOUNTER — Ambulatory Visit (HOSPITAL_COMMUNITY)
Admission: RE | Admit: 2017-07-25 | Discharge: 2017-07-25 | Disposition: A | Discharge status: Home / Self Care | Payer: Medicare Other | Source: Ambulatory Visit | Attending: Interventional Radiology | Admitting: Interventional Radiology

## 2017-07-25 DIAGNOSIS — Z79899 Other long term (current) drug therapy: Secondary | ICD-10-CM | POA: Diagnosis not present

## 2017-07-25 DIAGNOSIS — Z7983 Long term (current) use of bisphosphonates: Secondary | ICD-10-CM | POA: Insufficient documentation

## 2017-07-25 DIAGNOSIS — C3491 Malignant neoplasm of unspecified part of right bronchus or lung: Secondary | ICD-10-CM | POA: Diagnosis not present

## 2017-07-25 DIAGNOSIS — Z902 Acquired absence of lung [part of]: Secondary | ICD-10-CM | POA: Diagnosis not present

## 2017-07-25 DIAGNOSIS — I10 Essential (primary) hypertension: Secondary | ICD-10-CM | POA: Insufficient documentation

## 2017-07-25 DIAGNOSIS — Z8249 Family history of ischemic heart disease and other diseases of the circulatory system: Secondary | ICD-10-CM | POA: Insufficient documentation

## 2017-07-25 DIAGNOSIS — L409 Psoriasis, unspecified: Secondary | ICD-10-CM | POA: Diagnosis not present

## 2017-07-25 DIAGNOSIS — M1990 Unspecified osteoarthritis, unspecified site: Secondary | ICD-10-CM | POA: Diagnosis not present

## 2017-07-25 DIAGNOSIS — C3412 Malignant neoplasm of upper lobe, left bronchus or lung: Secondary | ICD-10-CM | POA: Insufficient documentation

## 2017-07-25 DIAGNOSIS — Z85118 Personal history of other malignant neoplasm of bronchus and lung: Secondary | ICD-10-CM | POA: Diagnosis not present

## 2017-07-25 DIAGNOSIS — Z87891 Personal history of nicotine dependence: Secondary | ICD-10-CM | POA: Insufficient documentation

## 2017-07-25 DIAGNOSIS — R911 Solitary pulmonary nodule: Secondary | ICD-10-CM | POA: Diagnosis not present

## 2017-07-25 DIAGNOSIS — I4891 Unspecified atrial fibrillation: Secondary | ICD-10-CM | POA: Insufficient documentation

## 2017-07-25 DIAGNOSIS — R001 Bradycardia, unspecified: Secondary | ICD-10-CM | POA: Diagnosis not present

## 2017-07-25 DIAGNOSIS — I712 Thoracic aortic aneurysm, without rupture: Secondary | ICD-10-CM | POA: Diagnosis not present

## 2017-07-25 DIAGNOSIS — Z9889 Other specified postprocedural states: Secondary | ICD-10-CM

## 2017-07-25 DIAGNOSIS — Z886 Allergy status to analgesic agent status: Secondary | ICD-10-CM | POA: Diagnosis not present

## 2017-07-25 DIAGNOSIS — R079 Chest pain, unspecified: Secondary | ICD-10-CM | POA: Diagnosis not present

## 2017-07-25 LAB — PROTIME-INR
INR: 1.04
Prothrombin Time: 13.5 seconds (ref 11.4–15.2)

## 2017-07-25 LAB — CBC
HEMATOCRIT: 39.7 % (ref 36.0–46.0)
Hemoglobin: 12.8 g/dL (ref 12.0–15.0)
MCH: 29.4 pg (ref 26.0–34.0)
MCHC: 32.2 g/dL (ref 30.0–36.0)
MCV: 91.1 fL (ref 78.0–100.0)
Platelets: 196 10*3/uL (ref 150–400)
RBC: 4.36 MIL/uL (ref 3.87–5.11)
RDW: 14.1 % (ref 11.5–15.5)
WBC: 6.7 10*3/uL (ref 4.0–10.5)

## 2017-07-25 LAB — APTT: aPTT: 34 seconds (ref 24–36)

## 2017-07-25 MED ORDER — MIDAZOLAM HCL 2 MG/2ML IJ SOLN
INTRAMUSCULAR | Status: AC | PRN
  Administered 2017-07-25 (×2): 1 mg via INTRAVENOUS

## 2017-07-25 MED ORDER — HYDROCODONE-ACETAMINOPHEN 5-325 MG PO TABS
1.0000 | ORAL_TABLET | ORAL | Status: DC | PRN
  Filled 2017-07-25: qty 2

## 2017-07-25 MED ORDER — FENTANYL CITRATE (PF) 100 MCG/2ML IJ SOLN
INTRAMUSCULAR | Status: AC | PRN
  Administered 2017-07-25 (×2): 25 ug via INTRAVENOUS

## 2017-07-25 MED ORDER — LIDOCAINE HCL 1 % IJ SOLN
INTRAMUSCULAR | Status: AC
  Filled 2017-07-25: qty 20

## 2017-07-25 MED ORDER — MIDAZOLAM HCL 2 MG/2ML IJ SOLN
INTRAMUSCULAR | Status: AC
  Filled 2017-07-25: qty 6

## 2017-07-25 MED ORDER — FENTANYL CITRATE (PF) 100 MCG/2ML IJ SOLN
INTRAMUSCULAR | Status: AC
  Filled 2017-07-25: qty 4

## 2017-07-25 MED ORDER — SODIUM CHLORIDE 0.9 % IV SOLN: INTRAVENOUS | Status: DC

## 2017-07-25 NOTE — Sedation Documentation (Signed)
Just for verification, Fentanyl 79mcg is wasted witnessed by [place].

## 2017-07-25 NOTE — Sedation Documentation (Signed)
Pt started to have L upper cp right below the bx site.  She rates it at 7/10.  Kathlene Cote MD made aware.  PCXR ordered.  Pt's sats 100% on RA, in NAD.

## 2017-07-25 NOTE — Discharge Instructions (Signed)
Needle Biopsy of the Lung, Care After °This sheet gives you information about how to care for yourself after your procedure. Your health care provider may also give you more specific instructions. If you have problems or questions, contact your health care provider. °What can I expect after the procedure? °After the procedure, it is common to have: °· Soreness, pain, and tenderness where a tissue sample was taken (biopsy site). °· A cough. °· A sore throat. ° °Follow these instructions at home: °Biopsy site care °· Follow instructions from your health care provider about when to remove the bandage that was placed on the biopsy site. °· Keep the bandage dry until it has been removed. °· Check your biopsy site every day for signs of infection. Check for: °? More redness, swelling, or pain. °? More fluid or blood. °? Warmth to the touch. °? Pus or a bad smell. °General instructions °· Rest as directed by your health care provider. Ask your health care provider what activities are safe for you. °· Do not take baths, swim, or use a hot tub until your health care provider approves. °· Take over-the-counter and prescription medicines only as told by your health care provider. °· If you have airplane travel scheduled, talk with your health care provider about when it is safe for you to travel by airplane. °· It is up to you to get the results of your procedure. Ask your health care provider, or the department that is doing the procedure, when your results will be ready. °· Keep all follow-up visits as told by your health care provider. This is important. °Contact a health care provider if: °· You have more redness, swelling, or pain around your biopsy site. °· You have more fluid or blood coming from your biopsy site. °· Your biopsy site feels warm to the touch. °· You have pus or a bad smell coming from your biopsy site. °· You have a fever. °· You have pain that does not get better with medicine. °Get help right away  if: °· You have problems breathing. °· You have chest pain. °· You cough up blood. °· You faint. °· You have a fast heart rate. °Summary °· After a needle biopsy of the lung, it is common to have a cough, a sore throat, or soreness, pain, and tenderness where a tissue sample was taken (biopsy site). °· You should check your biopsy area every day for signs of infection, including pus or a bad smell, warmth, more fluid or blood, or more redness, swelling, or pain. °· You should not take baths, swim, or use a hot tub until your health care provider approves. °· It is up to you to get the results of your procedure. Ask your health care provider, or the department that is doing the procedure, when your results will be ready. °This information is not intended to replace advice given to you by your health care provider. Make sure you discuss any questions you have with your health care provider. °Document Released: 04/22/2007 Document Revised: 05/16/2016 Document Reviewed: 05/16/2016 °Elsevier Interactive Patient Education © 2017 [employer] ° °Moderate Conscious Sedation, Adult, Care After °These instructions provide you with information about caring for yourself after your procedure. Your health care provider may also give you more specific instructions. Your treatment has been planned according to current medical practices, but problems sometimes occur. Call your health care provider if you have any problems or questions after your procedure. °What can I expect after the   procedure? °After your procedure, it is common: °· To feel sleepy for several hours. °· To feel clumsy and have poor balance for several hours. °· To have poor judgment for several hours. °· To vomit if you eat too soon. ° °Follow these instructions at home: °For at least 24 hours after the procedure: ° °· Do not: °? Participate in activities where you could fall or become injured. °? Drive. °? Use heavy machinery. °? Drink alcohol. °? Take sleeping  pills or medicines that cause drowsiness. °? Make important decisions or sign legal documents. °? Take care of children on your own. °· Rest. °Eating and drinking °· Follow the diet recommended by your health care provider. °· If you vomit: °? Drink water, juice, or soup when you can drink without vomiting. °? Make sure you have little or no nausea before eating solid foods. °General instructions °· Have a responsible adult stay with you until you are awake and alert. °· Take over-the-counter and prescription medicines only as told by your health care provider. °· If you smoke, do not smoke without supervision. °· Keep all follow-up visits as told by your health care provider. This is important. °Contact a health care provider if: °· You keep feeling nauseous or you keep vomiting. °· You feel light-headed. °· You develop a rash. °· You have a fever. °Get help right away if: °· You have trouble breathing. °This information is not intended to replace advice given to you by your health care provider. Make sure you discuss any questions you have with your health care provider. °Document Released: 04/15/2013 Document Revised: 11/28/2015 Document Reviewed: 10/15/2015 °Elsevier Interactive Patient Education © 2018 [employer] ° °

## 2017-07-25 NOTE — H&P (Signed)
Chief Complaint: Patient was seen in consultation today for lung nodule  Referring Physician(s): Crimora C  Supervising Physician: Aletta Edouard  Patient Status: [facility] - Out-pt  History of Present Illness: Heather [name] is a 77 y.o. female with past medical history of arthritis, a fib, HTN, and history of right upper lobectomy for stage 1B non-smell call carcinoma in 2013 who presents with lung nodule in left upper lobe.   CT Chest 06/04/17 showed: 1. Increase in size of solid-appearing nodule within the medial left upper lobe. Consider further evaluation with PET-CT. 2. Sub solid nodules scattered throughout both lungs are stable in the interval. 3. Aortic Atherosclerosis (ICD10-I70.0) and Emphysema (ICD10-J43.9). 4. Multi vessel coronary artery calcification.  PET 07/16/17 shows: 1. Enlarging nodule of concern in the medial aspect of the LEFT upper lobe is hypermetabolic and most consists with bronchogenic carcinoma. 2. Two additional ground-glass / semi solid nodules in LEFT upper lobe with mild metabolic activity are concerning for adenocarcinoma. 3. Hypermetabolic activity the resection margin at the RIGHT hilum consistent with post therapy inflammation versus local recurrence. Recommend attention on follow-up. 4. No hypermetabolic mediastinal adenopathy or distant disease.  Patient presents to radiology department for lung nodule biopsy at the request of Dr. Roxan Hockey.  Case reviewed and approved by Dr. Anselm Pancoast.  Patient has been NPO.  She does not take blood thinners.   Past Medical History:  Diagnosis Date  . Arthritis   . Ascending aortic aneurysm ([place]) 07/20/2014   4.1 cm by CT  . Atrial fib/flutter, transient    post-op  . Constipation    corrects with diet  . Floater, vitreous    Right  . Hypertension   . Nonsquamous nonsmall cell neoplasm of lung ([place]) 09/2011   Stage IB- s/p right upper lobectomy  . Pneumonia   . Psoriasis    Ankles    . Tobacco abuse     Past Surgical History:  Procedure Laterality Date  . CATARACT EXTRACTION Bilateral 2016  . COLONOSCOPY  01/22/2012   Procedure: COLONOSCOPY;  Surgeon: Danie Binder, MD;  Location: [facility] ENDO SUITE;  Service: Endoscopy;  Laterality: N/A;  10:45  . COLONOSCOPY N/A 04/15/2017   Procedure: COLONOSCOPY;  Surgeon: Danie Binder, MD;  Location: [facility] ENDO SUITE;  Service: Endoscopy;  Laterality: N/A;  10:30am  . POLYPECTOMY  04/15/2017   Procedure: POLYPECTOMY;  Surgeon: Danie Binder, MD;  Location: [facility] SU[facility];  Service: Endoscopy;;  ascending colon  . right VATS (upper lobectomy)  08/29/11   Dr Roxan Hockey  . VIDEO BRONCHOSCOPY WITH ENDOBRONCHIAL NAVIGATION N/A 03/18/2015   Procedure: VIDEO BRONCHOSCOPY WITH ENDOBRONCHIAL NAVIGATION;  Surgeon: [name], MD;  Location: [facility] OR;  Service: Thoracic;  Laterality: N/A;    Allergies: Ibuprofen and Powder  Medications: Prior to Admission medications   Medication Sig Start Date End Date Taking? Authorizing Provider  calcium-vitamin D (OSCAL WITH D) 500-200 MG-UNIT tablet Take 1 tablet by mouth 3 (three) times daily.   Yes [provider]  glucosamine-chondroitin 500-400 MG tablet Take 1 tablet by mouth daily.   Yes [provider]  hydrochlorothiazide (MICROZIDE) 12.5 MG capsule Take 12.5 mg by mouth daily as needed for fluid. 03/05/17  Yes [provider]  meclizine (ANTIVERT) 12.5 MG tablet Take 12.5 mg by mouth 2 (two) times daily as needed for dizziness.   Yes [provider]  metoprolol tartrate (LOPRESSOR) 25 MG tablet Take 25 mg by mouth 2 (two) times daily.  10/23/12  Yes [provider]  Multiple Vitamins-Iron (MULTIVITAMINS WITH IRON) TABS Take 1 tablet by mouth daily.   Yes [provider]  Omega-3 Fatty Acids (FISH OIL) 500 MG CAPS Take 500 mg by mouth daily.    Yes [provider]  vitamin B-12 (CYANOCOBALAMIN) 500 MCG tablet Take 500 mcg by mouth  daily.   Yes [provider]  alendronate (FOSAMAX) 70 MG tablet Take 70 mg by mouth every Monday.  07/06/14   [provider]     Family History  Problem Relation Age of Onset  . Hypertension Mother   . Heart disease Mother   . Heart disease Sister   . Heart disease Brother   . Colon cancer Neg Hx     Social History   Socioeconomic History  . Marital status: Widowed    Spouse name: Not on file  . Number of children: 3  . Years of education: Not on file  . Highest education level: Not on file  Social Needs  . Financial resource strain: Not on file  . Food insecurity - worry: Not on file  . Food insecurity - inability: Not on file  . Transportation needs - medical: Not on file  . Transportation needs - non-medical: Not on file  Occupational History  . Occupation: retired  Tobacco Use  . Smoking status: Former Smoker    Packs/day: 0.30    Years: 20.00    Pack years: 6.00    Types: Cigarettes    Last attempt to quit: 01/12/2014    Years since quitting: 3.5  . Smokeless tobacco: Never Used  Substance and Sexual Activity  . Alcohol use: No  . Drug use: No  . Sexual activity: Not on file  Other Topics Concern  . Not on file  Social History Narrative   Lives w/ cousin    Review of Systems  Constitutional: Negative for fatigue and fever.  Respiratory: Negative for cough and shortness of breath.   Cardiovascular: Negative for chest pain.  Gastrointestinal: Negative for abdominal pain.  Musculoskeletal: Negative for back pain.  Psychiatric/Behavioral: Negative for behavioral problems and confusion.    Vital Signs: BP (!) 146/98   Pulse (!) 49   Temp 97.9 F (36.6 C) (Oral)   Resp 16   Ht 5\' 7"  (1.702 m)   Wt 172 lb (78 kg)   SpO2 100%   BMI 26.94 kg/m   Physical Exam  Constitutional: She is oriented to person, place, and time. She appears well-developed.  Cardiovascular: Normal rate, regular rhythm and normal heart sounds.    Pulmonary/Chest: Effort normal and breath sounds normal. No respiratory distress.  Abdominal: Soft.  Neurological: She is alert and oriented to person, place, and time.  Skin: Skin is warm and dry.  Psychiatric: She has a normal mood and affect. Her behavior is normal. Judgment and thought content normal.  Nursing note and vitals reviewed.   Imaging: Nm Pet Image Initial (pi) Skull Base To Thigh  Result Date: 07/16/2017 CLINICAL DATA:  Subsequent treatment strategy for pulmonary nodule. Lung cancer. RIGHT upper lobectomy. EXAM: NUCLEAR MEDICINE PET SKULL BASE TO THIGH TECHNIQUE: 8.2 mCi F-18 FDG was injected intravenously. Full-ring PET imaging was performed from the skull base to thigh after the radiotracer. CT data was obtained and used for attenuation correction and anatomic localization. FASTING BLOOD GLUCOSE:  Value: 90 mg/dl COMPARISON:  CT 06/04/2017 FINDINGS: NECK No hypermetabolic lymph nodes in the neck. CHEST The enlarging nodule in the medial aspect of  the LEFT upper lobe measures 13 mm and has moderate metabolic activity with SUV max equal 4.6. Small semi-solid nodule in the LEFT upper lobe measuring 7 mm (image 65, series 4) also has faint metabolic activity with SUV max equal 1.4. Ground-glass nodule in the LEFT upper lobe measuring 16 mm (image 55, series 4) has mild metabolic activity with SUV max 2.5 In superior aspect of the RIGHT lower lobe at the surgical margin there is a focus of metabolic activity with SUV max equal 3.3. There is small nodular lesion at this site measuring 8 mm (image 65, series 4). Additional scattered pulmonary nodules without metabolic activity. No hypermetabolic mediastinal lymph nodes. ABDOMEN/PELVIS No abnormal hypermetabolic activity within the liver, pancreas, adrenal glands, or spleen. No hypermetabolic lymph nodes in the abdomen or pelvis. Gallstones noted Uterus normal. Leiomyoma extending from the posterior wall the uterus. Extensive vascular  calcification SKELETON No focal hypermetabolic activity to suggest skeletal metastasis. IMPRESSION: 1. Enlarging nodule of concern in the medial aspect of the LEFT upper lobe is hypermetabolic and most consists with bronchogenic carcinoma. 2. Two additional ground-glass / semi solid nodules in LEFT upper lobe with mild metabolic activity are concerning for adenocarcinoma. 3. Hypermetabolic activity the resection margin at the RIGHT hilum consistent with post therapy inflammation versus local recurrence. Recommend attention on follow-up. 4. No hypermetabolic mediastinal adenopathy or distant disease. Electronically Signed   By: Suzy Bouchard M.D.   On: 07/16/2017 15:49    Labs:  CBC: Recent Labs    07/25/17 0856  WBC 6.7  HGB 12.8  HCT 39.7  PLT 196    COAGS: Recent Labs    07/25/17 0856  INR 1.04  APTT 34    BMP: No results for input(s): NA, K, CL, CO2, GLUCOSE, BUN, CALCIUM, CREATININE, GFRNONAA, GFRAA in the last 8760 hours.  Invalid input(s): CMP  LIVER FUNCTION TESTS: No results for input(s): BILITOT, AST, ALT, ALKPHOS, PROT, ALBUMIN in the last 8760 hours.  TUMOR MARKERS: No results for input(s): AFPTM, CEA, CA199, CHROMGRNA in the last 8760 hours.  Assessment and Plan: Patient with past medical history of right lung cancer s/p partial lung resection in 2013 presents with complaint of enlarging lung nodule in the left upper lobe.  IR consulted for lung nodule biopsy at the request of Dr. Roxan Hockey. Case reviewed by Dr. Anselm Pancoast who approves patient for procedure.  Patient presents today in their usual state of health.  She has been NPO and is not currently on blood thinners. She does have bradycardia today with a HR of 49. She takes metoprolol at home and cannot remember whether she took this today or not.  Discussed with Dr. Kathlene Cote who agrees to proceed with close monitoring of vital signs.   Risks and benefits discussed with the patient including, but not limited  to bleeding, hemoptysis, respiratory failure requiring intubation, infection, pneumothorax requiring chest tube placement, stroke from air embolism or even death. All of the patient's questions were answered, patient is agreeable to proceed. Consent signed and in chart.  Thank you for this interesting consult.  I greatly enjoyed meeting Heather [name] and look forward to participating in their care.  A copy of this report was sent to the requesting provider on this date.  Electronically Signed: Docia Barrier, PA 07/25/2017, 10:36 AM   I spent a total of  30 Minutes   in face to face in clinical consultation, greater than 50% of which was counseling/coordinating care for lung nodule.

## 2017-07-25 NOTE — Sedation Documentation (Signed)
Report called to Shallow R RN in short stay

## 2017-07-25 NOTE — Procedures (Signed)
Interventional Radiology Procedure Note  Procedure: CT guided biopsy of LUL lung mass  Complications: None  Estimated Blood Loss: < 10 mL  Findings: CT guided core biopsy of 1.6 cm LUL lung nodule via 17 G needle. 18 G core biopsy x 2.  Venetia Night. Kathlene Cote, M.D Pager:  [phone]

## 2017-08-01 ENCOUNTER — Telehealth: Payer: Self-pay | Admitting: Internal Medicine

## 2017-08-01 ENCOUNTER — Encounter: Payer: Self-pay | Admitting: Internal Medicine

## 2017-08-01 ENCOUNTER — Encounter: Payer: Self-pay | Admitting: Radiation Oncology

## 2017-08-01 DIAGNOSIS — C3412 Malignant neoplasm of upper lobe, left bronchus or lung: Secondary | ICD-10-CM | POA: Insufficient documentation

## 2017-08-01 DIAGNOSIS — C3411 Malignant neoplasm of upper lobe, right bronchus or lung: Secondary | ICD-10-CM | POA: Diagnosis not present

## 2017-08-01 NOTE — Telephone Encounter (Signed)
Spoke with patient to confirm upcoming appointment with Dr Julien Nordmann, advised patient I would send a calendar and an appointment reminder as well

## 2017-08-01 NOTE — Progress Notes (Signed)
  Radiation Oncology         [phone] ________________________________  Name: Heather [name]  Date: 08/01/2017  DOB: 04/10/1941  Chart Note:  I received a telephone call from Dr. Cline Cools in [place].  This former patient of mine has a new clinical stage IA adenocarcinoma of the left upper lung.  He has requested that I see her in consideration of SBRT.  We will coordinate an evaluation and SBRT simulation and contact the patient.    ________________________________  Sheral Apley. Tammi Klippel, M.D.

## 2017-08-05 NOTE — Progress Notes (Addendum)
Thoracic Location of Tumor / Histology: New clinical stage IA adenocarcinoma of the left upper lung, FUN  Patient presented  months ago with symptoms of:   Biopsies of  (if applicable) revealed: [birth year]    FINDINGS: 07-25-17  Medial and anterior left upper lobe nodule measures approximately 1.1 x 1.6 cm. Solid tissue was obtained. After the procedure a small focus of pleural air was present anteriorly. A chest x-ray will be performed as the patient was experiencing mild left chest discomfort immediately following the procedure.  IMPRESSION: CT-guided core biopsy performed of a medial left upper lobe lung nodule measuring approximately 1.6 cm in greatest diameter.   Tobacco/Marijuana/Snuff/ETOH use: Cigarettes 60 years 1 PPD stopped 6 months ago No drug or alcohol usuage  Past/Anticipated interventions by cardiothoracic surgery, if any:  Dr.  Remo Lipps Henderickson  FINDINGS: 07-25-17 CT Biopsy Left Lung Medial and anterior left upper lobe nodule measures approximately 1.1 x 1.6 cm. Solid tissue was obtained. After the procedure a small focus of pleural air was present anteriorly. A chest x-ray will be performed as the patient was experiencing mild left chest discomfort immediately following the procedure.  IMPRESSION: CT-guided core biopsy performed of a medial left upper lobe lung nodule measuring approximately 1.6 cm in greatest diameter.   IMPRESSION: 07-16-17 PET Image 1. Enlarging nodule of concern in the medial aspect of the LEFT upper lobe is hypermetabolic and most consists with bronchogenic carcinoma. 2. Two additional ground-glass / semi solid nodules in LEFT upper lobe with mild metabolic activity are concerning for adenocarcinoma. 3. Hypermetabolic activity the resection margin at the RIGHT hilum consistent with post therapy inflammation versus local recurrence. Recommend attention on follow-up. 4. No hypermetabolic mediastinal adenopathy or distant  disease.  Past/Anticipated interventions by medical oncology, if any:   Signs/Symptoms  Weight changes, if any: No  Respiratory complaints, if any: Denies SOB,wheezing,coughing yellowish secretions over the past three weeks reports she's had a cold has been treating her self.  Had a flu shot.  Hemoptysis, if any: No except the day she had the biopsy  Pain issues, if any:No    SAFETY ISSUES:No  Prior radiation? : No  Pacemaker/ICD? : No  Possible current pregnancy? : No  Is the patient on methotrexate? : No  Current Complaints / other details:   Wt Readings from Last 3 Encounters:  08/07/17 175 lb 3.2 oz (79.5 kg)  07/25/17 172 lb (78 kg)  07/16/17 172 lb 12.8 oz (78.4 kg)  BP (!) 148/93 (BP Location: Right Arm, Patient Position: Sitting, Cuff Size: Normal)   Pulse (!) 52   Temp 97.8 F (36.6 C) (Oral)   Resp 18   Ht 5\' 7"  (1.702 m)   Wt 175 lb 3.2 oz (79.5 kg)   SpO2 100%   BMI 27.44 kg/m

## 2017-08-07 ENCOUNTER — Encounter: Payer: Self-pay | Admitting: Urology

## 2017-08-07 ENCOUNTER — Ambulatory Visit: Payer: Medicare Other | Admitting: Radiation Oncology

## 2017-08-07 ENCOUNTER — Other Ambulatory Visit: Payer: Self-pay

## 2017-08-07 ENCOUNTER — Encounter: Payer: Self-pay | Admitting: General Practice

## 2017-08-07 ENCOUNTER — Ambulatory Visit
Admission: RE | Admit: 2017-08-07 | Discharge: 2017-08-07 | Disposition: A | Discharge status: Home / Self Care | Payer: Medicare Other | Source: Ambulatory Visit | Attending: Urology | Admitting: Urology

## 2017-08-07 ENCOUNTER — Ambulatory Visit
Admission: RE | Admit: 2017-08-07 | Discharge: 2017-08-07 | Disposition: A | Discharge status: Home / Self Care | Payer: Medicare Other | Source: Ambulatory Visit | Attending: Radiation Oncology | Admitting: Radiation Oncology

## 2017-08-07 VITALS — BP 148/93 | HR 52 | Temp 97.8°F | Resp 18 | Ht 67.0 in | Wt 175.2 lb

## 2017-08-07 DIAGNOSIS — C3412 Malignant neoplasm of upper lobe, left bronchus or lung: Secondary | ICD-10-CM | POA: Insufficient documentation

## 2017-08-07 DIAGNOSIS — Z87891 Personal history of nicotine dependence: Secondary | ICD-10-CM | POA: Insufficient documentation

## 2017-08-07 DIAGNOSIS — Z510 Encounter for antineoplastic radiation therapy: Secondary | ICD-10-CM | POA: Insufficient documentation

## 2017-08-07 DIAGNOSIS — Z85118 Personal history of other malignant neoplasm of bronchus and lung: Secondary | ICD-10-CM | POA: Diagnosis not present

## 2017-08-07 DIAGNOSIS — C3491 Malignant neoplasm of unspecified part of right bronchus or lung: Secondary | ICD-10-CM

## 2017-08-07 DIAGNOSIS — R911 Solitary pulmonary nodule: Secondary | ICD-10-CM

## 2017-08-07 NOTE — Progress Notes (Signed)
Radiation Oncology         [phone] ________________________________  Initial outpatient Consultation  Name: Heather [name] MRN: [id number]  Date of Service: 08/07/2017 DOB: 03/18/1941  HY:WVPXT, Brandon Melnick, MD  Audry Pili, MD   REFERRING PHYSICIAN: Audry Pili, MD  DIAGNOSIS: 77 y.o. female with Stage IA adenocarcinoma of the left upper lobe.      ICD-10-CM   1. Primary adenocarcinoma of upper lobe of left lung [facility]) C34.12 Ambulatory referral to Social Work    Ambulatory referral to Social Work  2. Lung nodule R91.1   3. Non-small cell carcinoma of right lung, stage 1 (HCC) C34.91     HISTORY OF PRESENT ILLNESS: Heather [name] is a 77 y.o. female seen at the request of Dr. Quitman Livings for evaluation of an asymptomatic enlarging left upper lobe nodule.  She has a history of NCSLC, adenocarcinoma, stage Ib in the right upper lobe treated with a right upper lobectomy in 2013.  Pathology showed adenocarcinoma measuring 1.7 cm, grade 2 with negative nodes and margins, pT1bN0M0.  Since that time, she has been followed closely for multiple bilateral pulmonary nodules.  She was initially consulted with Dr. Tammi Klippel in [place] in 2016 for an enlarging groundglass, hazy left upper lobe lesion that had increased from 5 mm in 2013 to 1.3 cm in 2016.  She was offered SBRT at that time but declined treatment and has remained under close observation since that time.  This lesion has continued to be followed, showing minimal growth and has remained predominantly groundglass without significant solid component.  The additional bilateral subcentimeter nodules remained stable as well.  However, surveillance CT chest in November 2018 demonstrated a 1 x 1.6 x 1 cm lesion in the anterior medial left upper lobe that was solid appearing and increased in size from 0.8 x 1.1 x 0.6 cm on prior CT chest from November 2017.  The other scattered pulmonary nodules remained unchanged in the interval.    PET/CT in January 2019 confirmed that the enlarging nodule of concern in the medial aspect of the left upper lobe is indeed hypermetabolic and most consistent with bronchogenic carcinoma.  There were 2 additional groundglass/semisolid nodules in the left upper lobe which demonstrated mild metabolic activity as well as hypermetabolic activity at the resection margin at the right hilum. There was no hypermetabolic mediastinal adenopathy or distant disease.  She underwent a CT-guided biopsy of the left upper lobe lesion on July 25, 2017 confirming adenocarcinoma.  Block has been sent for molecular studies to determine EGFR/ROS/ALK and PDL-1 status to guide further treatment should she have disease progression in the future.  She is referred today to discuss the potential role of radiotherapy, specifically SBRT, in the management of her disease.  PREVIOUS RADIATION THERAPY: No  PAST MEDICAL HISTORY:  Past Medical History:  Diagnosis Date  . Arthritis   . Ascending aortic aneurysm ([place]) 07/20/2014   4.1 cm by CT  . Atrial fib/flutter, transient    post-op  . Constipation    corrects with diet  . Floater, vitreous    Right  . Hypertension   . Nonsquamous nonsmall cell neoplasm of lung ([place]) 09/2011   Stage IB- s/p right upper lobectomy  . Pneumonia   . Psoriasis    Ankles  . Tobacco abuse       PAST SURGICAL HISTORY: Past Surgical History:  Procedure Laterality Date  . CATARACT EXTRACTION Bilateral 2016  . COLONOSCOPY  01/22/2012   Procedure: COLONOSCOPY;  Surgeon: Danie Binder, MD;  [facility];  Service: Endoscopy;  Laterality: N/A;  10:45  . COLONOSCOPY N/A 04/15/2017   Procedure: COLONOSCOPY;  Surgeon: Danie Binder, MD;  [facility];  Service: Endoscopy;  Laterality: N/A;  10:30am  . POLYPECTOMY  04/15/2017   Procedure: POLYPECTOMY;  Surgeon: Danie Binder, MD;  [facility];  Service: Endoscopy;;  ascending colon  . right VATS (upper  lobectomy)  08/29/11   Dr Roxan Hockey  . VIDEO BRONCHOSCOPY WITH ENDOBRONCHIAL NAVIGATION N/A 03/18/2015   Procedure: VIDEO BRONCHOSCOPY WITH ENDOBRONCHIAL NAVIGATION;  Surgeon: [name], MD;  Location: [facility];  Service: Thoracic;  Laterality: N/A;    FAMILY HISTORY:  Family History  Problem Relation Age of Onset  . Hypertension Mother   . Heart disease Mother   . Heart disease Sister   . Heart disease Brother   . Colon cancer Neg Hx     SOCIAL HISTORY:  Social History   Socioeconomic History  . Marital status: Widowed    Spouse name: Not on file  . Number of children: 3  . Years of education: Not on file  . Highest education level: Not on file  Social Needs  . Financial resource strain: Not on file  . Food insecurity - worry: Not on file  . Food insecurity - inability: Not on file  . Transportation needs - medical: Not on file  . Transportation needs - non-medical: Not on file  Occupational History  . Occupation: retired  Tobacco Use  . Smoking status: Former Smoker    Packs/day: 0.30    Years: 20.00    Pack years: 6.00    Types: Cigarettes    Last attempt to quit: 01/12/2014    Years since quitting: 3.5  . Smokeless tobacco: Never Used  Substance and Sexual Activity  . Alcohol use: No  . Drug use: No  . Sexual activity: Not on file  Other Topics Concern  . Not on file  Social History Narrative   Lives w/ cousin    ALLERGIES: Ibuprofen and Powder  MEDICATIONS:  Current Outpatient Medications  Medication Sig Dispense Refill  . alendronate (FOSAMAX) 70 MG tablet Take 70 mg by mouth every Monday.   3  . calcium-vitamin D (OSCAL WITH D) 500-200 MG-UNIT tablet Take 1 tablet by mouth 3 (three) times daily.    Marland Kitchen glucosamine-chondroitin 500-400 MG tablet Take 1 tablet by mouth daily.    . hydrochlorothiazide (MICROZIDE) 12.5 MG capsule Take 12.5 mg by mouth daily as needed for fluid.  3  . metoprolol tartrate (LOPRESSOR) 25 MG tablet Take 25 mg by mouth 2  (two) times daily.     . Multiple Vitamins-Iron (MULTIVITAMINS WITH IRON) TABS Take 1 tablet by mouth daily.    . Omega-3 Fatty Acids (FISH OIL) 500 MG CAPS Take 500 mg by mouth daily.     . vitamin B-12 (CYANOCOBALAMIN) 500 MCG tablet Take 500 mcg by mouth daily.    . meclizine (ANTIVERT) 12.5 MG tablet Take 12.5 mg by mouth 2 (two) times daily as needed for dizziness.     No current facility-administered medications for this encounter.     REVIEW OF SYSTEMS:  On review of systems, the patient reports that she is doing well overall.  She denies any chest pain, shortness of breath, cough, hemoptysis, fevers, chills, night sweats, or unintended weight changes.  She denies any bowel or bladder disturbances, and denies abdominal pain, nausea or  vomiting.  She denies any new musculoskeletal or joint aches or pains. A complete review of systems is obtained and is otherwise negative.    PHYSICAL EXAM:  Wt Readings from Last 3 Encounters:  08/07/17 175 lb 3.2 oz (79.5 kg)  07/25/17 172 lb (78 kg)  07/16/17 172 lb 12.8 oz (78.4 kg)   Temp Readings from Last 3 Encounters:  08/07/17 97.8 F (36.6 C) (Oral)  07/25/17 98.4 F (36.9 C) (Oral)  04/15/17 97.7 F (36.5 C) (Oral)   BP Readings from Last 3 Encounters:  08/07/17 (!) 148/93  07/25/17 (!) 146/80  07/16/17 (!) 141/89   Pulse Readings from Last 3 Encounters:  08/07/17 (!) 52  07/25/17 (!) 49  07/16/17 80   Pain Assessment Pain Score: 0-No pain/10  In general this is a well appearing African American in no acute distress.  She is alert and oriented x4 and appropriate throughout the examination. HEENT reveals that the patient is normocephalic, atraumatic. EOMs are intact. PERRLA. Skin is intact without any evidence of gross lesions. Cardiovascular exam reveals a regular rate and rhythm, no clicks rubs or murmurs are auscultated. Chest is clear to auscultation bilaterally. Lymphatic assessment is performed and does not reveal any  adenopathy in the cervical, supraclavicular, axillary, or inguinal chains. Abdomen has active bowel sounds in all quadrants and is intact. The abdomen is soft, non tender, non distended. Lower extremities are negative for pretibial pitting edema, deep calf tenderness, cyanosis or clubbing.   KPS = 90  100 - Normal; no complaints; no evidence of disease. 90   - Able to carry on normal activity; minor signs or symptoms of disease. 80   - Normal activity with effort; some signs or symptoms of disease. 33   - Cares for self; unable to carry on normal activity or to do active work. 60   - Requires occasional assistance, but is able to care for most of his personal needs. 50   - Requires considerable assistance and frequent medical care. 18   - Disabled; requires special care and assistance. 69   - Severely disabled; hospital admission is indicated although death not imminent. 72   - Very sick; hospital admission necessary; active supportive treatment necessary. 10   - Moribund; fatal processes progressing rapidly. 0     - Dead  Karnofsky DA, Abelmann Prescott, Craver LS and Burchenal [facility] [phone]) The use of the nitrogen mustards in the palliative treatment of carcinoma: with particular reference to bronchogenic carcinoma Cancer 1 634-56  LABORATORY DATA:  Lab Results  Component Value Date   WBC 6.7 07/25/2017   HGB 12.8 07/25/2017   HCT 39.7 07/25/2017   MCV 91.1 07/25/2017   PLT 196 07/25/2017   Lab Results  Component Value Date   NA 138 03/16/2015   K 4.1 03/16/2015   CL 103 03/16/2015   CO2 30 03/16/2015   Lab Results  Component Value Date   ALT 12 (L) 03/16/2015   AST 21 03/16/2015   ALKPHOS 60 03/16/2015   BILITOT 0.7 03/16/2015     RADIOGRAPHY: Nm Pet Image Initial (pi) Skull Base To Thigh  Result Date: 07/16/2017 CLINICAL DATA:  Subsequent treatment strategy for pulmonary nodule. Lung cancer. RIGHT upper lobectomy. EXAM: NUCLEAR MEDICINE PET SKULL BASE TO THIGH TECHNIQUE: 8.2 mCi  F-18 FDG was injected intravenously. Full-ring PET imaging was performed from the skull base to thigh after the radiotracer. CT data was obtained and used for attenuation correction and anatomic localization. FASTING BLOOD GLUCOSE:  Value: 90 mg/dl COMPARISON:  CT 06/04/2017 FINDINGS: NECK No hypermetabolic lymph nodes in the neck. CHEST The enlarging nodule in the medial aspect of the LEFT upper lobe measures 13 mm and has moderate metabolic activity with SUV max equal 4.6. Small semi-solid nodule in the LEFT upper lobe measuring 7 mm (image 65, series 4) also has faint metabolic activity with SUV max equal 1.4. Ground-glass nodule in the LEFT upper lobe measuring 16 mm (image 55, series 4) has mild metabolic activity with SUV max 2.5 In superior aspect of the RIGHT lower lobe at the surgical margin there is a focus of metabolic activity with SUV max equal 3.3. There is small nodular lesion at this site measuring 8 mm (image 65, series 4). Additional scattered pulmonary nodules without metabolic activity. No hypermetabolic mediastinal lymph nodes. ABDOMEN/PELVIS No abnormal hypermetabolic activity within the liver, pancreas, adrenal glands, or spleen. No hypermetabolic lymph nodes in the abdomen or pelvis. Gallstones noted Uterus normal. Leiomyoma extending from the posterior wall the uterus. Extensive vascular calcification SKELETON No focal hypermetabolic activity to suggest skeletal metastasis. IMPRESSION: 1. Enlarging nodule of concern in the medial aspect of the LEFT upper lobe is hypermetabolic and most consists with bronchogenic carcinoma. 2. Two additional ground-glass / semi solid nodules in LEFT upper lobe with mild metabolic activity are concerning for adenocarcinoma. 3. Hypermetabolic activity the resection margin at the RIGHT hilum consistent with post therapy inflammation versus local recurrence. Recommend attention on follow-up. 4. No hypermetabolic mediastinal adenopathy or distant disease.  Electronically Signed   By: Stewart  Edmunds M.D.   On: 07/16/2017 15:49   Ct Biopsy  Result Date: 07/25/2017 CLINICAL DATA:  Enlarging medial left upper lobe lung nodule demonstrating increased metabolic activity by PET scan. This measured approximately 1.6 cm in greatest diameter by PET and demonstrated increased metabolic activity. The patient presents for lung biopsy. EXAM: CT GUIDED CORE BIOPSY OF LEFT LUNG NODULE ANESTHESIA/SEDATION: 2.0 mg IV Versed; 50 mcg IV Fentanyl Total Moderate Sedation Time:  22 minutes. The patient's level of consciousness and physiologic status were continuously monitored during the procedure by Radiology nursing. PROCEDURE: The procedure risks, benefits, and alternatives were explained to the patient. Questions regarding the procedure were encouraged and answered. The patient understands and consents to the procedure. A time-out was performed prior to initiating the procedure. CT was performed through the chest in a supine position. The left anterior chest wall was prepped with chlorhexidine in a sterile fashion, and a sterile drape was applied covering the operative field. A sterile gown and sterile gloves were used for the procedure. Local anesthesia was provided with 1% Lidocaine. Under CT guidance, a 17 gauge trocar needle was advanced to the level of a left upper lobe lung nodule. After confirming needle tip position, 2 separate coaxial 18 gauge core biopsy samples were obtained. The outer needle was removed and additional CT performed. COMPLICATIONS: None FINDINGS: Medial and anterior left upper lobe nodule measures approximately 1.1 x 1.6 cm. Solid tissue was obtained. After the procedure a small focus of pleural air was present anteriorly. A chest x-ray will be performed as the patient was experiencing mild left chest discomfort immediately following the procedure. IMPRESSION: CT-guided core biopsy performed of a medial left upper lobe lung nodule measuring approximately  1.6 cm in greatest diameter. Electronically Signed   By: Glenn  Yamagata M.D.   On: 07/25/2017 17:57   Dg Chest Port 1 View  Result Date: 07/25/2017 CLINICAL DATA:  Left chest pain after biopsy of   a left upper lobe lung nodule. EXAM: PORTABLE CHEST 1 VIEW COMPARISON:  CT scan of the chest of today's date and chest x-ray of March 16, 2015 FINDINGS: The lungs are adequately inflated. There is no pneumothorax or pneumomediastinum. There is no pleural effusion. The interstitial markings of both lungs are coarse though stable. Cardiac silhouette remains enlarged. The pulmonary vascularity is not engorged. There calcification in the wall of the aortic arch. The bony thorax exhibits no acute abnormality. IMPRESSION: No postprocedure pneumothorax or hemothorax following biopsy of the left upper lobe nodule. Thoracic aortic atherosclerosis. Electronically Signed   By: David  Jordan M.D.   On: 07/25/2017 12:27      IMPRESSION/PLAN: 1. 76 y.o. female with newly diagnosed NSCLC, adenocarcinoma, Stage IA of the LUL.   Today, we talked to the patient  about the findings and workup thus far. We discussed the natural history of NSCLC and general treatment, highlighting the role of radiotherapy in the management. We discussed the available radiation techniques, and focused on the details of logistics and delivery.  The recommendation is for SBRT to the left upper lobe lesion over the course of 3-5 treatments.  We reviewed the anticipated acute and late sequelae associated with radiation in this setting. The patient was encouraged to ask questions that were answered to her satisfaction.  She appears to have a good understanding of her disease and treatment recommendations.  At the conclusion of our conversation, the patient elects to proceed with SBRT.  She freely signed written consent to proceed and a copy of this document is placed in her medical record as well as a copy provided to the patient for her own personal  records.  She is scheduled for CT simulation/planning today at 1 PM in anticipation of beginning treatments on August 19, 2017.    Ashlyn W. Bruning, PA-C    Matthew Manning, MD  [place]  Radiation Oncology Direct Dial: [phone]  Fax: [phone] [email or web address]  Skype  [email or web address]    Page Me             

## 2017-08-07 NOTE — Progress Notes (Signed)
[place] Psychosocial Distress Screening Clinical Social Work  Clinical Social Work was referred by distress screening protocol.  The patient scored a 7 on the Psychosocial Distress Thermometer which indicates moderate distress. Clinical Social Worker Edwyna Shell to assess for distress and other psychosocial needs. Unable to reach patient, phone did not have VM connected, CSW will attempt recontact.    Clinical Social Worker follow up needed: Yes.    If yes, follow up plan:  Recontact needed, no answer, no VM.  Edwyna Shell, LCSW Clinical Social Worker Phone:  [phone]

## 2017-08-08 ENCOUNTER — Telehealth: Payer: Self-pay | Admitting: General Practice

## 2017-08-08 ENCOUNTER — Encounter: Payer: Self-pay | Admitting: General Practice

## 2017-08-08 NOTE — Telephone Encounter (Signed)
[place] Psychosocial Distress Screening Clinical Social Work  Clinical Social Work was referred by distress screening protocol.  The patient scored a 7 on the Psychosocial Distress Thermometer which indicates moderate distress. Clinical Social Worker Edwyna Shell to assess for distress and other psychosocial needs.  CSW and patient discussed common feeling and emotions when being diagnosed with cancer, and the importance of support during treatment. CSW informed patient of the support team and support services at [facility]. CSW provided contact information and encouraged patient to call with any questions or concerns.  "I have no problems now, I was excited before I saw the doctor, worried.  Now I am ok."  Lives w son who can provide support in the home.  Uses Medicaid transportation for appointments, no needs at this time.    ONCBCN DISTRESS SCREENING 08/07/2017  Screening Type Change in Status  Distress experienced in past week (1-10) 7    Clinical Social Worker follow up needed: No.  If yes, follow up plan:   Edwyna Shell, LCSW Clinical Social Worker Phone:  [phone]

## 2017-08-08 NOTE — Telephone Encounter (Signed)
[place] Psychosocial Distress Screening Clinical Social Work  Clinical Social Work was referred by distress screening protocol.  The patient scored a 7 on the Psychosocial Distress Thermometer which indicates moderate distress. Clinical Social Worker Edwyna Shell to assess for distress and other psychosocial needs. CSW and patient discussed common feeling and emotions when being diagnosed with cancer, and the importance of support during treatment. CSW informed patient of the support team and support services at [facility]. CSW provided contact information and encouraged patient to call with any questions or concerns.  Per patient, she was "excited" when completing screen, anxious and fearful due to unknown.  After meeting treatment team and having treatment plan established, she is much less worried.  Lives w son who can provide in home support as needed.  Uses Medicaid transport for appointments w no transport needs at this time.  Encouraged patient to contact CSW as needed if needs arise in future.    ONCBCN DISTRESS SCREENING 08/07/2017  Screening Type Change in Status  Distress experienced in past week (1-10) 7    Clinical Social Worker follow up needed:   If yes, follow up plan:   Edwyna Shell, LCSW Clinical Social Worker Phone:  [phone]

## 2017-08-13 DIAGNOSIS — C3412 Malignant neoplasm of upper lobe, left bronchus or lung: Secondary | ICD-10-CM | POA: Diagnosis not present

## 2017-08-13 DIAGNOSIS — Z510 Encounter for antineoplastic radiation therapy: Secondary | ICD-10-CM | POA: Diagnosis not present

## 2017-08-13 DIAGNOSIS — Z87891 Personal history of nicotine dependence: Secondary | ICD-10-CM | POA: Diagnosis not present

## 2017-08-15 DIAGNOSIS — C3491 Malignant neoplasm of unspecified part of right bronchus or lung: Secondary | ICD-10-CM | POA: Diagnosis not present

## 2017-08-16 ENCOUNTER — Other Ambulatory Visit: Payer: Self-pay | Admitting: Medical Oncology

## 2017-08-16 DIAGNOSIS — C3491 Malignant neoplasm of unspecified part of right bronchus or lung: Secondary | ICD-10-CM

## 2017-08-19 ENCOUNTER — Inpatient Hospital Stay: Payer: Medicare Other

## 2017-08-19 ENCOUNTER — Telehealth: Payer: Self-pay

## 2017-08-19 ENCOUNTER — Ambulatory Visit
Admission: RE | Admit: 2017-08-19 | Discharge: 2017-08-19 | Disposition: A | Discharge status: Home / Self Care | Payer: Medicare Other | Source: Ambulatory Visit | Attending: Radiation Oncology | Admitting: Radiation Oncology

## 2017-08-19 ENCOUNTER — Inpatient Hospital Stay: Payer: Medicare Other | Attending: Internal Medicine | Admitting: Internal Medicine

## 2017-08-19 ENCOUNTER — Encounter: Payer: Self-pay | Admitting: Internal Medicine

## 2017-08-19 DIAGNOSIS — Z85118 Personal history of other malignant neoplasm of bronchus and lung: Secondary | ICD-10-CM | POA: Diagnosis not present

## 2017-08-19 DIAGNOSIS — Z79899 Other long term (current) drug therapy: Secondary | ICD-10-CM | POA: Insufficient documentation

## 2017-08-19 DIAGNOSIS — Z87891 Personal history of nicotine dependence: Secondary | ICD-10-CM | POA: Diagnosis not present

## 2017-08-19 DIAGNOSIS — C3490 Malignant neoplasm of unspecified part of unspecified bronchus or lung: Secondary | ICD-10-CM

## 2017-08-19 DIAGNOSIS — Z853 Personal history of malignant neoplasm of breast: Secondary | ICD-10-CM | POA: Insufficient documentation

## 2017-08-19 DIAGNOSIS — Z510 Encounter for antineoplastic radiation therapy: Secondary | ICD-10-CM | POA: Diagnosis not present

## 2017-08-19 DIAGNOSIS — C3412 Malignant neoplasm of upper lobe, left bronchus or lung: Secondary | ICD-10-CM | POA: Diagnosis not present

## 2017-08-19 DIAGNOSIS — C3491 Malignant neoplasm of unspecified part of right bronchus or lung: Secondary | ICD-10-CM

## 2017-08-19 LAB — CBC WITH DIFFERENTIAL (CANCER CENTER ONLY)
BASOS PCT: 0 %
Basophils Absolute: 0 10*3/uL (ref 0.0–0.1)
EOS ABS: 0.1 10*3/uL (ref 0.0–0.5)
Eosinophils Relative: 1 %
HEMATOCRIT: 38.8 % (ref 34.8–46.6)
HEMOGLOBIN: 12.8 g/dL (ref 11.6–15.9)
Lymphocytes Relative: 47 %
Lymphs Abs: 3.4 10*3/uL — ABNORMAL HIGH (ref 0.9–3.3)
MCH: 29.5 pg (ref 25.1–34.0)
MCHC: 32.9 g/dL (ref 31.5–36.0)
MCV: 89.7 fL (ref 79.5–101.0)
MONOS PCT: 8 %
Monocytes Absolute: 0.5 10*3/uL (ref 0.1–0.9)
NEUTROS ABS: 3.2 10*3/uL (ref 1.5–6.5)
NEUTROS PCT: 44 %
Platelet Count: 182 10*3/uL (ref 145–400)
RBC: 4.33 MIL/uL (ref 3.70–5.45)
RDW: 14 % (ref 11.2–14.5)
WBC: 7.3 10*3/uL (ref 3.9–10.3)

## 2017-08-19 LAB — CMP (CANCER CENTER ONLY)
ALK PHOS: 70 U/L (ref 40–150)
ALT: 9 U/L (ref 0–55)
ANION GAP: 9 (ref 3–11)
AST: 20 U/L (ref 5–34)
Albumin: 3.9 g/dL (ref 3.5–5.0)
BUN: 12 mg/dL (ref 7–26)
CALCIUM: 9.2 mg/dL (ref 8.4–10.4)
CHLORIDE: 103 mmol/L (ref 98–109)
CO2: 29 mmol/L (ref 22–29)
CREATININE: 0.99 mg/dL (ref 0.60–1.10)
GFR, EST NON AFRICAN AMERICAN: 54 mL/min — AB (ref >60)
GFR, Est AFR Am: >60 mL/min (ref >60)
Glucose, Bld: 91 mg/dL (ref 70–140)
Potassium: 3.9 mmol/L (ref 3.5–5.1)
SODIUM: 141 mmol/L (ref 136–145)
Total Bilirubin: 1 mg/dL (ref 0.2–1.2)
Total Protein: 7.3 g/dL (ref 6.4–8.3)

## 2017-08-19 NOTE — Telephone Encounter (Signed)
Printed avs and calender for upcoming appointment. Per 2/11 los 

## 2017-08-19 NOTE — Progress Notes (Signed)
[place] Telephone:([phone]) [phone]   Fax:(336) [phone]  CONSULT NOTE  REFERRING PHYSICIAN: Dr. Modesto Charon.  REASON FOR CONSULTATION:  77 years old African-American female with recurrent lung cancer.  HPI Heather [name] is a 77 y.o. female with past medical history significant for hypertension, atrial fibrillation, ascending aortic aneurysm as well as long history for smoking.  The patient was diagnosed with stage IA (T1b, N0, M0) non-small cell lung cancer, invasive moderately differentiated adenocarcinoma invading the bronchial wall status post right upper lobectomy with lymph node dissection with tumor size of 2.2 cm.  The patient was followed by observation under the care of Dr. Roxan Hockey.  CT scan of the chest on 02/01/2015 showed groundglass nodule in the left upper lobe continues to measure several millimeters larger from previous imaging studies.  There was additional bilateral groundglass and sub-solid nodules that were unchanged.  She underwent bronchoscopy with navigational bronchoscopy under the care of Dr. Roxan Hockey on 03/18/2015 and this was not conclusive for malignancy.  She was offered stereotactic radiotherapy at that time but she declined.  Repeat imaging studies including CT scan of the chest on June 04, 2017 showed increase in the size of the solid appearing nodule within the anterior medial left upper lobe which measured 1.0 x 1.6 x 1.0 cm compared to 0.8 x 1.1 x 0.6 cm on previous imaging studies.  A PET scan was performed in July 16, 2017 and it showed enlarging nodule of concern in the medial aspect of the left upper lobe was hypermetabolic consistent with bronchogenic carcinoma.  There was 2 additional groundglass/semisolid nodules in the left upper lobe with mild metabolic activity concerning for adenocarcinoma.  There was also hypermetabolic activity in the resection margin at the right hilum consistent with posttherapy inflammation versus  local recurrence.  There was no hypermetabolic mediastinal adenopathy or distant metastatic disease. On July 25, 2017 the patient underwent CT-guided core biopsy of the left upper lobe lung nodule by interventional radiology and the final pathology ([id number]) was consistent with adenocarcinoma. The patient was referred to Dr. Tammi Klippel for consideration of stereotactic body radiotherapy and she started the first fraction of her treatment today. She was also referred to me today for evaluation and recommendation about any further treatment.  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 When seen today the patient is feeling fine with no specific complaints.  She denied having any chest pain, shortness of breath, cough or hemoptysis.  She denied having any nausea, vomiting, diarrhea or constipation.  She has no fever or chills.  She denied having any headache or visual changes. Family history significant for mother with hypertension and heart disease, father died from electrocution. The patient is a widow and has 2 sons and 1 living daughter.  She has 1 daughter who is deceased from heart disease.  She is to work as a [employer] manager.  She has a history for smoking for around 60 years and quit 6 months ago.  She also has a history of alcohol abuse in the past but not recently.  She has no history of drug abuse. HPI  Past Medical History:  Diagnosis Date  . Arthritis   . Ascending aortic aneurysm ([place]) 07/20/2014   4.1 cm by CT  . Atrial fib/flutter, transient    post-op  . Constipation    corrects with diet  . Floater, vitreous    Right  . Hypertension   .  Nonsquamous nonsmall cell neoplasm of lung ([place]) 09/2011   Stage IB- s/p right upper lobectomy  . Pneumonia   . Psoriasis    Ankles  . Tobacco abuse     Past Surgical History:  Procedure Laterality Date  . CATARACT EXTRACTION Bilateral 2016  . COLONOSCOPY  01/22/2012    Procedure: COLONOSCOPY;  Surgeon: Danie Binder, MD;  Location: [facility];  Service: Endoscopy;  Laterality: N/A;  10:45  . COLONOSCOPY N/A 04/15/2017   Procedure: COLONOSCOPY;  Surgeon: Danie Binder, MD;  Location: [facility];  Service: Endoscopy;  Laterality: N/A;  10:30am  . POLYPECTOMY  04/15/2017   Procedure: POLYPECTOMY;  Surgeon: Danie Binder, MD;  Location: [facility];  Service: Endoscopy;;  ascending colon  . right VATS (upper lobectomy)  08/29/11   Dr Roxan Hockey  . VIDEO BRONCHOSCOPY WITH ENDOBRONCHIAL NAVIGATION N/A 03/18/2015   Procedure: VIDEO BRONCHOSCOPY WITH ENDOBRONCHIAL NAVIGATION;  Surgeon: [name], MD;  Location: [facility] OR;  Service: Thoracic;  Laterality: N/A;    Family History  Problem Relation Age of Onset  . Hypertension Mother   . Heart disease Mother   . Heart disease Sister   . Heart disease Brother   . Colon cancer Neg Hx     Social History Social History   Tobacco Use  . Smoking status: Former Smoker    Packs/day: 0.30    Years: 20.00    Pack years: 6.00    Types: Cigarettes    Last attempt to quit: 01/12/2014    Years since quitting: 3.6  . Smokeless tobacco: Never Used  Substance Use Topics  . Alcohol use: No  . Drug use: No    Allergies  Allergen Reactions  . Ibuprofen Nausea And Vomiting  . Powder Rash    Gloves with powder    Current Outpatient Medications  Medication Sig Dispense Refill  . alendronate (FOSAMAX) 70 MG tablet Take 70 mg by mouth every Monday.   3  . Calcium Carb-Cholecalciferol (CALCIUM-VITAMIN D) 500-200 MG-UNIT tablet Take by mouth.    . calcium-vitamin D (OSCAL WITH D) 500-200 MG-UNIT tablet Take 1 tablet by mouth 3 (three) times daily.    . cyanocobalamin (,VITAMIN B-12,) 1000 MCG/ML injection Take by mouth.    . DOCOSAHEXAENOIC ACID PO Take 500 mg by mouth.    Marland Kitchen glucosamine-chondroitin 500-400 MG tablet Take 1 tablet by mouth daily.    . hydrochlorothiazide (MICROZIDE) 12.5 MG capsule Take  12.5 mg by mouth daily as needed for fluid.  3  . meclizine (ANTIVERT) 12.5 MG tablet Take 12.5 mg by mouth 2 (two) times daily as needed for dizziness.    . metoprolol tartrate (LOPRESSOR) 25 MG tablet Take 25 mg by mouth 2 (two) times daily.     . Multiple Vitamins-Iron (MULTIVITAMINS WITH IRON) TABS Take 1 tablet by mouth daily.    . Omega-3 Fatty Acids (FISH OIL) 500 MG CAPS Take 500 mg by mouth daily.     . vitamin B-12 (CYANOCOBALAMIN) 500 MCG tablet Take 500 mcg by mouth daily.     No current facility-administered medications for this visit.     Review of Systems  Constitutional: positive for fatigue Eyes: negative Ears, nose, mouth, throat, and face: negative Respiratory: negative Cardiovascular: negative Gastrointestinal: negative Genitourinary:negative Integument/breast: negative Hematologic/lymphatic: negative Musculoskeletal:negative Neurological: negative Behavioral/Psych: negative Endocrine: negative Allergic/Immunologic: negative  Physical Exam  JQB:HALPF, healthy, no distress, well nourished and well developed SKIN: skin color, texture, turgor are normal, no rashes  or significant lesions HEAD: Normocephalic, No masses, lesions, tenderness or abnormalities EYES: normal, PERRLA, Conjunctiva are pink and non-injected EARS: External ears normal, Canals clear OROPHARYNX:no exudate, no erythema and lips, buccal mucosa, and tongue normal  NECK: supple, no adenopathy, no JVD LYMPH:  no palpable lymphadenopathy, no hepatosplenomegaly BREAST:not examined LUNGS: clear to auscultation , and palpation HEART: regular rate & rhythm, no murmurs and no gallops ABDOMEN:abdomen soft, non-tender, normal bowel sounds and no masses or organomegaly BACK: No CVA tenderness, Range of motion is normal EXTREMITIES:no joint deformities, effusion, or inflammation, no edema  NEURO: alert & oriented x 3 with fluent speech, no focal motor/sensory deficits  PERFORMANCE STATUS: ECOG  1  LABORATORY DATA: Lab Results  Component Value Date   WBC 7.3 08/19/2017   HGB 12.8 August 14, 2017   HCT 38.8 08/19/2017   MCV 89.7 08/19/2017   PLT 182 08/19/2017      Chemistry      Component Value Date/Time   NA 141 08/19/2017 1212   K 3.9 08/19/2017 1212   CL 103 08/19/2017 1212   CO2 29 08/19/2017 1212   BUN 12 08/19/2017 1212   CREATININE 0.99 08/19/2017 1212      Component Value Date/Time   CALCIUM 9.2 08/19/2017 1212   ALKPHOS 70 08/19/2017 1212   AST 20 08/19/2017 1212   ALT 9 08/19/2017 1212   BILITOT 1.0 08/19/2017 1212       RADIOGRAPHIC STUDIES: Ct Biopsy  Result Date: Aug 14, 2017 CLINICAL DATA:  Enlarging medial left upper lobe lung nodule demonstrating increased metabolic activity by PET scan. This measured approximately 1.6 cm in greatest diameter by PET and demonstrated increased metabolic activity. The patient presents for lung biopsy. EXAM: CT GUIDED CORE BIOPSY OF LEFT LUNG NODULE ANESTHESIA/SEDATION: 2.0 mg IV Versed; 50 mcg IV Fentanyl Total Moderate Sedation Time:  22 minutes. The patient's level of consciousness and physiologic status were continuously monitored during the procedure by Radiology nursing. PROCEDURE: The procedure risks, benefits, and alternatives were explained to the patient. Questions regarding the procedure were encouraged and answered. The patient understands and consents to the procedure. A time-out was performed prior to initiating the procedure. CT was performed through the chest in a supine position. The left anterior chest wall was prepped with chlorhexidine in a sterile fashion, and a sterile drape was applied covering the operative field. A sterile gown and sterile gloves were used for the procedure. Local anesthesia was provided with 1% Lidocaine. Under CT guidance, a 17 gauge trocar needle was advanced to the level of a left upper lobe lung nodule. After confirming needle tip position, 2 separate coaxial 18 gauge core biopsy samples  were obtained. The outer needle was removed and additional CT performed. COMPLICATIONS: None FINDINGS: Medial and anterior left upper lobe nodule measures approximately 1.1 x 1.6 cm. Solid tissue was obtained. After the procedure a small focus of pleural air was present anteriorly. A chest x-ray will be performed as the patient was experiencing mild left chest discomfort immediately following the procedure. IMPRESSION: CT-guided core biopsy performed of a medial left upper lobe lung nodule measuring approximately 1.6 cm in greatest diameter. Electronically Signed   By: Aletta Edouard M.D.   On: 14-Aug-2017 17:57   Dg Chest Port 1 View  Result Date: August 14, 2017 CLINICAL DATA:  Left chest pain after biopsy of a left upper lobe lung nodule. EXAM: PORTABLE CHEST 1 VIEW COMPARISON:  CT scan of the chest of today's date and chest x-ray of March 16, 2015 FINDINGS:  The lungs are adequately inflated. There is no pneumothorax or pneumomediastinum. There is no pleural effusion. The interstitial markings of both lungs are coarse though stable. Cardiac silhouette remains enlarged. The pulmonary vascularity is not engorged. There calcification in the wall of the aortic arch. The bony thorax exhibits no acute abnormality. IMPRESSION: No postprocedure pneumothorax or hemothorax following biopsy of the left upper lobe nodule. Thoracic aortic atherosclerosis. Electronically Signed   By: David  Martinique M.D.   On: 07/25/2017 12:27    ASSESSMENT: This is a very pleasant 77 years old African-American female with history of a stage IA non-small cell lung cancer status post right upper lobectomy with lymph node dissection on September 24, 2011 and has been in observation for several years.  She now has recurrent disease in the left upper lobe suspicious for stage IA.  She also has some other small pulmonary nodules that could be low-grade adenocarcinoma.  PLAN: I had a lengthy discussion with the patient today about her current  disease of stage, prognosis and treatment options. I strongly recommended for the patient to continue her current treatment with SBRT to the left upper lobe biopsy-proven adenocarcinoma. Her molecular studies showed no actionable mutation except for MEK1 deletion.  There is no currently FDA approved lung cancer treatment for this mutation but this could be used in the future if needed. PDL 1 expression was 10%. I recommended for the patient to continue on observation after completion of her stereotactic radiotherapy. I will see her back for follow-up visit in 6 months for evaluation after repeating CT scan of the chest for restaging of her disease. She was advised to call immediately if she has any concerning symptoms in the event. The patient voices understanding of current disease status and treatment options and is in agreement with the current care plan.  All questions were answered. The patient knows to call the clinic with any problems, questions or concerns. We can certainly see the patient much sooner if necessary.  Thank you so much for allowing me to participate in the care of Heather [name]. I will continue to follow up the patient with you and assist in her care.  I spent 40 minutes counseling the patient face to face. The total time spent in the appointment was 60 minutes.  Disclaimer: This note was dictated with voice recognition software. Similar sounding words can inadvertently be transcribed and may not be corrected upon review.   Heather [name] August 19, 2017, 1:43 PM

## 2017-08-20 ENCOUNTER — Ambulatory Visit: Payer: Medicare Other | Admitting: Radiation Oncology

## 2017-08-21 ENCOUNTER — Ambulatory Visit
Admission: RE | Admit: 2017-08-21 | Discharge: 2017-08-21 | Disposition: A | Discharge status: Home / Self Care | Payer: Medicare Other | Source: Ambulatory Visit | Attending: Radiation Oncology | Admitting: Radiation Oncology

## 2017-08-21 ENCOUNTER — Encounter (HOSPITAL_COMMUNITY): Payer: Self-pay

## 2017-08-21 DIAGNOSIS — Z87891 Personal history of nicotine dependence: Secondary | ICD-10-CM | POA: Diagnosis not present

## 2017-08-21 DIAGNOSIS — C3412 Malignant neoplasm of upper lobe, left bronchus or lung: Secondary | ICD-10-CM | POA: Diagnosis not present

## 2017-08-21 DIAGNOSIS — Z510 Encounter for antineoplastic radiation therapy: Secondary | ICD-10-CM | POA: Diagnosis not present

## 2017-08-22 ENCOUNTER — Ambulatory Visit: Payer: Medicare Other | Admitting: Radiation Oncology

## 2017-08-23 ENCOUNTER — Ambulatory Visit
Admission: RE | Admit: 2017-08-23 | Discharge: 2017-08-23 | Disposition: A | Discharge status: Home / Self Care | Payer: Medicare Other | Source: Ambulatory Visit | Attending: Radiation Oncology | Admitting: Radiation Oncology

## 2017-08-23 DIAGNOSIS — Z510 Encounter for antineoplastic radiation therapy: Secondary | ICD-10-CM | POA: Diagnosis not present

## 2017-08-23 DIAGNOSIS — Z87891 Personal history of nicotine dependence: Secondary | ICD-10-CM | POA: Diagnosis not present

## 2017-08-23 DIAGNOSIS — C3412 Malignant neoplasm of upper lobe, left bronchus or lung: Secondary | ICD-10-CM | POA: Diagnosis not present

## 2017-08-27 ENCOUNTER — Ambulatory Visit
Admission: RE | Admit: 2017-08-27 | Discharge: 2017-08-27 | Disposition: A | Discharge status: Home / Self Care | Payer: Medicare Other | Source: Ambulatory Visit | Attending: Radiation Oncology | Admitting: Radiation Oncology

## 2017-08-27 DIAGNOSIS — Z510 Encounter for antineoplastic radiation therapy: Secondary | ICD-10-CM | POA: Diagnosis not present

## 2017-08-27 DIAGNOSIS — Z87891 Personal history of nicotine dependence: Secondary | ICD-10-CM | POA: Diagnosis not present

## 2017-08-27 DIAGNOSIS — C3412 Malignant neoplasm of upper lobe, left bronchus or lung: Secondary | ICD-10-CM | POA: Diagnosis not present

## 2017-08-29 ENCOUNTER — Ambulatory Visit
Admission: RE | Admit: 2017-08-29 | Discharge: 2017-08-29 | Disposition: A | Discharge status: Home / Self Care | Payer: Medicare Other | Source: Ambulatory Visit | Attending: Radiation Oncology | Admitting: Radiation Oncology

## 2017-08-29 DIAGNOSIS — Z510 Encounter for antineoplastic radiation therapy: Secondary | ICD-10-CM | POA: Diagnosis not present

## 2017-08-29 DIAGNOSIS — Z87891 Personal history of nicotine dependence: Secondary | ICD-10-CM | POA: Diagnosis not present

## 2017-08-29 DIAGNOSIS — C3412 Malignant neoplasm of upper lobe, left bronchus or lung: Secondary | ICD-10-CM | POA: Diagnosis not present

## 2017-08-30 ENCOUNTER — Encounter: Payer: Self-pay | Admitting: Urology

## 2017-08-30 NOTE — Progress Notes (Signed)
  Radiation Oncology         [phone] ________________________________  Name: Heather [name] MRN: [id number]  Date: 08/30/2017  DOB: [birth year]  End of Treatment Note  Diagnosis:   77 y.o. female with Stage IA adenocarcinoma of the left upper lobe.      Indication for treatment:  Curative, Definitive SBRT       Radiation treatment dates:   2/11, 2/13, 2/15, 2/19 & 08/29/17  Site/dose:   The LUL target was treated to 50 Gy in 5 fractions of 10 Gy  Beams/energy:   The patient was treated using stereotactic body radiotherapy according to a 3D conformal radiotherapy plan.  Volumetric arc fields were employed to deliver 6 MV X-rays.  Image guidance was performed with per fraction cone beam CT prior to treatment under personal MD supervision.  Immobilization was achieved using BodyFix Pillow.  Narrative: The patient tolerated radiation treatment relatively well.   She experienced mild fatigue but otherwise tolerated radiotherapy very well without any significant adverse side effects.  She did develop a mild, nonproductive cough which she attributed to a head cold with sinus drainage.  She denied hemoptysis, shortness of breath, chest pain or dysphagia.  She did not experience any skin irritation.  Plan: The patient has completed radiation treatment. The patient will return to radiation oncology clinic for routine followup in one month. I advised them to call or return sooner if they have any questions or concerns related to their recovery or treatment. ________________________________  Sheral Apley. Tammi Klippel, M.D.

## 2017-09-26 ENCOUNTER — Ambulatory Visit
Admission: RE | Admit: 2017-09-26 | Discharge: 2017-09-26 | Disposition: A | Discharge status: Home / Self Care | Payer: Medicare Other | Source: Ambulatory Visit | Attending: Urology | Admitting: Urology

## 2017-09-26 ENCOUNTER — Telehealth: Payer: Self-pay | Admitting: Urology

## 2017-09-26 ENCOUNTER — Encounter: Payer: Self-pay | Admitting: *Deleted

## 2017-09-26 DIAGNOSIS — C3412 Malignant neoplasm of upper lobe, left bronchus or lung: Secondary | ICD-10-CM

## 2017-09-26 NOTE — Telephone Encounter (Signed)
Patient was unable to make her scheduled 1 month f/u visit today due to transportation issues.  I called and spoke with her by phone and she reports that she is doing well overall and recovering in standard fashion.  She specifically denies chest pain, dysphagia, increased shortness of breath, productive cough, hemoptysis, fever or chills.  She continues with moderate fatigue but is otherwise doing well. She denies abdominal pain, N/V or diarrhea and is eating a healthy diet to maintain her weight. I advised that we will schedule a repeat Chest CT in 2-3 weeks to assess treatment response and we will plan to meet back for a follow up visit thereafter to review those results.  She is in agreement with and is comfortable with this plan.   Nicholos Johns, PA-C

## 2017-09-26 NOTE — Progress Notes (Signed)
66 Called spoke with Mrs. Pouliot she" stated I have tried to call to let you know I could not come today for my appointment because I don't have any transportation.  I have to make arrangement for transportation a few days ahead of time for my appointments  I am sorry.  I let her know we would reschedule her appointment and I would make Ashlyn Bruning, PA-C aware of her situation. [place], PA-C made aware of the above situation and she plans to call and speak with Mrs. Mcnair and have her rescheule her appointment after her imaging.

## 2017-10-08 NOTE — Progress Notes (Deleted)
Heather [name] 77 y.o.female withStage IA adenocarcinoma of theleft upper lobe completed radiation 08-29-17,review  04-04-19CT chest w contrast , FU.   Weight changes, if any: Wt Readings from Last 3 Encounters:  10/11/17 184 lb 6.4 oz (83.6 kg)  08/19/17 175 lb 8 oz (79.6 kg)  08/07/17 175 lb 3.2 oz (79.5 kg)   Respiratory complaints, if any: Denies SOB,coughing clear secretions had a cold for three weeks,no wheezing Hemoptysis, if any: No  Swallowing Problems/Pain/Difficulty swallowing:No Appetite :Goo Pain:No 08-10-09-19 Saw Dr. Julien Nordmann BP (!) 143/82 (BP Location: Right Arm, Patient Position: Sitting, Cuff Size: Normal)   Pulse (!) 54   Temp 97.8 F (36.6 C) (Oral)   Resp 18   Ht 5\' 7"  (1.702 m)   Wt 184 lb 6.4 oz (83.6 kg)   SpO2 100%   BMI 28.88 kg/m

## 2017-10-10 ENCOUNTER — Ambulatory Visit (HOSPITAL_COMMUNITY)
Admission: RE | Admit: 2017-10-10 | Discharge: 2017-10-10 | Disposition: A | Discharge status: Home / Self Care | Payer: Medicare Other | Source: Ambulatory Visit | Attending: Urology | Admitting: Urology

## 2017-10-10 DIAGNOSIS — I700 Atherosclerosis of aorta: Secondary | ICD-10-CM | POA: Diagnosis not present

## 2017-10-10 DIAGNOSIS — C3412 Malignant neoplasm of upper lobe, left bronchus or lung: Secondary | ICD-10-CM | POA: Diagnosis not present

## 2017-10-10 DIAGNOSIS — R918 Other nonspecific abnormal finding of lung field: Secondary | ICD-10-CM | POA: Diagnosis not present

## 2017-10-10 DIAGNOSIS — J439 Emphysema, unspecified: Secondary | ICD-10-CM | POA: Insufficient documentation

## 2017-10-10 DIAGNOSIS — R911 Solitary pulmonary nodule: Secondary | ICD-10-CM | POA: Diagnosis not present

## 2017-10-10 LAB — POCT I-STAT CREATININE: Creatinine, Ser: 1.1 mg/dL — ABNORMAL HIGH (ref 0.44–1.00)

## 2017-10-10 MED ORDER — IOPAMIDOL (ISOVUE-300) INJECTION 61%
INTRAVENOUS | Status: AC
  Filled 2017-10-10: qty 75

## 2017-10-10 MED ORDER — IOPAMIDOL (ISOVUE-300) INJECTION 61%
75.0000 mL | Freq: Once | INTRAVENOUS | Status: AC | PRN
Start: 2017-10-10 — End: 2017-10-10
  Administered 2017-10-10: 75 mL via INTRAVENOUS

## 2017-10-11 ENCOUNTER — Ambulatory Visit
Admission: RE | Admit: 2017-10-11 | Discharge: 2017-10-11 | Disposition: A | Discharge status: Home / Self Care | Payer: Medicare Other | Source: Ambulatory Visit | Attending: Urology | Admitting: Urology

## 2017-10-11 ENCOUNTER — Encounter: Payer: Self-pay | Admitting: Urology

## 2017-10-11 ENCOUNTER — Other Ambulatory Visit: Payer: Self-pay

## 2017-10-11 VITALS — BP 143/82 | HR 54 | Temp 97.8°F | Resp 18 | Ht 67.0 in | Wt 184.4 lb

## 2017-10-11 DIAGNOSIS — Z886 Allergy status to analgesic agent status: Secondary | ICD-10-CM | POA: Diagnosis not present

## 2017-10-11 DIAGNOSIS — Z923 Personal history of irradiation: Secondary | ICD-10-CM | POA: Diagnosis not present

## 2017-10-11 DIAGNOSIS — C3412 Malignant neoplasm of upper lobe, left bronchus or lung: Secondary | ICD-10-CM | POA: Insufficient documentation

## 2017-10-11 DIAGNOSIS — Z7983 Long term (current) use of bisphosphonates: Secondary | ICD-10-CM | POA: Insufficient documentation

## 2017-10-11 DIAGNOSIS — R918 Other nonspecific abnormal finding of lung field: Secondary | ICD-10-CM | POA: Insufficient documentation

## 2017-10-11 DIAGNOSIS — Z79899 Other long term (current) drug therapy: Secondary | ICD-10-CM | POA: Diagnosis not present

## 2017-10-11 DIAGNOSIS — Z85118 Personal history of other malignant neoplasm of bronchus and lung: Secondary | ICD-10-CM | POA: Insufficient documentation

## 2017-10-11 DIAGNOSIS — Z902 Acquired absence of lung [part of]: Secondary | ICD-10-CM | POA: Insufficient documentation

## 2017-10-11 NOTE — Progress Notes (Signed)
75 Called spoke with Mrs. Mertens she" stated I have tried to call to let you know I could not come today for my appointment because I don't have any transportation.  I have to make arrangement for transportation a few days ahead of time for my appointments  I am sorry.  I let her know we would reschedule her appointment and I would make Ashlyn Bruning, PA-C aware of her situation. [place], PA-C made aware of the above situation and she plans to call and speak with Mrs. Mcdougald and have her rescheule her appointment after her imaging.

## 2017-10-11 NOTE — Progress Notes (Signed)
Radiation Oncology         [phone] ________________________________  Name: Heather [name] MRN: [id number]  Date: 10/11/2017  DOB: Aug 27, 1940  Post Treatment Note  CC: Rosita Fire, MD  Rosita Fire, MD  Diagnosis:   77 y.o.female withStage IA adenocarcinoma of theleft upper lobe.    Interval Since Last Radiation:  6 weeks  SBRT on 2/11, 2/13, 2/15, 2/19 & 08/29/17:  The LUL target was treated to 50 Gy in 5 fractions of 10 Gy  Narrative:  The patient returns today for routine follow-up.  She tolerated radiation treatment relatively well.   She experienced mild fatigue but otherwise tolerated radiotherapy very well without any significant adverse side effects.  She did develop a mild, nonproductive cough which she attributed to a head cold with sinus drainage.  She denied hemoptysis, shortness of breath, chest pain or dysphagia.  She did not experience any skin irritation.    In summary, she has a history of NCSLC, adenocarcinoma, Stage Ib in the right upper lobe treated with a right upper lobectomy in 2013.  Pathology showed adenocarcinoma measuring 1.7 cm, grade 2 with negative nodes and margins, pT1bN0M0.  Since that time, she has been followed closely for multiple bilateral pulmonary nodules.  She was initially consulted with Dr. Tammi Klippel in [place] in 2016 for an enlarging groundglass, hazy left upper lobe lesion that had increased from 5 mm in 2013 to 1.3 cm in 2016.  She was offered SBRT at that time but declined treatment and has remained under close observation since that time.  This lesion has continued to be followed, showing minimal growth and has remained predominantly groundglass without significant solid component.  The additional bilateral subcentimeter nodules remained stable as well.  However, surveillance CT chest in November 2018 demonstrated a 1 x 1.6 x 1 cm lesion in the anterior medial left upper lobe that was solid appearing and increased in size from 0.8 x 1.1 x 0.6  cm on prior CT chest from November 2017.  The other scattered pulmonary nodules remained unchanged in the interval.   PET/CT in January 2019 confirmed that the enlarging nodule of concern in the medial aspect of the left upper lobe was hypermetabolic and most consistent with bronchogenic carcinoma.  There were 2 additional groundglass/semisolid nodules in the left upper lobe which demonstrated mild metabolic activity as well as hypermetabolic activity at the resection margin at the right hilum. There was no hypermetabolic mediastinal adenopathy or distant disease.  She underwent a CT-guided biopsy of the left upper lobe lesion on July 25, 2017 confirming adenocarcinoma.  Molecular studies showed no actionable mutation except for MEK1 deletion and PDL 1 expression was 10%.                       On review of systems, the patient states that she is doing well overall.  She specifically denies chest pain, increased shortness of breath, hemoptysis, fever or chills.  She reports a mild cough with clear secretions which she thinks is secondary to allergy.  She denies any dysphagia or pain with swallowing. She reports a healthy appetite and has maintained her weight.  Her energy level has remained stable throughout.  CT chest imaging performed 10/10/2017 reveals disease stability without evidence of disease progression or recurrence. She has a follow up visit scheduled with Dr. Julien Nordmann on 03/20/18 following repeat systemic imaging for disease restaging.  She is currently on observation as her molecular studies showed no actionable  mutation except for MEK1 deletion and there is no currently FDA approved lung cancer treatment for this mutation but this could be used in the future if needed. PDL 1 expression was 10%.  She has additional small pulmonary nodules in the left upper lobe that appear groundglass/semi-solid and are hypermetabolic on PET imaging.  These will be followed closely on subsequent  imaging.   ALLERGIES:  is allergic to ibuprofen and powder.  Meds: Current Outpatient Medications  Medication Sig Dispense Refill  . alendronate (FOSAMAX) 70 MG tablet Take 70 mg by mouth every Monday.   3  . Calcium Carb-Cholecalciferol (CALCIUM-VITAMIN D) 500-200 MG-UNIT tablet Take by mouth.    . calcium-vitamin D (OSCAL WITH D) 500-200 MG-UNIT tablet Take 1 tablet by mouth 3 (three) times daily.    . cyanocobalamin (,VITAMIN B-12,) 1000 MCG/ML injection Take by mouth.    . DOCOSAHEXAENOIC ACID PO Take 500 mg by mouth.    Marland Kitchen glucosamine-chondroitin 500-400 MG tablet Take 1 tablet by mouth daily.    . hydrochlorothiazide (MICROZIDE) 12.5 MG capsule Take 12.5 mg by mouth daily as needed for fluid.  3  . meclizine (ANTIVERT) 12.5 MG tablet Take 12.5 mg by mouth 2 (two) times daily as needed for dizziness.    . metoprolol tartrate (LOPRESSOR) 25 MG tablet Take 25 mg by mouth 2 (two) times daily.     . Multiple Vitamins-Iron (MULTIVITAMINS WITH IRON) TABS Take 1 tablet by mouth daily.    . Omega-3 Fatty Acids (FISH OIL) 500 MG CAPS Take 500 mg by mouth daily.     . vitamin B-12 (CYANOCOBALAMIN) 500 MCG tablet Take 500 mcg by mouth daily.     No current facility-administered medications for this encounter.     Physical Findings:  height is 5\' 7"  (1.702 m) and weight is 184 lb 6.4 oz (83.6 kg). Her oral temperature is 97.8 F (36.6 C). Her blood pressure is 143/82 (abnormal) and her pulse is 54 (abnormal). Her respiration is 18 and oxygen saturation is 100%.  Pain Assessment Pain Score: 0-No pain/10 In general this is a well appearing African-American female in no acute distress.  She's alert and oriented x4 and appropriate throughout the examination. Cardiopulmonary assessment is negative for acute distress and she exhibits normal effort.   Lab Findings: Lab Results  Component Value Date   WBC 7.3 08/19/2017   HGB 12.8 07/25/2017   HCT 38.8 08/19/2017   MCV 89.7 08/19/2017   PLT 182  08/19/2017     Radiographic Findings: Ct Chest W Contrast  Result Date: 10/10/2017 CLINICAL DATA:  Patient with history of left upper lobe adenocarcinoma. EXAM: CT CHEST WITH CONTRAST TECHNIQUE: Multidetector CT imaging of the chest was performed during intravenous contrast administration. CONTRAST:  34mL ISOVUE-300 IOPAMIDOL (ISOVUE-300) INJECTION 61% COMPARISON:  PET-CT 07/16/2017 FINDINGS: Cardiovascular: Heart is enlarged. No pericardial effusion. Thoracic aortic vascular calcifications. Mediastinum/Nodes: No enlarged axillary, mediastinal or hilar lymphadenopathy. Esophagus is normal in appearance. Lungs/Pleura: Central airways are patent. Similar appearing 1.8 x 1.0 cm left lower lobe sub solid nodule (image 90; series 5). Similar-appearing 9 mm left lower lobe nodule (image 63; series 5). Similar 2.3 x 1.4 cm ground-glass nodule left upper lobe (image 25; series 5). Similar-appearing 1.3 x 1.7 cm irregular nodule medial left upper lobe (image 59; series 5). Similar-appearing 8 mm ground-glass nodule right upper lobe (image 33; series 5). Multiple additional bilateral nodules are similar in appearance when compared to prior, predominately ground-glass in attenuation. No pleural effusion or pneumothorax.  Upper Abdomen: No acute process. Musculoskeletal: Thoracic spine degenerative changes. No aggressive or acute appearing osseous lesions. IMPRESSION: 1. Similar-appearing irregular nodule within the medial left upper lobe. 2. Grossly unchanged bilateral predominately ground-glass pulmonary nodules. 3. Aortic Atherosclerosis (ICD10-I70.0) and Emphysema (ICD10-J43.9). Electronically Signed   By: Lovey Newcomer M.D.   On: 10/10/2017 15:54    Impression/Plan: 1. 77 y.o.female withStage IA adenocarcinoma of theleft upper lobe.   The patient appears to have recovered well from the effects of radiotherapy.  Her recent CT chest imaging performed 10/10/2017 reveals disease stability without evidence of disease  progression or recurrence.  She has a follow up visit scheduled with Dr. Julien Nordmann on 03/20/18 following repeat systemic imaging for disease restaging.  She is currently on observation as her molecular studies showed no actionable mutation except for MEK1 deletion and there is no currently FDA approved lung cancer treatment for this mutation but this could be used in the future if needed. PDL 1 expression was 10%.  She has additional small pulmonary nodules in the left upper lobe that appear groundglass/semi-solid and are hypermetabolic on PET imaging.  These will be followed closely on subsequent imaging.  We discussed that while we are happy to continue to participate in her care if clinically indicated, at this point, we will plan to see her back on an as-needed basis.  She will continue in routine follow-up under the care and direction of Dr. Julien Nordmann.  She knows to call at anytime with any questions or concerns related to her previous radiotherapy.    Nicholos Johns, PA-C

## 2017-10-11 NOTE — Progress Notes (Addendum)
Heather [name] 77 y.o.female withStage IA adenocarcinoma of theleft upper lobe completed radiation 08-29-17,review  04-04-19CT chest w contrast , FU.   Weight changes, if any: Wt Readings from Last 3 Encounters:  10/11/17 184 lb 6.4 oz (83.6 kg)  08/19/17 175 lb 8 oz (79.6 kg)  08/07/17 175 lb 3.2 oz (79.5 kg)   Respiratory complaints, if any: Denies SOB,coughing clear secretions had a cold for three weeks,no wheezing, denies fatigue. Hemoptysis, if any: No  Swallowing Problems/Pain/Difficulty swallowing:No Appetite :Good Pain:No 08-10-09-19 Saw Dr. Julien Nordmann BP (!) 143/82 (BP Location: Right Arm, Patient Position: Sitting, Cuff Size: Normal)   Pulse (!) 54   Temp 97.8 F (36.6 C) (Oral)   Resp 18   Ht 5\' 7"  (1.702 m)   Wt 184 lb 6.4 oz (83.6 kg)   SpO2 100%   BMI 28.88 kg/m

## 2017-10-18 DIAGNOSIS — J449 Chronic obstructive pulmonary disease, unspecified: Secondary | ICD-10-CM | POA: Diagnosis not present

## 2017-10-18 DIAGNOSIS — C3490 Malignant neoplasm of unspecified part of unspecified bronchus or lung: Secondary | ICD-10-CM | POA: Diagnosis not present

## 2017-10-18 DIAGNOSIS — R42 Dizziness and giddiness: Secondary | ICD-10-CM | POA: Diagnosis not present

## 2017-10-18 DIAGNOSIS — Z1331 Encounter for screening for depression: Secondary | ICD-10-CM | POA: Diagnosis not present

## 2017-10-18 DIAGNOSIS — Z1389 Encounter for screening for other disorder: Secondary | ICD-10-CM | POA: Diagnosis not present

## 2017-10-18 DIAGNOSIS — M810 Age-related osteoporosis without current pathological fracture: Secondary | ICD-10-CM | POA: Diagnosis not present

## 2017-10-18 DIAGNOSIS — I10 Essential (primary) hypertension: Secondary | ICD-10-CM | POA: Diagnosis not present

## 2017-10-18 DIAGNOSIS — J189 Pneumonia, unspecified organism: Secondary | ICD-10-CM | POA: Diagnosis not present

## 2017-10-18 DIAGNOSIS — R739 Hyperglycemia, unspecified: Secondary | ICD-10-CM | POA: Diagnosis not present

## 2017-10-18 DIAGNOSIS — R222 Localized swelling, mass and lump, trunk: Secondary | ICD-10-CM | POA: Diagnosis not present

## 2017-11-18 ENCOUNTER — Ambulatory Visit (INDEPENDENT_AMBULATORY_CARE_PROVIDER_SITE_OTHER): Payer: Medicare Other | Admitting: Cardiovascular Disease

## 2017-11-18 ENCOUNTER — Encounter: Payer: Self-pay | Admitting: Cardiovascular Disease

## 2017-11-18 VITALS — BP 140/80 | HR 56 | Ht 67.0 in | Wt 186.0 lb

## 2017-11-18 DIAGNOSIS — I10 Essential (primary) hypertension: Secondary | ICD-10-CM | POA: Diagnosis not present

## 2017-11-18 DIAGNOSIS — I480 Paroxysmal atrial fibrillation: Secondary | ICD-10-CM | POA: Diagnosis not present

## 2017-11-18 DIAGNOSIS — I5022 Chronic systolic (congestive) heart failure: Secondary | ICD-10-CM

## 2017-11-18 DIAGNOSIS — I428 Other cardiomyopathies: Secondary | ICD-10-CM | POA: Diagnosis not present

## 2017-11-18 MED ORDER — METOPROLOL SUCCINATE ER 25 MG PO TB24: 25.0000 mg | ORAL_TABLET | Freq: Two times a day (BID) | ORAL | 6 refills | Status: DC

## 2017-11-18 NOTE — Patient Instructions (Signed)
Medication Instructions:   Stop Lopressor.   Begin Toprol XL 25mg  twice a day.  Continue all other medications.    Labwork: none  Testing/Procedures:  Your physician has requested that you have an echocardiogram. Echocardiography is a painless test that uses sound waves to create images of your heart. It provides your doctor with information about the size and shape of your heart and how well your heart's chambers and valves are working. This procedure takes approximately one hour. There are no restrictions for this procedure.  Office will contact with results via phone or letter.    Follow-Up: 3 months   Any Other Special Instructions Will Be Listed Below (If Applicable).  If you need a refill on your cardiac medications before your next appointment, please call your pharmacy.

## 2017-11-18 NOTE — Progress Notes (Signed)
CARDIOLOGY CONSULT NOTE  Patient ID: Heather [name] MRN: [id number] DOB/AGE: [birth year] 77 y.o.  Admit date: (Not on file) Primary Physician: Rosita Fire, MD Referring Physician: Rosita Fire, MD  Reason for Consultation: Cardiomegaly  HPI: Heather [name] is a 77 y.o. female who is being seen today for the evaluation of cardiomegaly at the request of Rosita Fire, MD.   Past medical history includes hypertension, paroxysmal atrial fibrillation, chronic systolic heart failure, and left upper lobe lung adenocarcinoma.  I reviewed a chest CT performed on 10/10/2017 which demonstrated cardiac enlargement.  There were thoracic aortic vascular calcifications.  It also showed an irregular nodule within the medial left upper lobe and unchanged bilateral predominantly groundglass pulmonary nodules.  I reviewed an echocardiogram performed on 03/18/2015 which showed moderate to severely reduced left ventricular systolic function, LVEF 30 to 35%.  There was mild mitral regurgitation, moderate left atrial and mild to moderate right atrial enlargement.  I reviewed another echocardiogram performed on 03/14/2012 which demonstrated LVEF 30 to 35%, moderate left atrial enlargement, mild mitral and tricuspid regurgitation.  Her last cardiac evaluation was in the fall 2016.  I reviewed that note.  I reviewed labs performed on 08/19/2017: BUN 12, creatinine 0.99, hemoglobin 12.8, platelets 182.  I reviewed an ECG performed in our office today which demonstrated sinus rhythm with frequent PACs and late R wave transition.  She is completely unaware of her cardiomyopathy.  She denies chest pain, palpitations, shortness of breath, orthopnea, and paroxysmal nocturnal dyspnea.  She developed some mild bilateral ankle swelling if she stays on her feet all day but this resolves with leg elevation.  She denies a history of syncope.  It appears atrial for ablation was diagnosed in 2013.  She lives by  herself.  She has several adult children who live and states varying from [place] to [place].    Allergies  Allergen Reactions  . Ibuprofen Nausea And Vomiting  . Powder Rash    Gloves with powder    Current Outpatient Medications  Medication Sig Dispense Refill  . alendronate (FOSAMAX) 70 MG tablet Take 70 mg by mouth every Monday.   3  . Calcium Carb-Cholecalciferol (CALCIUM-VITAMIN D) 500-200 MG-UNIT tablet Take by mouth.    . calcium-vitamin D (OSCAL WITH D) 500-200 MG-UNIT tablet Take 1 tablet by mouth 3 (three) times daily.    . cyanocobalamin (,VITAMIN B-12,) 1000 MCG/ML injection Take by mouth.    . DOCOSAHEXAENOIC ACID PO Take 500 mg by mouth.    Marland Kitchen glucosamine-chondroitin 500-400 MG tablet Take 1 tablet by mouth daily.    . hydrochlorothiazide (MICROZIDE) 12.5 MG capsule Take 12.5 mg by mouth daily as needed for fluid.  3  . meclizine (ANTIVERT) 12.5 MG tablet Take 12.5 mg by mouth 2 (two) times daily as needed for dizziness.    . metoprolol tartrate (LOPRESSOR) 25 MG tablet Take 25 mg by mouth 2 (two) times daily.     . Multiple Vitamins-Iron (MULTIVITAMINS WITH IRON) TABS Take 1 tablet by mouth daily.    . Omega-3 Fatty Acids (FISH OIL) 500 MG CAPS Take 500 mg by mouth daily.     . vitamin B-12 (CYANOCOBALAMIN) 500 MCG tablet Take 500 mcg by mouth daily.     No current facility-administered medications for this visit.     Past Medical History:  Diagnosis Date  . Arthritis   . Ascending aortic aneurysm ([place]) 07/20/2014   4.1 cm by CT  . Atrial  fib/flutter, transient    post-op  . Constipation    corrects with diet  . Floater, vitreous    Right  . Hypertension   . Nonsquamous nonsmall cell neoplasm of lung ([place]) 09/2011   Stage IB- s/p right upper lobectomy  . Pneumonia   . Psoriasis    Ankles  . Tobacco abuse     Past Surgical History:  Procedure Laterality Date  . CATARACT EXTRACTION Bilateral 2016  . COLONOSCOPY  01/22/2012   Procedure:  COLONOSCOPY;  Surgeon: Danie Binder, MD;  [facility];  Service: Endoscopy;  Laterality: N/A;  10:45  . COLONOSCOPY N/A 04/15/2017   Procedure: COLONOSCOPY;  Surgeon: Danie Binder, MD;  [facility]: [facility];  Service: Endoscopy;  Laterality: N/A;  10:30am  . POLYPECTOMY  04/15/2017   Procedure: POLYPECTOMY;  Surgeon: Danie Binder, MD;  Location: [facility];  Service: Endoscopy;;  ascending colon  . right VATS (upper lobectomy)  08/29/11   Dr Roxan Hockey  . VIDEO BRONCHOSCOPY WITH ENDOBRONCHIAL NAVIGATION N/A 03/18/2015   Procedure: VIDEO BRONCHOSCOPY WITH ENDOBRONCHIAL NAVIGATION;  Surgeon: [name], MD;  Location: [facility];  Service: Thoracic;  Laterality: N/A;    Social History   Socioeconomic History  . Marital status: Widowed    Spouse name: Not on file  . Number of children: 3  . Years of education: Not on file  . Highest education level: Not on file  Occupational History  . Occupation: retired  Scientific laboratory technician  . Financial resource strain: Not on file  . Food insecurity:    Worry: Not on file    Inability: Not on file  . Transportation needs:    Medical: Not on file    Non-medical: Not on file  Tobacco Use  . Smoking status: Former Smoker    Packs/day: 0.30    Years: 20.00    Pack years: 6.00    Types: Cigarettes    Last attempt to quit: 01/12/2014    Years since quitting: 3.8  . Smokeless tobacco: Never Used  Substance and Sexual Activity  . Alcohol use: No  . Drug use: No  . Sexual activity: Not on file  Lifestyle  . Physical activity:    Days per week: Not on file    Minutes per session: Not on file  . Stress: Not on file  Relationships  . Social connections:    Talks on phone: Not on file    Gets together: Not on file    Attends religious service: Not on file    Active member of club or organization: Not on file    Attends meetings of clubs or organizations: Not on file    Relationship status: Not on file  . Intimate partner  violence:    Fear of current or ex partner: Not on file    Emotionally abused: Not on file    Physically abused: Not on file    Forced sexual activity: Not on file  Other Topics Concern  . Not on file  Social History Narrative   Lives w/ cousin     No family history of premature CAD in 1st degree relatives.  Current Meds  Medication Sig  . alendronate (FOSAMAX) 70 MG tablet Take 70 mg by mouth every Monday.   . Calcium Carb-Cholecalciferol (CALCIUM-VITAMIN D) 500-200 MG-UNIT tablet Take by mouth.  . calcium-vitamin D (OSCAL WITH D) 500-200 MG-UNIT tablet Take 1 tablet by mouth 3 (three) times daily.  . cyanocobalamin (,  VITAMIN B-12,) 1000 MCG/ML injection Take by mouth.  . DOCOSAHEXAENOIC ACID PO Take 500 mg by mouth.  Marland Kitchen glucosamine-chondroitin 500-400 MG tablet Take 1 tablet by mouth daily.  . hydrochlorothiazide (MICROZIDE) 12.5 MG capsule Take 12.5 mg by mouth daily as needed for fluid.  Marland Kitchen meclizine (ANTIVERT) 12.5 MG tablet Take 12.5 mg by mouth 2 (two) times daily as needed for dizziness.  . metoprolol tartrate (LOPRESSOR) 25 MG tablet Take 25 mg by mouth 2 (two) times daily.   . Multiple Vitamins-Iron (MULTIVITAMINS WITH IRON) TABS Take 1 tablet by mouth daily.  . Omega-3 Fatty Acids (FISH OIL) 500 MG CAPS Take 500 mg by mouth daily.   . vitamin B-12 (CYANOCOBALAMIN) 500 MCG tablet Take 500 mcg by mouth daily.      Review of systems complete and found to be negative unless listed above in HPI    Physical exam Blood pressure 140/80, pulse (!) 56, height 5\' 7"  (1.702 m), weight 186 lb (84.4 kg), SpO2 97 %. General: NAD Neck: No JVD, no thyromegaly or thyroid nodule.  Lungs: Clear to auscultation bilaterally with normal respiratory effort. CV: Nondisplaced PMI. Regular rate and rhythm, normal S1/S2, no S3/S4, no murmur.  No peripheral edema.  No carotid bruit.   Abdomen: Soft, nontender, no distention.  Skin: Intact without lesions or rashes.  Neurologic: Alert and  oriented x 3.  Psych: Normal affect. Extremities: No clubbing or cyanosis.  HEENT: Normal.   ECG: Most recent ECG reviewed.   Labs: Lab Results  Component Value Date/Time   K 3.9 08/19/2017 12:12 PM   BUN 12 08/19/2017 12:12 PM   CREATININE 1.10 (H) 10/10/2017 12:14 PM   CREATININE 0.99 08/19/2017 12:12 PM   ALT 9 08/19/2017 12:12 PM   HGB 12.8 08/19/2017 12:12 PM     Lipids: No results found for: LDLCALC, LDLDIRECT, CHOL, TRIG, HDL      ASSESSMENT AND PLAN:  1.  Chronic systolic heart failure/cardiomyopathy: Most recent echocardiograms from September 2013 and September 2016 reviewed above.  Unclear etiology. She denies a h/o MI.  She is entirely asymptomatic.  I will obtain a follow-up echocardiogram to evaluate for interval changes in cardiac function.  She is not in decompensated heart failure.  She is currently on metoprolol tartrate 25 mg twice daily.  I will switch to Toprol XL given her cardiomyopathy. She does not require a diuretic at this time.  She takes hydrochlorothiazide 12.5 mg as needed as prescribed by her PCP.  I may consider Entresto in the future.  2.  History of paroxysmal atrial fibrillation: This appears to be a remote history.  She is not on anticoagulation.  She is in sinus rhythm today.  3.  Hypertension: Blood pressure is mildly elevated.  I am switching Lopressor to Toprol-XL 25 mg twice daily for chronic systolic heart failure and will continue to monitor blood pressures.     Disposition: Follow up in 3 months  Signed: Kate Sable, M.D., F.A.C.C.  11/18/2017, 9:24 AM

## 2017-11-20 ENCOUNTER — Telehealth: Payer: Self-pay | Admitting: Cardiovascular Disease

## 2017-11-20 NOTE — Telephone Encounter (Signed)
ECHO Scheduled for [facility] Dec 05, 2017

## 2017-11-20 NOTE — Telephone Encounter (Signed)
Echo has been re-scheduled for 11/21/2017

## 2017-11-21 ENCOUNTER — Ambulatory Visit (INDEPENDENT_AMBULATORY_CARE_PROVIDER_SITE_OTHER): Payer: Medicare Other

## 2017-11-21 ENCOUNTER — Other Ambulatory Visit: Payer: Self-pay

## 2017-11-21 DIAGNOSIS — I5022 Chronic systolic (congestive) heart failure: Secondary | ICD-10-CM

## 2017-11-22 ENCOUNTER — Telehealth: Payer: Self-pay | Admitting: *Deleted

## 2017-11-22 NOTE — Telephone Encounter (Signed)
Notes recorded by Laurine Blazer, LPN on 5/42/7062 at 37:62 AM EDT Patient notified. Copy to pmd. Follow up scheduled for August with Dr. Bronson Ing.   ------  Notes recorded by Herminio Commons, MD on 11/21/2017 at 4:07 PM EDT Pumping function remains reduced. Valve leakage was noted. This is not significantly changed in the last several years.

## 2017-12-05 ENCOUNTER — Other Ambulatory Visit: Payer: Medicare Other

## 2018-01-20 DIAGNOSIS — M810 Age-related osteoporosis without current pathological fracture: Secondary | ICD-10-CM | POA: Diagnosis not present

## 2018-01-20 DIAGNOSIS — I10 Essential (primary) hypertension: Secondary | ICD-10-CM | POA: Diagnosis not present

## 2018-01-20 DIAGNOSIS — J449 Chronic obstructive pulmonary disease, unspecified: Secondary | ICD-10-CM | POA: Diagnosis not present

## 2018-01-20 DIAGNOSIS — R42 Dizziness and giddiness: Secondary | ICD-10-CM | POA: Diagnosis not present

## 2018-02-13 ENCOUNTER — Other Ambulatory Visit: Payer: Self-pay

## 2018-02-13 NOTE — Patient Outreach (Addendum)
[facility]) Care Management  02/13/2018  HAMDI VARI 1940/09/14 [id number]   Telephone Screen  Referral Date: 02/13/18 Referral Source: EMMI Prevent Referral Reason: " patient engagement tool score of 9, HTN, CA" Insurance: [facility] Medicare   Outreach attempt # 1 to patient. No answer at present and unable to leave message.     Plan: RN CM will make outreach attempt to patient within 3-4 business days. RN CM will send unsuccessful outreach letter to patient.   Enzo Montgomery, RN,BSN,CCM [place] [facility] Telephonic Care Management Coordinator Direct Phone: [phone] Toll Free: [phone] Fax: [phone]

## 2018-02-17 ENCOUNTER — Inpatient Hospital Stay: Payer: Medicare Other | Attending: Internal Medicine

## 2018-02-17 ENCOUNTER — Other Ambulatory Visit: Payer: Self-pay

## 2018-02-17 ENCOUNTER — Ambulatory Visit (HOSPITAL_COMMUNITY)
Admission: RE | Admit: 2018-02-17 | Discharge: 2018-02-17 | Disposition: A | Discharge status: Home / Self Care | Payer: Medicare Other | Source: Ambulatory Visit | Attending: Internal Medicine | Admitting: Internal Medicine

## 2018-02-17 DIAGNOSIS — I10 Essential (primary) hypertension: Secondary | ICD-10-CM | POA: Diagnosis not present

## 2018-02-17 DIAGNOSIS — I2510 Atherosclerotic heart disease of native coronary artery without angina pectoris: Secondary | ICD-10-CM | POA: Insufficient documentation

## 2018-02-17 DIAGNOSIS — C3490 Malignant neoplasm of unspecified part of unspecified bronchus or lung: Secondary | ICD-10-CM | POA: Diagnosis not present

## 2018-02-17 DIAGNOSIS — C3412 Malignant neoplasm of upper lobe, left bronchus or lung: Secondary | ICD-10-CM | POA: Insufficient documentation

## 2018-02-17 DIAGNOSIS — Z85118 Personal history of other malignant neoplasm of bronchus and lung: Secondary | ICD-10-CM | POA: Insufficient documentation

## 2018-02-17 DIAGNOSIS — J439 Emphysema, unspecified: Secondary | ICD-10-CM | POA: Insufficient documentation

## 2018-02-17 DIAGNOSIS — I700 Atherosclerosis of aorta: Secondary | ICD-10-CM | POA: Diagnosis not present

## 2018-02-17 DIAGNOSIS — Z923 Personal history of irradiation: Secondary | ICD-10-CM | POA: Diagnosis not present

## 2018-02-17 DIAGNOSIS — Z79899 Other long term (current) drug therapy: Secondary | ICD-10-CM | POA: Diagnosis not present

## 2018-02-17 LAB — CMP (CANCER CENTER ONLY)
ALT: 11 U/L (ref 0–44)
AST: 18 U/L (ref 15–41)
Albumin: 3.9 g/dL (ref 3.5–5.0)
Alkaline Phosphatase: 55 U/L (ref 38–126)
Anion gap: 10 (ref 5–15)
BUN: 11 mg/dL (ref 8–23)
CO2: 28 mmol/L (ref 22–32)
Calcium: 9.8 mg/dL (ref 8.9–10.3)
Chloride: 104 mmol/L (ref 98–111)
Creatinine: 0.98 mg/dL (ref 0.44–1.00)
GFR, EST NON AFRICAN AMERICAN: 54 mL/min — AB (ref >60)
GFR, Est AFR Am: >60 mL/min (ref >60)
Glucose, Bld: 96 mg/dL (ref 70–99)
Potassium: 3.6 mmol/L (ref 3.5–5.1)
Sodium: 142 mmol/L (ref 135–145)
TOTAL PROTEIN: 7.1 g/dL (ref 6.5–8.1)
Total Bilirubin: 1 mg/dL (ref 0.3–1.2)

## 2018-02-17 LAB — CBC WITH DIFFERENTIAL (CANCER CENTER ONLY)
BASOS ABS: 0 10*3/uL (ref 0.0–0.1)
Basophils Relative: 0 %
EOS PCT: 1 %
Eosinophils Absolute: 0.1 10*3/uL (ref 0.0–0.5)
HCT: 36.3 % (ref 34.8–46.6)
Hemoglobin: 11.9 g/dL (ref 11.6–15.9)
LYMPHS ABS: 2.9 10*3/uL (ref 0.9–3.3)
Lymphocytes Relative: 44 %
MCH: 30.3 pg (ref 25.1–34.0)
MCHC: 32.8 g/dL (ref 31.5–36.0)
MCV: 92.4 fL (ref 79.5–101.0)
Monocytes Absolute: 0.4 10*3/uL (ref 0.1–0.9)
Monocytes Relative: 7 %
NEUTROS PCT: 48 %
Neutro Abs: 3.1 10*3/uL (ref 1.5–6.5)
PLATELETS: 169 10*3/uL (ref 145–400)
RBC: 3.93 MIL/uL (ref 3.70–5.45)
RDW: 14.3 % (ref 11.2–14.5)
WBC: 6.6 10*3/uL (ref 3.9–10.3)

## 2018-02-17 MED ORDER — IOHEXOL 300 MG/ML  SOLN
75.0000 mL | Freq: Once | INTRAMUSCULAR | Status: AC | PRN
Start: 2018-02-17 — End: 2018-02-17
  Administered 2018-02-17: 75 mL via INTRAVENOUS

## 2018-02-17 NOTE — Patient Outreach (Signed)
[place] [facility]) Care Management  02/17/2018  Heather [name] August 14, 1940 [id number]   Telephone Screen  Referral Date: 02/13/18 Referral Source: EMMI Prevent Referral Reason: " patient engagement tool score of 9, HTN, CA" Insurance: [facility] Medicare    Outreach attempt #2 to patient. No answer after several rings and no voicemail.      Plan: RN CM will make outreach attempt to patient within 3-4 business days.   Enzo Montgomery, RN,BSN,CCM [place] Management Telephonic Care Management Coordinator Direct Phone: [phone] Toll Free: [phone] Fax: [phone]

## 2018-02-18 ENCOUNTER — Other Ambulatory Visit: Payer: Self-pay

## 2018-02-18 NOTE — Patient Outreach (Signed)
[facility]) Care Management  02/18/2018  DEBANY VANTOL May 01, 1941 [id number]   Telephone Screen  Referral Date:02/13/18 Referral Source:EMMI Prevent Referral Reason:" patient engagement tool score of9, HTN, [place]" Insurance:[employer] Medicare    Outreach attempt #3 to patient. No answer at present and unable to leave message.     Plan: RN CM will close case if no response from letter mailed to patient.   Enzo Montgomery, RN,BSN,CCM [place] [facility] Telephonic Care Management Coordinator Direct Phone: [phone] Toll Free: [phone] Fax: [phone]

## 2018-02-19 ENCOUNTER — Telehealth: Payer: Self-pay | Admitting: Internal Medicine

## 2018-02-19 ENCOUNTER — Encounter: Payer: Self-pay | Admitting: Internal Medicine

## 2018-02-19 ENCOUNTER — Inpatient Hospital Stay (HOSPITAL_BASED_OUTPATIENT_CLINIC_OR_DEPARTMENT_OTHER): Payer: Medicare Other | Admitting: Internal Medicine

## 2018-02-19 VITALS — BP 146/88 | HR 66 | Temp 99.0°F | Resp 18 | Ht 67.0 in | Wt 177.1 lb

## 2018-02-19 DIAGNOSIS — I10 Essential (primary) hypertension: Secondary | ICD-10-CM

## 2018-02-19 DIAGNOSIS — Z923 Personal history of irradiation: Secondary | ICD-10-CM | POA: Diagnosis not present

## 2018-02-19 DIAGNOSIS — C3412 Malignant neoplasm of upper lobe, left bronchus or lung: Secondary | ICD-10-CM | POA: Diagnosis not present

## 2018-02-19 DIAGNOSIS — Z85118 Personal history of other malignant neoplasm of bronchus and lung: Secondary | ICD-10-CM

## 2018-02-19 DIAGNOSIS — Z79899 Other long term (current) drug therapy: Secondary | ICD-10-CM | POA: Diagnosis not present

## 2018-02-19 DIAGNOSIS — C3491 Malignant neoplasm of unspecified part of right bronchus or lung: Secondary | ICD-10-CM

## 2018-02-19 DIAGNOSIS — C3490 Malignant neoplasm of unspecified part of unspecified bronchus or lung: Secondary | ICD-10-CM

## 2018-02-19 NOTE — Telephone Encounter (Signed)
Scheduled appt per 8/14 los - sent reminder letter in the mail with appt date and time.

## 2018-02-19 NOTE — Progress Notes (Signed)
[facility] [phone]   Fax:([phone]) [place], [place], [place] [address] [place] [address]  DIAGNOSIS:  1) Stage IA non-small cell lung cancer diagnosed in March 2017.  2) She now has recurrent disease in the left upper lobe suspicious for stage IA.  She also has some other small pulmonary nodules that could be low-grade adenocarcinoma.  PRIOR THERAPY:  1) status post right upper lobectomy with lymph node dissection on September 24, 2011 2) status post stereotactic radiotherapy to the left upper lobe lung nodule in February 2019.  CURRENT THERAPY: Observation.  INTERVAL HISTORY: Heather [name] 77 y.o. female returns to the clinic today for 4 months follow-up visit.  The patient is feeling fine today with no specific complaints except for mild fatigue.  She denied having any recent chest pain, shortness breath, cough or hemoptysis.  She denied having any nausea, vomiting, diarrhea or constipation.  She has no significant weight loss or night sweats.  The patient had repeat CT scan of the chest performed recently and she is here for evaluation and discussion of her risk her results.  MEDICAL HISTORY: Past Medical History:  Diagnosis Date  . Arthritis   . Ascending aortic aneurysm ([place]) 07/20/2014   4.1 cm by CT  . Atrial fib/flutter, transient    post-op  . Constipation    corrects with diet  . Floater, vitreous    Right  . Hypertension   . Nonsquamous nonsmall cell neoplasm of lung ([place]) 09/2011   Stage IB- s/p right upper lobectomy  . Pneumonia   . Psoriasis    Ankles  . Tobacco abuse     ALLERGIES:  is allergic to ibuprofen and powder.  MEDICATIONS:  Current Outpatient Medications  Medication Sig Dispense Refill  . alendronate (FOSAMAX) 70 MG tablet Take 70 mg by mouth every Monday.   3  . Calcium Carb-Cholecalciferol (CALCIUM-VITAMIN D) 500-200 MG-UNIT tablet Take by mouth.    . calcium-vitamin D  (OSCAL WITH D) 500-200 MG-UNIT tablet Take 1 tablet by mouth 3 (three) times daily.    . cyanocobalamin (,VITAMIN B-12,) 1000 MCG/ML injection Take by mouth.    . DOCOSAHEXAENOIC ACID PO Take 500 mg by mouth.    Marland Kitchen glucosamine-chondroitin 500-400 MG tablet Take 1 tablet by mouth daily.    . hydrochlorothiazide (MICROZIDE) 12.5 MG capsule Take 12.5 mg by mouth daily as needed for fluid.  3  . meclizine (ANTIVERT) 12.5 MG tablet Take 12.5 mg by mouth 2 (two) times daily as needed for dizziness.    . metoprolol succinate (TOPROL XL) 25 MG 24 hr tablet Take 1 tablet (25 mg total) by mouth 2 (two) times daily. 60 tablet 6  . Multiple Vitamins-Iron (MULTIVITAMINS WITH IRON) TABS Take 1 tablet by mouth daily.    . Omega-3 Fatty Acids (FISH OIL) 500 MG CAPS Take 500 mg by mouth daily.     . vitamin B-12 (CYANOCOBALAMIN) 500 MCG tablet Take 500 mcg by mouth daily.     No current facility-administered medications for this visit.     SURGICAL HISTORY:  Past Surgical History:  Procedure Laterality Date  . CATARACT EXTRACTION Bilateral 2016  . COLONOSCOPY  01/22/2012   Procedure: COLONOSCOPY;  Surgeon: Danie Binder, MD;  [facility];  Service: Endoscopy;  Laterality: N/A;  10:45  . COLONOSCOPY N/A 04/15/2017   Procedure: COLONOSCOPY;  Surgeon: Danie Binder, MD;  [facility];  Service: Endoscopy;  Laterality: N/A;  10:30am  . POLYPECTOMY  04/15/2017   Procedure: POLYPECTOMY;  Surgeon: Danie Binder, MD;  [facility];  Service: Endoscopy;;  ascending colon  . right VATS (upper lobectomy)  08/29/11   Dr Roxan Hockey  . VIDEO BRONCHOSCOPY WITH ENDOBRONCHIAL NAVIGATION N/A 03/18/2015   Procedure: VIDEO BRONCHOSCOPY WITH ENDOBRONCHIAL NAVIGATION;  Surgeon: [name], MD;  Location: [facility];  Service: Thoracic;  Laterality: N/A;    REVIEW OF SYSTEMS:  A comprehensive review of systems was negative.   PHYSICAL EXAMINATION: General appearance: alert, cooperative  and no distress Head: Normocephalic, without obvious abnormality, atraumatic Neck: no adenopathy, no JVD, supple, symmetrical, trachea midline and thyroid not enlarged, symmetric, no tenderness/mass/nodules Lymph nodes: Cervical, supraclavicular, and axillary nodes normal. Resp: clear to auscultation bilaterally Back: symmetric, no curvature. ROM normal. No CVA tenderness. Cardio: regular rate and rhythm, S1, S2 normal, no murmur, click, rub or gallop GI: soft, non-tender; bowel sounds normal; no masses,  no organomegaly Extremities: extremities normal, atraumatic, no cyanosis or edema  ECOG PERFORMANCE STATUS: 1 - Symptomatic but completely ambulatory  Blood pressure (!) 146/88, pulse 66, temperature 99 F (37.2 C), temperature source Oral, resp. rate 18, height 5\' 7"  (1.702 m), weight 177 lb 1.6 oz (80.3 kg), SpO2 97 %.  LABORATORY DATA: Lab Results  Component Value Date   WBC 6.6 02/17/2018   HGB 11.9 02/17/2018   HCT 36.3 02/17/2018   MCV 92.4 02/17/2018   PLT 169 02/17/2018      Chemistry      Component Value Date/Time   NA 142 02/17/2018 0947   K 3.6 02/17/2018 0947   CL 104 02/17/2018 0947   CO2 28 02/17/2018 0947   BUN 11 02/17/2018 0947   CREATININE 0.98 02/17/2018 0947      Component Value Date/Time   CALCIUM 9.8 02/17/2018 0947   ALKPHOS 55 02/17/2018 0947   AST 18 02/17/2018 0947   ALT 11 02/17/2018 0947   BILITOT 1.0 02/17/2018 0947       RADIOGRAPHIC STUDIES: Ct Chest W Contrast  Result Date: 02/17/2018 CLINICAL DATA:  Lung cancer staging. EXAM: CT CHEST WITH CONTRAST TECHNIQUE: Multidetector CT imaging of the chest was performed during intravenous contrast administration. CONTRAST:  57mL OMNIPAQUE IOHEXOL 300 MG/ML  SOLN COMPARISON:  10/10/2017 and PET 07/16/2017. FINDINGS: Cardiovascular: Atherosclerotic calcification of the arterial vasculature, including coronary arteries. Heart is enlarged. No pericardial effusion. Mediastinum/Nodes: No  pathologically enlarged mediastinal, hilar or axillary lymph nodes. Esophagus is grossly unremarkable. Lungs/Pleura: Centrilobular emphysema. Postoperative scarring and volume loss in right upper and right lower lobes. Spiculated nodule in the medial left upper lobe measures 1.1 x 2.1 cm (series 5, image 52), increased from 1.3 x 1.7 cm on 10/10/2017. Numerous ground-glass nodules are seen bilaterally, as before. Index apical left upper lobe nodule measures 0.9 x 1.6 cm (image 29), as before. No pleural fluid. Airway is unremarkable. Upper Abdomen: Visualized portions of the liver and adrenal glands are unremarkable. Scarring in the kidneys bilaterally. Low-attenuation lesions in the kidneys measure up to 1.5 cm, as before, but too small to characterize. Visualized portions of the spleen, pancreas, stomach and bowel are grossly unremarkable. No upper abdominal adenopathy. Musculoskeletal: Degenerative changes in the spine. No worrisome lytic or sclerotic lesions. IMPRESSION: 1. Enlarging solid nodule in the medial left upper lobe, worrisome for invasive primary bronchogenic carcinoma. 2. Multiple bilateral ground-glass pulmonary nodules, as before, 2 of which showed hypermetabolism on 07/16/2017. 3. Aortic  atherosclerosis (ICD10-170.0). Coronary artery calcification. 4.  Emphysema (ICD10-J43.9). Electronically Signed   By: Lorin Picket M.D.   On: 02/17/2018 13:30    ASSESSMENT AND PLAN: This is a very pleasant 77 years old African-American female with recurrent non-small cell lung cancer presented with right upper lobe lung nodule that was previously treated with a stereotactic radiotherapy.  She is currently on observation and repeat CT scan of the chest showed further increase in the size of the left upper lobe lung nodule. Molecular studies are positive for MEK1 mutation but no FDA approved treatment options yet. I personally and independently reviewed the scan images and discussed the results with the  patient today. I discussed with the patient several options for management of her condition including continuous observation versus referral to interventional radiology for consideration of radiofrequency ablation of this lesion.  The patient is interested in the procedure and I will refer her for discussion of this option. I will see her back for follow-up visit in 3 months for evaluation with repeat CT scan of the chest for restaging of her disease. She was advised to call immediately if she has any concerning symptoms in the interval. The patient voices understanding of current disease status and treatment options and is in agreement with the current care plan.  All questions were answered. The patient knows to call the clinic with any problems, questions or concerns. We can certainly see the patient much sooner if necessary.  I spent 10 minutes counseling the patient face to face. The total time spent in the appointment was 15 minutes.  Disclaimer: This note was dictated with voice recognition software. Similar sounding words can inadvertently be transcribed and may not be corrected upon review.

## 2018-02-21 IMAGING — CT NM PET TUM IMG INITIAL (PI) SKULL BASE T - THIGH
1 of 9 series · 1 of 25 positions shown · non-contrast
Comparison: CT 06/04/2017

CLINICAL DATA: Subsequent treatment strategy for pulmonary nodule..
Lung cancer. RIGHT upper lobectomy.

EXAM:
NUCLEAR MEDICINE PET SKULL BASE TO THIGH
TECHNIQUE: 8.2 mCi F-18 FDG was injected intravenously. Full-ring PET imaging
was performed from the skull base to thigh after the radiotracer. CT
data was obtained and used for attenuation correction and anatomic
localization.
FASTING BLOOD GLUCOSE:  Value: 90 mg/dl

[Series 4: ct sk_thigh 5.0 b31f · axial · 5.0mm · 0.98mm/px · 1 of 223 slices shown]
[im 223/223  brain]
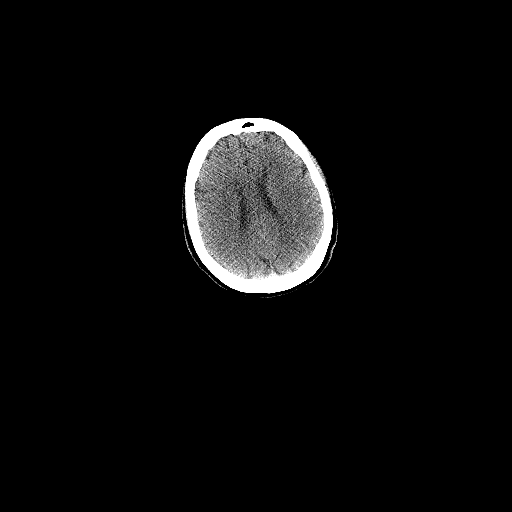

[1 of 25 positions shown; findings below may reference images not displayed]

FINDINGS: NECK

No hypermetabolic lymph nodes in the neck.

CHEST

The enlarging nodule in the medial aspect of the LEFT upper lobe
measures 13 mm and has moderate metabolic activity with SUV max
equal 4.6.

Small semi-solid nodule in the LEFT upper lobe measuring 7 mm (image
65, series 4) also has faint metabolic activity with SUV max equal
1.4.

Ground-glass nodule in the LEFT upper lobe measuring 16 mm (image
55, series 4) has mild metabolic activity with SUV max

In superior aspect of the RIGHT lower lobe at the surgical margin
there is a focus of metabolic activity with SUV max equal 3.3. There
is small nodular lesion at this site measuring 8 mm (image 65,
series 4).

Additional scattered pulmonary nodules without metabolic activity.

No hypermetabolic mediastinal lymph nodes.

ABDOMEN/PELVIS

No abnormal hypermetabolic activity within the liver, pancreas,
adrenal glands, or spleen. No hypermetabolic lymph nodes in the
abdomen or pelvis. Gallstones noted

Uterus normal. Leiomyoma extending from the posterior wall the
uterus. Extensive vascular calcification

SKELETON

No focal hypermetabolic activity to suggest skeletal metastasis.
IMPRESSION: 1. Enlarging nodule of concern in the medial aspect of the LEFT
upper lobe is hypermetabolic and most consists with bronchogenic
carcinoma.
2. Two additional ground-glass / semi solid nodules in LEFT upper
lobe with mild metabolic activity are concerning for adenocarcinoma.
3. Hypermetabolic activity the resection margin at the RIGHT hilum
consistent with post therapy inflammation versus local recurrence.
Recommend attention on follow-up.
4. No hypermetabolic mediastinal adenopathy or distant disease.

## 2018-02-26 ENCOUNTER — Other Ambulatory Visit: Payer: Self-pay

## 2018-02-26 NOTE — Patient Outreach (Signed)
[facility]) Care Management  02/26/2018  Heather [name] 07-21-1940 [id number]   Telephone Screen  Referral Date:02/13/18 Referral Source:EMMI Prevent Referral Reason:" patient engagement tool score of9, HTN, CA" Insurance:[employer] Medicare   Multiple attempts to establish contact with patient without success. No response from letter mailed to patient. Case is being closed at this time.     Plan: RN CM will close case at this time.   Enzo Montgomery, RN,BSN,CCM [facility] Telephonic Care Management Coordinator Direct Phone: [phone] Toll Free: [phone] Fax: [phone]

## 2018-02-27 ENCOUNTER — Ambulatory Visit: Payer: Medicare Other | Admitting: Cardiovascular Disease

## 2018-02-27 ENCOUNTER — Encounter: Payer: Self-pay | Admitting: Student

## 2018-02-27 ENCOUNTER — Ambulatory Visit (INDEPENDENT_AMBULATORY_CARE_PROVIDER_SITE_OTHER): Payer: Medicare Other | Admitting: Student

## 2018-02-27 VITALS — BP 104/70 | HR 62 | Ht 67.0 in | Wt 173.0 lb

## 2018-02-27 DIAGNOSIS — I10 Essential (primary) hypertension: Secondary | ICD-10-CM

## 2018-02-27 DIAGNOSIS — I480 Paroxysmal atrial fibrillation: Secondary | ICD-10-CM

## 2018-02-27 DIAGNOSIS — I5042 Chronic combined systolic (congestive) and diastolic (congestive) heart failure: Secondary | ICD-10-CM | POA: Diagnosis not present

## 2018-02-27 DIAGNOSIS — C3412 Malignant neoplasm of upper lobe, left bronchus or lung: Secondary | ICD-10-CM

## 2018-02-27 NOTE — Progress Notes (Signed)
Cardiology Office Note    Date:  02/27/2018   ID:  Heather [name], Heather [name] [birth year], MRN [id number]  PCP:  Rosita Fire, MD  Cardiologist: Kate Sable, MD    Chief Complaint  Patient presents with  . Follow-up    3 month visit    History of Present Illness:    Heather [name] is a 77 y.o. female with past medical history of chronic combined systolic and diastolic CHF (EF 02-54% by echo in 2013 and 03/2015), paroxysmal atrial fibrillation, HTN, ascending aortic aneurysm, and adenocarcinoma of the left upper lung who presents to the office today for 21-month follow-up.  She was last examined by Dr. Bronson Ing in 11/2017 and reported overall doing well from a cardiac perspective at that time.  She denied any recent chest pain or dyspnea on exertion. Lopressor was transitioned to Toprol-XL 25 mg twice daily and she was continued on PRN HCTZ 12.5 mg daily. A follow-up echocardiogram was obtained which showed her EF remained reduced at 30 to 35% with diffuse hypokinesis, mild to moderate MR, and the inability to exclude a laminated thrombus in the left apex. This was reviewed by Dr. Bronson Ing and no significant changes were noted when compared to prior imaging.  In talking with the patient today, she reports overall doing well since her last office visit. She denies any recent chest pain, dyspnea on exertion, orthopnea, PND, or lower extremity edema. She does check her blood pressure regularly at home and says her systolic readings are typically in the low 100's to 120's. She denies any associated lightheadedness, dizziness, or presyncope. Reports weight has actually been declining on her home scales over the past few months. Says she still has a good appetite.   Past Medical History:  Diagnosis Date  . Arthritis   . Ascending aortic aneurysm ([place]) 07/20/2014   4.1 cm by CT  . Atrial fib/flutter, transient    post-op  . CHF (congestive heart failure) ([place])    a. EF 30-35% by echo in  2013 --> similar results by repeat imaging in 2016 and 11/2017.  Marland Kitchen Constipation    corrects with diet  . Floater, vitreous    Right  . Hypertension   . Nonsquamous nonsmall cell neoplasm of lung ([place]) 09/2011   Stage IB- s/p right upper lobectomy  . Pneumonia   . Psoriasis    Ankles  . Tobacco abuse     Past Surgical History:  Procedure Laterality Date  . CATARACT EXTRACTION Bilateral 2016  . COLONOSCOPY  01/22/2012   Procedure: COLONOSCOPY;  Surgeon: Danie Binder, MD;  [facility];  Service: Endoscopy;  Laterality: N/A;  10:45  . COLONOSCOPY N/A 04/15/2017   Procedure: COLONOSCOPY;  Surgeon: Danie Binder, MD;  [facility];  Service: Endoscopy;  Laterality: N/A;  10:30am  . POLYPECTOMY  04/15/2017   Procedure: POLYPECTOMY;  Surgeon: Danie Binder, MD;  [facility];  Service: Endoscopy;;  ascending colon  . right VATS (upper lobectomy)  08/29/11   Dr Roxan Hockey  . VIDEO BRONCHOSCOPY WITH ENDOBRONCHIAL NAVIGATION N/A 03/18/2015   Procedure: VIDEO BRONCHOSCOPY WITH ENDOBRONCHIAL NAVIGATION;  Surgeon: [name], MD;  Location: [facility];  Service: Thoracic;  Laterality: N/A;    Current Medications: Outpatient Medications Prior to Visit  Medication Sig Dispense Refill  . alendronate (FOSAMAX) 70 MG tablet Take 70 mg by mouth every Monday.   3  . Calcium Carb-Cholecalciferol (CALCIUM-VITAMIN D) 500-200 MG-UNIT tablet Take by  mouth.    . calcium-vitamin D (OSCAL WITH D) 500-200 MG-UNIT tablet Take 1 tablet by mouth 3 (three) times daily.    . cyanocobalamin (,VITAMIN B-12,) 1000 MCG/ML injection Take by mouth.    . DOCOSAHEXAENOIC ACID PO Take 500 mg by mouth.    Marland Kitchen glucosamine-chondroitin 500-400 MG tablet Take 1 tablet by mouth daily.    . hydrochlorothiazide (MICROZIDE) 12.5 MG capsule Take 12.5 mg by mouth daily as needed for fluid.  3  . meclizine (ANTIVERT) 12.5 MG tablet Take 12.5 mg by mouth 2 (two) times daily as needed for  dizziness.    . metoprolol succinate (TOPROL XL) 25 MG 24 hr tablet Take 1 tablet (25 mg total) by mouth 2 (two) times daily. 60 tablet 6  . Multiple Vitamins-Iron (MULTIVITAMINS WITH IRON) TABS Take 1 tablet by mouth daily.    . Omega-3 Fatty Acids (FISH OIL) 500 MG CAPS Take 500 mg by mouth daily.     . vitamin B-12 (CYANOCOBALAMIN) 500 MCG tablet Take 500 mcg by mouth daily.     No facility-administered medications prior to visit.      Allergies:   Ibuprofen and Powder   Social History   Socioeconomic History  . Marital status: Widowed    Spouse name: Not on file  . Number of children: 3  . Years of education: Not on file  . Highest education level: Not on file  Occupational History  . Occupation: retired  Scientific laboratory technician  . Financial resource strain: Not on file  . Food insecurity:    Worry: Not on file    Inability: Not on file  . Transportation needs:    Medical: Not on file    Non-medical: Not on file  Tobacco Use  . Smoking status: Former Smoker    Packs/day: 0.30    Years: 20.00    Pack years: 6.00    Types: Cigarettes    Last attempt to quit: 01/12/2014    Years since quitting: 4.1  . Smokeless tobacco: Never Used  Substance and Sexual Activity  . Alcohol use: No  . Drug use: No  . Sexual activity: Not on file  Lifestyle  . Physical activity:    Days per week: Not on file    Minutes per session: Not on file  . Stress: Not on file  Relationships  . Social connections:    Talks on phone: Not on file    Gets together: Not on file    Attends religious service: Not on file    Active member of club or organization: Not on file    Attends meetings of clubs or organizations: Not on file    Relationship status: Not on file  Other Topics Concern  . Not on file  Social History Narrative   Lives w/ cousin     Family History:  The patient's family history includes Heart disease in her brother, mother, and sister; Hypertension in her mother.   Review of Systems:    Please see the history of present illness.     General:  No chills, fever, night sweats or weight changes.  Cardiovascular:  No chest pain, dyspnea on exertion, edema, orthopnea, palpitations, paroxysmal nocturnal dyspnea. Dermatological: No rash, lesions/masses Respiratory: No cough, dyspnea Urologic: No hematuria, dysuria Abdominal:   No nausea, vomiting, diarrhea, bright red blood per rectum, melena, or hematemesis Neurologic:  No visual changes, wkns, changes in mental status.  She denies any recent symptoms.   All other systems reviewed  and are otherwise negative except as noted above.   Physical Exam:    VS:  BP 104/70   Pulse 62   Ht 5\' 7"  (1.702 m)   Wt 173 lb (78.5 kg)   SpO2 97%   BMI 27.10 kg/m    General: Well developed, elderly African American female appearing in no acute distress. Head: Normocephalic, atraumatic, sclera non-icteric, no xanthomas, nares are without discharge.  Neck: No carotid bruits. JVD not elevated.  Lungs: Respirations regular and unlabored, without wheezes or rales.  Heart: Regular rate and rhythm. No S3 or S4.  No murmur, no rubs, or gallops appreciated. Abdomen: Soft, non-tender, non-distended with normoactive bowel sounds. No hepatomegaly. No rebound/guarding. No obvious abdominal masses. Msk:  Strength and tone appear normal for age. No joint deformities or effusions. Extremities: No clubbing or cyanosis. Trace lower extremity edema.  Distal pedal pulses are 2+ bilaterally. Neuro: Alert and oriented X 3. Moves all extremities spontaneously. No focal deficits noted. Psych:  Responds to questions appropriately with a normal affect. Skin: No rashes or lesions noted  Wt Readings from Last 3 Encounters:  02/27/18 173 lb (78.5 kg)  02/19/18 177 lb 1.6 oz (80.3 kg)  11/18/17 186 lb (84.4 kg)     Studies/Labs Reviewed:   EKG:  EKG is not ordered today.   Recent Labs: 02/17/2018: ALT 11; BUN 11; Creatinine 0.98; Hemoglobin 11.9; Platelet  Count 169; Potassium 3.6; Sodium 142   Lipid Panel No results found for: CHOL, TRIG, HDL, CHOLHDL, VLDL, LDLCALC, LDLDIRECT  Additional studies/ records that were reviewed today include:   Echocardiogram: 11/21/2017 Study Conclusions  - Left ventricle: The cavity size was mildly dilated. Wall   thickness was normal. Systolic function was moderately to   severely reduced. The estimated ejection fraction was in the   range of 30% to 35%. Diffuse hypokinesis. Doppler parameters are   consistent with abnormal left ventricular relaxation (grade 1   diastolic dysfunction). - Regional wall motion abnormality: Hypokinesis of the basal   inferolateral myocardium. - Aortic valve: Trileaflet; mildly calcified leaflets. There was   trivial regurgitation. - Mitral valve: Mild bileaflet prolapse. There was mild to moderate   regurgitation. - Left atrium: The atrium was moderately dilated. - Right atrium: The atrium was mildly dilated. - Tricuspid valve: There was mild regurgitation. - Pulmonic valve: There was mild regurgitation.  Impressions:  - Cannot exclude laminated thrombus in left ventricular apex.  Assessment:    1. Chronic combined systolic and diastolic heart failure ([place])   2. PAF (paroxysmal atrial fibrillation) ([place])   3. Essential hypertension   4. Primary adenocarcinoma of upper lobe of left lung ([place])      Plan:   In order of problems listed above:  1. Chronic Combined Systolic and Diastolic CHF - EF 52-84% by echo in 2013 and 03/2015 with repeat imaging in 11/2017 showing EF remained reduced at 30 to 35% with diffuse hypokinesis, mild to moderate MR, and the inability to exclude a laminated thrombus in the left apex. This was reviewed by Dr. Bronson Ing and no significant changes were noted when compared to prior imaging. Ischemic evaluation has not been pursued as she has been asymptomatic. Reviewed briefly today and she does not wish to pursue further testing at  this time.  - she denies any recent dyspnea on exertion, orthopnea, PND, or lower extremity edema. Appears euvolemic by examination today.  - remains on Toprol-XL 25mg  BID. Not able to add ACE-I or ARB at this time  given soft BP. She does take HCTZ as needed for edema. We reviewed that if fluid retention becomes more common, we could consider transition to Lasix.   2. Paroxysmal Atrial Fibrillation - a remote history of this by review of prior notes. She denies any recent palpitations. Maintaining NSR by examination today. Not on anticoagulation given no recent recurrence.   3. HTN - BP is well-controlled at 104/70 during today's visit.  - continue current medication regimen.   4. Adenocarcinoma of Left Upper Lung - followed by Oncology - Dr. Julien Nordmann.    Medication Adjustments/Labs and Tests Ordered: Current medicines are reviewed at length with the patient today.  Concerns regarding medicines are outlined above.  Medication changes, Labs and Tests ordered today are listed in the Patient Instructions below. Patient Instructions  Medication Instructions:  Your physician recommends that you continue on your current medications as directed. Please refer to the Current Medication list given to you today.   Labwork: NONE   Testing/Procedures: NONE   Follow-Up: Your physician recommends that you schedule a follow-up appointment in: 6 Months with Dr. Bronson Ing.   Any Other Special Instructions Will Be Listed Below (If Applicable).  If you need a refill on your cardiac medications before your next appointment, please call your pharmacy.  Thank you for choosing [facility]!    Signed, Erma Heritage, PA-C  02/27/2018 9:34 PM    [facility] [address] [place], [place] [address] Phone: [phone]

## 2018-02-27 NOTE — Patient Instructions (Signed)
Medication Instructions:  Your physician recommends that you continue on your current medications as directed. Please refer to the Current Medication list given to you today.   Labwork: NONE   Testing/Procedures: NONE   Follow-Up: Your physician recommends that you schedule a follow-up appointment in: 6 Months with Dr. Bronson Ing.    Any Other Special Instructions Will Be Listed Below (If Applicable).     If you need a refill on your cardiac medications before your next appointment, please call your pharmacy.  Thank you for choosing [facility]!

## 2018-03-04 ENCOUNTER — Ambulatory Visit
Admission: RE | Admit: 2018-03-04 | Discharge: 2018-03-04 | Disposition: A | Discharge status: Home / Self Care | Payer: Medicare Other | Source: Ambulatory Visit | Attending: Internal Medicine | Admitting: Internal Medicine

## 2018-03-04 ENCOUNTER — Other Ambulatory Visit: Payer: Self-pay | Admitting: Interventional Radiology

## 2018-03-04 DIAGNOSIS — C3412 Malignant neoplasm of upper lobe, left bronchus or lung: Secondary | ICD-10-CM | POA: Diagnosis not present

## 2018-03-04 DIAGNOSIS — C3410 Malignant neoplasm of upper lobe, unspecified bronchus or lung: Secondary | ICD-10-CM

## 2018-03-04 DIAGNOSIS — C3490 Malignant neoplasm of unspecified part of unspecified bronchus or lung: Secondary | ICD-10-CM

## 2018-03-04 HISTORY — PX: IR RADIOLOGIST EVAL & MGMT: IMG5224

## 2018-03-04 NOTE — Consult Note (Signed)
Chief Complaint: Patient was seen in consultation today for lung cancer at the request of [facility]  Referring Physician(s): Mohamed,Mohamed  History of Present Illness: Heather [name] is a 77 y.o. female former smoker with a history of recurrent stage Ia left upper lobe non-small cell bronchogenic carcinoma.  She was initially diagnosed with right-sided lung cancer in March 2013 and underwent definitive right upper lobectomy with lymph node dissection.  Unfortunately, she was diagnosed with a second primary lesion, this time in the left upper lobe in early 2019.  She underwent definitive stereotactic radiotherapy in February 2019.  Subsequent surveillance imaging has demonstrated slow growth of the nodule consistent with recurrent disease.  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  She is now referred at the kind request of Dr. Julien Nordmann to discuss percutaneous thermal ablation.  Heather [name] is in her usual state of fairly good health.  She denies chest pain, shortness of breath or other active symptoms at this time.  She is very interested in pursuing treatment.  Past Medical History:  Diagnosis Date  . Arthritis   . Ascending aortic aneurysm ([place]) 07/20/2014   4.1 cm by CT  . Atrial fib/flutter, transient    post-op  . CHF (congestive heart failure) ([place])    a. EF 30-35% by echo in 2013 --> similar results by repeat imaging in 2016 and 11/2017.  Marland Kitchen Constipation    corrects with diet  . Floater, vitreous    Right  . Hypertension   . Nonsquamous nonsmall cell neoplasm of lung ([place]) 09/2011   Stage IB- s/p right upper lobectomy  . Pneumonia   . Psoriasis    Ankles  . Tobacco abuse     Past Surgical History:  Procedure Laterality Date  . CATARACT EXTRACTION Bilateral 2016  . COLONOSCOPY  01/22/2012   Procedure: COLONOSCOPY;  Surgeon: Danie Binder, MD;  Location: [facility];  Service: Endoscopy;   Laterality: N/A;  10:45  . COLONOSCOPY N/A 04/15/2017   Procedure: COLONOSCOPY;  Surgeon: Danie Binder, MD;  Location: [facility];  Service: Endoscopy;  Laterality: N/A;  10:30am  . IR RADIOLOGIST EVAL & MGMT  03/04/2018  . POLYPECTOMY  04/15/2017   Procedure: POLYPECTOMY;  Surgeon: Danie Binder, MD;  Location: [facility];  Service: Endoscopy;;  ascending colon  . right VATS (upper lobectomy)  08/29/11   Dr Roxan Hockey  . VIDEO BRONCHOSCOPY WITH ENDOBRONCHIAL NAVIGATION N/A 03/18/2015   Procedure: VIDEO BRONCHOSCOPY WITH ENDOBRONCHIAL NAVIGATION;  Surgeon: [name], MD;  Location: [facility] OR;  Service: Thoracic;  Laterality: N/A;    Allergies: Ibuprofen and Powder  Medications: Prior to Admission medications   Medication Sig Start Date End Date Taking? Authorizing Provider  alendronate (FOSAMAX) 70 MG tablet Take 70 mg by mouth every Monday.  07/06/14  Yes [provider]  calcium-vitamin D (OSCAL WITH D) 500-200 MG-UNIT tablet Take 1 tablet by mouth 3 (three) times daily.    [provider]  cyanocobalamin (,VITAMIN B-12,) 1000 MCG/ML injection Take by mouth.    [provider]  DOCOSAHEXAENOIC ACID PO Take 500 mg by mouth.    [provider]  glucosamine-chondroitin 500-400 MG tablet Take 1 tablet by mouth daily.    [provider]  hydrochlorothiazide (MICROZIDE) 12.5 MG capsule Take 12.5 mg by mouth daily as needed for fluid. 03/05/17   [provider]  meclizine (ANTIVERT) 12.5 MG tablet  Take 12.5 mg by mouth 2 (two) times daily as needed for dizziness.    [provider]  metoprolol succinate (TOPROL XL) 25 MG 24 hr tablet Take 1 tablet (25 mg total) by mouth 2 (two) times daily. 11/18/17   Herminio Commons, MD  Multiple Vitamins-Iron (MULTIVITAMINS WITH IRON) TABS Take 1 tablet by mouth daily.    [provider]  Omega-3 Fatty Acids (FISH OIL) 500 MG CAPS Take 500 mg by mouth daily.     [provider]  vitamin B-12 (CYANOCOBALAMIN) 500 MCG tablet Take 500 mcg by mouth daily.    [provider]     Family History  Problem Relation Age of Onset  . Hypertension Mother   . Heart disease Mother   . Heart disease Sister   . Heart disease Brother   . Colon cancer Neg Hx     Social History   Socioeconomic History  . Marital status: Widowed    Spouse name: Not on file  . Number of children: 3  . Years of education: Not on file  . Highest education level: Not on file  Occupational History  . Occupation: retired  Scientific laboratory technician  . Financial resource strain: Not on file  . Food insecurity:    Worry: Not on file    Inability: Not on file  . Transportation needs:    Medical: Not on file    Non-medical: Not on file  Tobacco Use  . Smoking status: Former Smoker    Packs/day: 0.30    Years: 20.00    Pack years: 6.00    Types: Cigarettes    Last attempt to quit: 01/12/2014    Years since quitting: 4.1  . Smokeless tobacco: Never Used  Substance and Sexual Activity  . Alcohol use: No  . Drug use: No  . Sexual activity: Not on file  Lifestyle  . Physical activity:    Days per week: Not on file    Minutes per session: Not on file  . Stress: Not on file  Relationships  . Social connections:    Talks on phone: Not on file    Gets together: Not on file    Attends religious service: Not on file    Active member of club or organization: Not on file    Attends meetings of clubs or organizations: Not on file    Relationship status: Not on file  Other Topics Concern  . Not on file  Social History Narrative   Lives w/ cousin    ECOG Status: 1 - Symptomatic but completely ambulatory  Review of Systems: A 12 point ROS discussed and pertinent positives are indicated in the HPI above.  All other systems are negative.  Review of Systems  Vital Signs: BP 127/81 (BP Location: Right Arm, Patient Position: Sitting, Cuff Size: Normal)   Pulse 60   Temp 98.6 F  (37 C)   Resp 18   Ht 5' 7.5" (1.715 m)   SpO2 100%   BMI 26.70 kg/m   Physical Exam  Constitutional: She is oriented to person, place, and time. She appears well-developed and well-nourished. No distress.  HENT:  Head: Normocephalic and atraumatic.  Eyes: No scleral icterus.  Cardiovascular: Normal rate.  Pulmonary/Chest: Effort normal.  Abdominal: Soft. She exhibits no distension. There is no tenderness.  Neurological: She is alert and oriented to person, place, and time.  Skin: Skin is warm and dry.  Psychiatric: She has a normal mood and affect.  Her behavior is normal.  Nursing note and vitals reviewed.    Imaging: Ct Chest W Contrast  Result Date: 02/17/2018 CLINICAL DATA:  Lung cancer staging. EXAM: CT CHEST WITH CONTRAST TECHNIQUE: Multidetector CT imaging of the chest was performed during intravenous contrast administration. CONTRAST:  20mL OMNIPAQUE IOHEXOL 300 MG/ML  SOLN COMPARISON:  10/10/2017 and PET 07/16/2017. FINDINGS: Cardiovascular: Atherosclerotic calcification of the arterial vasculature, including coronary arteries. Heart is enlarged. No pericardial effusion. Mediastinum/Nodes: No pathologically enlarged mediastinal, hilar or axillary lymph nodes. Esophagus is grossly unremarkable. Lungs/Pleura: Centrilobular emphysema. Postoperative scarring and volume loss in right upper and right lower lobes. Spiculated nodule in the medial left upper lobe measures 1.1 x 2.1 cm (series 5, image 52), increased from 1.3 x 1.7 cm on 10/10/2017. Numerous ground-glass nodules are seen bilaterally, as before. Index apical left upper lobe nodule measures 0.9 x 1.6 cm (image 29), as before. No pleural fluid. Airway is unremarkable. Upper Abdomen: Visualized portions of the liver and adrenal glands are unremarkable. Scarring in the kidneys bilaterally. Low-attenuation lesions in the kidneys measure up to 1.5 cm, as before, but too small to characterize. Visualized portions of the spleen,  pancreas, stomach and bowel are grossly unremarkable. No upper abdominal adenopathy. Musculoskeletal: Degenerative changes in the spine. No worrisome lytic or sclerotic lesions. IMPRESSION: 1. Enlarging solid nodule in the medial left upper lobe, worrisome for invasive primary bronchogenic carcinoma. 2. Multiple bilateral ground-glass pulmonary nodules, as before, 2 of which showed hypermetabolism on 07/16/2017. 3. Aortic atherosclerosis (ICD10-170.0). Coronary artery calcification. 4.  Emphysema (ICD10-J43.9). Electronically Signed   By: Lorin Picket M.D.   On: 02/17/2018 13:30   Ir Radiologist Eval & Mgmt  Result Date: 03/04/2018 Please refer to notes tab for details about interventional procedure. (Op Note)   Labs:  CBC: Recent Labs    07/25/17 0856 08/19/17 1212 02/17/18 0947  WBC 6.7 7.3 6.6  HGB 12.8 12.8 11.9  HCT 39.7 38.8 36.3  PLT 196 182 169    COAGS: Recent Labs    07/25/17 0856  INR 1.04  APTT 34    BMP: Recent Labs    08/19/17 1212 10/10/17 1214 02/17/18 0947  NA 141  --  142  K 3.9  --  3.6  CL 103  --  104  CO2 29  --  28  GLUCOSE 91  --  96  BUN 12  --  11  CALCIUM 9.2  --  9.8  CREATININE 0.99 1.10* 0.98  GFRNONAA 54*  --  54*  GFRAA >60  --  >60    LIVER FUNCTION TESTS: Recent Labs    08/19/17 1212 02/17/18 0947  BILITOT 1.0 1.0  AST 20 18  ALT 9 11  ALKPHOS 70 55  PROT 7.3 7.1  ALBUMIN 3.9 3.9    TUMOR MARKERS: No results for input(s): AFPTM, CEA, CA199, CHROMGRNA in the last 8760 hours.  Assessment and Plan:  77 year old female with recurrent non-small cell lung cancer involving the left upper lobe status post stereotactic radiotherapy in February 2019.  We discussed the risks, benefits and alternatives to percutaneous thermal ablation.  She understands the risks include pneumothorax requiring chest tube placement, hemoptysis, and potentially bronchopleural fistula.  She also understands that the goal of therapy is to achieve  local control of this lesion.  She has numerous additional groundglass attenuation airspace opacities which may represent low-grade adenocarcinoma, or adenocarcinoma in situ.  Any of these sites may become problematic for her in the future.  She understands and desires to proceed at the next possible appointment time.  She is anxious to be treated.  1.)  We will schedule for percutaneous thermal ablation (microwave) to be performed at [facility].  Thank you for this interesting consult.  I greatly enjoyed meeting Heather [name] and look forward to participating in their care.  A copy of this report was sent to the requesting provider on this date.  Electronically Signed: Jacqulynn Cadet 03/04/2018, 11:03 AM   I spent a total of  40 Minutes  in face to face in clinical consultation, greater than 50% of which was counseling/coordinating care for Recurrent LUL lung cancer, non small cell

## 2018-03-06 NOTE — Patient Instructions (Signed)
Heather [name]  03/06/2018   Your procedure is scheduled on: 03-19-18   Report to [facility] Main  Entrance    Report to admitting at 6:45AM    Call this number if you have problems the morning of surgery [phone]    Remember: Do not eat food or drink liquids :After Midnight.     Take these medicines the morning of surgery with A SIP OF WATER: metoprolol                                 You may not have any metal on your body including hair pins and              piercings  Do not wear jewelry, make-up, lotions, powders or perfumes, deodorant             Do not wear nail polish.  Do not shave  48 hours prior to surgery.     Do not bring valuables to the hospital. [place].  Contacts, dentures or bridgework may not be worn into surgery.      Patients discharged the day of surgery will not be allowed to drive home.  Name and phone number of your driver:  Special Instructions: N/A              Please read over the following fact sheets you were given: _____________________________________________________________________             [facility] - Preparing for Surgery Before surgery, you can play an important role.  Because skin is not sterile, your skin needs to be as free of germs as possible.  You can reduce the number of germs on your skin by washing with CHG (chlorahexidine gluconate) soap before surgery.  CHG is an antiseptic cleaner which kills germs and bonds with the skin to continue killing germs even after washing. Please DO NOT use if you have an allergy to CHG or antibacterial soaps.  If your skin becomes reddened/irritated stop using the CHG and inform your nurse when you arrive at Short Stay. Do not shave (including legs and underarms) for at least 48 hours prior to the first CHG shower.  You may shave your face/neck. Please follow these instructions carefully:  1.  Shower with CHG  Soap the night before surgery and the  morning of Surgery.  2.  If you choose to wash your hair, wash your hair first as usual with your  normal  shampoo.  3.  After you shampoo, rinse your hair and body thoroughly to remove the  shampoo.                           4.  Use CHG as you would any other liquid soap.  You can apply chg directly  to the skin and wash                       Gently with a scrungie or clean washcloth.  5.  Apply the CHG Soap to your body ONLY FROM THE NECK DOWN.   Do not use on face/ open  Wound or open sores. Avoid contact with eyes, ears mouth and genitals (private parts).                       Wash face,  Genitals (private parts) with your normal soap.             6.  Wash thoroughly, paying special attention to the area where your surgery  will be performed.  7.  Thoroughly rinse your body with warm water from the neck down.  8.  DO NOT shower/wash with your normal soap after using and rinsing off  the CHG Soap.                9.  Pat yourself dry with a clean towel.            10.  Wear clean pajamas.            11.  Place clean sheets on your bed the night of your first shower and do not  sleep with pets. Day of Surgery : Do not apply any lotions/deodorants the morning of surgery.  Please wear clean clothes to the hospital/surgery center.  FAILURE TO FOLLOW THESE INSTRUCTIONS MAY RESULT IN THE CANCELLATION OF YOUR SURGERY PATIENT SIGNATURE_________________________________  NURSE SIGNATURE__________________________________  ________________________________________________________________________

## 2018-03-06 NOTE — Progress Notes (Signed)
Lov cards , [place], [place] 02-07-18 epic   Francis Gaines, cmp 02-17-18 epic   Echo 11-21-17 epic   ekg 11-18-17 epic   LOV cardiothoracic Dr Roxan Hockey 06-04-17 epic   CT chest 06-04-17 epic

## 2018-03-12 ENCOUNTER — Encounter (HOSPITAL_COMMUNITY): Payer: Self-pay

## 2018-03-12 ENCOUNTER — Encounter (HOSPITAL_COMMUNITY)
Admission: RE | Admit: 2018-03-12 | Discharge: 2018-03-12 | Disposition: A | Discharge status: Home / Self Care | Payer: Medicare Other | Source: Ambulatory Visit | Attending: Interventional Radiology | Admitting: Interventional Radiology

## 2018-03-12 ENCOUNTER — Other Ambulatory Visit: Payer: Self-pay

## 2018-03-12 DIAGNOSIS — Z01812 Encounter for preprocedural laboratory examination: Secondary | ICD-10-CM | POA: Diagnosis not present

## 2018-03-12 LAB — CBC
HCT: 38 % (ref 36.0–46.0)
Hemoglobin: 12.7 g/dL (ref 12.0–15.0)
MCH: 30.9 pg (ref 26.0–34.0)
MCHC: 33.4 g/dL (ref 30.0–36.0)
MCV: 92.5 fL (ref 78.0–100.0)
PLATELETS: 205 10*3/uL (ref 150–400)
RBC: 4.11 MIL/uL (ref 3.87–5.11)
RDW: 14 % (ref 11.5–15.5)
WBC: 6.3 10*3/uL (ref 4.0–10.5)

## 2018-03-12 LAB — BASIC METABOLIC PANEL
Anion gap: 8 (ref 5–15)
BUN: 15 mg/dL (ref 8–23)
CHLORIDE: 104 mmol/L (ref 98–111)
CO2: 31 mmol/L (ref 22–32)
CREATININE: 0.98 mg/dL (ref 0.44–1.00)
Calcium: 10.1 mg/dL (ref 8.9–10.3)
GFR calc Af Amer: >60 mL/min (ref >60)
GFR calc non Af Amer: 54 mL/min — ABNORMAL LOW (ref >60)
Glucose, Bld: 96 mg/dL (ref 70–99)
Potassium: 3.9 mmol/L (ref 3.5–5.1)
Sodium: 143 mmol/L (ref 135–145)

## 2018-03-18 ENCOUNTER — Other Ambulatory Visit: Payer: Self-pay | Admitting: Radiology

## 2018-03-19 ENCOUNTER — Encounter (HOSPITAL_COMMUNITY)
Admission: RE | Disposition: A | Discharge status: Home / Self Care | Payer: Self-pay | Source: Ambulatory Visit | Attending: Interventional Radiology

## 2018-03-19 ENCOUNTER — Ambulatory Visit (HOSPITAL_COMMUNITY)
Admission: RE | Admit: 2018-03-19 | Discharge: 2018-03-19 | Disposition: A | Discharge status: Home / Self Care | Payer: Medicare Other | Source: Ambulatory Visit | Attending: Interventional Radiology | Admitting: Interventional Radiology

## 2018-03-19 ENCOUNTER — Encounter (HOSPITAL_COMMUNITY): Payer: Self-pay | Admitting: General Practice

## 2018-03-19 ENCOUNTER — Ambulatory Visit (HOSPITAL_COMMUNITY): Payer: Medicare Other | Admitting: Anesthesiology

## 2018-03-19 DIAGNOSIS — I4891 Unspecified atrial fibrillation: Secondary | ICD-10-CM | POA: Insufficient documentation

## 2018-03-19 DIAGNOSIS — Z923 Personal history of irradiation: Secondary | ICD-10-CM | POA: Diagnosis not present

## 2018-03-19 DIAGNOSIS — Z886 Allergy status to analgesic agent status: Secondary | ICD-10-CM | POA: Insufficient documentation

## 2018-03-19 DIAGNOSIS — C3490 Malignant neoplasm of unspecified part of unspecified bronchus or lung: Secondary | ICD-10-CM | POA: Diagnosis not present

## 2018-03-19 DIAGNOSIS — Z87891 Personal history of nicotine dependence: Secondary | ICD-10-CM | POA: Diagnosis not present

## 2018-03-19 DIAGNOSIS — M1990 Unspecified osteoarthritis, unspecified site: Secondary | ICD-10-CM | POA: Insufficient documentation

## 2018-03-19 DIAGNOSIS — I509 Heart failure, unspecified: Secondary | ICD-10-CM | POA: Diagnosis not present

## 2018-03-19 DIAGNOSIS — Z9841 Cataract extraction status, right eye: Secondary | ICD-10-CM | POA: Insufficient documentation

## 2018-03-19 DIAGNOSIS — I712 Thoracic aortic aneurysm, without rupture: Secondary | ICD-10-CM | POA: Diagnosis not present

## 2018-03-19 DIAGNOSIS — Z79899 Other long term (current) drug therapy: Secondary | ICD-10-CM | POA: Diagnosis not present

## 2018-03-19 DIAGNOSIS — R918 Other nonspecific abnormal finding of lung field: Secondary | ICD-10-CM | POA: Diagnosis not present

## 2018-03-19 DIAGNOSIS — I4892 Unspecified atrial flutter: Secondary | ICD-10-CM | POA: Diagnosis not present

## 2018-03-19 DIAGNOSIS — Z538 Procedure and treatment not carried out for other reasons: Secondary | ICD-10-CM | POA: Insufficient documentation

## 2018-03-19 DIAGNOSIS — L409 Psoriasis, unspecified: Secondary | ICD-10-CM | POA: Diagnosis not present

## 2018-03-19 DIAGNOSIS — Z85118 Personal history of other malignant neoplasm of bronchus and lung: Secondary | ICD-10-CM | POA: Diagnosis not present

## 2018-03-19 DIAGNOSIS — Z86010 Personal history of colonic polyps: Secondary | ICD-10-CM | POA: Diagnosis not present

## 2018-03-19 DIAGNOSIS — Y842 Radiological procedure and radiotherapy as the cause of abnormal reaction of the patient, or of later complication, without mention of misadventure at the time of the procedure: Secondary | ICD-10-CM | POA: Diagnosis not present

## 2018-03-19 DIAGNOSIS — I700 Atherosclerosis of aorta: Secondary | ICD-10-CM | POA: Insufficient documentation

## 2018-03-19 DIAGNOSIS — K5900 Constipation, unspecified: Secondary | ICD-10-CM | POA: Diagnosis not present

## 2018-03-19 DIAGNOSIS — J701 Chronic and other pulmonary manifestations due to radiation: Secondary | ICD-10-CM | POA: Diagnosis not present

## 2018-03-19 DIAGNOSIS — I110 Hypertensive heart disease with heart failure: Secondary | ICD-10-CM | POA: Diagnosis not present

## 2018-03-19 DIAGNOSIS — C3412 Malignant neoplasm of upper lobe, left bronchus or lung: Secondary | ICD-10-CM | POA: Diagnosis not present

## 2018-03-19 DIAGNOSIS — Z9842 Cataract extraction status, left eye: Secondary | ICD-10-CM | POA: Diagnosis not present

## 2018-03-19 HISTORY — PX: RADIOLOGY WITH ANESTHESIA: SHX6223

## 2018-03-19 LAB — CBC WITH DIFFERENTIAL/PLATELET
BASOS ABS: 0 10*3/uL (ref 0.0–0.1)
Basophils Relative: 0 %
Eosinophils Absolute: 0.2 10*3/uL (ref 0.0–0.7)
Eosinophils Relative: 2 %
HEMATOCRIT: 34.6 % — AB (ref 36.0–46.0)
HEMOGLOBIN: 11.7 g/dL — AB (ref 12.0–15.0)
LYMPHS PCT: 48 %
Lymphs Abs: 3.4 10*3/uL (ref 0.7–4.0)
MCH: 31.3 pg (ref 26.0–34.0)
MCHC: 33.8 g/dL (ref 30.0–36.0)
MCV: 92.5 fL (ref 78.0–100.0)
MONO ABS: 0.5 10*3/uL (ref 0.1–1.0)
Monocytes Relative: 8 %
NEUTROS ABS: 3 10*3/uL (ref 1.7–7.7)
NEUTROS PCT: 42 %
Platelets: 172 10*3/uL (ref 150–400)
RBC: 3.74 MIL/uL — ABNORMAL LOW (ref 3.87–5.11)
RDW: 14.1 % (ref 11.5–15.5)
WBC: 7 10*3/uL (ref 4.0–10.5)

## 2018-03-19 LAB — TYPE AND SCREEN
ABO/RH(D): O POS
Antibody Screen: NEGATIVE

## 2018-03-19 LAB — COMPREHENSIVE METABOLIC PANEL
ALK PHOS: 50 U/L (ref 38–126)
ALT: 9 U/L (ref 0–44)
AST: 25 U/L (ref 15–41)
Albumin: 3.9 g/dL (ref 3.5–5.0)
Anion gap: 10 (ref 5–15)
BILIRUBIN TOTAL: 0.9 mg/dL (ref 0.3–1.2)
BUN: 11 mg/dL (ref 8–23)
CALCIUM: 9.3 mg/dL (ref 8.9–10.3)
CO2: 28 mmol/L (ref 22–32)
CREATININE: 0.97 mg/dL (ref 0.44–1.00)
Chloride: 104 mmol/L (ref 98–111)
GFR calc Af Amer: >60 mL/min (ref >60)
GFR, EST NON AFRICAN AMERICAN: 55 mL/min — AB (ref >60)
Glucose, Bld: 95 mg/dL (ref 70–99)
Potassium: 3.7 mmol/L (ref 3.5–5.1)
Sodium: 142 mmol/L (ref 135–145)
Total Protein: 6.8 g/dL (ref 6.5–8.1)

## 2018-03-19 LAB — PROTIME-INR
INR: 1.09
PROTHROMBIN TIME: 14 s (ref 11.4–15.2)

## 2018-03-19 LAB — ABO/RH: ABO/RH(D): O POS

## 2018-03-19 SURGERY — CT WITH ANESTHESIA
Anesthesia: Monitor Anesthesia Care

## 2018-03-19 MED ORDER — CEFAZOLIN SODIUM-DEXTROSE 2-4 GM/100ML-% IV SOLN
2.0000 g | INTRAVENOUS | Status: AC
  Administered 2018-03-19: 2 g via INTRAVENOUS

## 2018-03-19 MED ORDER — LACTATED RINGERS IV SOLN
INTRAVENOUS | Status: DC
  Administered 2018-03-19 (×2): via INTRAVENOUS

## 2018-03-19 MED ORDER — FENTANYL CITRATE (PF) 250 MCG/5ML IJ SOLN
INTRAMUSCULAR | Status: AC
  Filled 2018-03-19: qty 5

## 2018-03-19 MED ORDER — ONDANSETRON HCL 4 MG/2ML IJ SOLN: 4.0000 mg | Freq: Once | INTRAMUSCULAR | Status: DC | PRN

## 2018-03-19 MED ORDER — GLYCOPYRROLATE 0.2 MG/ML IJ SOLN
INTRAMUSCULAR | Status: DC | PRN
  Administered 2018-03-19: 0.2 mg via INTRAVENOUS

## 2018-03-19 MED ORDER — SODIUM CHLORIDE 0.9 % IV SOLN
INTRAVENOUS | Status: DC | PRN
  Administered 2018-03-19: 25 ug/min via INTRAVENOUS

## 2018-03-19 MED ORDER — REMIFENTANIL HCL 2 MG IV SOLR
0.5000 ug/kg | INTRAVENOUS | Status: DC
  Filled 2018-03-19: qty 2000

## 2018-03-19 MED ORDER — MIDAZOLAM HCL 2 MG/2ML IJ SOLN
INTRAMUSCULAR | Status: AC
  Filled 2018-03-19: qty 2

## 2018-03-19 MED ORDER — MIDAZOLAM HCL 5 MG/5ML IJ SOLN
INTRAMUSCULAR | Status: DC | PRN
  Administered 2018-03-19: 1 mg via INTRAVENOUS

## 2018-03-19 MED ORDER — FENTANYL CITRATE (PF) 100 MCG/2ML IJ SOLN: 25.0000 ug | INTRAMUSCULAR | Status: DC | PRN

## 2018-03-19 MED ORDER — PROPOFOL 500 MG/50ML IV EMUL
INTRAVENOUS | Status: DC | PRN
  Administered 2018-03-19: 100 ug/kg/min via INTRAVENOUS

## 2018-03-19 MED ORDER — FENTANYL CITRATE (PF) 100 MCG/2ML IJ SOLN
INTRAMUSCULAR | Status: DC | PRN
  Administered 2018-03-19 (×3): 50 ug via INTRAVENOUS

## 2018-03-19 MED ORDER — VANCOMYCIN HCL IN DEXTROSE 1-5 GM/200ML-% IV SOLN
1000.0000 mg | INTRAVENOUS | Status: AC
  Administered 2018-03-19: 1000 mg via INTRAVENOUS
  Filled 2018-03-19 (×2): qty 200

## 2018-03-19 MED ORDER — ACETAMINOPHEN 10 MG/ML IV SOLN: 1000.0000 mg | Freq: Once | INTRAVENOUS | Status: DC | PRN

## 2018-03-19 NOTE — Progress Notes (Signed)
Vancomycin stopped per Dr. Laurence Ferrari and Rowe Robert, PA. Ancef to be given.

## 2018-03-19 NOTE — Progress Notes (Signed)
Pacu   Pt in recovery , order to discharge home , Dr Laurence Ferrari spoke to pt and explained  No need for admission to the hospital since that was no intervention or procedure done.  Family not in the waiting rorm , unable to reach son on the phone.  Called the other contact  Yet unable to transport pt home.  Pt still in recovery waiting for family .

## 2018-03-19 NOTE — Procedures (Signed)
Interventional Radiology Procedure Note  Procedure: Aborted percutaneous thermal ablation.   Complications: None  Estimated Blood Loss: None  Recommendations: - Give good news to patient and family - DC home  Signed,  Criselda Peaches, MD

## 2018-03-19 NOTE — Anesthesia Preprocedure Evaluation (Addendum)
Anesthesia Evaluation  Patient identified by MRN, date of birth, ID band Patient awake    Reviewed: Allergy & Precautions, NPO status , Patient's Chart, lab work & pertinent test results  Airway Mallampati: II  TM Distance: >3 FB Neck ROM: Full    Dental no notable dental hx. (+) Dental Advisory Given, Edentulous Upper, Edentulous Lower   Pulmonary former smoker,    Pulmonary exam normal breath sounds clear to auscultation       Cardiovascular hypertension, +CHF  Normal cardiovascular exam+ dysrhythmias Atrial Fibrillation  Rhythm:Regular Rate:Normal  Echo 11/21/2017 Left ventricle: The cavity size was mildly dilated. Wall   thickness was normal. Systolic function was moderately to   severely reduced. The estimated ejection fraction was in the   range of 30% to 35%. Diffuse hypokinesis. Doppler parameters are   consistent with abnormal left ventricular relaxation (grade 1   diastolic dysfunction). - Regional wall motion abnormality: Hypokinesis of the basal   inferolateral myocardium.   Neuro/Psych negative neurological ROS  negative psych ROS   GI/Hepatic negative GI ROS, Neg liver ROS,   Endo/Other    Renal/GU      Musculoskeletal  (+) Arthritis ,   Abdominal   Peds  Hematology   Anesthesia Other Findings   Reproductive/Obstetrics                            Anesthesia Physical Anesthesia Plan  ASA: IV  Anesthesia Plan: MAC   Post-op Pain Management:    Induction: Intravenous  PONV Risk Score and Plan: Treatment may vary due to age or medical condition  Airway Management Planned: Natural Airway and Nasal Cannula  Additional Equipment:   Intra-op Plan:   Post-operative Plan:   Informed Consent: I have reviewed the patients History and Physical, chart, labs and discussed the procedure including the risks, benefits and alternatives for the proposed anesthesia with the patient  or authorized representative who has indicated his/her understanding and acceptance.   Dental advisory given  Plan Discussed with: CRNA  Anesthesia Plan Comments:        Anesthesia Quick Evaluation

## 2018-03-19 NOTE — Discharge Summary (Signed)
Patient ID: Heather [name] MRN: [id number] DOB/AGE: [birth year] 77 y.o.  Admit date: 03/19/2018 Discharge date: 03/19/2018  Supervising Physician: Heather [name]  Patient Status: [facility] OP  Admission Diagnoses: lung cancer  Discharge Diagnoses: lung cancer with latest CT chest revealing resolution of recently diagnosed stage Ia non-small cell lung cancer in left upper lobe. Active Problems:   * No active [name] problems. *  Past Medical History:  Diagnosis Date  . Arthritis    reports her joints are fine and she has no pain   . Ascending aortic aneurysm ([name]) 07/20/2014   4.1 cm by CT  . Atrial fib/flutter, transient    post-op  . CHF (congestive heart failure) ([place])    a. EF 30-35% by echo in 2013 --> similar results by repeat imaging in 2016 and 11/2017.  Marland Kitchen Constipation    corrects with diet  . Floater, vitreous    Right  . Hypertension   . Nonsquamous nonsmall cell neoplasm of lung ([name]) 09/2011   Stage IB- s/p right upper lobectomy  . Pneumonia   . Psoriasis    Ankles  . Tobacco abuse    Past Surgical History:  Procedure Laterality Date  . CATARACT EXTRACTION Bilateral 2016  . COLONOSCOPY  01/22/2012   Procedure: COLONOSCOPY;  Surgeon: Heather Binder, MD;  [facility];  Service: Endoscopy;  Laterality: N/A;  10:45  . COLONOSCOPY N/A 04/15/2017   Procedure: COLONOSCOPY;  Surgeon: Heather Binder, MD;  [facility];  Service: Endoscopy;  Laterality: N/A;  10:30am  . IR RADIOLOGIST EVAL & MGMT  03/04/2018  . POLYPECTOMY  04/15/2017   Procedure: POLYPECTOMY;  Surgeon: Heather Binder, MD;  [facility];  Service: Endoscopy;;  ascending colon  . right VATS (upper lobectomy)  08/29/11   Dr Heather [name]  . VIDEO BRONCHOSCOPY WITH ENDOBRONCHIAL NAVIGATION N/A 03/18/2015   Procedure: VIDEO BRONCHOSCOPY WITH ENDOBRONCHIAL NAVIGATION;  Surgeon: Heather Nakayama, MD;  Location: [name];  Service: Thoracic;  Laterality: N/A;     Discharged  Condition: good  [name] Course: Heather [name] a 77 y.o.femaleformer smoker with a history of recurrent stage Ia left upper lobe non-small cell bronchogenic carcinoma. She was initially diagnosed with right-sided lung cancer in March 78 and underwent definitive right upper lobectomy with lymph node dissection. Unfortunately, she was diagnosed with a second primary lesion, this time in the left upper lobe in early 2019. She underwent definitive stereotactic radiotherapy in February 2019. Subsequent surveillance imaging has demonstrated slow growth of the nodule consistent with recurrent disease.  She was recently seen in consultation by Dr. Laurence [name] on 03/04/2018 and deemed an appropriate candidate for CT-guided thermal ablation of the left upper lobe lung cancer and presented 03/19/77 for the procedure.  Follow-up limited chest CT performed on 03/19/18 prior to planned procedure revealed interval resolution of the nodular opacity in region of the previously diagnosed stage Ia non-small cell lung cancer in the left upper lobe of the lung.  Percutaneous thermal ablation was therefore not performed.  Findings were discussed with Heather [name] and patient will be discharged home from PACU.  She is currently stable and without complaints.  Consults: none  Significant Diagnostic Studies:  Results for orders placed or performed during the [name] encounter of 03/19/18  CBC with Differential/Platelet  Result Value Ref Range   WBC 7.0 4.0 - 10.5 K/uL   RBC 3.74 (L) 3.87 - 5.11 MIL/uL   Hemoglobin 11.7 (L) 12.0 - 15.0 g/dL  HCT 34.6 (L) 36.0 - 46.0 %   MCV 92.5 78.0 - 100.0 fL   MCH 31.3 26.0 - 34.0 pg   MCHC 33.8 30.0 - 36.0 g/dL   RDW 14.1 11.5 - 15.5 %   Platelets 172 150 - 400 K/uL   Neutrophils Relative % 42 %   Neutro Abs 3.0 1.7 - 7.7 K/uL   Lymphocytes Relative 48 %   Lymphs Abs 3.4 0.7 - 4.0 K/uL   Monocytes Relative 8 %   Monocytes Absolute 0.5 0.1 - 1.0 K/uL   Eosinophils  Relative 2 %   Eosinophils Absolute 0.2 0.0 - 0.7 K/uL   Basophils Relative 0 %   Basophils Absolute 0.0 0.0 - 0.1 K/uL  Comprehensive metabolic panel  Result Value Ref Range   Sodium 142 135 - 145 mmol/L   Potassium 3.7 3.5 - 5.1 mmol/L   Chloride 104 98 - 111 mmol/L   CO2 28 22 - 32 mmol/L   Glucose, Bld 95 70 - 99 mg/dL   BUN 11 8 - 23 mg/dL   Creatinine, Ser 0.97 0.44 - 1.00 mg/dL   Calcium 9.3 8.9 - 10.3 mg/dL   Total Protein 6.8 6.5 - 8.1 g/dL   Albumin 3.9 3.5 - 5.0 g/dL   AST 25 15 - 41 U/L   ALT 9 0 - 44 U/L   Alkaline Phosphatase 50 38 - 126 U/L   Total Bilirubin 0.9 0.3 - 1.2 mg/dL   GFR calc non Af Amer 55 (L) >60 mL/min   GFR calc Af Amer >60 >60 mL/min   Anion gap 10 5 - 15  Protime-INR  Result Value Ref Range   Prothrombin Time 14.0 11.4 - 15.2 seconds   INR 1.09   Type and screen  Result Value Ref Range   ABO/RH(D) O POS    Antibody Screen NEG    Sample Expiration      03/22/2018 Performed at [name], [place] [address], [name], [name] [address]   ABO/Rh  Result Value Ref Range   ABO/RH(D)      O POS Performed at [name] [address] [name], [name] [address]      Treatments: none other than follow-up CT chest  Discharge Exam: Blood pressure (!) 147/85, pulse 61, temperature 97.9 F (36.6 C), resp. rate 10, height 5\' 7"  (1.702 m), weight 175 lb (79.4 kg), SpO2 99 %. Awake, alert.  Chest clear to auscultation bilaterally.   Heart with regular rate and rhythm.  Abdomen soft, positive bowel sounds, nontender.  No significant lower extremity edema.  Disposition: Discharge disposition: 01-Home or Self Care       Discharge Instructions    Call MD for:  difficulty breathing, headache or visual disturbances   Complete by:  As directed    Call MD for:  extreme fatigue   Complete by:  As directed    Call MD for:  hives   Complete by:  As directed    Call MD for:  persistant dizziness or  light-headedness   Complete by:  As directed    Call MD for:  persistant nausea and vomiting   Complete by:  As directed    Call MD for:  redness, tenderness, or signs of infection (pain, swelling, redness, odor or green/yellow discharge around incision site)   Complete by:  As directed    Call MD for:  severe uncontrolled pain   Complete by:  As directed    Call  MD for:  temperature >100.4   Complete by:  As directed    Diet - low sodium heart healthy   Complete by:  As directed    Driving Restrictions   Complete by:  As directed    No driving for next 24 hours   Increase activity slowly   Complete by:  As directed      Allergies as of 03/19/2018      Reactions   Ibuprofen Nausea And Vomiting   Powder Rash   Gloves with powder      Medication List    TAKE these medications   alendronate 70 MG tablet Commonly known as:  FOSAMAX Take 70 mg by mouth every Monday.   calcium-vitamin D 500-200 MG-UNIT tablet Commonly known as:  OSCAL WITH D Take 1 tablet by mouth 3 (three) times daily.   Fish Oil 500 MG Caps Take 500 mg by mouth daily.   glucosamine-chondroitin 500-400 MG tablet Take 1 tablet by mouth daily.   hydrochlorothiazide 12.5 MG capsule Commonly known as:  MICROZIDE Take 12.5 mg by mouth daily at 2 PM.   meclizine 12.5 MG tablet Commonly known as:  ANTIVERT Take 12.5 mg by mouth daily at 12 noon.   metoprolol succinate 25 MG 24 hr tablet Commonly known as:  TOPROL-XL Take 1 tablet (25 mg total) by mouth 2 (two) times daily.   multivitamins with iron Tabs tablet Take 1 tablet by mouth daily.   vitamin B-12 500 MCG tablet Commonly known as:  CYANOCOBALAMIN Take 500 mcg by mouth daily.      Follow-up Information    Curt Bears, MD Follow up.   Specialty:  Oncology Why:  Follow-up with Heather [name] as scheduled Contact information: [place] [address] [phone]            Electronically Signed: D. Rowe Robert, PA-C 03/19/2018, 12:06 PM   I have spent Less Than 30 Minutes discharging Heather [name].

## 2018-03-19 NOTE — Anesthesia Postprocedure Evaluation (Signed)
Anesthesia Post Note  Patient: Heather [name]  Procedure(s) Performed: CT MICROWAVE THERMAL ABLATION WITH ANESTHESIA (N/A )     Patient location during evaluation: PACU Anesthesia Type: MAC Level of consciousness: awake and alert Pain management: pain level controlled Vital Signs Assessment: post-procedure vital signs reviewed and stable Respiratory status: spontaneous breathing, nonlabored ventilation, respiratory function stable and patient connected to nasal cannula oxygen Cardiovascular status: stable and blood pressure returned to baseline Postop Assessment: no apparent nausea or vomiting Anesthetic complications: no    Last Vitals:  Vitals:   03/19/18 1115 03/19/18 1242  BP: (!) 147/85 (!) 155/95  Pulse: 61 (!) 57  Resp: 10 20  Temp:    SpO2: 99% 100%    Last Pain:  Vitals:   03/19/18 1242  TempSrc:   PainSc: 0-No pain                 Barnet Glasgow

## 2018-03-19 NOTE — Anesthesia Procedure Notes (Signed)
Performed by: Lissa Morales, CRNA

## 2018-03-19 NOTE — Transfer of Care (Addendum)
Immediate Anesthesia Transfer of Care Note  Patient: Heather [name]  Procedure(s) Performed: CT MICROWAVE THERMAL ABLATION WITH ANESTHESIA (N/A )  Patient Location: PACU  Anesthesia Type:MAC  Level of Consciousness: awake, alert , oriented and patient cooperative  Airway & Oxygen Therapy: Patient Spontanous Breathing and Patient connected to face mask oxygen  Post-op Assessment: Report given to RN, Post -op Vital signs reviewed and stable and Patient moving all extremities X 4  Post vital signs: stable  Last Vitals:  Vitals Value Taken Time  BP 131/75 03/19/2018 10:30 AM  Temp    Pulse 60 03/19/2018 10:32 AM  Resp 13 03/19/2018 10:32 AM  SpO2 100 % 03/19/2018 10:32 AM  Vitals shown include unvalidated device data.  Last Pain:  Vitals:   03/19/18 0642  TempSrc: Oral         Complications: No apparent anesthesia complications

## 2018-03-19 NOTE — Anesthesia Procedure Notes (Deleted)
Procedure Name: MAC Date/Time: 03/19/2018 9:09 AM Performed by: Lissa Morales, CRNA Pre-anesthesia Checklist: Patient identified, Emergency Drugs available, Suction available, Patient being monitored and Timeout performed Patient Re-evaluated:Patient Re-evaluated prior to induction Oxygen Delivery Method: Simple face mask Placement Confirmation: positive ETCO2

## 2018-03-19 NOTE — H&P (Signed)
Referring Physician(s): Mohamed,M  Supervising Physician: Jacqulynn Cadet  Patient Status: [facility] OP TBA  Chief Complaint:  Lung cancer  Subjective: Heather [name] is a 77 y.o. female former smoker with a history of recurrent stage Ia left upper lobe non-small cell bronchogenic carcinoma.  She was initially diagnosed with right-sided lung cancer in March 2013 and underwent definitive right upper lobectomy with lymph node dissection.  Unfortunately, she was diagnosed with a second primary lesion, this time in the left upper lobe in early 2019.  She underwent definitive stereotactic radiotherapy in February 2019.  Subsequent surveillance imaging has demonstrated slow growth of the nodule consistent with recurrent disease.  She was recently seen in consultation by Dr. Laurence Ferrari on 03/04/2018 and deemed an appropriate candidate for CT-guided thermal ablation of the left upper lobe lung cancer and presents today for the procedure.  She currently denies fever, headache, chest pain, dyspnea, cough, abdominal/back pain, nausea, vomiting or bleeding.  Past Medical History:  Diagnosis Date  . Arthritis    reports her joints are fine and she has no pain   . Ascending aortic aneurysm ([place]) 07/20/2014   4.1 cm by CT  . Atrial fib/flutter, transient    post-op  . CHF (congestive heart failure) ([place])    a. EF 30-35% by echo in 2013 --> similar results by repeat imaging in 2016 and 11/2017.  Marland Kitchen Constipation    corrects with diet  . Floater, vitreous    Right  . Hypertension   . Nonsquamous nonsmall cell neoplasm of lung ([place]) 09/2011   Stage IB- s/p right upper lobectomy  . Pneumonia   . Psoriasis    Ankles  . Tobacco abuse    Past Surgical History:  Procedure Laterality Date  . CATARACT EXTRACTION Bilateral 2016  . COLONOSCOPY  01/22/2012   Procedure: COLONOSCOPY;  Surgeon: Danie Binder, MD;  [facility];  Service: Endoscopy;  Laterality: N/A;  10:45  . COLONOSCOPY N/A  04/15/2017   Procedure: COLONOSCOPY;  Surgeon: Danie Binder, MD;  [facility];  Service: Endoscopy;  Laterality: N/A;  10:30am  . IR RADIOLOGIST EVAL & MGMT  03/04/2018  . POLYPECTOMY  04/15/2017   Procedure: POLYPECTOMY;  Surgeon: Danie Binder, MD;  [facility];  Service: Endoscopy;;  ascending colon  . right VATS (upper lobectomy)  08/29/11   Dr Roxan Hockey  . VIDEO BRONCHOSCOPY WITH ENDOBRONCHIAL NAVIGATION N/A 03/18/2015   Procedure: VIDEO BRONCHOSCOPY WITH ENDOBRONCHIAL NAVIGATION;  Surgeon: [name], MD;  Location: [facility];  Service: Thoracic;  Laterality: N/A;       Allergies: Ibuprofen and Powder  Medications: Prior to Admission medications   Medication Sig Start Date End Date Taking? Authorizing Provider  alendronate (FOSAMAX) 70 MG tablet Take 70 mg by mouth every Monday.  07/06/14  Yes [provider]  calcium-vitamin D (OSCAL WITH D) 500-200 MG-UNIT tablet Take 1 tablet by mouth 3 (three) times daily.   Yes [provider]  glucosamine-chondroitin 500-400 MG tablet Take 1 tablet by mouth daily.   Yes [provider]  hydrochlorothiazide (MICROZIDE) 12.5 MG capsule Take 12.5 mg by mouth daily at 2 PM.  03/05/17  Yes [provider]  meclizine (ANTIVERT) 12.5 MG tablet Take 12.5 mg by mouth daily at 12 noon.    Yes [provider]  metoprolol succinate (TOPROL XL) 25 MG 24 hr tablet Take 1 tablet (25 mg total) by mouth 2 (two) times daily. 11/18/17  Yes Herminio Commons, MD  Multiple Vitamins-Iron (MULTIVITAMINS WITH IRON) TABS Take 1 tablet by mouth daily.   Yes [provider]  Omega-3 Fatty Acids (FISH OIL) 500 MG CAPS Take 500 mg by mouth daily.    Yes [provider]  vitamin B-12 (CYANOCOBALAMIN) 500 MCG tablet Take 500 mcg by mouth daily.   Yes [provider]     Vital Signs: BP (!) 139/93   Pulse 63   Temp 97.6 F (36.4 C) (Oral)   Resp 18   Ht 5\' 7"   (1.702 m)   Wt 175 lb (79.4 kg)   SpO2 100%   BMI 27.41 kg/m   Physical Exam awake, alert.  Chest clear to auscultation bilaterally.  Heart with regular rate and rhythm.  Abdomen soft, positive bowel sounds, nontender.  No significant lower extremity edema.  Imaging: No results found.  Labs:  CBC: Recent Labs    08/19/17 1212 02/17/18 0947 03/12/18 0859 03/19/18 0701  WBC 7.3 6.6 6.3 7.0  HGB 12.8 11.9 12.7 11.7*  HCT 38.8 36.3 38.0 34.6*  PLT 182 169 205 172    COAGS: Recent Labs    07/25/17 0856  INR 1.04  APTT 34    BMP: Recent Labs    08/19/17 1212 10/10/17 1214 02/17/18 0947 03/12/18 0859  NA 141  --  142 143  K 3.9  --  3.6 3.9  CL 103  --  104 104  CO2 29  --  28 31  GLUCOSE 91  --  96 96  BUN 12  --  11 15  CALCIUM 9.2  --  9.8 10.1  CREATININE 0.99 1.10* 0.98 0.98  GFRNONAA 54*  --  54* 54*  GFRAA >60  --  >60 >60    LIVER FUNCTION TESTS: Recent Labs    08/19/17 1212 02/17/18 0947  BILITOT 1.0 1.0  AST 20 18  ALT 9 11  ALKPHOS 70 55  PROT 7.3 7.1  ALBUMIN 3.9 3.9    Assessment and Plan: 77 y.o. female former smoker with a history of recurrent stage Ia left upper lobe non-small cell bronchogenic carcinoma.  She was initially diagnosed with right-sided lung cancer in March 2013 and underwent definitive right upper lobectomy with lymph node dissection.  Unfortunately, she was diagnosed with a second primary lesion, this time in the left upper lobe in early 2019.  She underwent definitive stereotactic radiotherapy in February 2019.  Subsequent surveillance imaging has demonstrated slow growth of the nodule consistent with recurrent disease.  She was recently seen in consultation by Dr. Laurence Ferrari on 03/04/2018 and deemed an appropriate candidate for CT-guided thermal ablation of the left upper lobe lung cancer and presents today for the procedure.  Details/risks of procedure, including but not limited to, internal bleeding, infection, injury to  adjacent structures/pneumothorax requiring chest tube, anesthesia related complications discussed with patient and family with their understanding and consent.  Post procedure she will be admitted to the hospital for overnight observation.  LABS PENDING   Electronically Signed: D. Rowe Robert, PA-C 03/19/2018, 7:43 AM   I spent a total of 30 minutes at the the patient's bedside AND on the patient's hospital floor or unit, greater than 50% of which was counseling/coordinating care for CT-guided thermal ablation of left lung lesion/cancer

## 2018-03-31 ENCOUNTER — Encounter (HOSPITAL_COMMUNITY): Payer: Self-pay | Admitting: Interventional Radiology

## 2018-04-03 DIAGNOSIS — Z1231 Encounter for screening mammogram for malignant neoplasm of breast: Secondary | ICD-10-CM | POA: Diagnosis not present

## 2018-04-24 DIAGNOSIS — R42 Dizziness and giddiness: Secondary | ICD-10-CM | POA: Diagnosis not present

## 2018-04-24 DIAGNOSIS — J449 Chronic obstructive pulmonary disease, unspecified: Secondary | ICD-10-CM | POA: Diagnosis not present

## 2018-04-24 DIAGNOSIS — R739 Hyperglycemia, unspecified: Secondary | ICD-10-CM | POA: Diagnosis not present

## 2018-04-24 DIAGNOSIS — C3490 Malignant neoplasm of unspecified part of unspecified bronchus or lung: Secondary | ICD-10-CM | POA: Diagnosis not present

## 2018-04-24 DIAGNOSIS — Z23 Encounter for immunization: Secondary | ICD-10-CM | POA: Diagnosis not present

## 2018-04-24 DIAGNOSIS — J189 Pneumonia, unspecified organism: Secondary | ICD-10-CM | POA: Diagnosis not present

## 2018-04-24 DIAGNOSIS — I10 Essential (primary) hypertension: Secondary | ICD-10-CM | POA: Diagnosis not present

## 2018-04-24 DIAGNOSIS — R7989 Other specified abnormal findings of blood chemistry: Secondary | ICD-10-CM | POA: Diagnosis not present

## 2018-04-24 DIAGNOSIS — M810 Age-related osteoporosis without current pathological fracture: Secondary | ICD-10-CM | POA: Diagnosis not present

## 2018-04-25 ENCOUNTER — Telehealth: Payer: Self-pay | Admitting: Internal Medicine

## 2018-04-25 DIAGNOSIS — M810 Age-related osteoporosis without current pathological fracture: Secondary | ICD-10-CM | POA: Diagnosis not present

## 2018-04-25 DIAGNOSIS — Z23 Encounter for immunization: Secondary | ICD-10-CM | POA: Diagnosis not present

## 2018-04-25 DIAGNOSIS — R42 Dizziness and giddiness: Secondary | ICD-10-CM | POA: Diagnosis not present

## 2018-04-25 DIAGNOSIS — J449 Chronic obstructive pulmonary disease, unspecified: Secondary | ICD-10-CM | POA: Diagnosis not present

## 2018-04-25 NOTE — Telephone Encounter (Signed)
MM PAL 11/14 - moved f/u to 11/18. Other appointments remain the same. Not able to reach patient at home number or leave message. Left message on cell. Schedule mailed.

## 2018-05-19 ENCOUNTER — Ambulatory Visit (HOSPITAL_COMMUNITY): Payer: Medicare Other

## 2018-05-20 ENCOUNTER — Encounter (HOSPITAL_COMMUNITY): Payer: Self-pay

## 2018-05-20 ENCOUNTER — Ambulatory Visit (HOSPITAL_COMMUNITY)
Admission: RE | Admit: 2018-05-20 | Discharge: 2018-05-20 | Disposition: A | Discharge status: Home / Self Care | Payer: Medicare Other | Source: Ambulatory Visit | Attending: Internal Medicine | Admitting: Internal Medicine

## 2018-05-20 ENCOUNTER — Inpatient Hospital Stay: Payer: Medicare Other | Attending: Internal Medicine

## 2018-05-20 DIAGNOSIS — Z79899 Other long term (current) drug therapy: Secondary | ICD-10-CM | POA: Insufficient documentation

## 2018-05-20 DIAGNOSIS — Z923 Personal history of irradiation: Secondary | ICD-10-CM | POA: Diagnosis not present

## 2018-05-20 DIAGNOSIS — C3412 Malignant neoplasm of upper lobe, left bronchus or lung: Secondary | ICD-10-CM | POA: Insufficient documentation

## 2018-05-20 DIAGNOSIS — R918 Other nonspecific abnormal finding of lung field: Secondary | ICD-10-CM | POA: Diagnosis not present

## 2018-05-20 DIAGNOSIS — C3490 Malignant neoplasm of unspecified part of unspecified bronchus or lung: Secondary | ICD-10-CM | POA: Diagnosis not present

## 2018-05-20 DIAGNOSIS — I700 Atherosclerosis of aorta: Secondary | ICD-10-CM | POA: Insufficient documentation

## 2018-05-20 DIAGNOSIS — I10 Essential (primary) hypertension: Secondary | ICD-10-CM | POA: Diagnosis not present

## 2018-05-20 DIAGNOSIS — J439 Emphysema, unspecified: Secondary | ICD-10-CM | POA: Diagnosis not present

## 2018-05-20 LAB — CBC WITH DIFFERENTIAL (CANCER CENTER ONLY)
ABS IMMATURE GRANULOCYTES: 0.02 10*3/uL (ref 0.00–0.07)
Basophils Absolute: 0 10*3/uL (ref 0.0–0.1)
Basophils Relative: 0 %
EOS PCT: 1 %
Eosinophils Absolute: 0.1 10*3/uL (ref 0.0–0.5)
HEMATOCRIT: 36.2 % (ref 36.0–46.0)
HEMOGLOBIN: 12.1 g/dL (ref 12.0–15.0)
Immature Granulocytes: 0 %
LYMPHS ABS: 3.1 10*3/uL (ref 0.7–4.0)
LYMPHS PCT: 41 %
MCH: 30.5 pg (ref 26.0–34.0)
MCHC: 33.4 g/dL (ref 30.0–36.0)
MCV: 91.2 fL (ref 80.0–100.0)
MONO ABS: 0.5 10*3/uL (ref 0.1–1.0)
Monocytes Relative: 7 %
NEUTROS ABS: 3.9 10*3/uL (ref 1.7–7.7)
Neutrophils Relative %: 51 %
Platelet Count: 173 10*3/uL (ref 150–400)
RBC: 3.97 MIL/uL (ref 3.87–5.11)
RDW: 13.3 % (ref 11.5–15.5)
WBC: 7.7 10*3/uL (ref 4.0–10.5)
nRBC: 0 % (ref 0.0–0.2)

## 2018-05-20 LAB — CMP (CANCER CENTER ONLY)
ALBUMIN: 3.7 g/dL (ref 3.5–5.0)
ALT: 10 U/L (ref 0–44)
AST: 18 U/L (ref 15–41)
Alkaline Phosphatase: 69 U/L (ref 38–126)
Anion gap: 8 (ref 5–15)
BUN: 10 mg/dL (ref 8–23)
CO2: 29 mmol/L (ref 22–32)
CREATININE: 0.94 mg/dL (ref 0.44–1.00)
Calcium: 9.5 mg/dL (ref 8.9–10.3)
Chloride: 106 mmol/L (ref 98–111)
GFR, EST NON AFRICAN AMERICAN: 57 mL/min — AB (ref >60)
GFR, Est AFR Am: >60 mL/min (ref >60)
GLUCOSE: 96 mg/dL (ref 70–99)
POTASSIUM: 4.1 mmol/L (ref 3.5–5.1)
SODIUM: 143 mmol/L (ref 135–145)
Total Bilirubin: 1 mg/dL (ref 0.3–1.2)
Total Protein: 7.1 g/dL (ref 6.5–8.1)

## 2018-05-20 MED ORDER — IOHEXOL 300 MG/ML  SOLN
75.0000 mL | Freq: Once | INTRAMUSCULAR | Status: AC | PRN
  Administered 2018-05-20: 75 mL via INTRAVENOUS

## 2018-05-20 MED ORDER — SODIUM CHLORIDE (PF) 0.9 % IJ SOLN
INTRAMUSCULAR | Status: AC
  Filled 2018-05-20: qty 50

## 2018-05-22 ENCOUNTER — Ambulatory Visit: Payer: Medicare Other | Admitting: Internal Medicine

## 2018-05-26 ENCOUNTER — Telehealth: Payer: Self-pay | Admitting: Internal Medicine

## 2018-05-26 ENCOUNTER — Inpatient Hospital Stay (HOSPITAL_BASED_OUTPATIENT_CLINIC_OR_DEPARTMENT_OTHER): Payer: Medicare Other | Admitting: Internal Medicine

## 2018-05-26 ENCOUNTER — Encounter: Payer: Self-pay | Admitting: Internal Medicine

## 2018-05-26 VITALS — BP 143/83 | HR 64 | Temp 97.6°F | Resp 18 | Ht 67.0 in | Wt 171.6 lb

## 2018-05-26 DIAGNOSIS — R918 Other nonspecific abnormal finding of lung field: Secondary | ICD-10-CM

## 2018-05-26 DIAGNOSIS — I10 Essential (primary) hypertension: Secondary | ICD-10-CM

## 2018-05-26 DIAGNOSIS — Z923 Personal history of irradiation: Secondary | ICD-10-CM | POA: Diagnosis not present

## 2018-05-26 DIAGNOSIS — C3412 Malignant neoplasm of upper lobe, left bronchus or lung: Secondary | ICD-10-CM

## 2018-05-26 DIAGNOSIS — C3490 Malignant neoplasm of unspecified part of unspecified bronchus or lung: Secondary | ICD-10-CM

## 2018-05-26 DIAGNOSIS — Z79899 Other long term (current) drug therapy: Secondary | ICD-10-CM

## 2018-05-26 DIAGNOSIS — C3491 Malignant neoplasm of unspecified part of right bronchus or lung: Secondary | ICD-10-CM

## 2018-05-26 NOTE — Telephone Encounter (Signed)
Gave pt avs and calendar  °

## 2018-05-26 NOTE — Progress Notes (Signed)
[facility] [phone]   Fax:([phone]) [place], Heather Bonita, MD [address] [place] [address]  DIAGNOSIS:  1) Stage IA non-small cell lung cancer diagnosed in March 2017.  2) She now has recurrent disease in the left upper lobe suspicious for stage IA.  She also has some other small pulmonary nodules that could be low-grade adenocarcinoma.  PRIOR THERAPY:  1) status post right upper lobectomy with lymph node dissection on September 24, 2011 2) status post stereotactic radiotherapy to the left upper lobe lung nodule in February 2019.   CURRENT THERAPY: Observation.  INTERVAL HISTORY: Heather [name] 77 y.o. female returns to the clinic today for regular follow-up visit.  The patient is feeling fine today with no concerning complaints.  She denied having any chest pain, shortness of breath, cough or hemoptysis.  She denied having any fever or chills.  She has no recent weight loss or night sweats.  She was referred to interventional radiology for consideration of thermal ablation of the left upper lobe lung nodule but repeat CT scan of the chest showed interval resolution of the nodular opacity in the region of the previously diagnosed stage Ia non-small cell lung cancer in the medial lingula of the left upper lobe and this procedure was aborted.  The patient is here today for evaluation with repeat CT scan of the chest.  MEDICAL HISTORY: Past Medical History:  Diagnosis Date  . Arthritis    reports her joints are fine and she has no pain   . Ascending aortic aneurysm ([place] [facility]) 07/20/2014   4.1 cm by CT  . Atrial fib/flutter, transient    post-op  . CHF (congestive heart failure) ([facility])    a. EF 30-35% by echo in 2013 --> similar results by repeat imaging in 2016 and 11/2017.  Marland Kitchen Constipation    corrects with diet  . Floater, vitreous    Right  . Hypertension   . Nonsquamous nonsmall cell neoplasm of lung ([facility]) 09/2011   Stage IB- s/p right upper lobectomy  . Pneumonia   . Psoriasis    Ankles  . Tobacco abuse     ALLERGIES:  is allergic to ibuprofen and powder.  MEDICATIONS:  Current Outpatient Medications  Medication Sig Dispense Refill  . alendronate (FOSAMAX) 70 MG tablet Take 70 mg by mouth every Monday.   3  . calcium-vitamin D (OSCAL WITH D) 500-200 MG-UNIT tablet Take 1 tablet by mouth 3 (three) times daily.    Marland Kitchen glucosamine-chondroitin 500-400 MG tablet Take 1 tablet by mouth daily.    . hydrochlorothiazide (MICROZIDE) 12.5 MG capsule Take 12.5 mg by mouth daily at 2 PM.   3  . meclizine (ANTIVERT) 12.5 MG tablet Take 12.5 mg by mouth daily at 12 noon.     . metoprolol succinate (TOPROL XL) 25 MG 24 hr tablet Take 1 tablet (25 mg total) by mouth 2 (two) times daily. 60 tablet 6  . Multiple Vitamins-Iron (MULTIVITAMINS WITH IRON) TABS Take 1 tablet by mouth daily.    . Omega-3 Fatty Acids (FISH OIL) 500 MG CAPS Take 500 mg by mouth daily.     . vitamin B-12 (CYANOCOBALAMIN) 500 MCG tablet Take 500 mcg by mouth daily.     No current facility-administered medications for this visit.     SURGICAL HISTORY:  Past Surgical History:  Procedure Laterality Date  . CATARACT EXTRACTION Bilateral 2016  . COLONOSCOPY  01/22/2012  Procedure: COLONOSCOPY;  Surgeon: Danie Binder, MD;  [facility];  Service: Endoscopy;  Laterality: N/A;  10:45  . COLONOSCOPY N/A 04/15/2017   Procedure: COLONOSCOPY;  Surgeon: Danie Binder, MD;  [facility];  Service: Endoscopy;  Laterality: N/A;  10:30am  . IR RADIOLOGIST EVAL & MGMT  03/04/2018  . POLYPECTOMY  04/15/2017   Procedure: POLYPECTOMY;  Surgeon: Danie Binder, MD;  [facility];  Service: Endoscopy;;  ascending colon  . RADIOLOGY WITH ANESTHESIA N/A 03/19/2018   Procedure: CT MICROWAVE THERMAL ABLATION WITH ANESTHESIA;  Surgeon: Jacqulynn Cadet, MD;  [facility];  Service: Anesthesiology;  Laterality: N/A;  . right  VATS (upper lobectomy)  08/29/11   Dr Roxan Hockey  . VIDEO BRONCHOSCOPY WITH ENDOBRONCHIAL NAVIGATION N/A 03/18/2015   Procedure: VIDEO BRONCHOSCOPY WITH ENDOBRONCHIAL NAVIGATION;  Surgeon: [name], MD;  Location: [facility];  Service: Thoracic;  Laterality: N/A;    REVIEW OF SYSTEMS:  A comprehensive review of systems was negative.   PHYSICAL EXAMINATION: General appearance: alert, cooperative and no distress Head: Normocephalic, without obvious abnormality, atraumatic Neck: no adenopathy, no JVD, supple, symmetrical, trachea midline and thyroid not enlarged, symmetric, no tenderness/mass/nodules Lymph nodes: Cervical, supraclavicular, and axillary nodes normal. Resp: clear to auscultation bilaterally Back: symmetric, no curvature. ROM normal. No CVA tenderness. Cardio: regular rate and rhythm, S1, S2 normal, no murmur, click, rub or gallop GI: soft, non-tender; bowel sounds normal; no masses,  no organomegaly Extremities: extremities normal, atraumatic, no cyanosis or edema  ECOG PERFORMANCE STATUS: 1 - Symptomatic but completely ambulatory  Blood pressure (!) 143/83, pulse 64, temperature 97.6 F (36.4 C), temperature source Oral, resp. rate 18, height 5\' 7"  (1.702 m), weight 171 lb 9.6 oz (77.8 kg), SpO2 100 %.  LABORATORY DATA: Lab Results  Component Value Date   WBC 7.7 05/20/2018   HGB 12.1 05/20/2018   HCT 36.2 05/20/2018   MCV 91.2 05/20/2018   PLT 173 05/20/2018      Chemistry      Component Value Date/Time   NA 143 05/20/2018 0934   K 4.1 05/20/2018 0934   CL 106 05/20/2018 0934   CO2 29 05/20/2018 0934   BUN 10 05/20/2018 0934   CREATININE 0.94 05/20/2018 0934      Component Value Date/Time   CALCIUM 9.5 05/20/2018 0934   ALKPHOS 69 05/20/2018 0934   AST 18 05/20/2018 0934   ALT 10 05/20/2018 0934   BILITOT 1.0 05/20/2018 0934       RADIOGRAPHIC STUDIES: Ct Chest W Contrast  Result Date: 05/20/2018 CLINICAL DATA:  History of bilateral lung  cancer. Left lung cancer in 2019 status post radiation therapy. Follow-up. EXAM: CT CHEST WITH CONTRAST TECHNIQUE: Multidetector CT imaging of the chest was performed during intravenous contrast administration. CONTRAST:  39mL OMNIPAQUE IOHEXOL 300 MG/ML  SOLN COMPARISON:  Multiple prior CT scans and PET-CT 07/16/2017 FINDINGS: Cardiovascular: The heart is normal in size. No pericardial effusion. Stable mild tortuosity, ectasia and calcification of the thoracic aorta. No dissection. The branch vessels are patent. Stable coronary artery calcifications. The right ventricle is slightly flattened by a pectus deformity. The pulmonary arteries appear normal. Mediastinum/Nodes: No mediastinal or hilar mass or lymphadenopathy. Lungs/Pleura: Stable surgical changes from a right upper lobe lobectomy. There are numerous bilateral ground-glass nodules. The left upper lobe lesion anteriorly at the level of the aortic arch measures 20 x 10 mm on image number 58. It previously measured 20 x 11 mm on  the chest CT of 02/17/2018. Stable surrounding radiation changes. Left upper lobe ground-glass opacity on image number 30 measures 23 x 14.5 mm and previously measured 23 x 13.5 mm. Ground-glass nodule in the right lower lobe on image number 47 measures 18 x 10 mm and is stable. Numerous other ground-glass nodules are stable. No new/progressive findings are identified. Upper Abdomen: No worrisome hepatic or adrenal gland lesions. Stable scarring changes involving the left kidney and stable renal cysts. Stable atherosclerotic calcifications involving the upper abdominal aorta. Musculoskeletal: No significant bony findings. IMPRESSION: 1. The left upper lobe pulmonary nodule is stable in size measuring a maximum of 20 x 10 mm. There are some surrounding radiation changes. 2. Numerous bilateral ground-glass nodules are stable. Recommend continued surveillance. 3. No enlarged mediastinal or hilar lymph nodes. 4. Stable surgical changes  from a right upper lobe lobectomy. 5. No findings for upper abdominal metastatic disease. Aortic Atherosclerosis (ICD10-I70.0) and Emphysema (ICD10-J43.9). Electronically Signed   By: Marijo Sanes M.D.   On: 05/20/2018 15:26    ASSESSMENT AND PLAN: This is a very pleasant 77 years old African-American female with recurrent non-small cell lung cancer presented with right upper lobe lung nodule that was previously treated with a stereotactic radiotherapy. The patient is current on observation and she is feeling fine. Repeat CT scan of the chest performed recently showed no concerning findings for disease progression. I discussed the scan results and showed the images to the patient today. I recommended for her to continue in observation with repeat CT scan of the chest in 3 months. The patient was advised to call immediately if she has any concerning symptoms in the interval. The patient voices understanding of current disease status and treatment options and is in agreement with the current care plan. All questions were answered. The patient knows to call the clinic with any problems, questions or concerns. We can certainly see the patient much sooner if necessary.  I spent 10 minutes counseling the patient face to face. The total time spent in the appointment was 15 minutes.  Disclaimer: This note was dictated with voice recognition software. Similar sounding words can inadvertently be transcribed and may not be corrected upon review.

## 2018-07-24 DIAGNOSIS — R42 Dizziness and giddiness: Secondary | ICD-10-CM | POA: Diagnosis not present

## 2018-07-24 DIAGNOSIS — M810 Age-related osteoporosis without current pathological fracture: Secondary | ICD-10-CM | POA: Diagnosis not present

## 2018-07-24 DIAGNOSIS — I10 Essential (primary) hypertension: Secondary | ICD-10-CM | POA: Diagnosis not present

## 2018-07-24 DIAGNOSIS — J449 Chronic obstructive pulmonary disease, unspecified: Secondary | ICD-10-CM | POA: Diagnosis not present

## 2018-08-22 ENCOUNTER — Inpatient Hospital Stay: Payer: Medicare Other | Attending: Internal Medicine

## 2018-08-22 ENCOUNTER — Ambulatory Visit (HOSPITAL_COMMUNITY)
Admission: RE | Admit: 2018-08-22 | Discharge: 2018-08-22 | Disposition: A | Discharge status: Home / Self Care | Payer: Medicare Other | Source: Ambulatory Visit | Attending: Internal Medicine | Admitting: Internal Medicine

## 2018-08-22 ENCOUNTER — Encounter (HOSPITAL_COMMUNITY): Payer: Self-pay

## 2018-08-22 DIAGNOSIS — C3412 Malignant neoplasm of upper lobe, left bronchus or lung: Secondary | ICD-10-CM | POA: Diagnosis not present

## 2018-08-22 DIAGNOSIS — C3490 Malignant neoplasm of unspecified part of unspecified bronchus or lung: Secondary | ICD-10-CM | POA: Diagnosis not present

## 2018-08-22 DIAGNOSIS — R918 Other nonspecific abnormal finding of lung field: Secondary | ICD-10-CM | POA: Diagnosis not present

## 2018-08-22 LAB — CBC WITH DIFFERENTIAL (CANCER CENTER ONLY)
Abs Immature Granulocytes: 0 10*3/uL (ref 0.00–0.07)
BASOS ABS: 0 10*3/uL (ref 0.0–0.1)
BASOS PCT: 0 %
EOS ABS: 0.1 10*3/uL (ref 0.0–0.5)
EOS PCT: 2 %
HCT: 35.5 % — ABNORMAL LOW (ref 36.0–46.0)
Hemoglobin: 11.5 g/dL — ABNORMAL LOW (ref 12.0–15.0)
Immature Granulocytes: 0 %
LYMPHS ABS: 3 10*3/uL (ref 0.7–4.0)
Lymphocytes Relative: 45 %
MCH: 30.4 pg (ref 26.0–34.0)
MCHC: 32.4 g/dL (ref 30.0–36.0)
MCV: 93.9 fL (ref 80.0–100.0)
Monocytes Absolute: 0.5 10*3/uL (ref 0.1–1.0)
Monocytes Relative: 8 %
NRBC: 0 % (ref 0.0–0.2)
Neutro Abs: 3 10*3/uL (ref 1.7–7.7)
Neutrophils Relative %: 45 %
PLATELETS: 169 10*3/uL (ref 150–400)
RBC: 3.78 MIL/uL — ABNORMAL LOW (ref 3.87–5.11)
RDW: 13.3 % (ref 11.5–15.5)
WBC: 6.7 10*3/uL (ref 4.0–10.5)

## 2018-08-22 LAB — CMP (CANCER CENTER ONLY)
ALBUMIN: 3.8 g/dL (ref 3.5–5.0)
ALT: 10 U/L (ref 0–44)
ANION GAP: 9 (ref 5–15)
AST: 19 U/L (ref 15–41)
Alkaline Phosphatase: 64 U/L (ref 38–126)
BUN: 12 mg/dL (ref 8–23)
CO2: 28 mmol/L (ref 22–32)
Calcium: 9.4 mg/dL (ref 8.9–10.3)
Chloride: 107 mmol/L (ref 98–111)
Creatinine: 1.11 mg/dL — ABNORMAL HIGH (ref 0.44–1.00)
GFR, EST NON AFRICAN AMERICAN: 48 mL/min — AB (ref >60)
GFR, Est AFR Am: 55 mL/min — ABNORMAL LOW (ref >60)
GLUCOSE: 83 mg/dL (ref 70–99)
POTASSIUM: 3.9 mmol/L (ref 3.5–5.1)
Sodium: 144 mmol/L (ref 135–145)
TOTAL PROTEIN: 6.9 g/dL (ref 6.5–8.1)
Total Bilirubin: 0.9 mg/dL (ref 0.3–1.2)

## 2018-08-22 MED ORDER — IOHEXOL 300 MG/ML  SOLN
75.0000 mL | Freq: Once | INTRAMUSCULAR | Status: AC | PRN
  Administered 2018-08-22: 75 mL via INTRAVENOUS

## 2018-08-22 MED ORDER — SODIUM CHLORIDE (PF) 0.9 % IJ SOLN
INTRAMUSCULAR | Status: AC
  Filled 2018-08-22: qty 50

## 2018-08-26 ENCOUNTER — Ambulatory Visit: Payer: Medicare Other | Admitting: Internal Medicine

## 2018-08-27 ENCOUNTER — Inpatient Hospital Stay: Payer: Medicare Other | Admitting: Physician Assistant

## 2018-08-28 ENCOUNTER — Telehealth: Payer: Self-pay | Admitting: *Deleted

## 2018-08-28 ENCOUNTER — Telehealth: Payer: Self-pay | Admitting: Internal Medicine

## 2018-08-28 NOTE — Telephone Encounter (Signed)
Scheduled appt per 2/20 sch message - pt is aware of appt date and time   

## 2018-08-28 NOTE — Telephone Encounter (Signed)
Received TC from patient requesting to reschedule her appt with Dr. Julien Nordmann. She was unable to come this week d/t transportation issues. Scheduling message sent. Contacted Ginette Otto, transportation coordinator regarding transportation.  Pt lives in [place] and it has been difficult arranging transportation from there

## 2018-09-08 ENCOUNTER — Encounter: Payer: Self-pay | Admitting: *Deleted

## 2018-09-08 ENCOUNTER — Telehealth: Payer: Self-pay | Admitting: Cardiovascular Disease

## 2018-09-08 ENCOUNTER — Encounter: Payer: Self-pay | Admitting: Cardiovascular Disease

## 2018-09-08 ENCOUNTER — Ambulatory Visit (INDEPENDENT_AMBULATORY_CARE_PROVIDER_SITE_OTHER): Payer: Medicare Other | Admitting: Cardiovascular Disease

## 2018-09-08 VITALS — BP 138/74 | HR 62 | Ht 66.7 in | Wt 179.0 lb

## 2018-09-08 DIAGNOSIS — I429 Cardiomyopathy, unspecified: Secondary | ICD-10-CM | POA: Diagnosis not present

## 2018-09-08 DIAGNOSIS — I480 Paroxysmal atrial fibrillation: Secondary | ICD-10-CM

## 2018-09-08 DIAGNOSIS — I10 Essential (primary) hypertension: Secondary | ICD-10-CM

## 2018-09-08 DIAGNOSIS — I5042 Chronic combined systolic (congestive) and diastolic (congestive) heart failure: Secondary | ICD-10-CM

## 2018-09-08 MED ORDER — SACUBITRIL-VALSARTAN 24-26 MG PO TABS: 1.0000 | ORAL_TABLET | Freq: Two times a day (BID) | ORAL | 6 refills | Status: DC

## 2018-09-08 MED ORDER — SACUBITRIL-VALSARTAN 24-26 MG PO TABS: 1.0000 | ORAL_TABLET | Freq: Two times a day (BID) | ORAL | 0 refills | Status: DC

## 2018-09-08 NOTE — Addendum Note (Signed)
Addended by: Laurine Blazer on: 09/08/2018 01:18 PM   Modules accepted: Orders

## 2018-09-08 NOTE — Progress Notes (Signed)
SUBJECTIVE: The patient presents for follow-up of chronic combined heart failure and paroxysmal atrial fibrillation.  Echocardiogram in May 2019 showed EF 30 to 35% with mild to moderate mitral vegetation and possible laminated thrombus in the left ventricular apex.  The patient denies any symptoms of chest pain, palpitations, shortness of breath, lightheadedness, dizziness, leg swelling, orthopnea, PND, and syncope.     Review of Systems: As per "subjective", otherwise negative.  Allergies  Allergen Reactions  . Ibuprofen Nausea And Vomiting  . Powder Rash    Gloves with powder    Current Outpatient Medications  Medication Sig Dispense Refill  . alendronate (FOSAMAX) 70 MG tablet Take 70 mg by mouth every Monday.   3  . calcium-vitamin D (OSCAL WITH D) 500-200 MG-UNIT tablet Take 1 tablet by mouth 3 (three) times daily.    Marland Kitchen glucosamine-chondroitin 500-400 MG tablet Take 1 tablet by mouth daily.    . hydrochlorothiazide (MICROZIDE) 12.5 MG capsule Take 12.5 mg by mouth daily at 2 PM.   3  . meclizine (ANTIVERT) 12.5 MG tablet Take 12.5 mg by mouth daily at 12 noon.     . metoprolol succinate (TOPROL XL) 25 MG 24 hr tablet Take 1 tablet (25 mg total) by mouth 2 (two) times daily. 60 tablet 6  . Multiple Vitamins-Iron (MULTIVITAMINS WITH IRON) TABS Take 1 tablet by mouth daily.    . Omega-3 Fatty Acids (FISH OIL) 500 MG CAPS Take 500 mg by mouth daily.     . vitamin B-12 (CYANOCOBALAMIN) 500 MCG tablet Take 500 mcg by mouth daily.     No current facility-administered medications for this visit.     Past Medical History:  Diagnosis Date  . Arthritis    reports her joints are fine and she has no pain   . Ascending aortic aneurysm ([facility]) 07/20/2014   4.1 cm by CT  . Atrial fib/flutter, transient    post-op  . CHF (congestive heart failure) ([facility])    a. EF 30-35% by echo in 2013 --> similar results by repeat imaging in 2016 and 11/2017.  Marland Kitchen Constipation    corrects with  diet  . Floater, vitreous    Right  . Hypertension   . Nonsquamous nonsmall cell neoplasm of lung ([facility]) 09/2011   Stage IB- s/p right upper lobectomy  . Pneumonia   . Psoriasis    Ankles  . Tobacco abuse     Past Surgical History:  Procedure Laterality Date  . CATARACT EXTRACTION Bilateral 2016  . COLONOSCOPY  01/22/2012   Procedure: COLONOSCOPY;  Surgeon: Danie Binder, MD;  [facility];  Service: Endoscopy;  Laterality: N/A;  10:45  . COLONOSCOPY N/A 04/15/2017   Procedure: COLONOSCOPY;  Surgeon: Danie Binder, MD;  [facility];  Service: Endoscopy;  Laterality: N/A;  10:30am  . IR RADIOLOGIST EVAL & MGMT  03/04/2018  . POLYPECTOMY  04/15/2017   Procedure: POLYPECTOMY;  Surgeon: Danie Binder, MD;  [facility];  Service: Endoscopy;;  ascending colon  . RADIOLOGY WITH ANESTHESIA N/A 03/19/2018   Procedure: CT MICROWAVE THERMAL ABLATION WITH ANESTHESIA;  Surgeon: Jacqulynn Cadet, MD;  [facility];  Service: Anesthesiology;  Laterality: N/A;  . right VATS (upper lobectomy)  08/29/11   Dr Roxan Hockey  . VIDEO BRONCHOSCOPY WITH ENDOBRONCHIAL NAVIGATION N/A 03/18/2015   Procedure: VIDEO BRONCHOSCOPY WITH ENDOBRONCHIAL NAVIGATION;  Surgeon: [name], MD;  Location: [facility];  Service: Thoracic;  Laterality: N/A;  Social History   Socioeconomic History  . Marital status: Widowed    Spouse name: Not on file  . Number of children: 3  . Years of education: Not on file  . Highest education level: Not on file  Occupational History  . Occupation: retired  Scientific laboratory technician  . Financial resource strain: Not on file  . Food insecurity:    Worry: Not on file    Inability: Not on file  . Transportation needs:    Medical: Not on file    Non-medical: Not on file  Tobacco Use  . Smoking status: Former Smoker    Packs/day: 0.30    Years: 20.00    Pack years: 6.00    Types: Cigarettes    Last attempt to quit: 01/12/2014    Years since  quitting: 4.6  . Smokeless tobacco: Never Used  Substance and Sexual Activity  . Alcohol use: No  . Drug use: No  . Sexual activity: Not on file  Lifestyle  . Physical activity:    Days per week: Not on file    Minutes per session: Not on file  . Stress: Not on file  Relationships  . Social connections:    Talks on phone: Not on file    Gets together: Not on file    Attends religious service: Not on file    Active member of club or organization: Not on file    Attends meetings of clubs or organizations: Not on file    Relationship status: Not on file  . Intimate partner violence:    Fear of current or ex partner: Not on file    Emotionally abused: Not on file    Physically abused: Not on file    Forced sexual activity: Not on file  Other Topics Concern  . Not on file  Social History Narrative   Lives w/ cousin     Vitals:   09/08/18 1258  BP: 138/74  Pulse: 62  SpO2: 95%  Weight: 179 lb (81.2 kg)  Height: 5' 6.7" (1.694 m)    Wt Readings from Last 3 Encounters:  09/08/18 179 lb (81.2 kg)  05/26/18 171 lb 9.6 oz (77.8 kg)  03/19/18 175 lb (79.4 kg)     PHYSICAL EXAM General: NAD HEENT: Normal. Neck: No JVD, no thyromegaly. Lungs: Clear to auscultation bilaterally with normal respiratory effort. CV: Regular rate and rhythm, normal S1/S2, no S3/S4, no murmur. No pretibial or periankle edema.  No carotid bruit.   Abdomen: Soft, nontender, no distention.  Neurologic: Alert and oriented.  Psych: Normal affect. Skin: Normal. Musculoskeletal: No gross deformities.    ECG: Reviewed above under Subjective   Labs: Lab Results  Component Value Date/Time   K 3.9 08/22/2018 09:02 AM   BUN 12 08/22/2018 09:02 AM   CREATININE 1.11 (H) 08/22/2018 09:02 AM   ALT 10 08/22/2018 09:02 AM   HGB 11.5 (L) 08/22/2018 09:02 AM     Lipids: No results found for: LDLCALC, LDLDIRECT, CHOL, TRIG, HDL     ASSESSMENT AND PLAN: 1.  Chronic combined heart failure: LVEF 30  to 35% in May 2019.  I will obtain a Lexiscan Myoview to assess for an ischemic etiology.  Continue Toprol-XL.  Previously unable to add ACE inhibitors or angiotensin receptor blockers due to soft blood pressures.  She takes hydrochlorothiazide as needed for edema.  Blood pressure is normal today.  I will start Entresto 24-26 mg twice daily.  If blood pressure becomes an issue, I  would reduce the dose of Toprol-XL.  2.  Paroxysmal atrial fibrillation: This is remote and history.  Not on anticoagulation.  3.  Hypertension: BP is normal today I will monitor as I am starting Entresto 24-26 mg twice daily.    Disposition: Follow up 6 months    Kate Sable, M.D., F.A.C.C.

## 2018-09-08 NOTE — Patient Instructions (Signed)
Medication Instructions:   Begin Entresto 24/26mg  twice a day.  Continue all other medications.     Testing/Procedures:  Your physician has requested that you have a lexiscan myoview. For further information please visit [email or web address]. Please follow instruction sheet, as given.  Office will contact with results via phone or letter.     Follow-Up: Your physician wants you to follow up in: 6 months.  You will receive a reminder letter in the mail one-two months in advance.  If you don't receive a letter, please call our office to schedule the follow up appointment    Any Other Special Instructions Will Be Listed Below (If Applicable).  If you need a refill on your cardiac medications before your next appointment, please call your pharmacy.

## 2018-09-08 NOTE — Telephone Encounter (Signed)
Pre-cert Verification for the following procedure    Lexiscan scheduled for 09-22-2018 at [facility]

## 2018-09-17 ENCOUNTER — Inpatient Hospital Stay: Payer: Medicare Other | Attending: Internal Medicine | Admitting: Physician Assistant

## 2018-09-17 ENCOUNTER — Other Ambulatory Visit: Payer: Self-pay

## 2018-09-17 ENCOUNTER — Encounter: Payer: Self-pay | Admitting: Physician Assistant

## 2018-09-17 VITALS — BP 130/88 | HR 63 | Temp 98.7°F | Resp 16

## 2018-09-17 DIAGNOSIS — Z79899 Other long term (current) drug therapy: Secondary | ICD-10-CM | POA: Diagnosis not present

## 2018-09-17 DIAGNOSIS — I712 Thoracic aortic aneurysm, without rupture: Secondary | ICD-10-CM | POA: Insufficient documentation

## 2018-09-17 DIAGNOSIS — I110 Hypertensive heart disease with heart failure: Secondary | ICD-10-CM | POA: Insufficient documentation

## 2018-09-17 DIAGNOSIS — Z923 Personal history of irradiation: Secondary | ICD-10-CM | POA: Diagnosis not present

## 2018-09-17 DIAGNOSIS — Z902 Acquired absence of lung [part of]: Secondary | ICD-10-CM

## 2018-09-17 DIAGNOSIS — I480 Paroxysmal atrial fibrillation: Secondary | ICD-10-CM | POA: Insufficient documentation

## 2018-09-17 DIAGNOSIS — I509 Heart failure, unspecified: Secondary | ICD-10-CM | POA: Insufficient documentation

## 2018-09-17 DIAGNOSIS — C3412 Malignant neoplasm of upper lobe, left bronchus or lung: Secondary | ICD-10-CM | POA: Diagnosis not present

## 2018-09-17 NOTE — Progress Notes (Signed)
[facility] OFFICE PROGRESS NOTE  Heather Fire, MD [address] [place] [address]  DIAGNOSIS:  1) Stage IAnon-small cell lung cancer diagnosed in March 2017. 2) She now has recurrent disease in the left upper lobe suspicious for stage IA.She also has some other small pulmonary nodules that could be low-grade adenocarcinoma.  PRIOR THERAPY: 1) status post right upper lobectomy with lymph node dissection on September 24, 2011 2) status post stereotactic radiotherapy to the left upper lobe lung nodule in February 2019.  CURRENT THERAPY: Observation  INTERVAL HISTORY: Heather [name] 78 y.o. female returns to the clinic for a follow-up visit.  The patient is recovering from a cold that she had about 2 weeks ago.  She states that her symptoms have greatly improved from prior but she still has residual mild fatigue and an occasional cough.  She denies any associated fevers, chills, night sweats, or weight loss.  She denies any chest pain, hemoptysis, or shortness of breath.  She denies any nausea, vomiting, chills, constipation, or diarrhea.  She denies any headaches or visual changes.  She denies any myalgias or arthralgias.  The patient is currently on observation.  She had a repeat CT scan and is here today for evaluation and to discuss her scan results.  MEDICAL HISTORY: Past Medical History:  Diagnosis Date  . Arthritis    reports her joints are fine and she has no pain   . Ascending aortic aneurysm ([place]) 07/20/2014   4.1 cm by CT  . Atrial fib/flutter, transient    post-op  . CHF (congestive heart failure) ([place])    a. EF 30-35% by echo in 2013 --> similar results by repeat imaging in 2016 and 11/2017.  Marland Kitchen Constipation    corrects with diet  . Floater, vitreous    Right  . Hypertension   . Nonsquamous nonsmall cell neoplasm of lung ([place]) 09/2011   Stage IB- s/p right upper lobectomy  . Pneumonia   . Psoriasis    Ankles  . Tobacco abuse      ALLERGIES:  is allergic to ibuprofen and powder.  MEDICATIONS:  Current Outpatient Medications  Medication Sig Dispense Refill  . alendronate (FOSAMAX) 70 MG tablet Take 70 mg by mouth every Monday.   3  . calcium-vitamin D (OSCAL WITH D) 500-200 MG-UNIT tablet Take 1 tablet by mouth 3 (three) times daily.    Marland Kitchen glucosamine-chondroitin 500-400 MG tablet Take 1 tablet by mouth daily.    . hydrochlorothiazide (MICROZIDE) 12.5 MG capsule Take 12.5 mg by mouth daily at 2 PM.   3  . meclizine (ANTIVERT) 12.5 MG tablet Take 12.5 mg by mouth daily at 12 noon.     . metoprolol succinate (TOPROL XL) 25 MG 24 hr tablet Take 1 tablet (25 mg total) by mouth 2 (two) times daily. 60 tablet 6  . Multiple Vitamins-Iron (MULTIVITAMINS WITH IRON) TABS Take 1 tablet by mouth daily.    . Omega-3 Fatty Acids (FISH OIL) 500 MG CAPS Take 500 mg by mouth daily.     . sacubitril-valsartan (ENTRESTO) 24-26 MG Take 1 tablet by mouth 2 (two) times daily. 60 tablet 6  . vitamin B-12 (CYANOCOBALAMIN) 500 MCG tablet Take 500 mcg by mouth daily.     No current facility-administered medications for this visit.     SURGICAL HISTORY:  Past Surgical History:  Procedure Laterality Date  . CATARACT EXTRACTION Bilateral 2016  . COLONOSCOPY  01/22/2012   Procedure: COLONOSCOPY;  Surgeon:  Danie Binder, MD;  [facility];  Service: Endoscopy;  Laterality: N/A;  10:45  . COLONOSCOPY N/A 04/15/2017   Procedure: COLONOSCOPY;  Surgeon: Danie Binder, MD;  [facility];  Service: Endoscopy;  Laterality: N/A;  10:30am  . IR RADIOLOGIST EVAL & MGMT  03/04/2018  . POLYPECTOMY  04/15/2017   Procedure: POLYPECTOMY;  Surgeon: Danie Binder, MD;  [facility];  Service: Endoscopy;;  ascending colon  . RADIOLOGY WITH ANESTHESIA N/A 03/19/2018   Procedure: CT MICROWAVE THERMAL ABLATION WITH ANESTHESIA;  Surgeon: Jacqulynn Cadet, MD;  [facility];  Service: Anesthesiology;  Laterality: N/A;  .  right VATS (upper lobectomy)  08/29/11   Dr Roxan Hockey  . VIDEO BRONCHOSCOPY WITH ENDOBRONCHIAL NAVIGATION N/A 03/18/2015   Procedure: VIDEO BRONCHOSCOPY WITH ENDOBRONCHIAL NAVIGATION;  Surgeon: [name], MD;  Location: [facility];  Service: Thoracic;  Laterality: N/A;    REVIEW OF SYSTEMS:   Review of Systems  Constitutional: Positive for fatigue. Negative for appetite change, chills, fever and unexpected weight change.  HENT:  Positive for nasal congestion. Negative for mouth sores, nosebleeds, sore throat and trouble swallowing.   Eyes: Negative for eye problems and icterus.  Respiratory: Positive for cough (improved). Negative for hemoptysis, shortness of breath and wheezing.   Cardiovascular: Negative for chest pain and leg swelling.  Gastrointestinal: Negative for abdominal pain, constipation, diarrhea, nausea and vomiting.  Genitourinary: Negative for bladder incontinence, difficulty urinating, dysuria, frequency and hematuria.   Musculoskeletal: Negative for back pain, gait problem, neck pain and neck stiffness.  Skin: Negative for itching and rash.  Neurological: Negative for dizziness, extremity weakness, gait problem, headaches, light-headedness and seizures.  Hematological: Negative for adenopathy. Does not bruise/bleed easily.  Psychiatric/Behavioral: Negative for confusion, depression and sleep disturbance. The patient is not nervous/anxious.     PHYSICAL EXAMINATION:  Blood pressure 130/88, pulse 63, temperature 98.7 F (37.1 C), resp. rate 16, SpO2 100 %.  ECOG PERFORMANCE STATUS: 1 - Symptomatic but completely ambulatory  Physical Exam  Constitutional: Oriented to person, place, and time and well-developed, well-nourished, and in no distress. No distress.  HENT:  Head: Normocephalic and atraumatic.  Mouth/Throat: Oropharynx is clear and moist. No oropharyngeal exudate.  Eyes: Conjunctivae are normal. Right eye exhibits no discharge. Left eye exhibits no  discharge. No scleral icterus.  Neck: Normal range of motion. Neck supple.  Cardiovascular: Normal rate, regular rhythm, normal heart sounds and intact distal pulses.   Pulmonary/Chest: Effort normal and breath sounds normal. No respiratory distress. No wheezes. No rales.  Abdominal: Soft. Bowel sounds are normal. Exhibits no distension and no mass. There is no tenderness.  Musculoskeletal: Normal range of motion. Exhibits no edema.  Lymphadenopathy:    No cervical adenopathy.  Neurological: Alert and oriented to person, place, and time. Exhibits normal muscle tone. Gait normal. Coordination normal.  Skin: Skin is warm and dry. No rash noted. Not diaphoretic. No erythema. No pallor.  Psychiatric: Mood, memory and judgment normal.  Vitals reviewed.  LABORATORY DATA: Lab Results  Component Value Date   WBC 6.7 08/22/2018   HGB 11.5 (L) 08/22/2018   HCT 35.5 (L) 08/22/2018   MCV 93.9 08/22/2018   PLT 169 08/22/2018      Chemistry      Component Value Date/Time   NA 144 08/22/2018 0902   K 3.9 08/22/2018 0902   CL 107 08/22/2018 0902   CO2 28 08/22/2018 0902   BUN 12 08/22/2018 0902   CREATININE  1.11 (H) 08/22/2018 0902      Component Value Date/Time   CALCIUM 9.4 08/22/2018 0902   ALKPHOS 64 08/22/2018 0902   AST 19 08/22/2018 0902   ALT 10 08/22/2018 0902   BILITOT 0.9 08/22/2018 0902       RADIOGRAPHIC STUDIES:  Ct Chest W Contrast  Result Date: 08/22/2018 CLINICAL DATA:  History of right upper lobectomy for stage I non-small cell lung cancer 2013. left upper lobe lung adenocarcinoma diagnosed January 2019 status post radiation therapy in February 2018. Patient presents for restaging on interval observation. EXAM: CT CHEST WITH CONTRAST TECHNIQUE: Multidetector CT imaging of the chest was performed during intravenous contrast administration. CONTRAST:  66mL OMNIPAQUE IOHEXOL 300 MG/ML  SOLN COMPARISON:  05/20/2018 chest CT. FINDINGS: Cardiovascular: Stable borderline  mild cardiomegaly. No significant pericardial effusion/thickening. Left anterior descending coronary atherosclerosis. Atherosclerotic nonaneurysmal thoracic aorta. Normal caliber pulmonary arteries. No central pulmonary emboli. Mediastinum/Nodes: No discrete thyroid nodules. Unremarkable esophagus. No pathologically enlarged axillary, mediastinal or hilar lymph nodes. Lungs/Pleura: No pneumothorax. No pleural effusion. Status post right upper lobectomy. No acute consolidative airspace disease. Subpleural medial left upper lobe 1.8 x 0.9 cm solid irregular pulmonary nodule (series 7/image 50) with surrounding sharply marginated ground-glass attenuation, previously 2.0 x 1.0 cm, slightly decreased. Innumerable ground-glass and sub solid pulmonary nodules scattered throughout both lungs have not convincingly changed in the interval. Representative central right lower lobe sub solid 2.0 cm nodule (series 7/image 46), previously 1.9 cm using similar measurement technique. Representative apical left upper lobe 2.7 cm ground-glass nodule (series 7/image 32), previously 2.7 cm using similar measurement technique. No new significant pulmonary nodules. Mild centrilobular and paraseptal emphysema. Upper abdomen: Cholelithiasis. Small low-attenuation lesions in the upper kidneys bilaterally are stable. Musculoskeletal: No aggressive appearing focal osseous lesions. Moderate thoracic spondylosis. IMPRESSION: 1. Solid pulmonary nodule in the medial left upper lobe with surrounding sharply marginated ground-glass attenuation is stable to slightly decreased, compatible with nodular fibrosis and surrounding radiation change. No local tumor recurrence. 2. Innumerable ground-glass and subsolid pulmonary nodules scattered throughout both lungs, not appreciably changed in the interval. Continued chest CT surveillance is warranted. 3. No new or progressive metastatic disease. Aortic Atherosclerosis (ICD10-I70.0) and Emphysema  (ICD10-J43.9). Electronically Signed   By: Ilona Sorrel M.D.   On: 08/22/2018 14:44     ASSESSMENT/PLAN:  This is a very pleasant 78 year old African-American female with recurrent non-small cell lung cancer who presented with a right upper lobe lung nodule in 2013. She presented with recurrent disease in the left upper lobe in February 2019.  She is status post stereotactic radiotherapy.  The patient has been on observation since that time. Patient was seen with Dr. Julien Nordmann today.  The patient recently underwent a repeat CT scan.  Dr. Earlie Server personally and independently reviewed the scan results and discussed the results with the patient.  The scan did not show any evidence for disease progression  I recommend for her to continue on observation.  I will arrange for her to obtain a repeat CT scan in approximately 6 months.  I will see the patient back in 6 months to review the scan results and for evaluation.  The patient was advised to call immediately if she has any concerning symptoms in the interval. The patient voices understanding of current disease status and treatment options and is in agreement with the current care plan. All questions were answered. The patient knows to call the clinic with any problems, questions or concerns. We can certainly see the  patient much sooner if necessary   Orders Placed This Encounter  Procedures  . CT Chest W Contrast    Standing Status:   Future    Standing Expiration Date:   09/17/2019    Order Specific Question:   ** REASON FOR EXAM (FREE TEXT)    Answer:   Restaging lung cancer    Order Specific Question:   If indicated for the ordered procedure, I authorize the administration of contrast media per Radiology protocol    Answer:   Yes    Order Specific Question:   Preferred imaging location?    Answer:   [facility]    Order Specific Question:   Radiology Contrast Protocol - do NOT remove file path    Answer:    [email or web address]  . CMP ([facility] only)    Standing Status:   Future    Standing Expiration Date:   09/17/2019  . CBC with Differential (Cancer Center Only)    Standing Status:   Future    Standing Expiration Date:   09/17/2019     Tobe Sos Avrum Kimball, PA-C 09/17/18  ADDENDUM: Hematology/Oncology Attending: I had a face-to-face encounter with the patient today.  I recommended her care plan.  This is a very pleasant 78 years old African-American female with history of recurrent non-small cell lung cancer status post stereotactic body radiotherapy to right upper lobe as well as left upper lobe pulmonary nodules.  The patient is currently on observation and she is feeling fine.  She is recovering from flulike symptoms and she is feeling much better. She had repeat CT scan of the chest performed recently.  I personally and independently reviewed the scans and discussed the result with the patient today. Her scan showed no concerning findings for disease recurrence or progression. I recommended for the patient to continue on observation with repeat CT scan of the chest in 6 months. The patient was advised to call immediately if she has any concerning symptoms in the interval.  Disclaimer: This note was dictated with voice recognition software. Similar sounding words can inadvertently be transcribed and may be missed upon review. Eilleen Kempf, MD 09/17/18

## 2018-09-18 ENCOUNTER — Telehealth: Payer: Self-pay | Admitting: Internal Medicine

## 2018-09-18 NOTE — Telephone Encounter (Signed)
Added appointments for September 2020. Not able to reach patient, updated schedule will be mailed.

## 2018-09-22 ENCOUNTER — Encounter (HOSPITAL_COMMUNITY)
Admission: RE | Admit: 2018-09-22 | Discharge: 2018-09-22 | Disposition: A | Discharge status: Home / Self Care | Payer: Medicare Other | Source: Ambulatory Visit | Attending: Cardiovascular Disease | Admitting: Cardiovascular Disease

## 2018-09-22 ENCOUNTER — Ambulatory Visit (HOSPITAL_COMMUNITY)
Admission: RE | Admit: 2018-09-22 | Discharge: 2018-09-22 | Disposition: A | Discharge status: Home / Self Care | Payer: Medicare Other | Source: Ambulatory Visit | Attending: Cardiovascular Disease | Admitting: Cardiovascular Disease

## 2018-09-22 ENCOUNTER — Other Ambulatory Visit: Payer: Self-pay

## 2018-09-22 ENCOUNTER — Encounter (HOSPITAL_COMMUNITY): Payer: Self-pay

## 2018-09-22 DIAGNOSIS — I429 Cardiomyopathy, unspecified: Secondary | ICD-10-CM | POA: Diagnosis not present

## 2018-09-22 LAB — NM MYOCAR MULTI W/SPECT W/WALL MOTION / EF
CHL CUP NUCLEAR SDS: 2
CHL CUP NUCLEAR SRS: 2
CHL CUP NUCLEAR SSS: 4
CHL CUP RESTING HR STRESS: 57 {beats}/min
LV dias vol: 116 mL (ref 46–106)
LV sys vol: 46 mL
NUC STRESS TID: 0.81
Peak HR: 96 {beats}/min
RATE: 0.69

## 2018-09-22 MED ORDER — SODIUM CHLORIDE 0.9% FLUSH
INTRAVENOUS | Status: AC
  Administered 2018-09-22: 10 mL via INTRAVENOUS
  Filled 2018-09-22: qty 10

## 2018-09-22 MED ORDER — TECHNETIUM TC 99M TETROFOSMIN IV KIT
10.0000 | PACK | Freq: Once | INTRAVENOUS | Status: AC | PRN
  Administered 2018-09-22: 10.27 via INTRAVENOUS

## 2018-09-22 MED ORDER — TECHNETIUM TC 99M TETROFOSMIN IV KIT
30.0000 | PACK | Freq: Once | INTRAVENOUS | Status: AC | PRN
  Administered 2018-09-22: 29.6 via INTRAVENOUS

## 2018-09-22 MED ORDER — REGADENOSON 0.4 MG/5ML IV SOLN
INTRAVENOUS | Status: AC
  Administered 2018-09-22: 0.4 mg via INTRAVENOUS
  Filled 2018-09-22: qty 5

## 2019-01-20 DIAGNOSIS — I5022 Chronic systolic (congestive) heart failure: Secondary | ICD-10-CM | POA: Diagnosis not present

## 2019-01-20 DIAGNOSIS — Z0001 Encounter for general adult medical examination with abnormal findings: Secondary | ICD-10-CM | POA: Diagnosis not present

## 2019-01-20 DIAGNOSIS — Z1389 Encounter for screening for other disorder: Secondary | ICD-10-CM | POA: Diagnosis not present

## 2019-01-20 DIAGNOSIS — M810 Age-related osteoporosis without current pathological fracture: Secondary | ICD-10-CM | POA: Diagnosis not present

## 2019-01-20 DIAGNOSIS — I10 Essential (primary) hypertension: Secondary | ICD-10-CM | POA: Diagnosis not present

## 2019-01-21 DIAGNOSIS — Z0001 Encounter for general adult medical examination with abnormal findings: Secondary | ICD-10-CM | POA: Diagnosis not present

## 2019-01-21 DIAGNOSIS — I10 Essential (primary) hypertension: Secondary | ICD-10-CM | POA: Diagnosis not present

## 2019-02-20 DIAGNOSIS — I10 Essential (primary) hypertension: Secondary | ICD-10-CM | POA: Diagnosis not present

## 2019-02-20 DIAGNOSIS — I5022 Chronic systolic (congestive) heart failure: Secondary | ICD-10-CM | POA: Diagnosis not present

## 2019-03-11 ENCOUNTER — Telehealth: Payer: Self-pay | Admitting: Internal Medicine

## 2019-03-11 NOTE — Telephone Encounter (Signed)
Called pt per 9/2 sch message - no answer - left message for pt to call back to reschedule appt.

## 2019-03-13 ENCOUNTER — Telehealth: Payer: Self-pay | Admitting: Internal Medicine

## 2019-03-13 NOTE — Telephone Encounter (Signed)
Returned patient's phone call regarding rescheduling 09/08 appointment, left a voicemail.

## 2019-03-17 ENCOUNTER — Inpatient Hospital Stay: Payer: Medicare Other

## 2019-03-17 ENCOUNTER — Ambulatory Visit (HOSPITAL_COMMUNITY): Admission: RE | Admit: 2019-03-17 | Payer: Medicare Other | Source: Ambulatory Visit

## 2019-03-17 ENCOUNTER — Telehealth: Payer: Self-pay | Admitting: Internal Medicine

## 2019-03-17 NOTE — Telephone Encounter (Signed)
Returned patient's phone call regarding rescheduling 09/10 appointments, left a voicemail.

## 2019-03-19 ENCOUNTER — Inpatient Hospital Stay: Payer: Medicare Other

## 2019-03-19 ENCOUNTER — Other Ambulatory Visit: Payer: Self-pay

## 2019-03-19 ENCOUNTER — Telehealth: Payer: Self-pay | Admitting: Internal Medicine

## 2019-03-19 ENCOUNTER — Encounter: Payer: Self-pay | Admitting: Internal Medicine

## 2019-03-19 ENCOUNTER — Inpatient Hospital Stay: Payer: Medicare Other | Attending: Internal Medicine | Admitting: Internal Medicine

## 2019-03-19 ENCOUNTER — Ambulatory Visit (HOSPITAL_COMMUNITY)
Admission: RE | Admit: 2019-03-19 | Discharge: 2019-03-19 | Disposition: A | Discharge status: Home / Self Care | Payer: Medicare Other | Source: Ambulatory Visit | Attending: Physician Assistant | Admitting: Physician Assistant

## 2019-03-19 VITALS — BP 139/92 | HR 68 | Temp 98.0°F | Resp 20 | Ht 66.7 in | Wt 166.4 lb

## 2019-03-19 DIAGNOSIS — M1990 Unspecified osteoarthritis, unspecified site: Secondary | ICD-10-CM | POA: Insufficient documentation

## 2019-03-19 DIAGNOSIS — C3490 Malignant neoplasm of unspecified part of unspecified bronchus or lung: Secondary | ICD-10-CM | POA: Diagnosis not present

## 2019-03-19 DIAGNOSIS — Z79899 Other long term (current) drug therapy: Secondary | ICD-10-CM | POA: Diagnosis not present

## 2019-03-19 DIAGNOSIS — Z886 Allergy status to analgesic agent status: Secondary | ICD-10-CM | POA: Diagnosis not present

## 2019-03-19 DIAGNOSIS — C3412 Malignant neoplasm of upper lobe, left bronchus or lung: Secondary | ICD-10-CM

## 2019-03-19 LAB — CBC WITH DIFFERENTIAL (CANCER CENTER ONLY)
Abs Immature Granulocytes: 0.01 10*3/uL (ref 0.00–0.07)
Basophils Absolute: 0 10*3/uL (ref 0.0–0.1)
Basophils Relative: 0 %
Eosinophils Absolute: 0.1 10*3/uL (ref 0.0–0.5)
Eosinophils Relative: 1 %
HCT: 34.1 % — ABNORMAL LOW (ref 36.0–46.0)
Hemoglobin: 11.2 g/dL — ABNORMAL LOW (ref 12.0–15.0)
Immature Granulocytes: 0 %
Lymphocytes Relative: 49 %
Lymphs Abs: 3.1 10*3/uL (ref 0.7–4.0)
MCH: 30.5 pg (ref 26.0–34.0)
MCHC: 32.8 g/dL (ref 30.0–36.0)
MCV: 92.9 fL (ref 80.0–100.0)
Monocytes Absolute: 0.4 10*3/uL (ref 0.1–1.0)
Monocytes Relative: 6 %
Neutro Abs: 2.8 10*3/uL (ref 1.7–7.7)
Neutrophils Relative %: 44 %
Platelet Count: 171 10*3/uL (ref 150–400)
RBC: 3.67 MIL/uL — ABNORMAL LOW (ref 3.87–5.11)
RDW: 13.3 % (ref 11.5–15.5)
WBC Count: 6.4 10*3/uL (ref 4.0–10.5)
nRBC: 0 % (ref 0.0–0.2)

## 2019-03-19 LAB — CMP (CANCER CENTER ONLY)
ALT: 8 U/L (ref 0–44)
AST: 17 U/L (ref 15–41)
Albumin: 4 g/dL (ref 3.5–5.0)
Alkaline Phosphatase: 51 U/L (ref 38–126)
Anion gap: 7 (ref 5–15)
BUN: 12 mg/dL (ref 8–23)
CO2: 28 mmol/L (ref 22–32)
Calcium: 9.1 mg/dL (ref 8.9–10.3)
Chloride: 106 mmol/L (ref 98–111)
Creatinine: 0.99 mg/dL (ref 0.44–1.00)
GFR, Est AFR Am: >60 mL/min (ref >60)
GFR, Estimated: 54 mL/min — ABNORMAL LOW (ref >60)
Glucose, Bld: 93 mg/dL (ref 70–99)
Potassium: 3.5 mmol/L (ref 3.5–5.1)
Sodium: 141 mmol/L (ref 135–145)
Total Bilirubin: 0.8 mg/dL (ref 0.3–1.2)
Total Protein: 6.7 g/dL (ref 6.5–8.1)

## 2019-03-19 MED ORDER — IOHEXOL 300 MG/ML  SOLN
75.0000 mL | Freq: Once | INTRAMUSCULAR | Status: AC | PRN
  Administered 2019-03-19: 75 mL via INTRAVENOUS

## 2019-03-19 MED ORDER — SODIUM CHLORIDE (PF) 0.9 % IJ SOLN
INTRAMUSCULAR | Status: AC
  Filled 2019-03-19: qty 50

## 2019-03-19 NOTE — Progress Notes (Signed)
[facility] [phone]   Fax:([phone]) [place], [place], MD [address] [place] [address]  DIAGNOSIS:  1) Stage IA non-small cell lung cancer diagnosed in March 2017.  2) She now has recurrent disease in the left upper lobe suspicious for stage IA.  She also has some other small pulmonary nodules that could be low-grade adenocarcinoma.  PRIOR THERAPY:  1) status post right upper lobectomy with lymph node dissection on September 24, 2011 2) status post stereotactic radiotherapy to the left upper lobe lung nodule in February 2019.   CURRENT THERAPY: Observation.  INTERVAL HISTORY: Heather [name] 78 y.o. female returns back to the clinic today for 6 months follow-up visit.  The patient is feeling fine today with no concerning complaints.  She denied having any chest pain, shortness of breath, cough or hemoptysis.  She denied having any fever or chills.  She has no nausea, vomiting, diarrhea or constipation.  She had CT scan of the chest performed earlier today and she is here for evaluation and discussion of her lab and scan results.  MEDICAL HISTORY: Past Medical History:  Diagnosis Date  . Arthritis    reports her joints are fine and she has no pain   . Ascending aortic aneurysm ([place]) 07/20/2014   4.1 cm by CT  . Atrial fib/flutter, transient    post-op  . CHF (congestive heart failure) ([facility])    a. EF 30-35% by echo in 2013 --> similar results by repeat imaging in 2016 and 11/2017.  Marland Kitchen Constipation    corrects with diet  . Floater, vitreous    Right  . Hypertension   . Nonsquamous nonsmall cell neoplasm of lung ([place]) 09/2011   Stage IB- s/p right upper lobectomy  . Pneumonia   . Psoriasis    Ankles  . Tobacco abuse     ALLERGIES:  is allergic to ibuprofen and powder.  MEDICATIONS:  Current Outpatient Medications  Medication Sig Dispense Refill  . alendronate (FOSAMAX) 70 MG tablet Take 70 mg by  mouth every Monday.   3  . calcium-vitamin D (OSCAL WITH D) 500-200 MG-UNIT tablet Take 1 tablet by mouth 3 (three) times daily.    Marland Kitchen glucosamine-chondroitin 500-400 MG tablet Take 1 tablet by mouth daily.    . hydrochlorothiazide (MICROZIDE) 12.5 MG capsule Take 12.5 mg by mouth daily at 2 PM.   3  . meclizine (ANTIVERT) 12.5 MG tablet Take 12.5 mg by mouth daily at 12 noon.     . metoprolol succinate (TOPROL XL) 25 MG 24 hr tablet Take 1 tablet (25 mg total) by mouth 2 (two) times daily. 60 tablet 6  . Multiple Vitamins-Iron (MULTIVITAMINS WITH IRON) TABS Take 1 tablet by mouth daily.    . Omega-3 Fatty Acids (FISH OIL) 500 MG CAPS Take 500 mg by mouth daily.     . sacubitril-valsartan (ENTRESTO) 24-26 MG Take 1 tablet by mouth 2 (two) times daily. 60 tablet 6  . vitamin B-12 (CYANOCOBALAMIN) 500 MCG tablet Take 500 mcg by mouth daily.     No current facility-administered medications for this visit.    Facility-Administered Medications Ordered in Other Visits  Medication Dose Route Frequency Provider Last Rate Last Dose  . sodium chloride (PF) 0.9 % injection             SURGICAL HISTORY:  Past Surgical History:  Procedure Laterality Date  . CATARACT EXTRACTION Bilateral 2016  .  COLONOSCOPY  01/22/2012   Procedure: COLONOSCOPY;  Surgeon: Danie Binder, MD;  [facility];  Service: Endoscopy;  Laterality: N/A;  10:45  . COLONOSCOPY N/A 04/15/2017   Procedure: COLONOSCOPY;  Surgeon: Danie Binder, MD;  [facility];  Service: Endoscopy;  Laterality: N/A;  10:30am  . IR RADIOLOGIST EVAL & MGMT  03/04/2018  . POLYPECTOMY  04/15/2017   Procedure: POLYPECTOMY;  Surgeon: Danie Binder, MD;  [facility];  Service: Endoscopy;;  ascending colon  . RADIOLOGY WITH ANESTHESIA N/A 03/19/2018   Procedure: CT MICROWAVE THERMAL ABLATION WITH ANESTHESIA;  Surgeon: Jacqulynn Cadet, MD;  [facility];  Service: Anesthesiology;  Laterality: N/A;  . right VATS  (upper lobectomy)  08/29/11   Dr Roxan Hockey  . VIDEO BRONCHOSCOPY WITH ENDOBRONCHIAL NAVIGATION N/A 03/18/2015   Procedure: VIDEO BRONCHOSCOPY WITH ENDOBRONCHIAL NAVIGATION;  Surgeon: [name], MD;  Location: [facility];  Service: Thoracic;  Laterality: N/A;    REVIEW OF SYSTEMS:  A comprehensive review of systems was negative.   PHYSICAL EXAMINATION: General appearance: alert, cooperative and no distress Head: Normocephalic, without obvious abnormality, atraumatic Neck: no adenopathy, no JVD, supple, symmetrical, trachea midline and thyroid not enlarged, symmetric, no tenderness/mass/nodules Lymph nodes: Cervical, supraclavicular, and axillary nodes normal. Resp: clear to auscultation bilaterally Back: symmetric, no curvature. ROM normal. No CVA tenderness. Cardio: regular rate and rhythm, S1, S2 normal, no murmur, click, rub or gallop GI: soft, non-tender; bowel sounds normal; no masses,  no organomegaly Extremities: extremities normal, atraumatic, no cyanosis or edema  ECOG PERFORMANCE STATUS: 1 - Symptomatic but completely ambulatory  Blood pressure (!) 139/92, pulse 68, temperature 98 F (36.7 C), temperature source Oral, resp. rate 20, height 5' 6.7" (1.694 m), weight 166 lb 6.4 oz (75.5 kg), SpO2 100 %.  LABORATORY DATA: Lab Results  Component Value Date   WBC 6.4 03/19/2019   HGB 11.2 (L) 03/19/2019   HCT 34.1 (L) 03/19/2019   MCV 92.9 03/19/2019   PLT 171 03/19/2019      Chemistry      Component Value Date/Time   NA 141 03/19/2019 0841   K 3.5 03/19/2019 0841   CL 106 03/19/2019 0841   CO2 28 03/19/2019 0841   BUN 12 03/19/2019 0841   CREATININE 0.99 03/19/2019 0841      Component Value Date/Time   CALCIUM 9.1 03/19/2019 0841   ALKPHOS 51 03/19/2019 0841   AST 17 03/19/2019 0841   ALT 8 03/19/2019 0841   BILITOT 0.8 03/19/2019 0841       RADIOGRAPHIC STUDIES: No results found.  ASSESSMENT AND PLAN: This is a very pleasant 78 years old  African-American female with recurrent non-small cell lung cancer presented with right upper lobe lung nodule that was previously treated with a stereotactic radiotherapy. The patient is doing fine today with no concerning complaints. She had repeat CT scan of the chest performed earlier today.  I personally and independently reviewed the scan images and I do not see any concerning findings for disease progression but the final report is still pending. I recommended for the patient to come back for follow-up visit in 6 months with repeat CT scan of the chest but we will call her sooner if the final report showed any concerning findings. She was advised to call immediately if she has any concerning symptoms in the interval. The patient voices understanding of current disease status and treatment options and is in agreement with the current care plan.  All questions were answered. The patient knows to call the clinic with any problems, questions or concerns. We can certainly see the patient much sooner if necessary.  I spent 10 minutes counseling the patient face to face. The total time spent in the appointment was 15 minutes.  Disclaimer: This note was dictated with voice recognition software. Similar sounding words can inadvertently be transcribed and may not be corrected upon review.

## 2019-03-19 NOTE — Telephone Encounter (Signed)
Scheduled appt per 9/10 los - mailed letter with appt date and time

## 2019-03-23 DIAGNOSIS — I5022 Chronic systolic (congestive) heart failure: Secondary | ICD-10-CM | POA: Diagnosis not present

## 2019-03-23 DIAGNOSIS — I10 Essential (primary) hypertension: Secondary | ICD-10-CM | POA: Diagnosis not present

## 2019-03-31 ENCOUNTER — Telehealth: Payer: Self-pay | Admitting: Cardiovascular Disease

## 2019-03-31 NOTE — Telephone Encounter (Signed)
Virtual Visit Pre-Appointment Phone Call  "(Name), I am calling you today to discuss your upcoming appointment. We are currently trying to limit exposure to the virus that causes COVID-19 by seeing patients at home rather than in the office."  1. "What is the BEST phone number to call the day of the visit?" - include this in appointment notes  2. Do you have or have access to (through a family member/friend) a smartphone with video capability that we can use for your visit?" a. If yes - list this number in appt notes as cell (if different from BEST phone #) and list the appointment type as a VIDEO visit in appointment notes b. If no - list the appointment type as a PHONE visit in appointment notes  3. Confirm consent - "In the setting of the current Covid19 crisis, you are scheduled for a (phone or video) visit with your provider on (date) at (time).  Just as we do with many in-office visits, in order for you to participate in this visit, we must obtain consent.  If you'd like, I can send this to your mychart (if signed up) or email for you to review.  Otherwise, I can obtain your verbal consent now.  All virtual visits are billed to your insurance company just like a normal visit would be.  By agreeing to a virtual visit, we'd like you to understand that the technology does not allow for your provider to perform an examination, and thus may limit your provider's ability to fully assess your condition. If your provider identifies any concerns that need to be evaluated in person, we will make arrangements to do so.  Finally, though the technology is pretty good, we cannot assure that it will always work on either your or our end, and in the setting of a video visit, we may have to convert it to a phone-only visit.  In either situation, we cannot ensure that we have a secure connection.  Are you willing to proceed?" STAFF: Did the patient verbally acknowledge consent to telehealth visit? Document  YES/NO here: YES  4. Advise patient to be prepared - "Two hours prior to your appointment, go ahead and check your blood pressure, pulse, oxygen saturation, and your weight (if you have the equipment to check those) and write them all down. When your visit starts, your provider will ask you for this information. If you have an Apple Watch or Kardia device, please plan to have heart rate information ready on the day of your appointment. Please have a pen and paper handy nearby the day of the visit as well."  5. Give patient instructions for MyChart download to smartphone OR Doximity/Doxy.me as below if video visit (depending on what platform provider is using)  6. Inform patient they will receive a phone call 15 minutes prior to their appointment time (may be from unknown caller ID) so they should be prepared to answer    TELEPHONE CALL NOTE  DRUSCILLA PETSCH has been deemed a candidate for a follow-up tele-health visit to limit community exposure during the Covid-19 pandemic. I spoke with the patient via phone to ensure availability of phone/video source, confirm preferred email & phone number, and discuss instructions and expectations.  I reminded Heather [name] to be prepared with any vital sign and/or heart rhythm information that could potentially be obtained via home monitoring, at the time of her visit. I reminded Heather [name] to expect a phone call prior to  her visit.  Weston Anna 03/31/2019 4:44 PM   INSTRUCTIONS FOR DOWNLOADING THE MYCHART APP TO SMARTPHONE  - The patient must first make sure to have activated MyChart and know their login information - If Apple, go to [employer] and type in MyChart in the search bar and download the app. If Android, ask patient to go to [employer] and type in [facility] in the search bar and download the app. The app is free but as with any other app downloads, their phone may require them to verify saved payment information or Apple/Android  password.  - The patient will need to then log into the app with their MyChart username and password, and select [facility] as their healthcare provider to link the account. When it is time for your visit, go to the MyChart app, find appointments, and click Begin Video Visit. Be sure to Select Allow for your device to access the Microphone and Camera for your visit. You will then be connected, and your provider will be with you shortly.  **If they have any issues connecting, or need assistance please contact MyChart service desk [phone])**  **If using a computer, in order to ensure the best quality for their visit they will need to use either of the following Internet Browsers: [employer], or Google Chrome**  IF USING DOXIMITY or DOXY.ME - The patient will receive a link just prior to their visit by text.     FULL LENGTH CONSENT FOR TELE-HEALTH VISIT   I hereby voluntarily request, consent and authorize [facility] and its employed or contracted physicians, physician assistants, nurse practitioners or other licensed health care professionals (the Practitioner), to provide me with telemedicine health care services (the Services") as deemed necessary by the treating Practitioner. I acknowledge and consent to receive the Services by the Practitioner via telemedicine. I understand that the telemedicine visit will involve communicating with the Practitioner through live audiovisual communication technology and the disclosure of certain medical information by electronic transmission. I acknowledge that I have been given the opportunity to request an in-person assessment or other available alternative prior to the telemedicine visit and am voluntarily participating in the telemedicine visit.  I understand that I have the right to withhold or withdraw my consent to the use of telemedicine in the course of my care at any time, without affecting my right to future care or treatment,  and that the Practitioner or I may terminate the telemedicine visit at any time. I understand that I have the right to inspect all information obtained and/or recorded in the course of the telemedicine visit and may receive copies of available information for a reasonable fee.  I understand that some of the potential risks of receiving the Services via telemedicine include:   Delay or interruption in medical evaluation due to technological equipment failure or disruption;  Information transmitted may not be sufficient (e.g. poor resolution of images) to allow for appropriate medical decision making by the Practitioner; and/or   In rare instances, security protocols could fail, causing a breach of personal health information.  Furthermore, I acknowledge that it is my responsibility to provide information about my medical history, conditions and care that is complete and accurate to the best of my ability. I acknowledge that Practitioner's advice, recommendations, and/or decision may be based on factors not within their control, such as incomplete or inaccurate data provided by me or distortions of diagnostic images or specimens that may result from electronic transmissions. I  understand that the practice of medicine is not an exact science and that Practitioner makes no warranties or guarantees regarding treatment outcomes. I acknowledge that I will receive a copy of this consent concurrently upon execution via email to the email address I last provided but may also request a printed copy by calling the office of [facility].    I understand that my insurance will be billed for this visit.   I have read or had this consent read to me.  I understand the contents of this consent, which adequately explains the benefits and risks of the Services being provided via telemedicine.   I have been provided ample opportunity to ask questions regarding this consent and the Services and have had my questions  answered to my satisfaction.  I give my informed consent for the services to be provided through the use of telemedicine in my medical care  By participating in this telemedicine visit I agree to the above.

## 2019-04-02 ENCOUNTER — Encounter: Payer: Self-pay | Admitting: Cardiovascular Disease

## 2019-04-02 ENCOUNTER — Telehealth (INDEPENDENT_AMBULATORY_CARE_PROVIDER_SITE_OTHER): Payer: Medicare Other | Admitting: Cardiovascular Disease

## 2019-04-02 VITALS — BP 127/72 | HR 60 | Ht 67.0 in | Wt 166.0 lb

## 2019-04-02 DIAGNOSIS — I429 Cardiomyopathy, unspecified: Secondary | ICD-10-CM

## 2019-04-02 DIAGNOSIS — I5042 Chronic combined systolic (congestive) and diastolic (congestive) heart failure: Secondary | ICD-10-CM | POA: Diagnosis not present

## 2019-04-02 DIAGNOSIS — I428 Other cardiomyopathies: Secondary | ICD-10-CM

## 2019-04-02 DIAGNOSIS — I480 Paroxysmal atrial fibrillation: Secondary | ICD-10-CM

## 2019-04-02 DIAGNOSIS — I10 Essential (primary) hypertension: Secondary | ICD-10-CM

## 2019-04-02 NOTE — Patient Instructions (Signed)

## 2019-04-02 NOTE — Addendum Note (Signed)
Addended by: Laurine Blazer on: 04/02/2019 10:14 AM   Modules accepted: Orders

## 2019-04-02 NOTE — Progress Notes (Signed)
Virtual Visit via Telephone Note   This visit type was conducted due to national recommendations for restrictions regarding the COVID-19 Pandemic (e.g. social distancing) in an effort to limit this patient's exposure and mitigate transmission in our community.  Due to her co-morbid illnesses, this patient is at least at moderate risk for complications without adequate follow up.  This format is felt to be most appropriate for this patient at this time.  The patient did not have access to video technology/had technical difficulties with video requiring transitioning to audio format only (telephone).  All issues noted in this document were discussed and addressed.  No physical exam could be performed with this format.  Please refer to the patient's chart for her  consent to telehealth for [facility].   Date:  04/02/2019   ID:  Heather [name], DOB Jul 20, 1940, MRN [id number]  Patient Location: Home Provider Location: Office  PCP:  Rosita Fire, MD  Cardiologist:  Kate Sable, MD  Electrophysiologist:  None   Evaluation Performed:  Follow-Up Visit  Chief Complaint:  CHF  History of Present Illness:    Heather [name] is a 78 y.o. female with chronic combined heart failure and paroxysmal atrial fibrillation.  Echocardiogram in May 2019 showed EF 30 to 35% with mild to moderate mitral regurgitation and possible laminated thrombus in the left ventricular apex.  Nuclear stress test was normal on 09/22/2018.  LVEF 60%.  The patient denies any symptoms of chest pain, palpitations, shortness of breath, lightheadedness, dizziness, leg swelling, orthopnea, PND, and syncope.  The patient does not have symptoms concerning for COVID-19 infection (fever, chills, cough, or new shortness of breath).    Past Medical History:  Diagnosis Date  . Arthritis    reports her joints are fine and she has no pain   . Ascending aortic aneurysm ([facility] [place]) 07/20/2014   4.1 cm by CT  . Atrial fib/flutter,  transient    post-op  . CHF (congestive heart failure) ([facility])    a. EF 30-35% by echo in 2013 --> similar results by repeat imaging in 2016 and 11/2017.  Marland Kitchen Constipation    corrects with diet  . Floater, vitreous    Right  . Hypertension   . 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 Pneumonia   . Psoriasis    Ankles  . Tobacco abuse    Past Surgical History:  Procedure Laterality Date  . CATARACT EXTRACTION Bilateral 2016  . COLONOSCOPY  01/22/2012   Procedure: COLONOSCOPY;  Surgeon: Danie Binder, MD;  [facility]: [facility];  Service: Endoscopy;  Laterality: N/A;  10:45  . COLONOSCOPY N/A 04/15/2017   Procedure: COLONOSCOPY;  Surgeon: Danie Binder, MD;  Location: [facility];  Service: Endoscopy;  Laterality: N/A;  10:30am  . IR RADIOLOGIST EVAL & MGMT  03/04/2018  . POLYPECTOMY  04/15/2017   Procedure: POLYPECTOMY;  Surgeon: Danie Binder, MD;  Location: [facility];  Service: Endoscopy;;  ascending colon  . RADIOLOGY WITH ANESTHESIA N/A 03/19/2018   Procedure: CT MICROWAVE THERMAL ABLATION WITH ANESTHESIA;  Surgeon: Jacqulynn Cadet, MD;  Location: [facility];  Service: Anesthesiology;  Laterality: N/A;  . right VATS (upper lobectomy)  08/29/11   Dr Roxan Hockey  . VIDEO BRONCHOSCOPY WITH ENDOBRONCHIAL NAVIGATION N/A 03/18/2015   Procedure: VIDEO BRONCHOSCOPY WITH ENDOBRONCHIAL NAVIGATION;  Surgeon: [name], MD;  Location: [facility];  Service: Thoracic;  Laterality: N/A;  Current Meds  Medication Sig  . alendronate (FOSAMAX) 70 MG tablet Take 70 mg by mouth every Monday.   . calcium-vitamin D (OSCAL WITH D) 500-200 MG-UNIT tablet Take 1 tablet by mouth 3 (three) times daily.  Marland Kitchen glucosamine-chondroitin 500-400 MG tablet Take 1 tablet by mouth daily.  . hydrochlorothiazide (MICROZIDE) 12.5 MG capsule Take 12.5 mg by mouth daily at 2 PM.   . meclizine (ANTIVERT) 12.5 MG tablet Take 12.5 mg by mouth daily at 12  noon.   . metoprolol succinate (TOPROL XL) 25 MG 24 hr tablet Take 1 tablet (25 mg total) by mouth 2 (two) times daily.  . Multiple Vitamins-Iron (MULTIVITAMINS WITH IRON) TABS Take 1 tablet by mouth daily.  . Omega-3 Fatty Acids (FISH OIL) 500 MG CAPS Take 500 mg by mouth daily.   . sacubitril-valsartan (ENTRESTO) 24-26 MG Take 1 tablet by mouth 2 (two) times daily.  . vitamin B-12 (CYANOCOBALAMIN) 500 MCG tablet Take 500 mcg by mouth daily.     Allergies:   Ibuprofen and Powder   Social History   Tobacco Use  . Smoking status: Former Smoker    Packs/day: 0.30    Years: 20.00    Pack years: 6.00    Types: Cigarettes    Quit date: 01/12/2014    Years since quitting: 5.2  . Smokeless tobacco: Never Used  Substance Use Topics  . Alcohol use: No  . Drug use: No     Family Hx: The patient's family history includes Heart disease in her brother, mother, and sister; Hypertension in her mother. There is no history of Colon cancer.  ROS:   Please see the history of present illness.     All other systems reviewed and are negative.   Prior CV studies:   The following studies were reviewed today:  See above  Labs/Other Tests and Data Reviewed:    EKG:  No ECG reviewed.  Recent Labs: 03/19/2019: ALT 8; BUN 12; Creatinine 0.99; Hemoglobin 11.2; Platelet Count 171; Potassium 3.5; Sodium 141   Recent Lipid Panel No results found for: CHOL, TRIG, HDL, CHOLHDL, LDLCALC, LDLDIRECT  Wt Readings from Last 3 Encounters:  04/02/19 166 lb (75.3 kg)  03/19/19 166 lb 6.4 oz (75.5 kg)  09/08/18 179 lb (81.2 kg)     Objective:    Vital Signs:  BP 127/72   Pulse 60   Ht 5\' 7"  (1.702 m)   Wt 166 lb (75.3 kg)   BMI 26.00 kg/m    VITAL SIGNS:  reviewed  ASSESSMENT & PLAN:    1.  Chronic combined heart failure/nonischemic cardiomyopathy: LVEF 30 to 35% in May 2019.  Nuclear stress test on 09/22/2018 was normal with a normal calculated LVEF of 60% suggesting a nonischemic etiology.     Nothing to suggest hypervolemia at this time.  Continue Toprol-XL.  I started Entresto on 09/08/2018 which would have been too early a period of time for it to have had any effect on cardiac function. She takes hydrochlorothiazide as needed for edema.  Blood pressure is normal today.  I will obtain a follow-up echocardiogram to assess for interval changes in cardiac structure and function.  If LVEF is normal, I will stop Entresto.  2.  Paroxysmal atrial fibrillation: This is remote and history.  Not on anticoagulation.  3.  Hypertension: BP is normal today.  No changes to therapy.    COVID-19 Education: The signs and symptoms of COVID-19 were discussed with the patient and how to seek  care for testing (follow up with PCP or arrange E-visit).  The importance of social distancing was discussed today.  Time:   Today, I have spent 10 minutes with the patient with telehealth technology discussing the above problems.     Medication Adjustments/Labs and Tests Ordered: Current medicines are reviewed at length with the patient today.  Concerns regarding medicines are outlined above.   Tests Ordered: No orders of the defined types were placed in this encounter.   Medication Changes: No orders of the defined types were placed in this encounter.   Follow Up:  Virtual Visit or In Person in 6 month(s)  Signed, Kate Sable, MD  04/02/2019 10:00 AM    [facility]

## 2019-04-20 DIAGNOSIS — I5022 Chronic systolic (congestive) heart failure: Secondary | ICD-10-CM | POA: Diagnosis not present

## 2019-04-20 DIAGNOSIS — I10 Essential (primary) hypertension: Secondary | ICD-10-CM | POA: Diagnosis not present

## 2019-04-23 DIAGNOSIS — Z23 Encounter for immunization: Secondary | ICD-10-CM | POA: Diagnosis not present

## 2019-05-07 ENCOUNTER — Other Ambulatory Visit: Payer: Self-pay

## 2019-05-07 ENCOUNTER — Ambulatory Visit (INDEPENDENT_AMBULATORY_CARE_PROVIDER_SITE_OTHER): Payer: Medicare Other

## 2019-05-07 DIAGNOSIS — I428 Other cardiomyopathies: Secondary | ICD-10-CM

## 2019-05-19 ENCOUNTER — Telehealth: Payer: Self-pay | Admitting: *Deleted

## 2019-05-19 NOTE — Telephone Encounter (Signed)
Notes recorded by Laurine Blazer, LPN on 67/67/2094 at 8:28 AM EST  Patient notified. Copy to pmd.  ------   Notes recorded by Laurine Blazer, LPN on 70/03/6282 at 66:29 AM EST  Left message to return call.   ------   Notes recorded by Laurine Blazer, LPN on 47/65/4650 at 6:24 PM EDT  Left message to return call.   ------   Notes recorded by Herminio Commons, MD on 05/08/2019 at 1:02 PM EDT  Cardiac function has improved and is only mildly decreased. There is mild aortic and mitral valve leakage. Delene Loll can be discontinued.

## 2019-05-21 DIAGNOSIS — I10 Essential (primary) hypertension: Secondary | ICD-10-CM | POA: Diagnosis not present

## 2019-05-21 DIAGNOSIS — I5022 Chronic systolic (congestive) heart failure: Secondary | ICD-10-CM | POA: Diagnosis not present

## 2019-06-16 DIAGNOSIS — I10 Essential (primary) hypertension: Secondary | ICD-10-CM | POA: Diagnosis not present

## 2019-06-16 DIAGNOSIS — I5022 Chronic systolic (congestive) heart failure: Secondary | ICD-10-CM | POA: Diagnosis not present

## 2019-06-16 DIAGNOSIS — M810 Age-related osteoporosis without current pathological fracture: Secondary | ICD-10-CM | POA: Diagnosis not present

## 2019-07-17 DIAGNOSIS — J449 Chronic obstructive pulmonary disease, unspecified: Secondary | ICD-10-CM | POA: Diagnosis not present

## 2019-07-17 DIAGNOSIS — I5022 Chronic systolic (congestive) heart failure: Secondary | ICD-10-CM | POA: Diagnosis not present

## 2019-08-17 DIAGNOSIS — I5022 Chronic systolic (congestive) heart failure: Secondary | ICD-10-CM | POA: Diagnosis not present

## 2019-08-17 DIAGNOSIS — I10 Essential (primary) hypertension: Secondary | ICD-10-CM | POA: Diagnosis not present

## 2019-08-26 ENCOUNTER — Telehealth: Payer: Self-pay | Admitting: Internal Medicine

## 2019-08-26 NOTE — Telephone Encounter (Signed)
Per 2/16 sch msg. Pt aware of new date and time for labs

## 2019-09-14 ENCOUNTER — Other Ambulatory Visit: Payer: Self-pay

## 2019-09-14 ENCOUNTER — Inpatient Hospital Stay: Payer: Medicare Other | Attending: Internal Medicine

## 2019-09-14 ENCOUNTER — Ambulatory Visit (HOSPITAL_COMMUNITY)
Admission: RE | Admit: 2019-09-14 | Discharge: 2019-09-14 | Disposition: A | Discharge status: Home / Self Care | Payer: Medicare Other | Source: Ambulatory Visit | Attending: Internal Medicine | Admitting: Internal Medicine

## 2019-09-14 DIAGNOSIS — J449 Chronic obstructive pulmonary disease, unspecified: Secondary | ICD-10-CM | POA: Diagnosis not present

## 2019-09-14 DIAGNOSIS — Z902 Acquired absence of lung [part of]: Secondary | ICD-10-CM | POA: Diagnosis not present

## 2019-09-14 DIAGNOSIS — I5022 Chronic systolic (congestive) heart failure: Secondary | ICD-10-CM | POA: Diagnosis not present

## 2019-09-14 DIAGNOSIS — N281 Cyst of kidney, acquired: Secondary | ICD-10-CM | POA: Diagnosis not present

## 2019-09-14 DIAGNOSIS — M1990 Unspecified osteoarthritis, unspecified site: Secondary | ICD-10-CM | POA: Diagnosis not present

## 2019-09-14 DIAGNOSIS — C3490 Malignant neoplasm of unspecified part of unspecified bronchus or lung: Secondary | ICD-10-CM

## 2019-09-14 DIAGNOSIS — C3412 Malignant neoplasm of upper lobe, left bronchus or lung: Secondary | ICD-10-CM | POA: Diagnosis not present

## 2019-09-14 DIAGNOSIS — E041 Nontoxic single thyroid nodule: Secondary | ICD-10-CM | POA: Insufficient documentation

## 2019-09-14 DIAGNOSIS — I2510 Atherosclerotic heart disease of native coronary artery without angina pectoris: Secondary | ICD-10-CM | POA: Insufficient documentation

## 2019-09-14 DIAGNOSIS — Z79899 Other long term (current) drug therapy: Secondary | ICD-10-CM | POA: Diagnosis not present

## 2019-09-14 DIAGNOSIS — Z923 Personal history of irradiation: Secondary | ICD-10-CM | POA: Diagnosis not present

## 2019-09-14 DIAGNOSIS — Z886 Allergy status to analgesic agent status: Secondary | ICD-10-CM | POA: Insufficient documentation

## 2019-09-14 DIAGNOSIS — K8020 Calculus of gallbladder without cholecystitis without obstruction: Secondary | ICD-10-CM | POA: Diagnosis not present

## 2019-09-14 DIAGNOSIS — R918 Other nonspecific abnormal finding of lung field: Secondary | ICD-10-CM | POA: Diagnosis not present

## 2019-09-14 DIAGNOSIS — I700 Atherosclerosis of aorta: Secondary | ICD-10-CM | POA: Diagnosis not present

## 2019-09-14 LAB — CMP (CANCER CENTER ONLY)
ALT: 10 U/L (ref 0–44)
AST: 17 U/L (ref 15–41)
Albumin: 3.9 g/dL (ref 3.5–5.0)
Alkaline Phosphatase: 63 U/L (ref 38–126)
Anion gap: 9 (ref 5–15)
BUN: 19 mg/dL (ref 8–23)
CO2: 29 mmol/L (ref 22–32)
Calcium: 9.3 mg/dL (ref 8.9–10.3)
Chloride: 105 mmol/L (ref 98–111)
Creatinine: 1.11 mg/dL — ABNORMAL HIGH (ref 0.44–1.00)
GFR, Est AFR Am: 55 mL/min — ABNORMAL LOW (ref >60)
GFR, Estimated: 47 mL/min — ABNORMAL LOW (ref >60)
Glucose, Bld: 96 mg/dL (ref 70–99)
Potassium: 3.8 mmol/L (ref 3.5–5.1)
Sodium: 143 mmol/L (ref 135–145)
Total Bilirubin: 0.7 mg/dL (ref 0.3–1.2)
Total Protein: 7.1 g/dL (ref 6.5–8.1)

## 2019-09-14 LAB — CBC WITH DIFFERENTIAL (CANCER CENTER ONLY)
Abs Immature Granulocytes: 0.01 10*3/uL (ref 0.00–0.07)
Basophils Absolute: 0 10*3/uL (ref 0.0–0.1)
Basophils Relative: 0 %
Eosinophils Absolute: 0.1 10*3/uL (ref 0.0–0.5)
Eosinophils Relative: 2 %
HCT: 37.7 % (ref 36.0–46.0)
Hemoglobin: 12.4 g/dL (ref 12.0–15.0)
Immature Granulocytes: 0 %
Lymphocytes Relative: 41 %
Lymphs Abs: 3 10*3/uL (ref 0.7–4.0)
MCH: 30.8 pg (ref 26.0–34.0)
MCHC: 32.9 g/dL (ref 30.0–36.0)
MCV: 93.5 fL (ref 80.0–100.0)
Monocytes Absolute: 0.4 10*3/uL (ref 0.1–1.0)
Monocytes Relative: 6 %
Neutro Abs: 3.7 10*3/uL (ref 1.7–7.7)
Neutrophils Relative %: 51 %
Platelet Count: 200 10*3/uL (ref 150–400)
RBC: 4.03 MIL/uL (ref 3.87–5.11)
RDW: 13.7 % (ref 11.5–15.5)
WBC Count: 7.2 10*3/uL (ref 4.0–10.5)
nRBC: 0 % (ref 0.0–0.2)

## 2019-09-14 MED ORDER — IOHEXOL 300 MG/ML  SOLN
75.0000 mL | Freq: Once | INTRAMUSCULAR | Status: AC | PRN
  Administered 2019-09-14: 75 mL via INTRAVENOUS

## 2019-09-14 MED ORDER — SODIUM CHLORIDE (PF) 0.9 % IJ SOLN
INTRAMUSCULAR | Status: AC
  Filled 2019-09-14: qty 50

## 2019-09-15 ENCOUNTER — Inpatient Hospital Stay (HOSPITAL_BASED_OUTPATIENT_CLINIC_OR_DEPARTMENT_OTHER): Payer: Medicare Other | Admitting: Internal Medicine

## 2019-09-15 ENCOUNTER — Other Ambulatory Visit: Payer: Self-pay

## 2019-09-15 ENCOUNTER — Other Ambulatory Visit: Payer: Medicare Other

## 2019-09-15 ENCOUNTER — Encounter: Payer: Self-pay | Admitting: Internal Medicine

## 2019-09-15 ENCOUNTER — Telehealth: Payer: Self-pay | Admitting: Internal Medicine

## 2019-09-15 VITALS — BP 137/86 | HR 63 | Temp 98.7°F | Resp 18 | Ht 67.0 in | Wt 168.2 lb

## 2019-09-15 DIAGNOSIS — M1990 Unspecified osteoarthritis, unspecified site: Secondary | ICD-10-CM | POA: Diagnosis not present

## 2019-09-15 DIAGNOSIS — Z886 Allergy status to analgesic agent status: Secondary | ICD-10-CM | POA: Diagnosis not present

## 2019-09-15 DIAGNOSIS — C3491 Malignant neoplasm of unspecified part of right bronchus or lung: Secondary | ICD-10-CM | POA: Diagnosis not present

## 2019-09-15 DIAGNOSIS — K8020 Calculus of gallbladder without cholecystitis without obstruction: Secondary | ICD-10-CM | POA: Diagnosis not present

## 2019-09-15 DIAGNOSIS — I700 Atherosclerosis of aorta: Secondary | ICD-10-CM | POA: Diagnosis not present

## 2019-09-15 DIAGNOSIS — C3412 Malignant neoplasm of upper lobe, left bronchus or lung: Secondary | ICD-10-CM

## 2019-09-15 DIAGNOSIS — Z902 Acquired absence of lung [part of]: Secondary | ICD-10-CM | POA: Diagnosis not present

## 2019-09-15 DIAGNOSIS — Z923 Personal history of irradiation: Secondary | ICD-10-CM | POA: Diagnosis not present

## 2019-09-15 DIAGNOSIS — I2510 Atherosclerotic heart disease of native coronary artery without angina pectoris: Secondary | ICD-10-CM | POA: Diagnosis not present

## 2019-09-15 DIAGNOSIS — C3490 Malignant neoplasm of unspecified part of unspecified bronchus or lung: Secondary | ICD-10-CM | POA: Diagnosis not present

## 2019-09-15 DIAGNOSIS — Z79899 Other long term (current) drug therapy: Secondary | ICD-10-CM | POA: Diagnosis not present

## 2019-09-15 DIAGNOSIS — N281 Cyst of kidney, acquired: Secondary | ICD-10-CM | POA: Diagnosis not present

## 2019-09-15 DIAGNOSIS — E041 Nontoxic single thyroid nodule: Secondary | ICD-10-CM | POA: Diagnosis not present

## 2019-09-15 NOTE — Patient Instructions (Signed)

## 2019-09-15 NOTE — Progress Notes (Signed)
[place] [phone]   Fax:([phone]) [place], [place], MD [address] [place] [address]  DIAGNOSIS:  1) Stage IA non-small cell lung cancer diagnosed in March 2017.  2) She now has recurrent disease in the left upper lobe suspicious for stage IA.  She also has some other small pulmonary nodules that could be low-grade adenocarcinoma.  PRIOR THERAPY:  1) status post right upper lobectomy with lymph node dissection on September 24, 2011 2) status post stereotactic radiotherapy to the left upper lobe lung nodule in February 2019.   CURRENT THERAPY: Observation.  INTERVAL HISTORY: Heather [name] 79 y.o. female returns to the clinic today for follow-up visit.  The patient is feeling fine today with no concerning complaints.  She denied having any chest pain, shortness of breath, cough or hemoptysis.  She denied having any fever or chills.  She has no nausea, vomiting, diarrhea or constipation.  She denied having any headache or visual changes.  She received her Covid vaccine recently.  The patient had repeat CT scan of the chest performed recently and she is here for evaluation and discussion of her scan results.  MEDICAL HISTORY: Past Medical History:  Diagnosis Date  . Arthritis    reports her joints are fine and she has no pain   . Ascending aortic aneurysm ([place]) 07/20/2014   4.1 cm by CT  . Atrial fib/flutter, transient    post-op  . CHF (congestive heart failure) ([place])    a. EF 30-35% by echo in 2013 --> similar results by repeat imaging in 2016 and 11/2017.  Marland Kitchen Constipation    corrects with diet  . Floater, vitreous    Right  . Hypertension   . Nonsquamous nonsmall cell neoplasm of lung ([place]) 09/2011   Stage IB- s/p right upper lobectomy  . Pneumonia   . Psoriasis    Ankles  . Tobacco abuse     ALLERGIES:  is allergic to ibuprofen and powder.  MEDICATIONS:  Current Outpatient Medications    Medication Sig Dispense Refill  . alendronate (FOSAMAX) 70 MG tablet Take 70 mg by mouth every Monday.   3  . calcium-vitamin D (OSCAL WITH D) 500-200 MG-UNIT tablet Take 1 tablet by mouth 3 (three) times daily.    Marland Kitchen glucosamine-chondroitin 500-400 MG tablet Take 1 tablet by mouth daily.    . hydrochlorothiazide (MICROZIDE) 12.5 MG capsule Take 12.5 mg by mouth daily at 2 PM.   3  . meclizine (ANTIVERT) 12.5 MG tablet Take 12.5 mg by mouth daily at 12 noon.     . metoprolol succinate (TOPROL XL) 25 MG 24 hr tablet Take 1 tablet (25 mg total) by mouth 2 (two) times daily. 60 tablet 6  . Multiple Vitamins-Iron (MULTIVITAMINS WITH IRON) TABS Take 1 tablet by mouth daily.    . Omega-3 Fatty Acids (FISH OIL) 500 MG CAPS Take 500 mg by mouth daily.     . vitamin B-12 (CYANOCOBALAMIN) 500 MCG tablet Take 500 mcg by mouth daily.     No current facility-administered medications for this visit.    SURGICAL HISTORY:  Past Surgical History:  Procedure Laterality Date  . CATARACT EXTRACTION Bilateral 2016  . COLONOSCOPY  01/22/2012   Procedure: COLONOSCOPY;  Surgeon: Danie Binder, MD;  [facility];  Service: Endoscopy;  Laterality: N/A;  10:45  . COLONOSCOPY N/A 04/15/2017   Procedure: COLONOSCOPY;  Surgeon: Danie Binder,  MD;  [facility];  Service: Endoscopy;  Laterality: N/A;  10:30am  . IR RADIOLOGIST EVAL & MGMT  03/04/2018  . POLYPECTOMY  04/15/2017   Procedure: POLYPECTOMY;  Surgeon: Danie Binder, MD;  [facility];  Service: Endoscopy;;  ascending colon  . RADIOLOGY WITH ANESTHESIA N/A 03/19/2018   Procedure: CT MICROWAVE THERMAL ABLATION WITH ANESTHESIA;  Surgeon: Jacqulynn Cadet, MD;  [facility];  Service: Anesthesiology;  Laterality: N/A;  . right VATS (upper lobectomy)  08/29/11   Dr Roxan Hockey  . VIDEO BRONCHOSCOPY WITH ENDOBRONCHIAL NAVIGATION N/A 03/18/2015   Procedure: VIDEO BRONCHOSCOPY WITH ENDOBRONCHIAL NAVIGATION;  Surgeon: [name], MD;  Location: [facility];  Service: Thoracic;  Laterality: N/A;    REVIEW OF SYSTEMS:  A comprehensive review of systems was negative.   PHYSICAL EXAMINATION: General appearance: alert, cooperative and no distress Head: Normocephalic, without obvious abnormality, atraumatic Neck: no adenopathy, no JVD, supple, symmetrical, trachea midline and thyroid not enlarged, symmetric, no tenderness/mass/nodules Lymph nodes: Cervical, supraclavicular, and axillary nodes normal. Resp: clear to auscultation bilaterally Back: symmetric, no curvature. ROM normal. No CVA tenderness. Cardio: regular rate and rhythm, S1, S2 normal, no murmur, click, rub or gallop GI: soft, non-tender; bowel sounds normal; no masses,  no organomegaly Extremities: extremities normal, atraumatic, no cyanosis or edema  ECOG PERFORMANCE STATUS: 1 - Symptomatic but completely ambulatory  Blood pressure 137/86, pulse 63, temperature 98.7 F (37.1 C), temperature source Temporal, resp. rate 18, height 5\' 7"  (1.702 m), weight 168 lb 3.2 oz (76.3 kg), SpO2 99 %.  LABORATORY DATA: Lab Results  Component Value Date   WBC 7.2 09/14/2019   HGB 12.4 09/14/2019   HCT 37.7 09/14/2019   MCV 93.5 09/14/2019   PLT 200 09/14/2019      Chemistry      Component Value Date/Time   NA 143 09/14/2019 1013   K 3.8 09/14/2019 1013   CL 105 09/14/2019 1013   CO2 29 09/14/2019 1013   BUN 19 09/14/2019 1013   CREATININE 1.11 (H) 09/14/2019 1013      Component Value Date/Time   CALCIUM 9.3 09/14/2019 1013   ALKPHOS 63 09/14/2019 1013   AST 17 09/14/2019 1013   ALT 10 09/14/2019 1013   BILITOT 0.7 09/14/2019 1013       RADIOGRAPHIC STUDIES: CT Chest W Contrast  Result Date: 09/14/2019 CLINICAL DATA:  Status post right upper lobectomy in 2013 for adenocarcinoma. Status post radiation in the left upper lobe in February 2019. Follow-up. EXAM: CT CHEST WITH CONTRAST TECHNIQUE: Multidetector CT imaging of the chest was performed  during intravenous contrast administration. CONTRAST:  70mL OMNIPAQUE IOHEXOL 300 MG/ML  SOLN COMPARISON:  03/19/2019 FINDINGS: Cardiovascular: The heart is normal in size. No pericardial effusion. No evidence of thoracic aortic aneurysm. Atherosclerotic calcifications of the aortic arch. Coronary atherosclerosis the LAD. Mediastinum/Nodes: No suspicious mediastinal lymphadenopathy. Subcentimeter left thyroid nodule. Lungs/Pleura: Status post right upper lobectomy. Nodular opacity in the medial left upper lobe measuring approximately 9 x 17 mm (series 7/image 58), with surrounding ground-glass opacities/radiation changes, corresponding to the patient's known treated tumor. This previously measured 9 x 18 mm, unchanged. Multifocal ground-glass and subsolid nodular opacities throughout the lungs bilaterally, unchanged. Index lesions include a 2.7 cm ground-glass nodule with 4 mm solid component in the left lung apex (series 7/image 29), a 1.7 cm predominantly solid nodule in the medial right lower lobe (series 7/image 47), and a 1.8 cm ground-glass nodule in the lateral left  lower lobe (series 7/image 93). These findings raise concern for multifocal noninvasive adenocarcinoma but are unchanged. No focal consolidation. No pleural effusion or pneumothorax. Upper Abdomen: Visualized upper abdomen is notable for left renal cysts, cholelithiasis, and vascular calcifications. Musculoskeletal: Mild degenerative changes of the visualized thoracolumbar spine. IMPRESSION: Status post right upper lobectomy. Stable treated tumor in the medial left upper lobe. Stable ground-glass/sub solid nodules throughout the lungs bilaterally, raising concern for multifocal noninvasive adenocarcinoma, unchanged. Representative index lesions are described above. Continued annual follow-up is suggested. No evidence of new/progressive disease. Aortic Atherosclerosis (ICD10-I70.0). Electronically Signed   By: Julian Hy M.D.   On:  09/14/2019 13:28    ASSESSMENT AND PLAN: This is a very pleasant 79 years old African-American female with recurrent non-small cell lung cancer presented with right upper lobe lung nodule that was previously treated with a stereotactic radiotherapy. The patient has been doing well with no concerning complaints. She had repeat CT scan of the chest performed recently.  I personally and independently reviewed the scan and discussed the results with the patient today.  Her scan showed no concerning findings for disease progression but she continues to have the stable groundglass nodules bilaterally. I recommended for her to continue on observation with repeat CT scan of the chest in 6 months.  She was advised to call immediately if she has any concerning symptoms in the interval. The patient voices understanding of current disease status and treatment options and is in agreement with the current care plan. All questions were answered. The patient knows to call the clinic with any problems, questions or concerns. We can certainly see the patient much sooner if necessary.   Disclaimer: This note was dictated with voice recognition software. Similar sounding words can inadvertently be transcribed and may not be corrected upon review.

## 2019-09-15 NOTE — Telephone Encounter (Signed)
Scheduled per 03/09 los, patient has received copy of calender.

## 2019-10-13 ENCOUNTER — Encounter: Payer: Self-pay | Admitting: Gastroenterology

## 2019-10-13 DIAGNOSIS — Z1389 Encounter for screening for other disorder: Secondary | ICD-10-CM | POA: Diagnosis not present

## 2019-10-13 DIAGNOSIS — I10 Essential (primary) hypertension: Secondary | ICD-10-CM | POA: Diagnosis not present

## 2019-10-13 DIAGNOSIS — Z0001 Encounter for general adult medical examination with abnormal findings: Secondary | ICD-10-CM | POA: Diagnosis not present

## 2019-10-13 DIAGNOSIS — I5022 Chronic systolic (congestive) heart failure: Secondary | ICD-10-CM | POA: Diagnosis not present

## 2019-10-13 DIAGNOSIS — J449 Chronic obstructive pulmonary disease, unspecified: Secondary | ICD-10-CM | POA: Diagnosis not present

## 2019-10-23 ENCOUNTER — Other Ambulatory Visit: Payer: Self-pay

## 2019-10-23 ENCOUNTER — Other Ambulatory Visit (HOSPITAL_COMMUNITY)
Admission: RE | Admit: 2019-10-23 | Discharge: 2019-10-23 | Disposition: A | Discharge status: Home / Self Care | Payer: Medicare Other | Source: Ambulatory Visit | Attending: Internal Medicine | Admitting: Internal Medicine

## 2019-10-23 DIAGNOSIS — Z0001 Encounter for general adult medical examination with abnormal findings: Secondary | ICD-10-CM | POA: Diagnosis not present

## 2019-10-23 DIAGNOSIS — I10 Essential (primary) hypertension: Secondary | ICD-10-CM | POA: Insufficient documentation

## 2019-10-23 DIAGNOSIS — E785 Hyperlipidemia, unspecified: Secondary | ICD-10-CM | POA: Insufficient documentation

## 2019-10-23 DIAGNOSIS — I5022 Chronic systolic (congestive) heart failure: Secondary | ICD-10-CM | POA: Insufficient documentation

## 2019-10-23 LAB — LIPID PANEL
Cholesterol: 164 mg/dL (ref 0–200)
HDL: 58 mg/dL (ref >40)
LDL Cholesterol: 96 mg/dL (ref 0–99)
Total CHOL/HDL Ratio: 2.8 RATIO
Triglycerides: 48 mg/dL (ref <150)
VLDL: 10 mg/dL (ref 0–40)

## 2019-10-23 LAB — BASIC METABOLIC PANEL
Anion gap: 9 (ref 5–15)
BUN: 17 mg/dL (ref 8–23)
CO2: 27 mmol/L (ref 22–32)
Calcium: 9.4 mg/dL (ref 8.9–10.3)
Chloride: 105 mmol/L (ref 98–111)
Creatinine, Ser: 0.97 mg/dL (ref 0.44–1.00)
GFR calc Af Amer: >60 mL/min (ref >60)
GFR calc non Af Amer: 56 mL/min — ABNORMAL LOW (ref >60)
Glucose, Bld: 102 mg/dL — ABNORMAL HIGH (ref 70–99)
Potassium: 3.6 mmol/L (ref 3.5–5.1)
Sodium: 141 mmol/L (ref 135–145)

## 2019-10-23 LAB — CBC WITH DIFFERENTIAL/PLATELET
Abs Immature Granulocytes: 0.01 10*3/uL (ref 0.00–0.07)
Basophils Absolute: 0 10*3/uL (ref 0.0–0.1)
Basophils Relative: 0 %
Eosinophils Absolute: 0.1 10*3/uL (ref 0.0–0.5)
Eosinophils Relative: 2 %
HCT: 37.5 % (ref 36.0–46.0)
Hemoglobin: 12.2 g/dL (ref 12.0–15.0)
Immature Granulocytes: 0 %
Lymphocytes Relative: 46 %
Lymphs Abs: 3.2 10*3/uL (ref 0.7–4.0)
MCH: 31.2 pg (ref 26.0–34.0)
MCHC: 32.5 g/dL (ref 30.0–36.0)
MCV: 95.9 fL (ref 80.0–100.0)
Monocytes Absolute: 0.4 10*3/uL (ref 0.1–1.0)
Monocytes Relative: 6 %
Neutro Abs: 3.1 10*3/uL (ref 1.7–7.7)
Neutrophils Relative %: 46 %
Platelets: 188 10*3/uL (ref 150–400)
RBC: 3.91 MIL/uL (ref 3.87–5.11)
RDW: 13.9 % (ref 11.5–15.5)
WBC: 6.9 10*3/uL (ref 4.0–10.5)
nRBC: 0 % (ref 0.0–0.2)

## 2019-10-23 LAB — HEPATIC FUNCTION PANEL
ALT: 13 U/L (ref 0–44)
AST: 22 U/L (ref 15–41)
Albumin: 4.1 g/dL (ref 3.5–5.0)
Alkaline Phosphatase: 56 U/L (ref 38–126)
Bilirubin, Direct: 0.1 mg/dL (ref 0.0–0.2)
Indirect Bilirubin: 0.9 mg/dL (ref 0.3–0.9)
Total Bilirubin: 1 mg/dL (ref 0.3–1.2)
Total Protein: 7.2 g/dL (ref 6.5–8.1)

## 2019-10-26 ENCOUNTER — Other Ambulatory Visit: Payer: Self-pay

## 2019-10-26 ENCOUNTER — Ambulatory Visit (INDEPENDENT_AMBULATORY_CARE_PROVIDER_SITE_OTHER): Payer: Medicare Other | Admitting: Family Medicine

## 2019-10-26 ENCOUNTER — Encounter: Payer: Self-pay | Admitting: Family Medicine

## 2019-10-26 VITALS — BP 124/68 | HR 86 | Ht 67.0 in | Wt 168.0 lb

## 2019-10-26 DIAGNOSIS — I10 Essential (primary) hypertension: Secondary | ICD-10-CM

## 2019-10-26 DIAGNOSIS — I5042 Chronic combined systolic (congestive) and diastolic (congestive) heart failure: Secondary | ICD-10-CM

## 2019-10-26 NOTE — Progress Notes (Addendum)
Cardiology Office Note  Date: 10/26/2019   ID: Heather [name], DOB 29-Sep-1940, MRN [id number]  PCP:  Rosita Fire, MD  Cardiologist:  Kate Sable, MD Electrophysiologist:  None   Chief Complaint: Follow-up chronic combined heart failure, paroxysmal atrial fibrillation, hypertension.  History of Present Illness: Heather [name] is a 79 y.o. female with a history of chronic combined heart failure, paroxysmal atrial fibrillation, hypertension.  Last echocardiogram in May 2019 showed EF of 30 to 35% with mild to moderate MR and possible laminated thrombus in the left ventricular apex.  A Lexiscan stress test was ordered for possible ischemic etiology for low EF.  Lexiscan stress test was electrically negative for ischemia with no ischemia or scar and normal perfusion EF was 60%.  She started Entresto 24/26 mg daily.   Last encounter with Dr. Bronson Ing via telemedicine on April 02, 2019.  She denied any symptoms of chest pain, shortness of breath, palpitations, lightheadedness dizziness, swelling, orthopnea PND, or syncope. Her Delene Loll was stopped.  Echocardiogram on 05/07/2019 showed an improvement in EF of 40 to 45%.  Grade 1 diastolic dysfunction, mild MR, mild AR, trivial TR, moderately dilated LA.  She presents today with no particular complaints.  She denies any progressive anginal or exertional symptoms, palpitations or arrhythmias, orthostatic symptoms, TIA or CVA-like symptoms, palpitations or arrhythmias.  Denies any claudication-like symptoms, DVT or PE-like symptoms, or lower extremity edema.   Past Medical History:  Diagnosis Date  . Arthritis    reports her joints are fine and she has no pain   . Ascending aortic aneurysm ([place]) 07/20/2014   4.1 cm by CT  . Atrial fib/flutter, transient    post-op  . CHF (congestive heart failure) ([place])    a. EF 30-35% by echo in 2013 --> similar results by repeat imaging in 2016 and 11/2017.  Marland Kitchen Constipation    corrects  with diet  . Floater, vitreous    Right  . Hypertension   . Nonsquamous nonsmall cell neoplasm of lung ([place]) 09/2011   Stage IB- s/p right upper lobectomy  . Pneumonia   . Psoriasis    Ankles  . Tobacco abuse     Past Surgical History:  Procedure Laterality Date  . CATARACT EXTRACTION Bilateral 2016  . COLONOSCOPY  01/22/2012   Procedure: COLONOSCOPY;  Surgeon: Danie Binder, MD;  [facility];  Service: Endoscopy;  Laterality: N/A;  10:45  . COLONOSCOPY N/A 04/15/2017   Procedure: COLONOSCOPY;  Surgeon: Danie Binder, MD;  [facility];  Service: Endoscopy;  Laterality: N/A;  10:30am  . IR RADIOLOGIST EVAL & MGMT  03/04/2018  . POLYPECTOMY  04/15/2017   Procedure: POLYPECTOMY;  Surgeon: Danie Binder, MD;  [facility];  Service: Endoscopy;;  ascending colon  . RADIOLOGY WITH ANESTHESIA N/A 03/19/2018   Procedure: CT MICROWAVE THERMAL ABLATION WITH ANESTHESIA;  Surgeon: Jacqulynn Cadet, MD;  [facility];  Service: Anesthesiology;  Laterality: N/A;  . right VATS (upper lobectomy)  08/29/11   Dr Roxan Hockey  . VIDEO BRONCHOSCOPY WITH ENDOBRONCHIAL NAVIGATION N/A 03/18/2015   Procedure: VIDEO BRONCHOSCOPY WITH ENDOBRONCHIAL NAVIGATION;  Surgeon: [name], MD;  Location: [facility];  Service: Thoracic;  Laterality: N/A;    Current Outpatient Medications  Medication Sig Dispense Refill  . alendronate (FOSAMAX) 70 MG tablet Take 70 mg by mouth every Monday.   3  . calcium-vitamin D (OSCAL WITH D) 500-200 MG-UNIT tablet Take 1 tablet by mouth 3 (  three) times daily.    Marland Kitchen glucosamine-chondroitin 500-400 MG tablet Take 1 tablet by mouth daily.    . hydrochlorothiazide (MICROZIDE) 12.5 MG capsule Take 12.5 mg by mouth daily at 2 PM.   3  . meclizine (ANTIVERT) 12.5 MG tablet Take 12.5 mg by mouth daily at 12 noon.     . metoprolol succinate (TOPROL XL) 25 MG 24 hr tablet Take 1 tablet (25 mg total) by mouth 2 (two) times daily. 60 tablet 6  .  Multiple Vitamins-Iron (MULTIVITAMINS WITH IRON) TABS Take 1 tablet by mouth daily.    . Omega-3 Fatty Acids (FISH OIL) 500 MG CAPS Take 500 mg by mouth daily.     . vitamin B-12 (CYANOCOBALAMIN) 500 MCG tablet Take 500 mcg by mouth daily.     No current facility-administered medications for this visit.   Allergies:  Ibuprofen and Powder   Social History: The patient  reports that she has been smoking cigarettes. She has a 6.00 pack-year smoking history. She has never used smokeless tobacco. She reports that she does not drink alcohol or use drugs.   Family History: The patient's family history includes Heart disease in her brother, mother, and sister; Hypertension in her mother.   ROS:  Please see the history of present illness. Otherwise, complete review of systems is positive for none.  All other systems are reviewed and negative.   Physical Exam: VS:  BP 124/68   Pulse 86   Ht 5\' 7"  (1.702 m)   Wt 168 lb (76.2 kg)   SpO2 97%   BMI 26.31 kg/m , BMI Body mass index is 26.31 kg/m.  Wt Readings from Last 3 Encounters:  10/26/19 168 lb (76.2 kg)  09/15/19 168 lb 3.2 oz (76.3 kg)  04/02/19 166 lb (75.3 kg)    General: Patient appears comfortable at rest. Neck: Supple, no elevated JVP or carotid bruits, no thyromegaly. Lungs: Clear to auscultation, nonlabored breathing at rest. Cardiac: Regular rate and rhythm, no S3 or significant systolic murmur, no pericardial rub. Extremities: No pitting edema, distal pulses 2+. Skin: Warm and dry. Musculoskeletal: No kyphosis. Neuropsychiatric: Alert and oriented x3, affect grossly appropriate.  ECG:  An ECG dated 10/26/2019 was personally reviewed today and demonstrated:  Sinus rhythm with premature supraventricular complexes with frequent PVCs, T wave abnormality, consider inferior ischemia, heart rate of 86  Recent Labwork: 10/23/2019: ALT 13; AST 22; BUN 17; Creatinine, Ser 0.97; Hemoglobin 12.2; Platelets 188; Potassium 3.6; Sodium 141       Component Value Date/Time   CHOL 164 10/23/2019 0850   TRIG 48 10/23/2019 0850   HDL 58 10/23/2019 0850   CHOLHDL 2.8 10/23/2019 0850   VLDL 10 10/23/2019 0850   LDLCALC 96 10/23/2019 0850    Other Studies Reviewed Today:  Echocardiogram 05/07/2019  1. Left ventricular ejection fraction, by visual estimation, is 45 to 50%. The left ventricle has mildly decreased function. There is no left ventricular hypertrophy. 2. Apical septal segment, apical inferior segment, and apex are abnormal. 3. Left ventricular diastolic parameters are consistent with Grade I diastolic dysfunction (impaired relaxation). 4. Mildly dilated left ventricular internal cavity size. 5. Global right ventricle has normal systolic function.The right ventricular size is normal. No increase in right ventricular wall thickness. 6. Left atrial size was moderately dilated. 7. Right atrial size was normal. 8. Mild aortic valve annular calcification. 9. The mitral valve is myxomatous with mild bileaflet prolapse. Mild mitral valve regurgitation. 10. The tricuspid valve is grossly normal. Tricuspid  valve regurgitation is trivial. 11. The aortic valve is tricuspid. Aortic valve regurgitation is mild. Mild aortic valve sclerosis without stenosis. 12. The pulmonic valve was grossly normal. Pulmonic valve regurgitation is mild. 13. Normal pulmonary artery systolic pressure. 14. The tricuspid regurgitant velocity is 2.20 m/s, and with an assumed right atrial pressure of 3 mmHg, the estimated right ventricular systolic pressure is normal at 22.4 mmHg. 15. The inferior vena cava is normal in size with greater than 50% respiratory variability, suggesting right atrial pressure of 3 mmHg. In comparison to the previous echocardiogram(s): Unable to compare directly with the images from prior study May 2019.  Assessment and Plan:  1. Chronic combined systolic (congestive) and diastolic (congestive) heart failure ([place])   2.  Essential hypertension    1. Chronic combined systolic (congestive) and diastolic (congestive) heart failure ([facility]) Last echocardiogram showed improvement in EF of 40 to 45%.  She had mild MR, trivial TR, mild PR, moderately dilated LA, mild AR.  Patient denies any recent shortness of breath, increase weight gain, or lower extremity edema.  We will monitor with future echocardiograms.  Continue HCTZ 12.5 mg daily, Toprol-XL 25 mg daily.  2. Essential hypertension She is normotensive today with a blood pressure of 124/68.  Continue HCTZ 12.5 mg daily.  Medication Adjustments/Labs and Tests Ordered: Current medicines are reviewed at length with the patient today.  Concerns regarding medicines are outlined above.   Disposition: Follow-up Dr. Bronson Ing or [facility] 6 months  Signed, Levell July, NP 10/26/2019 4:47 PM    [facility] [place], [place], [place] [address] Phone: [phone]; Fax: [phone]

## 2019-10-26 NOTE — Patient Instructions (Addendum)

## 2019-10-27 NOTE — Addendum Note (Signed)
Addended by: Merlene Laughter on: 10/27/2019 09:50 AM   Modules accepted: Orders

## 2019-11-06 ENCOUNTER — Ambulatory Visit (INDEPENDENT_AMBULATORY_CARE_PROVIDER_SITE_OTHER): Payer: Medicare Other | Admitting: Gastroenterology

## 2019-11-06 ENCOUNTER — Encounter: Payer: Self-pay | Admitting: Gastroenterology

## 2019-11-06 ENCOUNTER — Other Ambulatory Visit: Payer: Self-pay

## 2019-11-06 DIAGNOSIS — K642 Third degree hemorrhoids: Secondary | ICD-10-CM

## 2019-11-06 MED ORDER — HYDROCORTISONE (PERIANAL) 2.5 % EX CREA: 1.0000 "application " | TOPICAL_CREAM | Freq: Two times a day (BID) | CUTANEOUS | 1 refills | Status: DC

## 2019-11-06 NOTE — Patient Instructions (Signed)
I have sent in a cream to use per rectum twice a day.   You can get lidocaine ointment over the counter and apply per rectum as needed, too.   I will see you back in about 3-4 weeks to do a hemorrhoid banding.   It was a pleasure to see you today. I want to create trusting relationships with patients to provide genuine, compassionate, and quality care. I value your feedback. If you receive a survey regarding your visit,  I greatly appreciate you taking time to fill this out.   Annitta Needs, PhD, ANP-BC [facility]

## 2019-11-06 NOTE — Progress Notes (Signed)
Referring Provider: Rosita Fire, MD Primary Care Physician:  Rosita Fire, MD Primary GI: Dr. Oneida Alar   Chief Complaint  Patient presents with  . Hemorrhoids    REF by Dr. Legrand Rams    HPI:   Heather [name] is a 79 y.o. female presenting today with a history of multiple adenomas in 2018 (Surveillance 3-5 years if benefits outweigh risks), constipation, now returning at request of Dr.Fanta due to hemorrhoids.    Prolapsing hemorrhoids and feels pressure. Will try to reduce and then comes back out. Makes her feel like she has to have a BM. No straining. No constipation. Toilet time limited. No rectal bleeding. No itching or burning. No rectal pain. Wears pads. Chronic issue.   Past Medical History:  Diagnosis Date  . Arthritis    reports her joints are fine and she has no pain   . Ascending aortic aneurysm ([place]) 07/20/2014   4.1 cm by CT  . Atrial fib/flutter, transient    post-op  . CHF (congestive heart failure) ([place])    a. EF 30-35% by echo in 2013 --> similar results by repeat imaging in 2016 and 11/2017.  Marland Kitchen Constipation    corrects with diet  . Floater, vitreous    Right  . Hypertension   . 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 Pneumonia   . Psoriasis    Ankles  . Tobacco abuse     Past Surgical History:  Procedure Laterality Date  . CATARACT EXTRACTION Bilateral 2016  . COLONOSCOPY  01/22/2012   Procedure: COLONOSCOPY;  Surgeon: Danie Binder, MD;  [facility];  Service: Endoscopy;  Laterality: N/A;  10:45  . COLONOSCOPY N/A 04/15/2017   Four 4-6 mm polyps in descending colon, ascending, and cecum. One 15 mm polyp in proximal ascending colon, one 4 mm poylp at hepatic flexure, two 3-4 mm polyps in sigmoid and descending. Internal hemorrhoids. Colonoscopy in 3-5 years if benefits outweigh risks. Simple adenomas.   . IR RADIOLOGIST EVAL & MGMT  03/04/2018  . POLYPECTOMY  04/15/2017   Procedure: POLYPECTOMY;  Surgeon: Danie Binder, MD;  Location: [facility];  Service: Endoscopy;;  ascending colon  . RADIOLOGY WITH ANESTHESIA N/A 03/19/2018   Procedure: CT MICROWAVE THERMAL ABLATION WITH ANESTHESIA;  Surgeon: Jacqulynn Cadet, MD;  Location: [facility];  Service: Anesthesiology;  Laterality: N/A;  . right VATS (upper lobectomy)  08/29/11   Dr Roxan Hockey  . VIDEO BRONCHOSCOPY WITH ENDOBRONCHIAL NAVIGATION N/A 03/18/2015   Procedure: VIDEO BRONCHOSCOPY WITH ENDOBRONCHIAL NAVIGATION;  Surgeon: [name], MD;  Location: [facility];  Service: Thoracic;  Laterality: N/A;    Current Outpatient Medications  Medication Sig Dispense Refill  . alendronate (FOSAMAX) 70 MG tablet Take 70 mg by mouth every Monday.   3  . calcium-vitamin D (OSCAL WITH D) 500-200 MG-UNIT tablet Take 1 tablet by mouth 3 (three) times daily.    Marland Kitchen glucosamine-chondroitin 500-400 MG tablet Take 1 tablet by mouth daily.    . hydrochlorothiazide (MICROZIDE) 12.5 MG capsule Take 12.5 mg by mouth daily at 2 PM.   3  . meclizine (ANTIVERT) 12.5 MG tablet Take 12.5 mg by mouth daily at 12 noon.     . metoprolol succinate (TOPROL XL) 25 MG 24 hr tablet Take 1 tablet (25 mg total) by mouth 2 (two) times daily. 60 tablet 6  . Multiple Vitamins-Iron (MULTIVITAMINS WITH IRON) TABS Take  1 tablet by mouth daily.    . Omega-3 Fatty Acids (FISH OIL) 500 MG CAPS Take 500 mg by mouth daily.     . vitamin B-12 (CYANOCOBALAMIN) 500 MCG tablet Take 500 mcg by mouth daily.     No current facility-administered medications for this visit.    Allergies as of 11/06/2019 - Review Complete 11/06/2019  Allergen Reaction Noted  . Ibuprofen Nausea And Vomiting 01/07/2012  . Powder Rash 03/20/2017    Family History  Problem Relation Age of Onset  . Hypertension Mother   . Heart disease Mother   . Heart disease Sister   . Heart disease Brother   . Colon cancer Neg Hx     Social History   Socioeconomic History  .  Marital status: Widowed    Spouse name: Not on file  . Number of children: 3  . Years of education: Not on file  . Highest education level: Not on file  Occupational History  . Occupation: retired  Tobacco Use  . Smoking status: Current Some Day Smoker    Packs/day: 0.30    Years: 20.00    Pack years: 6.00    Types: Cigarettes    Last attempt to quit: 01/12/2014    Years since quitting: 5.8  . Smokeless tobacco: Never Used  . Tobacco comment: only when nerves bother her   Substance and Sexual Activity  . Alcohol use: No  . Drug use: No  . Sexual activity: Not on file  Other Topics Concern  . Not on file  Social History Narrative   Lives w/ cousin   Social Determinants of Health   Financial Resource Strain:   . Difficulty of Paying Living Expenses:   Food Insecurity:   . Worried About Charity fundraiser in the Last Year:   . Arboriculturist in the Last Year:   Transportation Needs:   . Film/video editor (Medical):   Marland Kitchen Lack of Transportation (Non-Medical):   Physical Activity:   . Days of Exercise per Week:   . Minutes of Exercise per Session:   Stress:   . Feeling of Stress :   Social Connections:   . Frequency of Communication with Friends and Family:   . Frequency of Social Gatherings with Friends and Family:   . Attends Religious Services:   . Active Member of Clubs or Organizations:   . Attends Archivist Meetings:   Marland Kitchen Marital Status:     Review of Systems: Gen: Denies fever, chills, anorexia. Denies fatigue, weakness, weight loss.  CV: Denies chest pain, palpitations, syncope, peripheral edema, and claudication. Resp: Denies dyspnea at rest, cough, wheezing, coughing up blood, and pleurisy. GI: see HPI Derm: Denies rash, itching, dry skin Psych: Denies depression, anxiety, memory loss, confusion. No homicidal or suicidal ideation.  Heme: Denies bruising, bleeding, and enlarged lymph nodes.  Physical Exam: BP 129/80   Pulse 67   Temp (!)  97.1 F (36.2 C) (Oral)   Ht 5\' 7"  (1.702 m)   Wt 167 lb 3.2 oz (75.8 kg)   BMI 26.19 kg/m  General:   Alert and oriented. No distress noted. Pleasant and cooperative.  Head:  Normocephalic and atraumatic. Eyes:  Conjuctiva clear without scleral icterus. Mouth:  Mask in place Abdomen:  +BS, soft, non-tender and non-distended. No rebound or guarding. No HSM or masses noted. Rectal: Grade 3 prolapsing hemorrhoids, easy to reduce, no obvious fissure, no mass on exam. Msk:  Symmetrical without gross deformities.  Normal posture. Extremities:  Without edema. Neurologic:  Alert and  oriented x4 Psych:  Alert and cooperative. Normal mood and affect.  ASSESSMENT: BENICIA BERGEVIN is a 79 y.o. female presenting today with symptomatic Grade 3 hemorrhoids, denying constipation, rectal bleeding, or pain. She desires to have hemorrhoid banding performed as outpatient. Colonoscopy in 2018 with multiple adenomas. Surveillance 2021-2023 if health permits. As she has no alarm signs/symptoms and no rectal bleeding, will continue to follow for now. She is unable to complete banding today but desires appointment in future.    PLAN:   Limit toilet time   Anusol per rectum BID  Lidocaine per rectum prn  Avoid straining  3-4 weeks follow-up to reassess and likely banding   Annitta Needs, PhD, ANP-BC [facility]

## 2019-11-12 DIAGNOSIS — I10 Essential (primary) hypertension: Secondary | ICD-10-CM | POA: Diagnosis not present

## 2019-11-12 DIAGNOSIS — I5022 Chronic systolic (congestive) heart failure: Secondary | ICD-10-CM | POA: Diagnosis not present

## 2019-11-30 DIAGNOSIS — Z1231 Encounter for screening mammogram for malignant neoplasm of breast: Secondary | ICD-10-CM | POA: Diagnosis not present

## 2019-12-01 ENCOUNTER — Other Ambulatory Visit: Payer: Self-pay

## 2019-12-01 ENCOUNTER — Ambulatory Visit (INDEPENDENT_AMBULATORY_CARE_PROVIDER_SITE_OTHER): Payer: Medicare Other | Admitting: Gastroenterology

## 2019-12-01 ENCOUNTER — Encounter: Payer: Self-pay | Admitting: Gastroenterology

## 2019-12-01 VITALS — BP 124/76 | HR 73 | Temp 97.0°F | Ht 67.0 in | Wt 163.4 lb

## 2019-12-01 DIAGNOSIS — K642 Third degree hemorrhoids: Secondary | ICD-10-CM | POA: Diagnosis not present

## 2019-12-01 NOTE — Patient Instructions (Signed)
Continue to limit time on the toilet as you are doing.  You may add Benefiber 2 teaspoons each morning to coffee or other liquid. This will help promote good bowel habits and avoid constipation.  I will see you in 2 weeks for another banding!  I enjoyed seeing you again today! As you know, I value our relationship and want to provide genuine, compassionate, and quality care. I welcome your feedback. If you receive a survey regarding your visit,  I greatly appreciate you taking time to fill this out. See you next time!  Annitta Needs, PhD, ANP-BC [facility]astroenter[facility]

## 2019-12-01 NOTE — Progress Notes (Signed)
[facility] Banding Note:   79 year old female with history of multiple adenomas in 2018, surveillance in 3-5 years if benefits outweigh the risks, chronic constipation, with symptomatic prolapsing hemorrhoids. Grade 3 prolapsing hemorrhoids on recent exam in office. Here to pursue banding. No rectal bleeding. Notes protrusion of tissue and soiling.   The patient presents with symptomatic grade 3 hemorrhoids, unresponsive to maximal medical therapy, requesting rubber band ligation of her hemorrhoidal disease. All risks, benefits, and alternative forms of therapy were described and informed consent was obtained. Due to possible latex allergy, the latex-free kit was used.   The decision was made to band the right posterior  internal hemorrhoid, and the [place] was used to perform band ligation without complication. Digital anorectal examination was then performed to assure proper positioning of the band, and to adjust the banded tissue as required. The patient was discharged home without pain or other issues. Dietary and behavioral recommendations were given, along with follow-up instructions. The patient will return in 2 weeks for followup and possible additional banding as required.  No complications were encountered and the patient tolerated the procedure well.  Annitta Needs, PhD, ANP-BC [facility]

## 2019-12-13 DIAGNOSIS — I5022 Chronic systolic (congestive) heart failure: Secondary | ICD-10-CM | POA: Diagnosis not present

## 2019-12-13 DIAGNOSIS — I10 Essential (primary) hypertension: Secondary | ICD-10-CM | POA: Diagnosis not present

## 2020-01-08 ENCOUNTER — Other Ambulatory Visit: Payer: Self-pay

## 2020-01-08 ENCOUNTER — Encounter: Payer: Self-pay | Admitting: Gastroenterology

## 2020-01-08 ENCOUNTER — Ambulatory Visit (INDEPENDENT_AMBULATORY_CARE_PROVIDER_SITE_OTHER): Payer: Medicare Other | Admitting: Gastroenterology

## 2020-01-08 VITALS — BP 124/83 | HR 64 | Temp 97.0°F | Ht 67.0 in | Wt 162.4 lb

## 2020-01-08 DIAGNOSIS — K642 Third degree hemorrhoids: Secondary | ICD-10-CM

## 2020-01-08 NOTE — Patient Instructions (Signed)
I am going to try and get you in on July 15th at 11am. We will have to open a slot for this.  Continue to avoid straining, limit toilet time, and avoid constipation.  We will see you soon in a few weeks!   I enjoyed seeing you again today! As you know, I value our relationship and want to provide genuine, compassionate, and quality care. I welcome your feedback. If you receive a survey regarding your visit,  I greatly appreciate you taking time to fill this out. See you next time!  Annitta Needs, PhD, ANP-BC [facility]astroenterology

## 2020-01-08 NOTE — Progress Notes (Signed)
[facility] Banding Note:    Heather [name] presents today with history of multiple adenomas in 2018, surveillance in 3-5 years if benefits outweigh the risks, chronic constipation, with symptomatic prolapsing hemorrhoids. Grade 3 prolapsing hemorrhoids on recent exam in office. First banding in May 2021 of right posterior. Here for further banding as appropriate. Noted mild improvement with first banding. Still with prolapsing. No rectal bleeding. No pain. Denies constipation or diarrhea.   The patient presents with symptomatic grade 3 hemorrhoids, unresponsive to maximal medical therapy, requesting rubber band ligation of her hemorrhoidal disease. All risks, benefits, and alternative forms of therapy were described and informed consent was obtained.  The decision was made to band the left lateral internal hemorrhoid, and the [place] was used to perform band ligation without complication. Digital anorectal examination was then performed to assure proper positioning of the band, and to adjust the banded tissue as required. The patient was discharged home without pain or other issues. Dietary and behavioral recommendations were given, along with follow-up instructions. The patient will return in 2 weeks for followup and possible additional banding as required.  No complications were encountered and the patient tolerated the procedure well. Due to possible latex-allergy, we used latex-free bands.   Heather Needs, PhD, ANP-BC [facility]astro[facility]

## 2020-01-12 ENCOUNTER — Telehealth: Payer: Self-pay | Admitting: Gastroenterology

## 2020-01-12 DIAGNOSIS — J449 Chronic obstructive pulmonary disease, unspecified: Secondary | ICD-10-CM | POA: Diagnosis not present

## 2020-01-12 DIAGNOSIS — I5022 Chronic systolic (congestive) heart failure: Secondary | ICD-10-CM | POA: Diagnosis not present

## 2020-01-12 NOTE — Telephone Encounter (Signed)
PER ANNA, PLEASE ADD A SLOT FOR 06/15 AT 11AM FOR A BANDING FOR PATIENT.

## 2020-01-13 NOTE — Telephone Encounter (Signed)
7/15? 

## 2020-01-13 NOTE — Telephone Encounter (Signed)
Yes please

## 2020-01-13 NOTE — Telephone Encounter (Signed)
done

## 2020-01-14 NOTE — Telephone Encounter (Signed)
Patient called and scheduled.

## 2020-01-21 ENCOUNTER — Ambulatory Visit (INDEPENDENT_AMBULATORY_CARE_PROVIDER_SITE_OTHER): Payer: Medicare Other | Admitting: Gastroenterology

## 2020-01-21 ENCOUNTER — Other Ambulatory Visit: Payer: Self-pay

## 2020-01-21 ENCOUNTER — Encounter: Payer: Self-pay | Admitting: Gastroenterology

## 2020-01-21 VITALS — BP 127/82 | HR 68 | Temp 98.7°F | Ht 67.0 in | Wt 158.4 lb

## 2020-01-21 DIAGNOSIS — K642 Third degree hemorrhoids: Secondary | ICD-10-CM

## 2020-01-21 NOTE — Patient Instructions (Signed)
I recommend taking Benefiber 2 teaspoons each morning, and you can increase to three times a day if needed.  I will see you back around second week of August for further banding if needed!  I enjoyed seeing you again today! As you know, I value our relationship and want to provide genuine, compassionate, and quality care. I welcome your feedback. If you receive a survey regarding your visit,  I greatly appreciate you taking time to fill this out. See you next time!  Annitta Needs, PhD, ANP-BC [facility]astroenter[facility]

## 2020-01-21 NOTE — Progress Notes (Signed)
[facility] Banding Note:   Ms. Maffia presents today with history of multiple adenomas in 2018, surveillance in 3-5 years if benefits outweigh the risks, chronic constipation, with symptomatic prolapsing hemorrhoids. First banding May 2021 of right posterior. Left lateral at last appt several weeks ago. Some improvement but still with prolapsing. Some constipation at times, but coffee helps. Continues to strain a small amount.   The patient presents with symptomatic grade 3 hemorrhoids, unresponsive to maximal medical therapy, requesting rubber band ligation of his/her hemorrhoidal disease. All risks, benefits, and alternative forms of therapy were described and informed consent was obtained.  The decision was made to band neutrally, and the [place] was used to perform band ligation without complication. Digital anorectal examination was then performed to assure proper positioning of the band, and to adjust the banded tissue as required. The patient was discharged home without pain or other issues. Dietary and behavioral recommendations were given and (if necessary prescriptions were given), along with follow-up instructions. The patient will return in 2-3 weeks  for followup and possible additional banding as required. I anticipate she may need one additional banding.   No complications were encountered and the patient tolerated the procedure well  Annitta Needs, PhD, [facility]astroenter[facility]

## 2020-02-12 DIAGNOSIS — M1990 Unspecified osteoarthritis, unspecified site: Secondary | ICD-10-CM | POA: Diagnosis not present

## 2020-02-12 DIAGNOSIS — I5022 Chronic systolic (congestive) heart failure: Secondary | ICD-10-CM | POA: Diagnosis not present

## 2020-03-17 ENCOUNTER — Inpatient Hospital Stay: Payer: Medicare Other | Attending: Internal Medicine

## 2020-03-17 ENCOUNTER — Other Ambulatory Visit: Payer: Self-pay

## 2020-03-17 ENCOUNTER — Ambulatory Visit (HOSPITAL_COMMUNITY)
Admission: RE | Admit: 2020-03-17 | Discharge: 2020-03-17 | Disposition: A | Discharge status: Home / Self Care | Payer: Medicare Other | Source: Ambulatory Visit | Attending: Internal Medicine | Admitting: Internal Medicine

## 2020-03-17 ENCOUNTER — Other Ambulatory Visit: Payer: Medicare Other

## 2020-03-17 DIAGNOSIS — Z86010 Personal history of colonic polyps: Secondary | ICD-10-CM | POA: Diagnosis not present

## 2020-03-17 DIAGNOSIS — C3490 Malignant neoplasm of unspecified part of unspecified bronchus or lung: Secondary | ICD-10-CM

## 2020-03-17 DIAGNOSIS — Z886 Allergy status to analgesic agent status: Secondary | ICD-10-CM | POA: Insufficient documentation

## 2020-03-17 DIAGNOSIS — Z79899 Other long term (current) drug therapy: Secondary | ICD-10-CM | POA: Diagnosis not present

## 2020-03-17 DIAGNOSIS — I2510 Atherosclerotic heart disease of native coronary artery without angina pectoris: Secondary | ICD-10-CM | POA: Diagnosis not present

## 2020-03-17 DIAGNOSIS — C3412 Malignant neoplasm of upper lobe, left bronchus or lung: Secondary | ICD-10-CM | POA: Diagnosis not present

## 2020-03-17 DIAGNOSIS — Z8719 Personal history of other diseases of the digestive system: Secondary | ICD-10-CM | POA: Insufficient documentation

## 2020-03-17 DIAGNOSIS — I700 Atherosclerosis of aorta: Secondary | ICD-10-CM | POA: Diagnosis not present

## 2020-03-17 DIAGNOSIS — J984 Other disorders of lung: Secondary | ICD-10-CM | POA: Diagnosis not present

## 2020-03-17 LAB — CMP (CANCER CENTER ONLY)
ALT: 10 U/L (ref 0–44)
AST: 22 U/L (ref 15–41)
Albumin: 3.7 g/dL (ref 3.5–5.0)
Alkaline Phosphatase: 53 U/L (ref 38–126)
Anion gap: 4 — ABNORMAL LOW (ref 5–15)
BUN: 9 mg/dL (ref 8–23)
CO2: 30 mmol/L (ref 22–32)
Calcium: 9.3 mg/dL (ref 8.9–10.3)
Chloride: 109 mmol/L (ref 98–111)
Creatinine: 0.91 mg/dL (ref 0.44–1.00)
GFR, Est AFR Am: >60 mL/min (ref >60)
GFR, Estimated: 60 mL/min — ABNORMAL LOW (ref >60)
Glucose, Bld: 90 mg/dL (ref 70–99)
Potassium: 3.8 mmol/L (ref 3.5–5.1)
Sodium: 143 mmol/L (ref 135–145)
Total Bilirubin: 1 mg/dL (ref 0.3–1.2)
Total Protein: 6.6 g/dL (ref 6.5–8.1)

## 2020-03-17 LAB — CBC WITH DIFFERENTIAL (CANCER CENTER ONLY)
Abs Immature Granulocytes: 0.01 10*3/uL (ref 0.00–0.07)
Basophils Absolute: 0 10*3/uL (ref 0.0–0.1)
Basophils Relative: 0 %
Eosinophils Absolute: 0.1 10*3/uL (ref 0.0–0.5)
Eosinophils Relative: 2 %
HCT: 35.5 % — ABNORMAL LOW (ref 36.0–46.0)
Hemoglobin: 11.9 g/dL — ABNORMAL LOW (ref 12.0–15.0)
Immature Granulocytes: 0 %
Lymphocytes Relative: 52 %
Lymphs Abs: 3.5 10*3/uL (ref 0.7–4.0)
MCH: 31.2 pg (ref 26.0–34.0)
MCHC: 33.5 g/dL (ref 30.0–36.0)
MCV: 92.9 fL (ref 80.0–100.0)
Monocytes Absolute: 0.4 10*3/uL (ref 0.1–1.0)
Monocytes Relative: 6 %
Neutro Abs: 2.7 10*3/uL (ref 1.7–7.7)
Neutrophils Relative %: 40 %
Platelet Count: 163 10*3/uL (ref 150–400)
RBC: 3.82 MIL/uL — ABNORMAL LOW (ref 3.87–5.11)
RDW: 14.3 % (ref 11.5–15.5)
WBC Count: 6.8 10*3/uL (ref 4.0–10.5)
nRBC: 0 % (ref 0.0–0.2)

## 2020-03-17 MED ORDER — IOHEXOL 300 MG/ML  SOLN
75.0000 mL | Freq: Once | INTRAMUSCULAR | Status: AC | PRN
  Administered 2020-03-17: 75 mL via INTRAVENOUS

## 2020-03-24 ENCOUNTER — Inpatient Hospital Stay: Payer: Medicare Other | Admitting: Internal Medicine

## 2020-03-24 ENCOUNTER — Telehealth: Payer: Self-pay | Admitting: Internal Medicine

## 2020-03-24 NOTE — Telephone Encounter (Signed)
R/s 9/16 appt per transportation issues. Called and spoke with patient. Confirmed appt

## 2020-03-25 ENCOUNTER — Encounter: Payer: Self-pay | Admitting: Gastroenterology

## 2020-03-25 ENCOUNTER — Other Ambulatory Visit: Payer: Self-pay

## 2020-03-25 ENCOUNTER — Ambulatory Visit (INDEPENDENT_AMBULATORY_CARE_PROVIDER_SITE_OTHER): Payer: Medicare Other | Admitting: Gastroenterology

## 2020-03-25 DIAGNOSIS — K642 Third degree hemorrhoids: Secondary | ICD-10-CM | POA: Diagnosis not present

## 2020-03-25 NOTE — Patient Instructions (Signed)
You have external tissue, "hemorrhoid tags" that you will feel from time to time and can't be banded. However, I banded again today on the inside, so we will see if that helps your overall symptoms!  We will see you back in 4-6 weeks!  I enjoyed seeing you again today! As you know, I value our relationship and want to provide genuine, compassionate, and quality care. I welcome your feedback. If you receive a survey regarding your visit,  I greatly appreciate you taking time to fill this out. See you next time!  Annitta Needs, PhD, ANP-BC [facility] Gastroenterology

## 2020-03-25 NOTE — Progress Notes (Signed)
[facility] Banding Note:   Pleasant 79 year old female with history of multiple adenomas in 2018, surveillance in 2-5 years if benefits outweigh the risks, chroni constipation, symptomatic prolapsing hemorrhoids. Thus far, she has had banding of right posterior, left lateral, and then neutrally in July 2021. She returns today. I had discussed with her that she may need additional banding due to significant hemorrhoids. States she still has prolapsing tissue. She is not interested in a colonoscopy again unless absolutely necessary. She has no bleeding, pain, or alarm signs/symptoms.   In the left lateral decubitus position anoscopic examination revealed grade 3 hemorrhoids in the left lateral position. She has hemorrhoid tags in the left lateral position, which may be culprit for some of her symptoms.   The decision was made to band the left lateral internal hemorrhoid, and the [place] was used to perform band ligation without complication. Digital anorectal examination was then performed to assure proper positioning of the band, and to adjust the banded tissue as required. The patient was discharged home without pain or other issues. Dietary and behavioral recommendations were given, along with follow-up instructions. The patient will return in 4-6 weeksfor followup and possible additional banding as required. I did discuss with her that she has external hemorrhoid tags that are not able to be banded. This could be causing some of her symptoms of feeling fullness and prolapsing. However, I was able to band a moderate sized grade 3 left lateral internal hemorrhoid today.   No complications were encountered and the patient tolerated the procedure well.  Annitta Needs, PhD, ANP-BC [facility] Gastroenterology

## 2020-04-05 DIAGNOSIS — I5022 Chronic systolic (congestive) heart failure: Secondary | ICD-10-CM | POA: Diagnosis not present

## 2020-04-05 DIAGNOSIS — Z23 Encounter for immunization: Secondary | ICD-10-CM | POA: Diagnosis not present

## 2020-04-05 DIAGNOSIS — M810 Age-related osteoporosis without current pathological fracture: Secondary | ICD-10-CM | POA: Diagnosis not present

## 2020-04-05 DIAGNOSIS — I10 Essential (primary) hypertension: Secondary | ICD-10-CM | POA: Diagnosis not present

## 2020-04-06 ENCOUNTER — Inpatient Hospital Stay (HOSPITAL_BASED_OUTPATIENT_CLINIC_OR_DEPARTMENT_OTHER): Payer: Medicare Other | Admitting: Internal Medicine

## 2020-04-06 ENCOUNTER — Telehealth: Payer: Self-pay | Admitting: Internal Medicine

## 2020-04-06 ENCOUNTER — Encounter: Payer: Self-pay | Admitting: Internal Medicine

## 2020-04-06 ENCOUNTER — Other Ambulatory Visit: Payer: Self-pay

## 2020-04-06 VITALS — BP 162/91 | HR 71 | Temp 97.1°F | Resp 16 | Ht 67.0 in | Wt 156.0 lb

## 2020-04-06 DIAGNOSIS — C3412 Malignant neoplasm of upper lobe, left bronchus or lung: Secondary | ICD-10-CM

## 2020-04-06 DIAGNOSIS — Z79899 Other long term (current) drug therapy: Secondary | ICD-10-CM | POA: Diagnosis not present

## 2020-04-06 DIAGNOSIS — C3490 Malignant neoplasm of unspecified part of unspecified bronchus or lung: Secondary | ICD-10-CM | POA: Diagnosis not present

## 2020-04-06 DIAGNOSIS — Z8719 Personal history of other diseases of the digestive system: Secondary | ICD-10-CM | POA: Diagnosis not present

## 2020-04-06 DIAGNOSIS — Z86010 Personal history of colonic polyps: Secondary | ICD-10-CM | POA: Diagnosis not present

## 2020-04-06 DIAGNOSIS — Z886 Allergy status to analgesic agent status: Secondary | ICD-10-CM | POA: Diagnosis not present

## 2020-04-06 DIAGNOSIS — I2510 Atherosclerotic heart disease of native coronary artery without angina pectoris: Secondary | ICD-10-CM | POA: Diagnosis not present

## 2020-04-06 DIAGNOSIS — I700 Atherosclerosis of aorta: Secondary | ICD-10-CM | POA: Diagnosis not present

## 2020-04-06 NOTE — [place] Notes (Signed)
[facility] [phone]   Fax:([phone] [place], [place], [place] [address] [place] [address]  DIAGNOSIS:  1) Stage IA non-small cell lung cancer diagnosed in March 2017.  2) She now has recurrent disease in the left upper lobe suspicious for stage IA.  She also has some other small pulmonary nodules that could be low-grade adenocarcinoma.  PRIOR THERAPY:  1) status post right upper lobectomy with lymph node dissection on September 24, 2011 2) status post stereotactic radiotherapy to the left upper lobe lung nodule in February 2019.   CURRENT THERAPY: Observation.  INTERVAL HISTORY: Heather [name] 79 y.o. female returns to the clinic today for 6 months follow-up visit.  The patient is feeling fine today with no concerning complaints.  She denied having any chest pain, shortness of breath, cough or hemoptysis.  She denied having any fever or chills.  She has no nausea, vomiting, diarrhea or constipation.  She has no headache or visual changes.  The patient had repeat CT scan of the chest performed recently and she is here for evaluation and discussion of her risk her results.  MEDICAL HISTORY: Past Medical History:  Diagnosis Date  . Arthritis    reports her joints are fine and she has no pain   . Ascending aortic aneurysm ([facility]) 07/20/2014   4.1 cm by CT  . Atrial fib/flutter, transient    post-op  . CHF (congestive heart failure) ([facility])    a. EF 30-35% by echo in 2013 --> similar results by repeat imaging in 2016 and 11/2017.  Marland Kitchen Constipation    corrects with diet  . Floater, vitreous    Right  . Hypertension   . Nonsquamous nonsmall cell neoplasm of lung ([facility]) 09/2011   Stage IB- s/p right upper lobectomy  . Pneumonia   . Psoriasis    Ankles  . Tobacco abuse     ALLERGIES:  is allergic to ibuprofen and powder.  MEDICATIONS:  Current Outpatient Medications  Medication Sig Dispense Refill  . alendronate  (FOSAMAX) 70 MG tablet Take 70 mg by mouth every Monday.   3  . calcium-vitamin D (OSCAL WITH D) 500-200 MG-UNIT tablet Take 1 tablet by mouth 3 (three) times daily.    Marland Kitchen glucosamine-chondroitin 500-400 MG tablet Take 1 tablet by mouth daily.    . hydrochlorothiazide (MICROZIDE) 12.5 MG capsule Take 12.5 mg by mouth daily at 2 PM.   3  . hydrocortisone (ANUSOL-HC) 2.5 % rectal cream Place 1 application rectally 2 (two) times daily. 30 g 1  . meclizine (ANTIVERT) 12.5 MG tablet Take 12.5 mg by mouth daily at 12 noon.     . metoprolol succinate (TOPROL XL) 25 MG 24 hr tablet Take 1 tablet (25 mg total) by mouth 2 (two) times daily. 60 tablet 6  . Multiple Vitamins-Iron (MULTIVITAMINS WITH IRON) TABS Take 1 tablet by mouth daily.    . Omega-3 Fatty Acids (FISH OIL) 500 MG CAPS Take 500 mg by mouth daily.     . vitamin B-12 (CYANOCOBALAMIN) 500 MCG tablet Take 500 mcg by mouth daily.     No current facility-administered medications for this visit.    SURGICAL HISTORY:  Past Surgical History:  Procedure Laterality Date  . CATARACT EXTRACTION Bilateral 2016  . COLONOSCOPY  01/22/2012   Procedure: COLONOSCOPY;  Surgeon: Danie Binder, MD;  [facility];  Service: Endoscopy;  Laterality: N/A;  10:45  .  COLONOSCOPY N/A 04/15/2017   Four 4-6 mm polyps in descending colon, ascending, and cecum. One 15 mm polyp in proximal ascending colon, one 4 mm poylp at hepatic flexure, two 3-4 mm polyps in sigmoid and descending. Internal hemorrhoids. Colonoscopy in 3-5 years if benefits outweigh risks. Simple adenomas.   . IR RADIOLOGIST EVAL & MGMT  03/04/2018  . POLYPECTOMY  04/15/2017   Procedure: POLYPECTOMY;  Surgeon: Danie Binder, MD;  [facility];  Service: Endoscopy;;  ascending colon  . RADIOLOGY WITH ANESTHESIA N/A 03/19/2018   Procedure: CT MICROWAVE THERMAL ABLATION WITH ANESTHESIA;  Surgeon: Jacqulynn Cadet, MD;  Location: [facility];  Service: Anesthesiology;  Laterality: N/A;   . right VATS (upper lobectomy)  08/29/11   Dr Roxan Hockey  . VIDEO BRONCHOSCOPY WITH ENDOBRONCHIAL NAVIGATION N/A 03/18/2015   Procedure: VIDEO BRONCHOSCOPY WITH ENDOBRONCHIAL NAVIGATION;  Surgeon: [name], MD;  Location: [facility];  Service: Thoracic;  Laterality: N/A;    REVIEW OF SYSTEMS:  A comprehensive review of systems was negative.   PHYSICAL EXAMINATION: General appearance: alert, cooperative and no distress Head: Normocephalic, without obvious abnormality, atraumatic Neck: no adenopathy, no JVD, supple, symmetrical, trachea midline and thyroid not enlarged, symmetric, no tenderness/mass/nodules Lymph nodes: Cervical, supraclavicular, and axillary nodes normal. Resp: clear to auscultation bilaterally Back: symmetric, no curvature. ROM normal. No CVA tenderness. Cardio: regular rate and rhythm, S1, S2 normal, no murmur, click, rub or gallop GI: soft, non-tender; bowel sounds normal; no masses,  no organomegaly Extremities: extremities normal, atraumatic, no cyanosis or edema  ECOG PERFORMANCE STATUS: 1 - Symptomatic but completely ambulatory  Blood pressure (!) 162/91, pulse 71, temperature (!) 97.1 F (36.2 C), resp. rate 16, height 5\' 7"  (1.702 m), weight 156 lb (70.8 kg), SpO2 99 %.  LABORATORY DATA: Lab Results  Component Value Date   WBC 6.8 03/17/2020   HGB 11.9 (L) 03/17/2020   HCT 35.5 (L) 03/17/2020   MCV 92.9 03/17/2020   PLT 163 03/17/2020      Chemistry      Component Value Date/Time   NA 143 03/17/2020 1102   K 3.8 03/17/2020 1102   CL 109 03/17/2020 1102   CO2 30 03/17/2020 1102   BUN 9 03/17/2020 1102   CREATININE 0.91 03/17/2020 1102      Component Value Date/Time   CALCIUM 9.3 03/17/2020 1102   ALKPHOS 53 03/17/2020 1102   AST 22 03/17/2020 1102   ALT 10 03/17/2020 1102   BILITOT 1.0 03/17/2020 1102       RADIOGRAPHIC STUDIES: CT Chest W Contrast  Result Date: 03/17/2020 CLINICAL DATA:  Non-small cell lung cancer staging EXAM: CT  CHEST WITH CONTRAST TECHNIQUE: Multidetector CT imaging of the chest was performed during intravenous contrast administration. CONTRAST:  39mL OMNIPAQUE IOHEXOL 300 MG/ML  SOLN COMPARISON:  09/14/2019, 03/19/2019 FINDINGS: Cardiovascular: Aortic atherosclerosis. Cardiomegaly. Left coronary artery calcifications. No pericardial effusion. Mediastinum/Nodes: No enlarged mediastinal, hilar, or axillary lymph nodes. Thyroid gland, trachea, and esophagus demonstrate no significant findings. Lungs/Pleura: Redemonstrated postoperative findings of right upper lobectomy. Unchanged, predominantly bandlike appearance of treated nodule of the anterior paramedian left upper lobe, measuring 3.1 x 1.8 cm (series 5, image 50). There are numerous bilateral ground-glass pulmonary opacities, some of which with a sub solid appearance, in particular the apical left upper lobe measuring 1.3 x 1.3 cm (series 5, image 19) and in the superior segment right lower lobe measuring 1.8 x 1.2 cm (series 5, image 40). No pleural effusion or pneumothorax. Upper Abdomen:  No acute abnormality. Musculoskeletal: No chest wall mass or suspicious bone lesions identified. IMPRESSION: 1. Redemonstrated postoperative findings of right upper lobectomy. 2. Unchanged, predominantly bandlike appearance of treated nodule of the anterior paramedian left upper lobe. 3. Numerous bilateral ground-glass pulmonary opacities, some of which with a subsolid appearance, unchanged compared to prior examination. While nonspecific and possibly infectious or inflammatory, multifocal indolent adenocarcinoma remains a differential concern. Continued attention on follow-up. 4. Coronary artery disease.  Aortic Atherosclerosis (ICD10-I70.0). Electronically Signed   By: Eddie Candle M.D.   On: 03/17/2020 15:00    ASSESSMENT AND PLAN: This is a very pleasant 79 years old African-American female with recurrent non-small cell lung cancer presented with right upper lobe lung nodule  that was previously treated with a stereotactic radiotherapy. The patient is doing fine today with no concerning complaints. She had repeat CT scan of the chest performed recently.  I personally and independently reviewed the scan images and discussed the result and showed the images to the patient today. Her scan showed a stable disease but she continues to have the numerous bilateral groundglass pulmonary opacities some of which with subsolid appearance but unchanged from the previous exam and concerning for inflammatory versus multifocal indolent adenocarcinoma. I recommended for the patient to continue on observation with repeat CT scan of the chest in 6 months. She was advised to call immediately if she has any concerning symptoms in the interval. The patient voices understanding of current disease status and treatment options and is in agreement with the current care plan. All questions were answered. The patient knows to call the clinic with any problems, questions or concerns. We can certainly see the patient much sooner if necessary.   Disclaimer: This note was dictated with voice recognition software. Similar sounding words can inadvertently be transcribed and may not be corrected upon review.

## 2020-04-06 NOTE — Telephone Encounter (Signed)
Scheduled per 9/29 los. Printed avs and calendar for pt.

## 2020-04-26 ENCOUNTER — Ambulatory Visit: Payer: Medicare Other | Admitting: Cardiovascular Disease

## 2020-04-26 DIAGNOSIS — M542 Cervicalgia: Secondary | ICD-10-CM | POA: Diagnosis not present

## 2020-04-26 DIAGNOSIS — Z87891 Personal history of nicotine dependence: Secondary | ICD-10-CM | POA: Diagnosis not present

## 2020-04-26 DIAGNOSIS — M47812 Spondylosis without myelopathy or radiculopathy, cervical region: Secondary | ICD-10-CM | POA: Diagnosis not present

## 2020-04-27 ENCOUNTER — Encounter: Payer: Self-pay | Admitting: Cardiology

## 2020-04-27 ENCOUNTER — Encounter: Payer: Self-pay | Admitting: *Deleted

## 2020-04-27 ENCOUNTER — Ambulatory Visit (INDEPENDENT_AMBULATORY_CARE_PROVIDER_SITE_OTHER): Payer: Medicare Other | Admitting: Cardiology

## 2020-04-27 VITALS — BP 134/78 | HR 89 | Ht 67.0 in | Wt 152.8 lb

## 2020-04-27 DIAGNOSIS — I5022 Chronic systolic (congestive) heart failure: Secondary | ICD-10-CM

## 2020-04-27 DIAGNOSIS — I10 Essential (primary) hypertension: Secondary | ICD-10-CM | POA: Diagnosis not present

## 2020-04-27 NOTE — [place] Notes (Signed)
Clinical Summary Heather [name] is a 79 y.o.female seen today for follow up of the following medical problems  1. Chronic combined systolic/diastolic HF - LVEF 30 to 94% in May 2019.  Nuclear stress test on 09/22/2018 was normal with a normal calculated LVEF of 60% suggesting a nonischemic etiology  04/2019 echo LVEF 45-50%. Apical hypokinesis. Grade I DDx.  - at time of repeat echo entresto was stopped by Dr Bronson Ing  - no SOB/DOE. No LE edema - compliant with meds  2. Afib - from prior cardiology notes remote history, has not been on anticoag - no recent palpitaitons.    3. Mitral regurgitation 04/2019 echo mild MR  4. HTN - has not taken meds yet today   Has had covid vaccine, moderna. Getting 3rd shot tomorrow.     Past Medical History:  Diagnosis Date  . Arthritis    reports her joints are fine and she has no pain   . Ascending aortic aneurysm ([facility]) 07/20/2014   4.1 cm by CT  . Atrial fib/flutter, transient    post-op  . CHF (congestive heart failure) ([facility])    a. EF 30-35% by echo in 2013 --> similar results by repeat imaging in 2016 and 11/2017.  Marland Kitchen Constipation    corrects with diet  . Floater, vitreous    Right  . Hypertension   . Nonsquamous nonsmall cell neoplasm of lung ([facility]) 09/2011   Stage IB- s/p right upper lobectomy  . Pneumonia   . Psoriasis    Ankles  . Tobacco abuse      Allergies  Allergen Reactions  . Ibuprofen Nausea And Vomiting  . Powder Rash    Gloves with powder     Current Outpatient Medications  Medication Sig Dispense Refill  . alendronate (FOSAMAX) 70 MG tablet Take 70 mg by mouth every Monday.   3  . calcium-vitamin D (OSCAL WITH D) 500-200 MG-UNIT tablet Take 1 tablet by mouth 3 (three) times daily.    Marland Kitchen glucosamine-chondroitin 500-400 MG tablet Take 1 tablet by mouth daily.    . hydrochlorothiazide (MICROZIDE) 12.5 MG capsule Take 12.5 mg by mouth daily at 2 PM.   3  . hydrocortisone (ANUSOL-HC) 2.5 % rectal cream  Place 1 application rectally 2 (two) times daily. 30 g 1  . meclizine (ANTIVERT) 12.5 MG tablet Take 12.5 mg by mouth daily at 12 noon.     . metoprolol succinate (TOPROL XL) 25 MG 24 hr tablet Take 1 tablet (25 mg total) by mouth 2 (two) times daily. 60 tablet 6  . Multiple Vitamins-Iron (MULTIVITAMINS WITH IRON) TABS Take 1 tablet by mouth daily.    . Omega-3 Fatty Acids (FISH OIL) 500 MG CAPS Take 500 mg by mouth daily.     . vitamin B-12 (CYANOCOBALAMIN) 500 MCG tablet Take 500 mcg by mouth daily.     No current facility-administered medications for this visit.     Past Surgical History:  Procedure Laterality Date  . CATARACT EXTRACTION Bilateral 2016  . COLONOSCOPY  01/22/2012   Procedure: COLONOSCOPY;  Surgeon: Danie Binder, MD;  [facility];  Service: Endoscopy;  Laterality: N/A;  10:45  . COLONOSCOPY N/A 04/15/2017   Four 4-6 mm polyps in descending colon, ascending, and cecum. One 15 mm polyp in proximal ascending colon, one 4 mm poylp at hepatic flexure, two 3-4 mm polyps in sigmoid and descending. Internal hemorrhoids. Colonoscopy in 3-5 years if benefits outweigh risks. Simple adenomas.   Marland Kitchen  IR RADIOLOGIST EVAL & MGMT  03/04/2018  . POLYPECTOMY  04/15/2017   Procedure: POLYPECTOMY;  Surgeon: Danie Binder, MD;  [facility];  Service: Endoscopy;;  ascending colon  . RADIOLOGY WITH ANESTHESIA N/A 03/19/2018   Procedure: CT MICROWAVE THERMAL ABLATION WITH ANESTHESIA;  Surgeon: Jacqulynn Cadet, MD;  [facility];  Service: Anesthesiology;  Laterality: N/A;  . right VATS (upper lobectomy)  08/29/11   Dr Roxan Hockey  . VIDEO BRONCHOSCOPY WITH ENDOBRONCHIAL NAVIGATION N/A 03/18/2015   Procedure: VIDEO BRONCHOSCOPY WITH ENDOBRONCHIAL NAVIGATION;  Surgeon: [name], MD;  Location: [facility];  Service: Thoracic;  Laterality: N/A;     Allergies  Allergen Reactions  . Ibuprofen Nausea And Vomiting  . Powder Rash    Gloves with powder      Family  History  Problem Relation Age of Onset  . Hypertension Mother   . Heart disease Mother   . Heart disease Sister   . Heart disease Brother   . Colon cancer Neg Hx      Social History Ms. Hengel reports that she has been smoking cigarettes. She has a 6.00 pack-year smoking history. She has never used smokeless tobacco. Ms. Gallaway reports no history of alcohol use.   Review of Systems CONSTITUTIONAL: No weight loss, fever, chills, weakness or fatigue.  HEENT: Eyes: No visual loss, blurred vision, double vision or yellow sclerae.No hearing loss, sneezing, congestion, runny nose or sore throat.  SKIN: No rash or itching.  CARDIOVASCULAR: per hpi RESPIRATORY: No shortness of breath, cough or sputum.  GASTROINTESTINAL: No anorexia, nausea, vomiting or diarrhea. No abdominal pain or blood.  GENITOURINARY: No burning on urination, no polyuria NEUROLOGICAL: No headache, dizziness, syncope, paralysis, ataxia, numbness or tingling in the extremities. No change in bowel or bladder control.  MUSCULOSKELETAL: No muscle, back pain, joint pain or stiffness.  LYMPHATICS: No enlarged nodes. No history of splenectomy.  PSYCHIATRIC: No history of depression or anxiety.  ENDOCRINOLOGIC: No reports of sweating, cold or heat intolerance. No polyuria or polydipsia.  Marland Kitchen   Physical Examination Today's Vitals   04/27/20 0948  BP: 134/78  Pulse: 89  SpO2: 98%  Weight: 152 lb 12.8 oz (69.3 kg)  Height: 5\' 7"  (1.702 m)   Body mass index is 23.93 kg/m.  Gen: resting comfortably, no acute distress HEENT: no scleral icterus, pupils equal round and reactive, no palptable cervical adenopathy,  CV: RRR, no m/r/g, no jvd Resp: Clear to auscultation bilaterally GI: abdomen is soft, non-tender, non-distended, normal bowel sounds, no hepatosplenomegaly MSK: extremities are warm, no edema.  Skin: warm, no rash Neuro:  no focal deficits Psych: appropriate affect   Diagnostic Studies  04/2019  echo IMPRESSIONS    1. Left ventricular ejection fraction, by visual estimation, is 45 to  50%. The left ventricle has mildly decreased function. There is no left  ventricular hypertrophy.  2. Apical septal segment, apical inferior segment, and apex are abnormal.  3. Left ventricular diastolic parameters are consistent with Grade I  diastolic dysfunction (impaired relaxation).  4. Mildly dilated left ventricular internal cavity size.  5. Global right ventricle has normal systolic function.The right  ventricular size is normal. No increase in right ventricular wall  thickness.  6. Left atrial size was moderately dilated.  7. Right atrial size was normal.  8. Mild aortic valve annular calcification.  9. The mitral valve is myxomatous with mild bileaflet prolapse. Mild  mitral valve regurgitation.  10. The tricuspid valve is grossly normal. Tricuspid  valve regurgitation  is trivial.  11. The aortic valve is tricuspid. Aortic valve regurgitation is mild.  Mild aortic valve sclerosis without stenosis.  12. The pulmonic valve was grossly normal. Pulmonic valve regurgitation is  mild.  13. Normal pulmonary artery systolic pressure.  14. The tricuspid regurgitant velocity is 2.20 m/s, and with an assumed  right atrial pressure of 3 mmHg, the estimated right ventricular systolic  pressure is normal at 22.4 mmHg.  15. The inferior vena cava is normal in size with greater than 50%  respiratory variability, suggesting right atrial pressure of 3 mmHg.      Assessment and Plan   1. Chronic systolic HF -significantly improved LVEF by most recent echo, now low normal to mildly decreased - no symptoms, continue current meds  2. HTN - mildly elevated but has not taken meds yet, continue current meds   F/u 1 year    Arnoldo Lenis, M.D.

## 2020-04-27 NOTE — Patient Instructions (Signed)

## 2020-05-05 DIAGNOSIS — J449 Chronic obstructive pulmonary disease, unspecified: Secondary | ICD-10-CM | POA: Diagnosis not present

## 2020-05-05 DIAGNOSIS — I10 Essential (primary) hypertension: Secondary | ICD-10-CM | POA: Diagnosis not present

## 2020-05-11 NOTE — Progress Notes (Signed)
Referring Provider: Rosita Fire, MD Primary Care Physician:  Rosita Fire, MD Primary GI: Dr. Abbey Chatters  Chief Complaint  Patient presents with  . Hemorrhoids    HPI:   Heather [name] is an 79 y.o. female presenting today with a history of history of multiple adenomas in 2018, surveillance in 2-5 years if benefits outweigh the risks, chronic constipation, symptomatic prolapsing hemorrhoids. Thus far, she has had banding of right posterior, left lateral X 2, and neutrally. She has external hemorrhoid tags.   Still feels pressure. No abdominal pain, rectal bleeding, itching. Got COVID booster shot yesterday. No GERD symptoms. Only has diarrhea if eats too much ice cream. She is very concerned about the 850$ that she was billed recently. Unclear what this was referring to. She would like to have [place] look at it. On a fixed income.   Past Medical History:  Diagnosis Date  . Arthritis    reports her joints are fine and she has no pain   . Ascending aortic aneurysm ([place]) 07/20/2014   4.1 cm by CT  . Atrial fib/flutter, transient    post-op  . CHF (congestive heart failure) ([place])    a. EF 30-35% by echo in 2013 --> similar results by repeat imaging in 2016 and 11/2017.  Marland Kitchen Constipation    corrects with diet  . Floater, vitreous    Right  . Hypertension   . Nonsquamous nonsmall cell neoplasm of lung ([place]) 09/2011   Stage IB- s/p right upper lobectomy  . Pneumonia   . Psoriasis    Ankles  . Tobacco abuse     Past Surgical History:  Procedure Laterality Date  . CATARACT EXTRACTION Bilateral 2016  . COLONOSCOPY  01/22/2012   Procedure: COLONOSCOPY;  Surgeon: Danie Binder, MD;  [facility];  Service: Endoscopy;  Laterality: N/A;  10:45  . COLONOSCOPY N/A 04/15/2017   Four 4-6 mm polyps in descending colon, ascending, and cecum. One 15 mm polyp in proximal ascending colon, one 4 mm poylp at hepatic flexure, two 3-4 mm polyps in sigmoid and descending. Internal  hemorrhoids. Colonoscopy in 3-5 years if benefits outweigh risks. Simple adenomas.   . IR RADIOLOGIST EVAL & MGMT  03/04/2018  . POLYPECTOMY  04/15/2017   Procedure: POLYPECTOMY;  Surgeon: Danie Binder, MD;  [facility];  Service: Endoscopy;;  ascending colon  . RADIOLOGY WITH ANESTHESIA N/A 03/19/2018   Procedure: CT MICROWAVE THERMAL ABLATION WITH ANESTHESIA;  Surgeon: Jacqulynn Cadet, MD;  [facility];  Service: Anesthesiology;  Laterality: N/A;  . right VATS (upper lobectomy)  08/29/11   Dr Roxan Hockey  . VIDEO BRONCHOSCOPY WITH ENDOBRONCHIAL NAVIGATION N/A 03/18/2015   Procedure: VIDEO BRONCHOSCOPY WITH ENDOBRONCHIAL NAVIGATION;  Surgeon: [name], MD;  Location: [facility];  Service: Thoracic;  Laterality: N/A;    Current Outpatient Medications  Medication Sig Dispense Refill  . alendronate (FOSAMAX) 70 MG tablet Take 70 mg by mouth every Monday.   3  . calcium-vitamin D (OSCAL WITH D) 500-200 MG-UNIT tablet Take 1 tablet by mouth 3 (three) times daily.    Marland Kitchen glucosamine-chondroitin 500-400 MG tablet Take 1 tablet by mouth daily.    . hydrochlorothiazide (MICROZIDE) 12.5 MG capsule Take 12.5 mg by mouth daily at 2 PM.   3  . hydrocortisone (ANUSOL-HC) 2.5 % rectal cream Place 1 application rectally 2 (two) times daily. 30 g 1  . meclizine (ANTIVERT) 12.5 MG tablet Take 12.5 mg by  mouth daily at 12 noon.     . metoprolol succinate (TOPROL XL) 25 MG 24 hr tablet Take 1 tablet (25 mg total) by mouth 2 (two) times daily. 60 tablet 6  . Multiple Vitamins-Iron (MULTIVITAMINS WITH IRON) TABS Take 1 tablet by mouth daily.    . Omega-3 Fatty Acids (FISH OIL) 500 MG CAPS Take 500 mg by mouth daily.     . vitamin B-12 (CYANOCOBALAMIN) 500 MCG tablet Take 500 mcg by mouth daily.     No current facility-administered medications for this visit.    Allergies as of 05/12/2020 - Review Complete 05/12/2020  Allergen Reaction Noted  . Ibuprofen Nausea And Vomiting 01/07/2012    . Powder Rash 03/20/2017    Family History  Problem Relation Age of Onset  . Hypertension Mother   . Heart disease Mother   . Heart disease Sister   . Heart disease Brother   . Colon cancer Neg Hx     Social History   Socioeconomic History  . Marital status: Widowed    Spouse name: Not on file  . Number of children: 3  . Years of education: Not on file  . Highest education level: Not on file  Occupational History  . Occupation: retired  Tobacco Use  . Smoking status: Current Some Day Smoker    Packs/day: 0.30    Years: 20.00    Pack years: 6.00    Types: Cigarettes    Last attempt to quit: 01/12/2014    Years since quitting: 6.3  . Smokeless tobacco: Never Used  . Tobacco comment: "I smoke about 1 cigarette a day on average"  Vaping Use  . Vaping Use: Never used  Substance and Sexual Activity  . Alcohol use: No  . Drug use: No  . Sexual activity: Not on file  Other Topics Concern  . Not on file  Social History Narrative   Lives w/ cousin   Social Determinants of Health   Financial Resource Strain:   . Difficulty of Paying Living Expenses: Not on file  Food Insecurity:   . Worried About Charity fundraiser in the Last Year: Not on file  . Ran Out of Food in the Last Year: Not on file  Transportation Needs:   . Lack of Transportation (Medical): Not on file  . Lack of Transportation (Non-Medical): Not on file  Physical Activity:   . Days of Exercise per Week: Not on file  . Minutes of Exercise per Session: Not on file  Stress:   . Feeling of Stress : Not on file  Social Connections:   . Frequency of Communication with Friends and Family: Not on file  . Frequency of Social Gatherings with Friends and Family: Not on file  . Attends Religious Services: Not on file  . Active Member of Clubs or Organizations: Not on file  . Attends Archivist Meetings: Not on file  . Marital Status: Not on file    Review of Systems: Gen: Denies fever, chills,  anorexia. Denies fatigue, weakness, weight loss.  CV: Denies chest pain, palpitations, syncope, peripheral edema, and claudication. Resp: Denies dyspnea at rest, cough, wheezing, coughing up blood, and pleurisy. GI: see HPI Derm: Denies rash, itching, dry skin Psych: Denies depression, anxiety, memory loss, confusion. No homicidal or suicidal ideation.  Heme: Denies bruising, bleeding, and enlarged lymph nodes.  Physical Exam: BP 136/86   Pulse 67   Temp 97.6 F (36.4 C)   Ht 5\' 7"  (1.702 m)  Wt 156 lb 3.2 oz (70.9 kg)   BMI 24.46 kg/m  General:   Alert and oriented. No distress noted. Pleasant and cooperative.  Head:  Normocephalic and atraumatic. Eyes:  Conjuctiva clear without scleral icterus. Mouth:  Mask in place Abdomen:  +BS, soft, non-tender and non-distended. No rebound or guarding. No HSM or masses noted. Msk:  Symmetrical without gross deformities. Normal posture. Extremities:  Without edema. Neurologic:  Alert and  oriented x4 Psych:  Alert and cooperative. Normal mood and affect.  ASSESSMENT: Heather [name] is an 79 y.o. female presenting today with history of prolapsing hemorrhoids, s/p banding as noted above. We have maximized banding in the outpatient setting, and I feel her sensation of "prolapsing" is actually the redundant tissue from hemorrhoid tags. Discussed with her we are unable to fix this with banding and would need excision. Unfortunately, she was billed 850$ recently but does not have this with her. I have asked she bring a copy for [place] to review. Prior bandings have had no issues with reimbursement.    PLAN:  Return in 6-8 months  No further bandings  Call if rectal bleeding, changes in bowel habits  History of adenomas but following conservatively for now  Annitta Needs, PhD, ANP-BC [facility] Gastroenterology

## 2020-05-12 ENCOUNTER — Encounter: Payer: Self-pay | Admitting: Gastroenterology

## 2020-05-12 ENCOUNTER — Ambulatory Visit (INDEPENDENT_AMBULATORY_CARE_PROVIDER_SITE_OTHER): Payer: Medicare Other | Admitting: Gastroenterology

## 2020-05-12 ENCOUNTER — Other Ambulatory Visit: Payer: Self-pay

## 2020-05-12 VITALS — BP 136/86 | HR 67 | Temp 97.6°F | Ht 67.0 in | Wt 156.2 lb

## 2020-05-12 DIAGNOSIS — K642 Third degree hemorrhoids: Secondary | ICD-10-CM

## 2020-05-12 NOTE — Patient Instructions (Signed)
Please bring the bill by so we can check it out.  We will see you in 6-8 months or sooner if needed!  I enjoyed seeing you again today! As you know, I value our relationship and want to provide genuine, compassionate, and quality care. I welcome your feedback. If you receive a survey regarding your visit,  I greatly appreciate you taking time to fill this out. See you next time!  Annitta Needs, PhD, ANP-BC [facility] Gastroenterology

## 2020-06-05 DIAGNOSIS — J449 Chronic obstructive pulmonary disease, unspecified: Secondary | ICD-10-CM | POA: Diagnosis not present

## 2020-06-05 DIAGNOSIS — I10 Essential (primary) hypertension: Secondary | ICD-10-CM | POA: Diagnosis not present

## 2020-07-05 DIAGNOSIS — I10 Essential (primary) hypertension: Secondary | ICD-10-CM | POA: Diagnosis not present

## 2020-07-05 DIAGNOSIS — C3490 Malignant neoplasm of unspecified part of unspecified bronchus or lung: Secondary | ICD-10-CM | POA: Diagnosis not present

## 2020-08-05 DIAGNOSIS — I10 Essential (primary) hypertension: Secondary | ICD-10-CM | POA: Diagnosis not present

## 2020-08-05 DIAGNOSIS — J449 Chronic obstructive pulmonary disease, unspecified: Secondary | ICD-10-CM | POA: Diagnosis not present

## 2020-08-11 DIAGNOSIS — Z961 Presence of intraocular lens: Secondary | ICD-10-CM | POA: Diagnosis not present

## 2020-08-11 DIAGNOSIS — H5213 Myopia, bilateral: Secondary | ICD-10-CM | POA: Diagnosis not present

## 2020-08-11 DIAGNOSIS — H43813 Vitreous degeneration, bilateral: Secondary | ICD-10-CM | POA: Diagnosis not present

## 2020-08-11 DIAGNOSIS — H524 Presbyopia: Secondary | ICD-10-CM | POA: Diagnosis not present

## 2020-09-05 DIAGNOSIS — I5022 Chronic systolic (congestive) heart failure: Secondary | ICD-10-CM | POA: Diagnosis not present

## 2020-09-05 DIAGNOSIS — I10 Essential (primary) hypertension: Secondary | ICD-10-CM | POA: Diagnosis not present

## 2020-09-09 DIAGNOSIS — H5203 Hypermetropia, bilateral: Secondary | ICD-10-CM | POA: Diagnosis not present

## 2020-09-09 DIAGNOSIS — H52221 Regular astigmatism, right eye: Secondary | ICD-10-CM | POA: Diagnosis not present

## 2020-09-29 ENCOUNTER — Other Ambulatory Visit: Payer: Medicare Other | Admitting: *Deleted
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
## 2020-09-30 ENCOUNTER — Inpatient Hospital Stay: Payer: Medicare Other | Attending: Internal Medicine

## 2020-09-30 ENCOUNTER — Other Ambulatory Visit: Payer: Self-pay

## 2020-09-30 ENCOUNTER — Other Ambulatory Visit: Payer: Self-pay | Admitting: *Deleted

## 2020-09-30 ENCOUNTER — Ambulatory Visit (HOSPITAL_COMMUNITY)
Admission: RE | Admit: 2020-09-30 | Discharge: 2020-09-30 | Disposition: A | Discharge status: Home / Self Care | Payer: Medicare Other | Source: Ambulatory Visit | Attending: Internal Medicine | Admitting: Internal Medicine

## 2020-09-30 DIAGNOSIS — R0602 Shortness of breath: Secondary | ICD-10-CM | POA: Insufficient documentation

## 2020-09-30 DIAGNOSIS — C3490 Malignant neoplasm of unspecified part of unspecified bronchus or lung: Secondary | ICD-10-CM | POA: Diagnosis not present

## 2020-09-30 DIAGNOSIS — Z902 Acquired absence of lung [part of]: Secondary | ICD-10-CM | POA: Diagnosis not present

## 2020-09-30 DIAGNOSIS — R0609 Other forms of dyspnea: Secondary | ICD-10-CM | POA: Insufficient documentation

## 2020-09-30 DIAGNOSIS — Z8719 Personal history of other diseases of the digestive system: Secondary | ICD-10-CM | POA: Diagnosis not present

## 2020-09-30 DIAGNOSIS — Z79899 Other long term (current) drug therapy: Secondary | ICD-10-CM | POA: Diagnosis not present

## 2020-09-30 DIAGNOSIS — C3412 Malignant neoplasm of upper lobe, left bronchus or lung: Secondary | ICD-10-CM | POA: Diagnosis not present

## 2020-09-30 DIAGNOSIS — Z886 Allergy status to analgesic agent status: Secondary | ICD-10-CM | POA: Insufficient documentation

## 2020-09-30 DIAGNOSIS — I700 Atherosclerosis of aorta: Secondary | ICD-10-CM | POA: Insufficient documentation

## 2020-09-30 DIAGNOSIS — I110 Hypertensive heart disease with heart failure: Secondary | ICD-10-CM | POA: Diagnosis not present

## 2020-09-30 DIAGNOSIS — I714 Abdominal aortic aneurysm, without rupture: Secondary | ICD-10-CM | POA: Diagnosis not present

## 2020-09-30 DIAGNOSIS — C3411 Malignant neoplasm of upper lobe, right bronchus or lung: Secondary | ICD-10-CM | POA: Diagnosis not present

## 2020-09-30 DIAGNOSIS — I2510 Atherosclerotic heart disease of native coronary artery without angina pectoris: Secondary | ICD-10-CM | POA: Insufficient documentation

## 2020-09-30 LAB — CBC WITH DIFFERENTIAL (CANCER CENTER ONLY)
Abs Immature Granulocytes: 0.01 10*3/uL (ref 0.00–0.07)
Basophils Absolute: 0 10*3/uL (ref 0.0–0.1)
Basophils Relative: 0 %
Eosinophils Absolute: 0.1 10*3/uL (ref 0.0–0.5)
Eosinophils Relative: 1 %
HCT: 37.8 % (ref 36.0–46.0)
Hemoglobin: 12.5 g/dL (ref 12.0–15.0)
Immature Granulocytes: 0 %
Lymphocytes Relative: 55 %
Lymphs Abs: 4.8 10*3/uL — ABNORMAL HIGH (ref 0.7–4.0)
MCH: 30.9 pg (ref 26.0–34.0)
MCHC: 33.1 g/dL (ref 30.0–36.0)
MCV: 93.3 fL (ref 80.0–100.0)
Monocytes Absolute: 0.6 10*3/uL (ref 0.1–1.0)
Monocytes Relative: 7 %
Neutro Abs: 3.2 10*3/uL (ref 1.7–7.7)
Neutrophils Relative %: 37 %
Platelet Count: 197 10*3/uL (ref 150–400)
RBC: 4.05 MIL/uL (ref 3.87–5.11)
RDW: 14.1 % (ref 11.5–15.5)
WBC Count: 8.7 10*3/uL (ref 4.0–10.5)
nRBC: 0 % (ref 0.0–0.2)

## 2020-09-30 LAB — CMP (CANCER CENTER ONLY)
ALT: 12 U/L (ref 0–44)
AST: 18 U/L (ref 15–41)
Albumin: 4.1 g/dL (ref 3.5–5.0)
Alkaline Phosphatase: 76 U/L (ref 38–126)
Anion gap: 12 (ref 5–15)
BUN: 18 mg/dL (ref 8–23)
CO2: 29 mmol/L (ref 22–32)
Calcium: 9.8 mg/dL (ref 8.9–10.3)
Chloride: 104 mmol/L (ref 98–111)
Creatinine: 1.18 mg/dL — ABNORMAL HIGH (ref 0.44–1.00)
GFR, Estimated: 47 mL/min — ABNORMAL LOW (ref >60)
Glucose, Bld: 103 mg/dL — ABNORMAL HIGH (ref 70–99)
Potassium: 3.6 mmol/L (ref 3.5–5.1)
Sodium: 145 mmol/L (ref 135–145)
Total Bilirubin: 1 mg/dL (ref 0.3–1.2)
Total Protein: 7.5 g/dL (ref 6.5–8.1)

## 2020-09-30 MED ORDER — IOHEXOL 300 MG/ML  SOLN
75.0000 mL | Freq: Once | INTRAMUSCULAR | Status: AC | PRN
  Administered 2020-09-30: 75 mL via INTRAVENOUS

## 2020-09-30 NOTE — Patient Outreach (Signed)
[facility]) Care Management  09/30/2020  REIKO VINJE 30-Nov-1940 [id number]  Second outreach for [facility] Referral for complex care management, unsuccessful. Will send unsuccessful letter and call again in 2 weeks.  Eulah Pont. Myrtie Neither, MSN, [facility] Gerontological Nurse Practitioner [facility] Care Management [phone]

## 2020-10-03 ENCOUNTER — Other Ambulatory Visit: Payer: Self-pay

## 2020-10-03 ENCOUNTER — Inpatient Hospital Stay (HOSPITAL_BASED_OUTPATIENT_CLINIC_OR_DEPARTMENT_OTHER): Payer: Medicare Other | Admitting: Internal Medicine

## 2020-10-03 ENCOUNTER — Telehealth: Payer: Self-pay | Admitting: Internal Medicine

## 2020-10-03 VITALS — BP 154/93 | HR 58 | Temp 97.8°F | Resp 18 | Ht 67.0 in | Wt 162.1 lb

## 2020-10-03 DIAGNOSIS — C3490 Malignant neoplasm of unspecified part of unspecified bronchus or lung: Secondary | ICD-10-CM

## 2020-10-03 DIAGNOSIS — C3412 Malignant neoplasm of upper lobe, left bronchus or lung: Secondary | ICD-10-CM | POA: Diagnosis not present

## 2020-10-03 DIAGNOSIS — I110 Hypertensive heart disease with heart failure: Secondary | ICD-10-CM | POA: Diagnosis not present

## 2020-10-03 DIAGNOSIS — Z8719 Personal history of other diseases of the digestive system: Secondary | ICD-10-CM | POA: Diagnosis not present

## 2020-10-03 DIAGNOSIS — R0609 Other forms of dyspnea: Secondary | ICD-10-CM | POA: Diagnosis not present

## 2020-10-03 DIAGNOSIS — I5022 Chronic systolic (congestive) heart failure: Secondary | ICD-10-CM | POA: Diagnosis not present

## 2020-10-03 DIAGNOSIS — Z79899 Other long term (current) drug therapy: Secondary | ICD-10-CM | POA: Diagnosis not present

## 2020-10-03 DIAGNOSIS — R0602 Shortness of breath: Secondary | ICD-10-CM | POA: Diagnosis not present

## 2020-10-03 DIAGNOSIS — I700 Atherosclerosis of aorta: Secondary | ICD-10-CM | POA: Diagnosis not present

## 2020-10-03 DIAGNOSIS — J449 Chronic obstructive pulmonary disease, unspecified: Secondary | ICD-10-CM | POA: Diagnosis not present

## 2020-10-03 DIAGNOSIS — Z902 Acquired absence of lung [part of]: Secondary | ICD-10-CM | POA: Diagnosis not present

## 2020-10-03 DIAGNOSIS — Z886 Allergy status to analgesic agent status: Secondary | ICD-10-CM | POA: Diagnosis not present

## 2020-10-03 DIAGNOSIS — I2510 Atherosclerotic heart disease of native coronary artery without angina pectoris: Secondary | ICD-10-CM | POA: Diagnosis not present

## 2020-10-03 NOTE — Progress Notes (Signed)
[place] [phone]   Fax:([phone]) [place], [place], MD [address] [place] [address]  DIAGNOSIS:  1) Stage IA non-small cell lung cancer diagnosed in March 2017.  2) She now has recurrent disease in the left upper lobe suspicious for stage IA.  She also has some other small pulmonary nodules that could be low-grade adenocarcinoma.  PRIOR THERAPY:  1) status post right upper lobectomy with lymph node dissection on September 24, 2011 2) status post stereotactic radiotherapy to the left upper lobe lung nodule in February 2019.   CURRENT THERAPY: Observation.  INTERVAL HISTORY: Heather [name] 80 y.o. female returns to the clinic today for follow-up visit.  The patient is feeling fine today with no concerning complaints except for mild shortness of breath with exertion.  She denied having any current chest pain, cough or hemoptysis.  She denied having any fever or chills.  She has no nausea, vomiting, diarrhea or constipation.  She has no headache or visual changes.  She denied having any weight loss or night sweats.  She is currently on observation.  She had repeat CT scan of the chest performed recently and she is here for evaluation and discussion of her scan results and treatment options.  MEDICAL HISTORY: Past Medical History:  Diagnosis Date  . Arthritis    reports her joints are fine and she has no pain   . Ascending aortic aneurysm ([place]) 07/20/2014   4.1 cm by CT  . Atrial fib/flutter, transient    post-op  . CHF (congestive heart failure) ([place])    a. EF 30-35% by echo in 2013 --> similar results by repeat imaging in 2016 and 11/2017.  Marland Kitchen Constipation    corrects with diet  . Floater, vitreous    Right  . Hypertension   . Nonsquamous nonsmall cell neoplasm of lung ([place]) 09/2011   Stage IB- s/p right upper lobectomy  . Pneumonia   . Psoriasis    Ankles  . Tobacco abuse     ALLERGIES:  is  allergic to ibuprofen and powder.  MEDICATIONS:  Current Outpatient Medications  Medication Sig Dispense Refill  . alendronate (FOSAMAX) 70 MG tablet Take 70 mg by mouth every Monday.   3  . calcium-vitamin D (OSCAL WITH D) 500-200 MG-UNIT tablet Take 1 tablet by mouth 3 (three) times daily.    Marland Kitchen glucosamine-chondroitin 500-400 MG tablet Take 1 tablet by mouth daily.    . hydrochlorothiazide (MICROZIDE) 12.5 MG capsule Take 12.5 mg by mouth daily at 2 PM.   3  . hydrocortisone (ANUSOL-HC) 2.5 % rectal cream Place 1 application rectally 2 (two) times daily. 30 g 1  . meclizine (ANTIVERT) 12.5 MG tablet Take 12.5 mg by mouth daily at 12 noon.     . metoprolol succinate (TOPROL XL) 25 MG 24 hr tablet Take 1 tablet (25 mg total) by mouth 2 (two) times daily. 60 tablet 6  . Multiple Vitamins-Iron (MULTIVITAMINS WITH IRON) TABS Take 1 tablet by mouth daily.    . Omega-3 Fatty Acids (FISH OIL) 500 MG CAPS Take 500 mg by mouth daily.     . vitamin B-12 (CYANOCOBALAMIN) 500 MCG tablet Take 500 mcg by mouth daily.     No current facility-administered medications for this visit.    SURGICAL HISTORY:  Past Surgical History:  Procedure Laterality Date  . CATARACT EXTRACTION Bilateral 2016  . COLONOSCOPY  01/22/2012   Procedure:  COLONOSCOPY;  Surgeon: Danie Binder, MD;  [facility];  Service: Endoscopy;  Laterality: N/A;  10:45  . COLONOSCOPY N/A 04/15/2017   Four 4-6 mm polyps in descending colon, ascending, and cecum. One 15 mm polyp in proximal ascending colon, one 4 mm poylp at hepatic flexure, two 3-4 mm polyps in sigmoid and descending. Internal hemorrhoids. Colonoscopy in 3-5 years if benefits outweigh risks. Simple adenomas.   . IR RADIOLOGIST EVAL & MGMT  03/04/2018  . POLYPECTOMY  04/15/2017   Procedure: POLYPECTOMY;  Surgeon: Danie Binder, MD;  [facility];  Service: Endoscopy;;  ascending colon  . RADIOLOGY WITH ANESTHESIA N/A 03/19/2018   Procedure: CT MICROWAVE  THERMAL ABLATION WITH ANESTHESIA;  Surgeon: Jacqulynn Cadet, MD;  [facility];  Service: Anesthesiology;  Laterality: N/A;  . right VATS (upper lobectomy)  08/29/11   Dr Roxan Hockey  . VIDEO BRONCHOSCOPY WITH ENDOBRONCHIAL NAVIGATION N/A 03/18/2015   Procedure: VIDEO BRONCHOSCOPY WITH ENDOBRONCHIAL NAVIGATION;  Surgeon: [name], MD;  Location: [place];  Service: Thoracic;  Laterality: N/A;    REVIEW OF SYSTEMS:  A comprehensive review of systems was negative except for: Respiratory: positive for dyspnea on exertion   PHYSICAL EXAMINATION: General appearance: alert, cooperative and no distress Head: Normocephalic, without obvious abnormality, atraumatic Neck: no adenopathy, no JVD, supple, symmetrical, trachea midline and thyroid not enlarged, symmetric, no tenderness/mass/nodules Lymph nodes: Cervical, supraclavicular, and axillary nodes normal. Resp: clear to auscultation bilaterally Back: symmetric, no curvature. ROM normal. No CVA tenderness. Cardio: regular rate and rhythm, S1, S2 normal, no murmur, click, rub or gallop GI: soft, non-tender; bowel sounds normal; no masses,  no organomegaly Extremities: extremities normal, atraumatic, no cyanosis or edema  ECOG PERFORMANCE STATUS: 1 - Symptomatic but completely ambulatory  Blood pressure (!) 154/93, pulse (!) 58, temperature 97.8 F (36.6 C), temperature source Oral, resp. rate 18, height 5\' 7"  (1.702 m), weight 162 lb 1.6 oz (73.5 kg), SpO2 100 %.  LABORATORY DATA: Lab Results  Component Value Date   WBC 8.7 09/30/2020   HGB 12.5 09/30/2020   HCT 37.8 09/30/2020   MCV 93.3 09/30/2020   PLT 197 09/30/2020      Chemistry      Component Value Date/Time   NA 145 09/30/2020 1337   K 3.6 09/30/2020 1337   CL 104 09/30/2020 1337   CO2 29 09/30/2020 1337   BUN 18 09/30/2020 1337   CREATININE 1.18 (H) 09/30/2020 1337      Component Value Date/Time   CALCIUM 9.8 09/30/2020 1337   ALKPHOS 76 09/30/2020 1337    AST 18 09/30/2020 1337   ALT 12 09/30/2020 1337   BILITOT 1.0 09/30/2020 1337       RADIOGRAPHIC STUDIES: CT Chest W Contrast  Result Date: 10/01/2020 CLINICAL DATA:  Non-small cell lung cancer staging, status post right upper lobectomy EXAM: CT CHEST WITH CONTRAST TECHNIQUE: Multidetector CT imaging of the chest was performed during intravenous contrast administration. CONTRAST:  46mL OMNIPAQUE IOHEXOL 300 MG/ML  SOLN COMPARISON:  03/17/2020, 09/14/2019 FINDINGS: Cardiovascular: Aortic atherosclerosis. Unchanged aneurysm of the included upper abdominal aorta, measuring up to 3.4 x 3.2 cm (series 3, 444). Cardiomegaly. Left coronary artery calcifications. No pericardial effusion. Mediastinum/Nodes: No enlarged mediastinal, hilar, or axillary lymph nodes. Thyroid gland, trachea, and esophagus demonstrate no significant findings. Lungs/Pleura: Redemonstrated postoperative findings status post right upper lobectomy. Slight interval reduction in size of a predominantly flattened and bandlike treated nodule of the paramedian left upper lobe, measuring  approximately 2.6 x 1.9 cm (series 8, image 55). Multiple redemonstrated bilateral mixed solid and ground-glass opacity throughout the lungs. A lesion of the left pulmonary apex measures 1.6 x 1.4 cm, slightly enlarged compared to prior examination, at which time it measured 1.3 x 1.3 cm when measured similarly and with an increased solid character (series 8, image 27). Other nodules and opacities are unchanged, for example a a predominantly solid nodule of the medial superior segment right lower lobe measuring 1.9 x 1.1 cm (series 8, image 42). No pleural effusion or pneumothorax. Upper Abdomen: No acute abnormality. Musculoskeletal: No chest wall mass or suspicious bone lesions identified. IMPRESSION: 1. Redemonstrated postoperative findings status post right upper lobectomy. 2. Slight interval reduction in size of a predominantly flattened and bandlike treated  nodule of the paramedian left upper lobe, likely reflecting of a lesion of radiation fibrosis. 3. Multiple redemonstrated bilateral mixed solid and ground-glass opacities throughout the lungs. A lesion of the left pulmonary apex measures 1.6 x 1.4 cm, slightly enlarged compared to prior examination, at which time it measured 1.3 x 1.3 cm when measured similarly and with an increased solid character. This nodule, particularly with interval change, is suspicious for indolent adenocarcinoma. This could be characterized for metabolic activity by FDG PET. 4. Other nodules and opacities are unchanged, and several remain morphologically suspicious for indolent multifocal adenocarcinoma. 5. Cardiomegaly and coronary artery disease. 6. Redemonstrated aneurysm of the included upper abdominal aorta measuring 3.4 x 3.2 cm. Consider dedicated angiogram of the abdominal aorta further evaluate. This recommendation follows ACR consensus guidelines: White Paper of the ACR Incidental Findings Committee II on Vascular Findings. J Am Coll Radiol 2013; 10:789-794. Aortic Atherosclerosis (ICD10-I70.0). Electronically Signed   By: Eddie Candle M.D.   On: 10/01/2020 11:29    ASSESSMENT AND PLAN: This is a very pleasant 80 years old African-American female with recurrent non-small cell lung cancer presented with right upper lobe lung nodule that was previously treated with a stereotactic radiotherapy. The patient is currently on observation and she is feeling fine today with no concerning complaints except for mild shortness of breath. She had repeat CT scan of the chest performed recently.  I personally and independently reviewed the scan images and discussed the result and showed the images to the patient today. Her scan showed a stable disease except for slightly enlarged left pulmonary apex lesion which currently measures 1.6 x 1.4 cm suspicious for indolent adenocarcinoma and a PET scan was recommended for further evaluation. I  recommended for the patient to have a PET scan done in the next 1-2 weeks and I will see her back for follow-up visit after the scan.  If the PET scan showed hypermetabolic activity, I would consider referring the patient to radiation oncology for SBRT. She will come back for follow-up visit in around 3 weeks for evaluation and discussion of the PET scan results and treatment options. The patient was advised to call immediately if she has any other concerning symptoms in the interval. The patient voices understanding of current disease status and treatment options and is in agreement with the current care plan. All questions were answered. The patient knows to call the clinic with any problems, questions or concerns. We can certainly see the patient much sooner if necessary.   Disclaimer: This note was dictated with voice recognition software. Similar sounding words can inadvertently be transcribed and may not be corrected upon review.

## 2020-10-03 NOTE — Telephone Encounter (Signed)
Scheduled per los. Gave avs and calendar  

## 2020-10-10 DIAGNOSIS — Z1389 Encounter for screening for other disorder: Secondary | ICD-10-CM | POA: Diagnosis not present

## 2020-10-10 DIAGNOSIS — Z79899 Other long term (current) drug therapy: Secondary | ICD-10-CM | POA: Diagnosis not present

## 2020-10-10 DIAGNOSIS — M4800 Spinal stenosis, site unspecified: Secondary | ICD-10-CM | POA: Diagnosis not present

## 2020-10-10 DIAGNOSIS — Z0001 Encounter for general adult medical examination with abnormal findings: Secondary | ICD-10-CM | POA: Diagnosis not present

## 2020-10-10 DIAGNOSIS — M810 Age-related osteoporosis without current pathological fracture: Secondary | ICD-10-CM | POA: Diagnosis not present

## 2020-10-10 DIAGNOSIS — F17210 Nicotine dependence, cigarettes, uncomplicated: Secondary | ICD-10-CM | POA: Diagnosis not present

## 2020-10-10 DIAGNOSIS — I119 Hypertensive heart disease without heart failure: Secondary | ICD-10-CM | POA: Diagnosis not present

## 2020-10-13 ENCOUNTER — Other Ambulatory Visit: Payer: Self-pay

## 2020-10-13 ENCOUNTER — Other Ambulatory Visit: Payer: Self-pay | Admitting: *Deleted

## 2020-10-13 ENCOUNTER — Encounter (HOSPITAL_COMMUNITY)
Admission: RE | Admit: 2020-10-13 | Discharge: 2020-10-13 | Disposition: A | Discharge status: Home / Self Care | Payer: Medicare Other | Source: Ambulatory Visit | Attending: Internal Medicine | Admitting: Internal Medicine

## 2020-10-13 DIAGNOSIS — C3490 Malignant neoplasm of unspecified part of unspecified bronchus or lung: Secondary | ICD-10-CM | POA: Insufficient documentation

## 2020-10-13 LAB — GLUCOSE, CAPILLARY: Glucose-Capillary: 97 mg/dL (ref 70–99)

## 2020-10-13 MED ORDER — FLUDEOXYGLUCOSE F - 18 (FDG) INJECTION
8.1000 | Freq: Once | INTRAVENOUS | Status: AC
  Administered 2020-10-13: 8.36 via INTRAVENOUS

## 2020-10-13 NOTE — Patient Outreach (Signed)
[facility]) Care Management  10/13/2020  Heather [name] January 05, 1941 [id number]  Third outreach for care management per [facility] referral. Pt was also send a letter and did not respond. I will remove her from the pending list.  Kayleen Memos C. Myrtie Neither, MSN, [facility] Gerontological Nurse Practitioner [facility] Care Management [phone]

## 2020-10-17 ENCOUNTER — Encounter: Payer: Self-pay | Admitting: Internal Medicine

## 2020-10-17 ENCOUNTER — Other Ambulatory Visit: Payer: Self-pay

## 2020-10-17 ENCOUNTER — Encounter: Payer: Self-pay | Admitting: *Deleted

## 2020-10-17 ENCOUNTER — Inpatient Hospital Stay: Payer: Medicare Other | Attending: Internal Medicine | Admitting: Internal Medicine

## 2020-10-17 VITALS — BP 136/95 | HR 63 | Temp 97.9°F | Resp 17 | Ht 67.0 in | Wt 158.5 lb

## 2020-10-17 DIAGNOSIS — R0602 Shortness of breath: Secondary | ICD-10-CM | POA: Insufficient documentation

## 2020-10-17 DIAGNOSIS — Z886 Allergy status to analgesic agent status: Secondary | ICD-10-CM | POA: Insufficient documentation

## 2020-10-17 DIAGNOSIS — Z79899 Other long term (current) drug therapy: Secondary | ICD-10-CM | POA: Insufficient documentation

## 2020-10-17 DIAGNOSIS — C3490 Malignant neoplasm of unspecified part of unspecified bronchus or lung: Secondary | ICD-10-CM | POA: Diagnosis not present

## 2020-10-17 DIAGNOSIS — Z902 Acquired absence of lung [part of]: Secondary | ICD-10-CM | POA: Insufficient documentation

## 2020-10-17 DIAGNOSIS — Z8719 Personal history of other diseases of the digestive system: Secondary | ICD-10-CM | POA: Diagnosis not present

## 2020-10-17 DIAGNOSIS — C3412 Malignant neoplasm of upper lobe, left bronchus or lung: Secondary | ICD-10-CM | POA: Insufficient documentation

## 2020-10-17 DIAGNOSIS — I2510 Atherosclerotic heart disease of native coronary artery without angina pectoris: Secondary | ICD-10-CM | POA: Diagnosis not present

## 2020-10-17 DIAGNOSIS — I700 Atherosclerosis of aorta: Secondary | ICD-10-CM | POA: Insufficient documentation

## 2020-10-17 DIAGNOSIS — C3491 Malignant neoplasm of unspecified part of right bronchus or lung: Secondary | ICD-10-CM

## 2020-10-17 NOTE — Progress Notes (Signed)
[place] [phone]   Fax:([phone]) [place], [place], MD [address] [place] [address]  DIAGNOSIS:  1) Stage IA non-small cell lung cancer diagnosed in March 2017.  2) She now has recurrent disease in the left upper lobe suspicious for stage IA.  She also has some other small pulmonary nodules that could be low-grade adenocarcinoma.  PRIOR THERAPY:  1) status post right upper lobectomy with lymph node dissection on September 24, 2011 2) status post stereotactic radiotherapy to the left upper lobe lung nodule in February 2019.  CURRENT THERAPY: Observation.  INTERVAL HISTORY: Heather [name] 80 y.o. female returns to the clinic today for follow-up visit.  The patient is feeling fine today with no concerning complaints.  She denied having any chest pain, shortness of breath, cough or hemoptysis.  She denied having any fever or chills.  She has no nausea, vomiting, diarrhea or constipation.  She had a PET scan performed recently and she is here for evaluation and discussion of her scan results and treatment options.  MEDICAL HISTORY: Past Medical History:  Diagnosis Date  . Arthritis    reports her joints are fine and she has no pain   . Ascending aortic aneurysm ([facility]) 07/20/2014   4.1 cm by CT  . Atrial fib/flutter, transient    post-op  . CHF (congestive heart failure) ([facility])    a. EF 30-35% by echo in 2013 --> similar results by repeat imaging in 2016 and 11/2017.  Marland Kitchen Constipation    corrects with diet  . Floater, vitreous    Right  . Hypertension   . Nonsquamous nonsmall cell neoplasm of lung ([facility]) 09/2011   Stage IB- s/p right upper lobectomy  . Pneumonia   . Psoriasis    Ankles  . Tobacco abuse     ALLERGIES:  is allergic to ibuprofen and powder.  MEDICATIONS:  Current Outpatient Medications  Medication Sig Dispense Refill  . alendronate (FOSAMAX) 70 MG tablet Take 70 mg by mouth every Monday.    3  . Calcium Carb-Cholecalciferol (OYSTER SHELL CALCIUM W/D) 500-200 MG-UNIT TABS Take 1 tablet by mouth 3 (three) times daily.    Marland Kitchen glucosamine-chondroitin 500-400 MG tablet Take 1 tablet by mouth daily.    . hydrochlorothiazide (MICROZIDE) 12.5 MG capsule Take 12.5 mg by mouth daily at 2 PM.   3  . hydrocortisone (ANUSOL-HC) 2.5 % rectal cream Place 1 application rectally 2 (two) times daily. 30 g 1  . meclizine (ANTIVERT) 12.5 MG tablet Take 12.5 mg by mouth daily at 12 noon.     . metoprolol succinate (TOPROL XL) 25 MG 24 hr tablet Take 1 tablet (25 mg total) by mouth 2 (two) times daily. 60 tablet 6  . Multiple Vitamins-Iron (MULTIVITAMINS WITH IRON) TABS Take 1 tablet by mouth daily.    . Omega-3 Fatty Acids (FISH OIL) 500 MG CAPS Take 500 mg by mouth daily.     . vitamin B-12 (CYANOCOBALAMIN) 500 MCG tablet Take 500 mcg by mouth daily.     No current facility-administered medications for this visit.    SURGICAL HISTORY:  Past Surgical History:  Procedure Laterality Date  . CATARACT EXTRACTION Bilateral 2016  . COLONOSCOPY  01/22/2012   Procedure: COLONOSCOPY;  Surgeon: Danie Binder, MD;  Location: [facility];  Service: Endoscopy;  Laterality: N/A;  10:45  . COLONOSCOPY N/A 04/15/2017   Four 4-6 mm polyps in descending  colon, ascending, and cecum. One 15 mm polyp in proximal ascending colon, one 4 mm poylp at hepatic flexure, two 3-4 mm polyps in sigmoid and descending. Internal hemorrhoids. Colonoscopy in 3-5 years if benefits outweigh risks. Simple adenomas.   . IR RADIOLOGIST EVAL & MGMT  03/04/2018  . POLYPECTOMY  04/15/2017   Procedure: POLYPECTOMY;  Surgeon: Danie Binder, MD;  Location: [facility];  Service: Endoscopy;;  ascending colon  . RADIOLOGY WITH ANESTHESIA N/A 03/19/2018   Procedure: CT MICROWAVE THERMAL ABLATION WITH ANESTHESIA;  Surgeon: Jacqulynn Cadet, MD;  Location: [facility];  Service: Anesthesiology;  Laterality: N/A;  . right VATS (upper lobectomy)   08/29/11   Dr Roxan Hockey  . VIDEO BRONCHOSCOPY WITH ENDOBRONCHIAL NAVIGATION N/A 03/18/2015   Procedure: VIDEO BRONCHOSCOPY WITH ENDOBRONCHIAL NAVIGATION;  Surgeon: [name], MD;  Location: [facility];  Service: Thoracic;  Laterality: N/A;    REVIEW OF SYSTEMS:  A comprehensive review of systems was negative.   PHYSICAL EXAMINATION: General appearance: alert, cooperative and no distress Head: Normocephalic, without obvious abnormality, atraumatic Neck: no adenopathy, no JVD, supple, symmetrical, trachea midline and thyroid not enlarged, symmetric, no tenderness/mass/nodules Lymph nodes: Cervical, supraclavicular, and axillary nodes normal. Resp: clear to auscultation bilaterally Back: symmetric, no curvature. ROM normal. No CVA tenderness. Cardio: regular rate and rhythm, S1, S2 normal, no murmur, click, rub or gallop GI: soft, non-tender; bowel sounds normal; no masses,  no organomegaly Extremities: extremities normal, atraumatic, no cyanosis or edema  ECOG PERFORMANCE STATUS: 1 - Symptomatic but completely ambulatory  Blood pressure (!) 136/95, pulse 63, temperature 97.9 F (36.6 C), temperature source Tympanic, resp. rate 17, height 5\' 7"  (1.702 m), weight 158 lb 8 oz (71.9 kg), SpO2 100 %.  LABORATORY DATA: Lab Results  Component Value Date   WBC 8.7 09/30/2020   HGB 12.5 09/30/2020   HCT 37.8 09/30/2020   MCV 93.3 09/30/2020   PLT 197 09/30/2020      Chemistry      Component Value Date/Time   NA 145 09/30/2020 1337   K 3.6 09/30/2020 1337   CL 104 09/30/2020 1337   CO2 29 09/30/2020 1337   BUN 18 09/30/2020 1337   CREATININE 1.18 (H) 09/30/2020 1337      Component Value Date/Time   CALCIUM 9.8 09/30/2020 1337   ALKPHOS 76 09/30/2020 1337   AST 18 09/30/2020 1337   ALT 12 09/30/2020 1337   BILITOT 1.0 09/30/2020 1337       RADIOGRAPHIC STUDIES: CT Chest W Contrast  Result Date: 10/01/2020 CLINICAL DATA:  Non-small cell lung cancer staging, status post  right upper lobectomy EXAM: CT CHEST WITH CONTRAST TECHNIQUE: Multidetector CT imaging of the chest was performed during intravenous contrast administration. CONTRAST:  73mL OMNIPAQUE IOHEXOL 300 MG/ML  SOLN COMPARISON:  03/17/2020, 09/14/2019 FINDINGS: Cardiovascular: Aortic atherosclerosis. Unchanged aneurysm of the included upper abdominal aorta, measuring up to 3.4 x 3.2 cm (series 3, 444). Cardiomegaly. Left coronary artery calcifications. No pericardial effusion. Mediastinum/Nodes: No enlarged mediastinal, hilar, or axillary lymph nodes. Thyroid gland, trachea, and esophagus demonstrate no significant findings. Lungs/Pleura: Redemonstrated postoperative findings status post right upper lobectomy. Slight interval reduction in size of a predominantly flattened and bandlike treated nodule of the paramedian left upper lobe, measuring approximately 2.6 x 1.9 cm (series 8, image 55). Multiple redemonstrated bilateral mixed solid and ground-glass opacity throughout the lungs. A lesion of the left pulmonary apex measures 1.6 x 1.4 cm, slightly enlarged compared to prior examination, at which time it  measured 1.3 x 1.3 cm when measured similarly and with an increased solid character (series 8, image 27). Other nodules and opacities are unchanged, for example a a predominantly solid nodule of the medial superior segment right lower lobe measuring 1.9 x 1.1 cm (series 8, image 42). No pleural effusion or pneumothorax. Upper Abdomen: No acute abnormality. Musculoskeletal: No chest wall mass or suspicious bone lesions identified. IMPRESSION: 1. Redemonstrated postoperative findings status post right upper lobectomy. 2. Slight interval reduction in size of a predominantly flattened and bandlike treated nodule of the paramedian left upper lobe, likely reflecting of a lesion of radiation fibrosis. 3. Multiple redemonstrated bilateral mixed solid and ground-glass opacities throughout the lungs. A lesion of the left pulmonary  apex measures 1.6 x 1.4 cm, slightly enlarged compared to prior examination, at which time it measured 1.3 x 1.3 cm when measured similarly and with an increased solid character. This nodule, particularly with interval change, is suspicious for indolent adenocarcinoma. This could be characterized for metabolic activity by FDG PET. 4. Other nodules and opacities are unchanged, and several remain morphologically suspicious for indolent multifocal adenocarcinoma. 5. Cardiomegaly and coronary artery disease. 6. Redemonstrated aneurysm of the included upper abdominal aorta measuring 3.4 x 3.2 cm. Consider dedicated angiogram of the abdominal aorta further evaluate. This recommendation follows ACR consensus guidelines: White Paper of the ACR Incidental Findings Committee II on Vascular Findings. J Am Coll Radiol 2013; 10:789-794. Aortic Atherosclerosis (ICD10-I70.0). Electronically Signed   By: Eddie Candle M.D.   On: 10/01/2020 11:29   NM PET Image Restag (PS) Skull Base To Thigh  Result Date: 10/14/2020 CLINICAL DATA:  Subsequent treatment strategy for non-small cell lung cancer. EXAM: NUCLEAR MEDICINE PET SKULL BASE TO THIGH TECHNIQUE: 8.4 mCi F-18 FDG was injected intravenously. Full-ring PET imaging was performed from the skull base to thigh after the radiotracer. CT data was obtained and used for attenuation correction and anatomic localization. Fasting blood glucose: 90 mg/dl COMPARISON:  None. FINDINGS: Mediastinal blood pool activity: SUV max 2.3 Liver activity: SUV max 2.9 NECK: No hypermetabolic lymph nodes in the neck. Incidental CT findings: none CHEST: LEFT upper lobe pulmonary nodular consolidation measuring 1.7 x 1.4 cm increased from 1.3 x 1.3 cm on CT 9 03/2020. This lesion has high metabolic activity with SUV max equal 8.7. Second focus metabolic activity in the LEFT upper lobe just superior to the hilum adjacent to the transverse arch with SUV max equal 6.3. Lesion difficult to define on the CT  portion exam or the accompanying comparison diagnostic CT scan 09/30/2020. Favor hypermetabolic suprahilar lymph node. There is focal activity in the RIGHT hilum at the resection margin with SUV max equal 5.8 (image 63) which corresponds to a nodule along the fissure at the level of the azygos vein measuring 1.4 cm with SUV max equal 5.8. Nodular hypermetabolic activity at this location on comparison PET-CT scan 2019 however the size and metabolic activity have increased. (SUV max equal 3.3 ) Incidental CT findings: none ABDOMEN/PELVIS: No abnormal hypermetabolic activity within the liver, pancreas, adrenal glands, or spleen. No hypermetabolic lymph nodes in the abdomen or pelvis. Incidental CT findings: none SKELETON: No focal hypermetabolic activity to suggest skeletal metastasis. Incidental CT findings: none IMPRESSION: 1. Enlarging hypermetabolic LEFT upper lobe pulmonary nodule consistent bronchogenic carcinoma. 2. Hypermetabolic focus in the LEFT suprahilar lung is favored a hypermetabolic hilar lymph node. 3. Hypermetabolic nodule in the RIGHT lower lobe along the fissure adjacent to the mediastinum is concerning for residual/recurrence lung carcinoma.  4. No evidence distant metastatic disease outside of the chest. Electronically Signed   By: Suzy Bouchard M.D.   On: 10/14/2020 10:05    ASSESSMENT AND PLAN: This is a very pleasant 80 years old African-American female with recurrent non-small cell lung cancer presented with right upper lobe lung nodule that was previously treated with a stereotactic radiotherapy. The patient is currently on observation and she is feeling fine today with no concerning complaints except for mild shortness of breath. She had repeat CT scan of the chest performed recently.  I personally and independently reviewed the scan images and discussed the result and showed the images to the patient today. Her scan showed a stable disease except for slightly enlarged left pulmonary  apex lesion which currently measures 1.6 x 1.4 cm suspicious for indolent adenocarcinoma and a PET scan was recommended for further evaluation. She had a PET scan that showed enlarging hypermetabolic left upper lobe pulmonary nodule consistent with bronchogenic carcinoma in addition to hypermetabolic focus in the left suprahilar lung as well as right lower lobe hypermetabolic nodule concerning for recurrence/residual disease. I discussed the scan results with the patient and showed her the images. I recommended for the patient to see Dr. Roxan Hockey for consideration of bronchoscopy with endobronchial ultrasound and biopsy.  I will also order MRI of the brain to rule out brain metastasis. The patient will come back for follow-up visit in 1 months for evaluation or sooner if the tissue diagnosis confirmed disease recurrence. She was advised to call immediately if she has any concerning symptoms in the interval. The patient voices understanding of current disease status and treatment options and is in agreement with the current care plan. All questions were answered. The patient knows to call the clinic with any problems, questions or concerns. We can certainly see the patient much sooner if necessary.   Disclaimer: This note was dictated with voice recognition software. Similar sounding words can inadvertently be transcribed and may not be corrected upon review.

## 2020-10-17 NOTE — Patient Instructions (Signed)
Steps to Quit Smoking Smoking tobacco is the leading cause of preventable death. It can affect almost every organ in the body. Smoking puts you and people around you at risk for many serious, long-lasting (chronic) diseases. Quitting smoking can be hard, but it is one of the best things that you can do for your health. It is never too late to quit. How do I get ready to quit? When you decide to quit smoking, make a plan to help you succeed. Before you quit:  Pick a date to quit. Set a date within the next 2 weeks to give you time to prepare.  Write down the reasons why you are quitting. Keep this list in places where you will see it often.  Tell your family, friends, and co-workers that you are quitting. Their support is important.  Talk with your doctor about the choices that may help you quit.  Find out if your health insurance will pay for these treatments.  Know the people, places, things, and activities that make you want to smoke (triggers). Avoid them. What first steps can I take to quit smoking?  Throw away all cigarettes at home, at work, and in your car.  Throw away the things that you use when you smoke, such as ashtrays and lighters.  Clean your car. Make sure to empty the ashtray.  Clean your home, including curtains and carpets. What can I do to help me quit smoking? Talk with your doctor about taking medicines and seeing a counselor at the same time. You are more likely to succeed when you do both.  If you are pregnant or breastfeeding, talk with your doctor about counseling or other ways to quit smoking. Do not take medicine to help you quit smoking unless your doctor tells you to do so. To quit smoking: Quit right away  Quit smoking totally, instead of slowly cutting back on how much you smoke over a period of time.  Go to counseling. You are more likely to quit if you go to counseling sessions regularly. Take medicine You may take medicines to help you quit. Some  medicines need a prescription, and some you can buy over-the-counter. Some medicines may contain a drug called nicotine to replace the nicotine in cigarettes. Medicines may:  Help you to stop having the desire to smoke (cravings).  Help to stop the problems that come when you stop smoking (withdrawal symptoms). Your doctor may ask you to use:  Nicotine patches, gum, or lozenges.  Nicotine inhalers or sprays.  Non-nicotine medicine that is taken by mouth. Find resources Find resources and other ways to help you quit smoking and remain smoke-free after you quit. These resources are most helpful when you use them often. They include:  Online chats with a counselor.  Phone quitlines.  Printed self-help materials.  Support groups or group counseling.  Text messaging programs.  Mobile phone apps. Use apps on your mobile phone or tablet that can help you stick to your quit plan. There are many free apps for mobile phones and tablets as well as websites. Examples include Quit Guide from the CDC and smokefree.gov   What things can I do to make it easier to quit?  Talk to your family and friends. Ask them to support and encourage you.  Call a phone quitline (1-800-QUIT-NOW), reach out to support groups, or work with a counselor.  Ask people who smoke to not smoke around you.  Avoid places that make you want to smoke,   such as: ? Bars. ? Parties. ? Smoke-break areas at work.  Spend time with people who do not smoke.  Lower the stress in your life. Stress can make you want to smoke. Try these things to help your stress: ? Getting regular exercise. ? Doing deep-breathing exercises. ? Doing yoga. ? Meditating. ? Doing a body scan. To do this, close your eyes, focus on one area of your body at a time from head to toe. Notice which parts of your body are tense. Try to relax the muscles in those areas.   How will I feel when I quit smoking? Day 1 to 3 weeks Within the first 24 hours,  you may start to have some problems that come from quitting tobacco. These problems are very bad 2-3 days after you quit, but they do not often last for more than 2-3 weeks. You may get these symptoms:  Mood swings.  Feeling restless, nervous, angry, or annoyed.  Trouble concentrating.  Dizziness.  Strong desire for high-sugar foods and nicotine.  Weight gain.  Trouble pooping (constipation).  Feeling like you may vomit (nausea).  Coughing or a sore throat.  Changes in how the medicines that you take for other issues work in your body.  Depression.  Trouble sleeping (insomnia). Week 3 and afterward After the first 2-3 weeks of quitting, you may start to notice more positive results, such as:  Better sense of smell and taste.  Less coughing and sore throat.  Slower heart rate.  Lower blood pressure.  Clearer skin.  Better breathing.  Fewer sick days. Quitting smoking can be hard. Do not give up if you fail the first time. Some people need to try a few times before they succeed. Do your best to stick to your quit plan, and talk with your doctor if you have any questions or concerns. Summary  Smoking tobacco is the leading cause of preventable death. Quitting smoking can be hard, but it is one of the best things that you can do for your health.  When you decide to quit smoking, make a plan to help you succeed.  Quit smoking right away, not slowly over a period of time.  When you start quitting, seek help from your doctor, family, or friends. This information is not intended to replace advice given to you by your health care provider. Make sure you discuss any questions you have with your health care provider. Document Revised: 03/20/2019 Document Reviewed: 09/13/2018 Elsevier Patient Education  2021 Elsevier Inc.  

## 2020-10-17 NOTE — Progress Notes (Signed)
I spoke with Heather [name] today.  She is doing well without complaints.  Per Dr. Julien Nordmann patient needs to be set up with Dr. Roxan Hockey for lung bx. Re-referral completed and I updated his office.

## 2020-10-18 ENCOUNTER — Telehealth: Payer: Self-pay | Admitting: Internal Medicine

## 2020-10-18 NOTE — Telephone Encounter (Signed)
Scheduled per los. Called and left msg. Mailed printout  °

## 2020-10-25 ENCOUNTER — Encounter: Payer: Self-pay | Admitting: Thoracic Surgery (Cardiothoracic Vascular Surgery)

## 2020-10-25 ENCOUNTER — Other Ambulatory Visit: Payer: Self-pay

## 2020-10-25 ENCOUNTER — Institutional Professional Consult (permissible substitution) (INDEPENDENT_AMBULATORY_CARE_PROVIDER_SITE_OTHER): Payer: Medicare Other | Admitting: Thoracic Surgery (Cardiothoracic Vascular Surgery)

## 2020-10-25 ENCOUNTER — Encounter: Payer: Self-pay | Admitting: *Deleted

## 2020-10-25 ENCOUNTER — Other Ambulatory Visit: Payer: Self-pay | Admitting: *Deleted

## 2020-10-25 VITALS — BP 144/91 | HR 60 | Temp 97.3°F | Resp 20 | Ht 67.0 in | Wt 158.0 lb

## 2020-10-25 DIAGNOSIS — R918 Other nonspecific abnormal finding of lung field: Secondary | ICD-10-CM | POA: Diagnosis not present

## 2020-10-25 NOTE — Progress Notes (Signed)
[address]       [place] [address]             [phone]    HPI: Heather [name] is sent for consultation regarding bilateral lung nodules.  Heather [name] is an 80 year old woman with a history of a right upper lobectomy in 2013 for a stage Ib non-small cell carcinoma.  She had a second adenocarcinoma in the left upper lobe treated with stereotactic radiation in 2019.  She has been followed by Dr. Julien Nordmann.  She recently presented for a follow-up visit.  A CT showed an enlarged left apical nodule measuring 1.6 x 1.4 cm primarily groundglass lesion in the right lower lobe superior segment.  PET/CT showed both areas were hypermetabolic.  She now sent for consideration for biopsy.  She denies any chest pain, pressure, or tightness.  She has not had any issues with shortness of breath.  She is not a great historian.  Says her appetite varies.  She has not had any significant weight loss.  No orthopnea or peripheral edema.  Past Medical History:  Diagnosis Date  . Arthritis    reports her joints are fine and she has no pain   . Ascending aortic aneurysm ([place]) 07/20/2014   4.1 cm by CT  . Atrial fib/flutter, transient    post-op  . CHF (congestive heart failure) ([place])    a. EF 30-35% by echo in 2013 --> similar results by repeat imaging in 2016 and 11/2017.  Marland Kitchen Constipation    corrects with diet  . Floater, vitreous    Right  . Hypertension   . Nonsquamous nonsmall cell neoplasm of lung ([place]) 09/2011   Stage IB- s/p right upper lobectomy  . Pneumonia   . Psoriasis    Ankles  . Tobacco abuse     Current Outpatient Medications  Medication Sig Dispense Refill  . alendronate (FOSAMAX) 70 MG tablet Take 70 mg by mouth every Monday.   3  . Calcium Carb-Cholecalciferol (OYSTER SHELL CALCIUM W/D) 500-200 MG-UNIT TABS Take 1 tablet by mouth 3 (three) times daily.    Marland Kitchen glucosamine-chondroitin 500-400 MG tablet Take 1 tablet by mouth daily.    . hydrochlorothiazide  (MICROZIDE) 12.5 MG capsule Take 12.5 mg by mouth daily at 2 PM.  3  . hydrocortisone (ANUSOL-HC) 2.5 % rectal cream Place 1 application rectally 2 (two) times daily. 30 g 1  . meclizine (ANTIVERT) 12.5 MG tablet Take 12.5 mg by mouth daily at 12 noon.    . metoprolol succinate (TOPROL XL) 25 MG 24 hr tablet Take 1 tablet (25 mg total) by mouth 2 (two) times daily. 60 tablet 6  . Multiple Vitamins-Iron (MULTIVITAMINS WITH IRON) TABS Take 1 tablet by mouth daily.    . Omega-3 Fatty Acids (FISH OIL) 500 MG CAPS Take 500 mg by mouth daily.     . vitamin B-12 (CYANOCOBALAMIN) 500 MCG tablet Take 500 mcg by mouth daily.     No current facility-administered medications for this visit.    Physical Exam BP (!) 144/91 (BP Location: Right Arm, Patient Position: Sitting, Cuff Size: Normal)   Pulse 60   Temp (!) 97.3 F (36.3 C) (Skin)   Resp 20   Ht 5\' 7"  (1.702 m)   Wt 158 lb (71.7 kg)   SpO2 97% Comment: RA  BMI 24.22 kg/m  80 year old woman in no acute distress Alert and oriented x3 with no focal deficits No cervical or supraclavicular adenopathy  Lungs diminished bilaterally, no rales or wheezing Cardiac regular rate and rhythm, no murmur No peripheral edema  Diagnostic Tests: NUCLEAR MEDICINE PET SKULL BASE TO THIGH  TECHNIQUE: 8.4 mCi F-18 FDG was injected intravenously. Full-ring PET imaging was performed from the skull base to thigh after the radiotracer. CT data was obtained and used for attenuation correction and anatomic localization.  Fasting blood glucose: 90 mg/dl  COMPARISON:  None.  FINDINGS: Mediastinal blood pool activity: SUV max 2.3  Liver activity: SUV max 2.9  NECK: No hypermetabolic lymph nodes in the neck.  Incidental CT findings: none  CHEST: LEFT upper lobe pulmonary nodular consolidation measuring 1.7 x 1.4 cm increased from 1.3 x 1.3 cm on CT 9 03/2020. This lesion has high metabolic activity with SUV max equal 8.7.  Second focus  metabolic activity in the LEFT upper lobe just superior to the hilum adjacent to the transverse arch with SUV max equal 6.3. Lesion difficult to define on the CT portion exam or the accompanying comparison diagnostic CT scan 09/30/2020. Favor hypermetabolic suprahilar lymph node.  There is focal activity in the RIGHT hilum at the resection margin with SUV max equal 5.8 (image 63) which corresponds to a nodule along the fissure at the level of the azygos vein measuring 1.4 cm with SUV max equal 5.8. Nodular hypermetabolic activity at this location on comparison PET-CT scan 2019 however the size and metabolic activity have increased. (SUV max equal 3.3 )  Incidental CT findings: none  ABDOMEN/PELVIS: No abnormal hypermetabolic activity within the liver, pancreas, adrenal glands, or spleen. No hypermetabolic lymph nodes in the abdomen or pelvis.  Incidental CT findings: none  SKELETON: No focal hypermetabolic activity to suggest skeletal metastasis.  Incidental CT findings: none  IMPRESSION: 1. Enlarging hypermetabolic LEFT upper lobe pulmonary nodule consistent bronchogenic carcinoma. 2. Hypermetabolic focus in the LEFT suprahilar lung is favored a hypermetabolic hilar lymph node. 3. Hypermetabolic nodule in the RIGHT lower lobe along the fissure adjacent to the mediastinum is concerning for residual/recurrence lung carcinoma. 4. No evidence distant metastatic disease outside of the chest.   Electronically Signed   By: Suzy Bouchard M.D.   On: 10/14/2020 10:05 I personally reviewed the CT and PET/CT images.  There is a primarily groundglass opacity in the superior segment of the right lower lobe that is mildly hypermetabolic.  There is an enlarging left upper lobe apical nodule that is markedly hypermetabolic.  Hypermetabolic lesion more centrally in the left lung.  Impression: Heather [name] is an 80 year old woman with a past medical history significant for a  stage Ib adenocarcinoma of the right upper lobe resected in 2013, stage Ia lesion of the left upper lobe treated with stereotactic radiation in 2019, ongoing tobacco abuse, COPD, CHF, postoperative atrial fibrillation, 4.1 cm ascending aneurysm, and hypertension.  She has been followed for multiple groundglass opacities in the lung.  Recently she has had progression of the left apical nodule to 1.6 x 1.4 cm.  This lesion is markedly hypermetabolic on exam.  It is most likely a primary bronchogenic carcinoma.  There is a separate focus of metabolic activity in the left upper lobe more centrally.  This may be a suprahilar node.  This does not appear to be amenable to endobronchial ultrasound.  There are multiple other groundglass lesions including a relatively dense one in the superior segment of the right lower lobe, which is also hypermetabolic.  This probably represents a second (or fourth in her case) primary site.  Surgical resection  is not an option in her case.  She may be a candidate for stereotactic radiation or more systemic therapy, either chemotherapy or targeted therapy.  She needs a biopsy to guide therapy.  I think the best option in her case would be a navigational bronchoscopy.  This would require general anesthesia, but would allow [place] to sample both sides.  It may be difficult to approach the lesion in the superior segment of the right lower lobe because of the angle.  We should be able to get a good angle on the left apical nodule.  I described the proposed procedure to her.  She understands this is an endoscopic procedure that would be done under general anesthesia on an outpatient basis.  I informed her of the indications, risks, benefits, and alternatives.  She understands the risks include those associated with general anesthesia.  She understands the risk include but not limited to death, MI, DVT, PE, bleeding, pneumothorax, failure to make a diagnosis, as well as possibility of other  unforeseeable complications.  She accepts the risk and agrees to proceed  Plan: Navigational bronchoscopy on 11/17/2020  [name], MD [facility] [phone]

## 2020-10-25 NOTE — H&P (View-Only) (Signed)
[address]       [place] [address]             [phone]    HPI: Heather [name] is sent for consultation regarding bilateral lung nodules.  Heather [name] is an 80 year old woman with a history of a right upper lobectomy in 2013 for a stage Ib non-small cell carcinoma.  She had a second adenocarcinoma in the left upper lobe treated with stereotactic radiation in 2019.  She has been followed by Dr. Julien Nordmann.  She recently presented for a follow-up visit.  A CT showed an enlarged left apical nodule measuring 1.6 x 1.4 cm primarily groundglass lesion in the right lower lobe superior segment.  PET/CT showed both areas were hypermetabolic.  She now sent for consideration for biopsy.  She denies any chest pain, pressure, or tightness.  She has not had any issues with shortness of breath.  She is not a great historian.  Says her appetite varies.  She has not had any significant weight loss.  No orthopnea or peripheral edema.  Past Medical History:  Diagnosis Date  . Arthritis    reports her joints are fine and she has no pain   . Ascending aortic aneurysm ([place]) 07/20/2014   4.1 cm by CT  . Atrial fib/flutter, transient    post-op  . CHF (congestive heart failure) ([place])    a. EF 30-35% by echo in 2013 --> similar results by repeat imaging in 2016 and 11/2017.  Marland Kitchen Constipation    corrects with diet  . Floater, vitreous    Right  . Hypertension   . Nonsquamous nonsmall cell neoplasm of lung ([place]) 09/2011   Stage IB- s/p right upper lobectomy  . Pneumonia   . Psoriasis    Ankles  . Tobacco abuse     Current Outpatient Medications  Medication Sig Dispense Refill  . alendronate (FOSAMAX) 70 MG tablet Take 70 mg by mouth every Monday.   3  . Calcium Carb-Cholecalciferol (OYSTER SHELL CALCIUM W/D) 500-200 MG-UNIT TABS Take 1 tablet by mouth 3 (three) times daily.    Marland Kitchen glucosamine-chondroitin 500-400 MG tablet Take 1 tablet by mouth daily.    . hydrochlorothiazide  (MICROZIDE) 12.5 MG capsule Take 12.5 mg by mouth daily at 2 PM.  3  . hydrocortisone (ANUSOL-HC) 2.5 % rectal cream Place 1 application rectally 2 (two) times daily. 30 g 1  . meclizine (ANTIVERT) 12.5 MG tablet Take 12.5 mg by mouth daily at 12 noon.    . metoprolol succinate (TOPROL XL) 25 MG 24 hr tablet Take 1 tablet (25 mg total) by mouth 2 (two) times daily. 60 tablet 6  . Multiple Vitamins-Iron (MULTIVITAMINS WITH IRON) TABS Take 1 tablet by mouth daily.    . Omega-3 Fatty Acids (FISH OIL) 500 MG CAPS Take 500 mg by mouth daily.     . vitamin B-12 (CYANOCOBALAMIN) 500 MCG tablet Take 500 mcg by mouth daily.     No current facility-administered medications for this visit.    Physical Exam BP (!) 144/91 (BP Location: Right Arm, Patient Position: Sitting, Cuff Size: Normal)   Pulse 60   Temp (!) 97.3 F (36.3 C) (Skin)   Resp 20   Ht 5\' 7"  (1.702 m)   Wt 158 lb (71.7 kg)   SpO2 97% Comment: RA  BMI 24.25 kg/m  80 year old woman in no acute distress Alert and oriented x3 with no focal deficits No cervical or supraclavicular adenopathy  Lungs diminished bilaterally, no rales or wheezing Cardiac regular rate and rhythm, no murmur No peripheral edema  Diagnostic Tests: NUCLEAR MEDICINE PET SKULL BASE TO THIGH  TECHNIQUE: 8.4 mCi F-18 FDG was injected intravenously. Full-ring PET imaging was performed from the skull base to thigh after the radiotracer. CT data was obtained and used for attenuation correction and anatomic localization.  Fasting blood glucose: 90 mg/dl  COMPARISON:  None.  FINDINGS: Mediastinal blood pool activity: SUV max 2.3  Liver activity: SUV max 2.9  NECK: No hypermetabolic lymph nodes in the neck.  Incidental CT findings: none  CHEST: LEFT upper lobe pulmonary nodular consolidation measuring 1.7 x 1.4 cm increased from 1.3 x 1.3 cm on CT 9 03/2020. This lesion has high metabolic activity with SUV max equal 8.7.  Second focus  metabolic activity in the LEFT upper lobe just superior to the hilum adjacent to the transverse arch with SUV max equal 6.3. Lesion difficult to define on the CT portion exam or the accompanying comparison diagnostic CT scan 09/30/2020. Favor hypermetabolic suprahilar lymph node.  There is focal activity in the RIGHT hilum at the resection margin with SUV max equal 5.8 (image 63) which corresponds to a nodule along the fissure at the level of the azygos vein measuring 1.4 cm with SUV max equal 5.8. Nodular hypermetabolic activity at this location on comparison PET-CT scan 2019 however the size and metabolic activity have increased. (SUV max equal 3.3 )  Incidental CT findings: none  ABDOMEN/PELVIS: No abnormal hypermetabolic activity within the liver, pancreas, adrenal glands, or spleen. No hypermetabolic lymph nodes in the abdomen or pelvis.  Incidental CT findings: none  SKELETON: No focal hypermetabolic activity to suggest skeletal metastasis.  Incidental CT findings: none  IMPRESSION: 1. Enlarging hypermetabolic LEFT upper lobe pulmonary nodule consistent bronchogenic carcinoma. 2. Hypermetabolic focus in the LEFT suprahilar lung is favored a hypermetabolic hilar lymph node. 3. Hypermetabolic nodule in the RIGHT lower lobe along the fissure adjacent to the mediastinum is concerning for residual/recurrence lung carcinoma. 4. No evidence distant metastatic disease outside of the chest.   Electronically Signed   By: Suzy Bouchard M.D.   On: 10/14/2020 10:05 I personally reviewed the CT and PET/CT images.  There is a primarily groundglass opacity in the superior segment of the right lower lobe that is mildly hypermetabolic.  There is an enlarging left upper lobe apical nodule that is markedly hypermetabolic.  Hypermetabolic lesion more centrally in the left lung.  Impression: Heather [name] is an 80 year old woman with a past medical history significant for a  stage Ib adenocarcinoma of the right upper lobe resected in 2013, stage Ia lesion of the left upper lobe treated with stereotactic radiation in 2019, ongoing tobacco abuse, COPD, CHF, postoperative atrial fibrillation, 4.1 cm ascending aneurysm, and hypertension.  She has been followed for multiple groundglass opacities in the lung.  Recently she has had progression of the left apical nodule to 1.6 x 1.4 cm.  This lesion is markedly hypermetabolic on exam.  It is most likely a primary bronchogenic carcinoma.  There is a separate focus of metabolic activity in the left upper lobe more centrally.  This may be a suprahilar node.  This does not appear to be amenable to endobronchial ultrasound.  There are multiple other groundglass lesions including a relatively dense one in the superior segment of the right lower lobe, which is also hypermetabolic.  This probably represents a second (or fourth in her case) primary site.  Surgical resection  is not an option in her case.  She may be a candidate for stereotactic radiation or more systemic therapy, either chemotherapy or targeted therapy.  She needs a biopsy to guide therapy.  I think the best option in her case would be a navigational bronchoscopy.  This would require general anesthesia, but would allow [place] to sample both sides.  It may be difficult to approach the lesion in the superior segment of the right lower lobe because of the angle.  We should be able to get a good angle on the left apical nodule.  I described the proposed procedure to her.  She understands this is an endoscopic procedure that would be done under general anesthesia on an outpatient basis.  I informed her of the indications, risks, benefits, and alternatives.  She understands the risks include those associated with general anesthesia.  She understands the risk include but not limited to death, MI, DVT, PE, bleeding, pneumothorax, failure to make a diagnosis, as well as possibility of other  unforeseeable complications.  She accepts the risk and agrees to proceed  Plan: Navigational bronchoscopy on 11/17/2020  [name], MD [facility] [phone]

## 2020-10-26 ENCOUNTER — Ambulatory Visit (HOSPITAL_COMMUNITY)
Admission: RE | Admit: 2020-10-26 | Discharge: 2020-10-26 | Disposition: A | Discharge status: Home / Self Care | Payer: Medicare Other | Source: Ambulatory Visit | Attending: Internal Medicine | Admitting: Internal Medicine

## 2020-10-26 DIAGNOSIS — C3490 Malignant neoplasm of unspecified part of unspecified bronchus or lung: Secondary | ICD-10-CM | POA: Insufficient documentation

## 2020-10-26 MED ORDER — GADOBUTROL 1 MMOL/ML IV SOLN
7.0000 mL | Freq: Once | INTRAVENOUS | Status: AC | PRN
  Administered 2020-10-26: 7 mL via INTRAVENOUS

## 2020-11-09 DIAGNOSIS — I10 Essential (primary) hypertension: Secondary | ICD-10-CM | POA: Diagnosis not present

## 2020-11-09 DIAGNOSIS — J449 Chronic obstructive pulmonary disease, unspecified: Secondary | ICD-10-CM | POA: Diagnosis not present

## 2020-11-10 ENCOUNTER — Encounter: Payer: Self-pay | Admitting: Gastroenterology

## 2020-11-10 ENCOUNTER — Ambulatory Visit (INDEPENDENT_AMBULATORY_CARE_PROVIDER_SITE_OTHER): Payer: Medicare Other | Admitting: Gastroenterology

## 2020-11-10 ENCOUNTER — Other Ambulatory Visit: Payer: Self-pay

## 2020-11-10 VITALS — BP 150/87 | HR 57 | Temp 96.8°F | Ht 67.0 in | Wt 162.8 lb

## 2020-11-10 DIAGNOSIS — Z86010 Personal history of colonic polyps: Secondary | ICD-10-CM | POA: Diagnosis not present

## 2020-11-10 NOTE — Patient Instructions (Signed)
I will see you back in September 2022 to discuss colonoscopy!   Please call if any concerns in the meantime!  I enjoyed seeing you again today! As you know, I value our relationship and want to provide genuine, compassionate, and quality care. I welcome your feedback. If you receive a survey regarding your visit,  I greatly appreciate you taking time to fill this out. See you next time!  Annitta Needs, PhD, ANP-BC [facility]

## 2020-11-10 NOTE — Progress Notes (Signed)
Referring Provider: Rosita Fire, MD Primary Care Physician:  Rosita Fire, MD Primary GI: Dr. Abbey Chatters   Chief Complaint  Patient presents with  . Hemorrhoids    HPI:   Heather [name] is an 80 y.o. female presenting today with a history of multiple adenomas in 2018, surveillance in 3-5 years if benefits outweigh the risks, chronic constipation, symptomatic prolapsing hemorrhoids. Thus far, she has had banding of right posterior, left lateral X 2, and neutrally. She has external hemorrhoid tags.   Aware of skin tags. No pressure or pain. No bleeding. No internal hemorrhoids prolapsing. No constipation as long as drinks coffee. No abdominal pain. Desires colonoscopy but is actually undergoing evaluation for lung mass with bronchoscopy in a few weeks. Does not appear to have metastatic disease.   Past Medical History:  Diagnosis Date  . Arthritis    reports her joints are fine and she has no pain   . Ascending aortic aneurysm ([place]) 07/20/2014   4.1 cm by CT  . Atrial fib/flutter, transient    post-op  . CHF (congestive heart failure) ([place])    a. EF 30-35% by echo in 2013 --> similar results by repeat imaging in 2016 and 11/2017.  Marland Kitchen Constipation    corrects with diet  . Floater, vitreous    Right  . Hypertension   . Nonsquamous nonsmall cell neoplasm of lung ([place]) 09/2011   Stage IB- s/p right upper lobectomy  . Pneumonia   . Psoriasis    Ankles  . Tobacco abuse     Past Surgical History:  Procedure Laterality Date  . CATARACT EXTRACTION Bilateral 2016  . COLONOSCOPY  01/22/2012   Procedure: COLONOSCOPY;  Surgeon: Danie Binder, MD;  Location: [facility];  Service: Endoscopy;  Laterality: N/A;  10:45  . COLONOSCOPY N/A 04/15/2017   Four 4-6 mm polyps in descending colon, ascending, and cecum. One 15 mm polyp in proximal ascending colon, one 4 mm poylp at hepatic flexure, two 3-4 mm polyps in sigmoid and descending. Internal hemorrhoids. Colonoscopy in 3-5 years if  benefits outweigh risks. Simple adenomas.   . IR RADIOLOGIST EVAL & MGMT  03/04/2018  . POLYPECTOMY  04/15/2017   Procedure: POLYPECTOMY;  Surgeon: Danie Binder, MD;  Location: [facility];  Service: Endoscopy;;  ascending colon  . RADIOLOGY WITH ANESTHESIA N/A 03/19/2018   Procedure: CT MICROWAVE THERMAL ABLATION WITH ANESTHESIA;  Surgeon: Jacqulynn Cadet, MD;  Location: [facility];  Service: Anesthesiology;  Laterality: N/A;  . right VATS (upper lobectomy)  08/29/11   Dr Roxan Hockey  . VIDEO BRONCHOSCOPY WITH ENDOBRONCHIAL NAVIGATION N/A 03/18/2015   Procedure: VIDEO BRONCHOSCOPY WITH ENDOBRONCHIAL NAVIGATION;  Surgeon: [name], MD;  Location: [facility];  Service: Thoracic;  Laterality: N/A;    Current Outpatient Medications  Medication Sig Dispense Refill  . alendronate (FOSAMAX) 70 MG tablet Take 70 mg by mouth every Monday.   3  . Calcium Carb-Cholecalciferol (OYSTER SHELL CALCIUM W/D) 500-200 MG-UNIT TABS Take 1 tablet by mouth 3 (three) times daily.    Marland Kitchen glucosamine-chondroitin 500-400 MG tablet Take 1 tablet by mouth daily.    . hydrochlorothiazide (MICROZIDE) 12.5 MG capsule Take 12.5 mg by mouth daily at 2 PM.  3  . hydrocortisone (ANUSOL-HC) 2.5 % rectal cream Place 1 application rectally 2 (two) times daily. 30 g 1  . meclizine (ANTIVERT) 12.5 MG tablet Take 12.5 mg by mouth daily at 12 noon.    . metoprolol succinate (TOPROL XL) 25  MG 24 hr tablet Take 1 tablet (25 mg total) by mouth 2 (two) times daily. 60 tablet 6  . Multiple Vitamins-Iron (MULTIVITAMINS WITH IRON) TABS Take 1 tablet by mouth daily.    . Omega-3 Fatty Acids (FISH OIL) 500 MG CAPS Take 500 mg by mouth daily.     . vitamin B-12 (CYANOCOBALAMIN) 500 MCG tablet Take 500 mcg by mouth daily.     No current facility-administered medications for this visit.    Allergies as of 11/10/2020 - Review Complete 11/10/2020  Allergen Reaction Noted  . Ibuprofen Nausea And Vomiting 01/07/2012  . Powder Rash  03/20/2017    Family History  Problem Relation Age of Onset  . Hypertension Mother   . Heart disease Mother   . Heart disease Sister   . Heart disease Brother   . Colon cancer Neg Hx     Social History   Socioeconomic History  . Marital status: Widowed    Spouse name: Not on file  . Number of children: 3  . Years of education: Not on file  . Highest education level: Not on file  Occupational History  . Occupation: retired  Tobacco Use  . Smoking status: Current Some Day Smoker    Packs/day: 0.30    Years: 20.00    Pack years: 6.00    Types: Cigarettes    Last attempt to quit: 01/12/2014    Years since quitting: 6.8  . Smokeless tobacco: Never Used  . Tobacco comment: "I smoke about 1 cigarette a day on average"  Vaping Use  . Vaping Use: Never used  Substance and Sexual Activity  . Alcohol use: No  . Drug use: No  . Sexual activity: Not on file  Other Topics Concern  . Not on file  Social History Narrative   Lives w/ cousin   Social Determinants of Health   Financial Resource Strain: Not on file  Food Insecurity: Not on file  Transportation Needs: Not on file  Physical Activity: Not on file  Stress: Not on file  Social Connections: Not on file    Review of Systems: Gen: Denies fever, chills, anorexia. Denies fatigue, weakness, weight loss.  CV: Denies chest pain, palpitations, syncope, peripheral edema, and claudication. Resp: Denies dyspnea at rest, cough, wheezing, coughing up blood, and pleurisy. GI: see HPI Derm: Denies rash, itching, dry skin Psych: Denies depression, anxiety, memory loss, confusion. No homicidal or suicidal ideation.  Heme: Denies bruising, bleeding, and enlarged lymph nodes.  Physical Exam: BP (!) 150/87   Pulse (!) 57   Temp (!) 96.8 F (36 C) (Temporal)   Ht 5\' 7"  (1.702 m)   Wt 162 lb 12.8 oz (73.8 kg)   BMI 25.50 kg/m  General:   Alert and oriented. No distress noted. Pleasant and cooperative.  Head:  Normocephalic and  atraumatic. Eyes:  Conjuctiva clear without scleral icterus. Mouth:  Mask in place Abdomen:  +BS, soft, non-tender and non-distended. No rebound or guarding. No HSM or masses noted. Msk:  Symmetrical without gross deformities. Normal posture. Extremities:  Without edema. Neurologic:  Alert and  oriented x4 Psych:  Alert and cooperative. Normal mood and affect.  ASSESSMENT/PLAN: Heather [name] is an 80 y.o. female presenting today with a history of multiple adenomas in 2018, surveillance in 3-5 years if benefits outweigh the risks, chronic constipation, symptomatic prolapsing hemorrhoids. Thus far, she has had banding of right posterior, left lateral X 2, and neutrally. She has external hemorrhoid tags that she is  aware are not amenable to banding or any intervention from [place] here.   She would like to pursue a colonoscopy in the future, but she is undergoing bronchoscopy for lung mass in several weeks. We will see her back in August/September to ensure she is clinically appropriate to undergo this. She has no other GI concerns today. Constipation well-managed with diet/  Annitta Needs, PhD, ANP-BC [facility]

## 2020-11-13 NOTE — Progress Notes (Signed)
Heather [name]  Heather Fire, MD [address] [place] [address]  DIAGNOSIS:  1) Stage IAnon-small cell lung, adenocarcinoma diagnosed in March 2013. 2) non-small cell lung cancer, adenocarcinoma in January 2019 2) She now has recurrent disease in the left upper lobe suspicious for stage IA.She also has some other small pulmonary nodules that could be low-grade adenocarcinoma. Biopsy of left upper lobe lesion scheduled for 11/17/20  PRIOR THERAPY: 1) status post right upper lobectomy with lymph node dissection on September 24, 2011 2) status post stereotactic radiotherapy to the left upper lobe lung nodule in February 2019.   CURRENT THERAPY: Obsertation.   INTERVAL HISTORY: Heather [name] 80 y.o. female returns to clinic today for a follow-up visit.  The patient was last seen in the clinic on 10/17/2020 by Dr. Julien Nordmann.  At that point in time, recent restaging CT scan showed slight enlarged left pulmonary apex lesion which may represent an indolent adenocarcinoma.  He then arrange for a PET scan to be performed which showed enlarging hypermetabolic left upper lobe pulmonary nodule consistent with bronchogenic carcinoma in addition to hypermetabolic focus in the left suprahilar lung as well as right lower lobe hypermetabolic nodule concerning for recurrence/residual disease.  Therefore, he recommended that the patient see cardiothoracic surgeon, Dr. Roxan Hockey, for consideration of bronchoscopy with endobronchial ultrasound and biopsy.  The patient saw Dr. Roxan Hockey on 10/25/2020. Dr. Leonarda Salon [name] mentioned she had evidence of progression of the left apical nodule to 1.6 x 1.4 cm which is most likely a primary bronchogenic carcinoma.  He mentions there is a separate focus of metabolic activity in the left upper lobe more centrally that may be a suprahilar node.  This does not appear to be amenable to endobronchial ultrasound.  There are  multiple other groundglass lesions including a relatively dense one in the superior segment of the right lower lobe, which is also hypermetabolic. His [name] discusses that this probably represents a second (or fourth in her case) primary site.  Dr. Koleen Nimrod noted that surgical resection is not an option in her case but she may be candidate for SBRT or systemic therapy.  However, she needs a biopsy to guide therapy.  Therefore, he is considering a navigational bronchoscopy which is planned for 11/17/2020.  He discussed that it may be difficult to approach the lesion in the superior segment of the right lower lobe due to the angle but the left apical nodule is likely amenable to biopsy.  Otherwise, the patient denies any new concerning complaints today but is upset that her blood pressure is elevated. She states she checks it at home and it is not this high. She attributes this to being stressed about her transportation for all of her doctors appointments.  She denies any fever, chills, night sweats, or weight loss.  She denies any shortness of breath, cough, hemoptysis, or chest pain.  She denies any nausea, vomiting, diarrhea, or constipation.  She denies any headache or visual changes.  She recently had a restaging brain MRI performed on 10/26/20 which is negative for metastatic disease to the brain.  The patient is here today for evaluation and more detailed discussion about her current condition and recommended treatment options.  MEDICAL HISTORY: Past Medical History:  Diagnosis Date  . Arthritis    reports her joints are fine and she has no pain   . Ascending aortic aneurysm ([place]) 07/20/2014   4.1 cm by CT  . Atrial fib/flutter, transient  post-op  . CHF (congestive heart failure) ([place])    a. EF 30-35% by echo in 2013 --> similar results by repeat imaging in 2016 and 11/2017.  Marland Kitchen Constipation    corrects with diet  . Dysrhythmia   . Floater, vitreous    Right  . Hypertension   . Nonsquamous  nonsmall cell neoplasm of lung ([place]) 09/2011   Stage IB- s/p right upper lobectomy  . Pneumonia   . Psoriasis    Ankles  . Tobacco abuse     ALLERGIES:  is allergic to ibuprofen and powder.  MEDICATIONS:  Current Outpatient Medications  Medication Sig Dispense Refill  . alendronate (FOSAMAX) 70 MG tablet Take 70 mg by mouth every Monday.   3  . Calcium Carb-Cholecalciferol (OYSTER SHELL CALCIUM W/D) 500-200 MG-UNIT TABS Take 1 tablet by mouth 3 (three) times daily.    Marland Kitchen glucosamine-chondroitin 500-400 MG tablet Take 1 tablet by mouth daily.    . hydrochlorothiazide (MICROZIDE) 12.5 MG capsule Take 12.5 mg by mouth daily at 2 PM.  3  . hydrocortisone (ANUSOL-HC) 2.5 % rectal cream Place 1 application rectally 2 (two) times daily. 30 g 1  . meclizine (ANTIVERT) 12.5 MG tablet Take 12.5 mg by mouth daily at 12 noon.    . metoprolol succinate (TOPROL XL) 25 MG 24 hr tablet Take 1 tablet (25 mg total) by mouth 2 (two) times daily. 60 tablet 6  . Multiple Vitamins-Iron (MULTIVITAMINS WITH IRON) TABS Take 1 tablet by mouth daily.    . Omega-3 Fatty Acids (FISH OIL) 500 MG CAPS Take 500 mg by mouth daily.     . vitamin B-12 (CYANOCOBALAMIN) 500 MCG tablet Take 500 mcg by mouth daily.     No current facility-administered medications for this visit.    SURGICAL HISTORY:  Past Surgical History:  Procedure Laterality Date  . CATARACT EXTRACTION Bilateral 2016  . COLONOSCOPY  01/22/2012   Procedure: COLONOSCOPY;  Surgeon: Danie Binder, MD;  Location: [facility];  Service: Endoscopy;  Laterality: N/A;  10:45  . COLONOSCOPY N/A 04/15/2017   Four 4-6 mm polyps in descending colon, ascending, and cecum. One 15 mm polyp in proximal ascending colon, one 4 mm poylp at hepatic flexure, two 3-4 mm polyps in sigmoid and descending. Internal hemorrhoids. Colonoscopy in 3-5 years if benefits outweigh risks. Simple adenomas.   . IR RADIOLOGIST EVAL & MGMT  03/04/2018  . POLYPECTOMY  04/15/2017    Procedure: POLYPECTOMY;  Surgeon: Danie Binder, MD;  Location: [facility];  Service: Endoscopy;;  ascending colon  . RADIOLOGY WITH ANESTHESIA N/A 03/19/2018   Procedure: CT MICROWAVE THERMAL ABLATION WITH ANESTHESIA;  Surgeon: Jacqulynn Cadet, MD;  Location: [facility];  Service: Anesthesiology;  Laterality: N/A;  . right VATS (upper lobectomy)  08/29/11   Dr Roxan Hockey  . VIDEO BRONCHOSCOPY WITH ENDOBRONCHIAL NAVIGATION N/A 03/18/2015   Procedure: VIDEO BRONCHOSCOPY WITH ENDOBRONCHIAL NAVIGATION;  Surgeon: [name], MD;  Location: [facility];  Service: Thoracic;  Laterality: N/A;    REVIEW OF SYSTEMS:   Review of Systems  Constitutional: Negative for appetite change, chills, fatigue, fever and unexpected weight change.  HENT: Negative for mouth sores, nosebleeds, sore throat and trouble swallowing.   Eyes: Negative for eye problems and icterus.  Respiratory: Negative for cough, hemoptysis, shortness of breath and wheezing.   Cardiovascular: Negative for chest pain and leg swelling.  Gastrointestinal: Negative for abdominal pain, constipation, diarrhea, nausea and vomiting.  Genitourinary: Negative for bladder incontinence, difficulty  urinating, dysuria, frequency and hematuria.   Musculoskeletal: Negative for back pain, gait problem, neck pain and neck stiffness.  Skin: Negative for itching and rash.  Neurological: Negative for dizziness, extremity weakness, gait problem, headaches, light-headedness and seizures.  Hematological: Negative for adenopathy. Does not bruise/bleed easily.  Psychiatric/Behavioral: Negative for confusion, depression and sleep disturbance. The patient is not nervous/anxious.     PHYSICAL EXAMINATION:  Blood pressure (!) 148/101, pulse 64, temperature 98.2 F (36.8 C), temperature source Tympanic, resp. rate 18, weight 158 lb 6.4 oz (71.8 kg), SpO2 99 %.  ECOG PERFORMANCE STATUS: 1 - Symptomatic but completely ambulatory  Physical Exam   Constitutional: Oriented to person, place, and time and thin appearing female and in no distress.  HENT:  Head: Normocephalic and atraumatic.  Mouth/Throat: Oropharynx is clear and moist. No oropharyngeal exudate.  Eyes: Conjunctivae are normal. Right eye exhibits no discharge. Left eye exhibits no discharge. No scleral icterus.  Neck: Normal range of motion. Neck supple.  Cardiovascular: Normal rate, regular rhythm, normal heart sounds and intact distal pulses.   Pulmonary/Chest: Effort normal and breath sounds normal. No respiratory distress. No wheezes. No rales.  Abdominal: Soft. Bowel sounds are normal. Exhibits no distension and no mass. There is no tenderness.  Musculoskeletal: Normal range of motion. Exhibits no edema.  Lymphadenopathy:    No cervical adenopathy.  Neurological: Alert and oriented to person, place, and time. Exhibits normal muscle tone. Gait normal. Coordination normal.  Skin: Skin is warm and dry. No rash noted. Not diaphoretic. No erythema. No pallor.  Psychiatric: Mood, memory and judgment normal.  Vitals reviewed.  LABORATORY DATA: Lab Results  Component Value Date   WBC 8.7 11/16/2020   HGB 12.3 11/16/2020   HCT 37.1 11/16/2020   MCV 93.9 11/16/2020   PLT 176 11/16/2020      Chemistry      Component Value Date/Time   NA 144 11/16/2020 1337   K 3.7 11/16/2020 1337   CL 106 11/16/2020 1337   CO2 30 11/16/2020 1337   BUN 17 11/16/2020 1337   CREATININE 1.16 (H) 11/16/2020 1337      Component Value Date/Time   CALCIUM 10.2 11/16/2020 1337   ALKPHOS 71 11/16/2020 1337   AST 22 11/16/2020 1337   ALT 11 11/16/2020 1337   BILITOT 0.7 11/16/2020 1337       RADIOGRAPHIC STUDIES:  MR BRAIN W WO CONTRAST  Result Date: 10/27/2020 CLINICAL DATA:  Non-small cell lung cancer staging EXAM: MRI HEAD WITHOUT AND WITH CONTRAST TECHNIQUE: Multiplanar, multiecho pulse sequences of the brain and surrounding structures were obtained without and with  intravenous contrast. CONTRAST:  5mL GADAVIST GADOBUTROL 1 MMOL/ML IV SOLN COMPARISON:  None. FINDINGS: Brain: No swelling or enhancement to suggest metastatic disease. Few remote white matter insults, age congruent. Normal brain volume. No incidental infarct, hydrocephalus, or collection. Vascular: Normal flow voids and vascular enhancements Skull and upper cervical spine: No detected bony metastasis. Sinuses/Orbits: Negative IMPRESSION: No evidence of metastatic disease. Electronically Signed   By: Monte Fantasia M.D.   On: 10/27/2020 07:44     ASSESSMENT/PLAN:  This is a very pleasant 80 year old African-American female diagnosed originally with non-small cell lung cancer, adenocarcinoma in the right upper lobe in March 2013.  She also was diagnosed with non-small cell lung cancer, adenocarcinoma on the left upper lobe status post stereotactic radiotherapy to this lesion in 2019.  The patient has been on observation until a recent restaging CT scan performed showed a slightly enlarged  left pulmonary apex lesion measuring 1.6 x 1.4 cm which is suspicious for an indolent adenocarcinoma.  She had a PET scan performed which showed an enlarging hypermetabolic left upper lobe pulmonary nodule consistent with bronchogenic carcinoma in addition to a hypermetabolic focus in the left suprahilar lung as well as the right lower lobe hypermetabolic nodule concerning for recurrent/residual disease.  The patient was seen by Dr. Roxan Hockey who is planning on performing a bronchoscopy and biopsy tomorrow of the left apical lung lesion. Per Dr. Roxan Hockey, she is not a surgical candidate for resection.   The patient was seen with Dr. Julien Nordmann today.  Dr. Julien Nordmann a lengthy discussion with the patient about her current condition and recommended treatment options.  Dr. Julien Nordmann recommends proceeding with the biopsy as scheduled tomorrow. If this is positive for malignancy, then we will place a referral to radiation  oncology for consideration of SBRT to these lesions. If the biopsy is negative, then we will continue on observation with a restaging CT scan of the chest in a few months. She is in agreement with the plan.   I will personally call the patient next week regardless to let her know what arrangements I am making. If we refer her to radiation for SBRT, I will also arrange a restaging CT scan of the chest in 4 months and an office visit a few days later.  The patient was advised to call immediately if she has any concerning symptoms in the interval. The patient voices understanding of current disease status and treatment options and is in agreement with the current care plan. All questions were answered. The patient knows to call the clinic with any problems, questions or concerns. We can certainly see the patient much sooner if necessary      No orders of the defined types were placed in this encounter.   Erilyn Pearman L Sehaj Mcenroe, PA-C 11/16/20  ADDENDUM: Hematology/Oncology Attending: I had a face-to-face encounter with the patient today.  I reviewed her records and recommended her care plan.  This is a very pleasant 80 years old African-American female with past medical history significant for stage Ia non-small cell lung cancer, adenocarcinoma in March 2013 status post right upper lobectomy with lymph node dissection.  She had new disease recurrence in the left upper lobe suspicious for stage Ia status post SBRT in February 2019.  The patient was found on recent imaging studies including CT scan of the chest as well as PET scan to have enlarging and hypermetabolic left upper lobe nodule suspicious for recurrent disease.  The patient was referred to Dr. Roxan Hockey and expected to have bronchoscopy tomorrow for confirmation of tissue diagnosis. Dr. Roxan Hockey told the patient that she is not a surgical candidate for resection.  We will refer the patient to radiation oncology for consideration of  SBRT to the new nodules. We will see her back for follow-up visit in 4 months for evaluation and repeat CT scan of the chest at that time if she has any further evidence for disease progression, she would be considered for systemic therapy. The patient is in agreement with the current plan. She was advised to call immediately if she has any concerning symptoms in the interval. The total time spent in the appointment was 20 minutes. Disclaimer: This [name] was dictated with voice recognition software. Similar sounding words can inadvertently be transcribed and may be missed upon review. Eilleen Kempf, MD 11/16/20

## 2020-11-14 NOTE — Progress Notes (Signed)
Surgical Instructions    Your procedure is scheduled on 11/17/20.  Report to [facility] Main Entrance "A" at 11:15 A.M., then check in with the Admitting office.  Call this number if you have problems the morning of surgery:  [phone]   If you have any questions prior to your surgery date call [phone]: Open Monday-Friday 8am-4pm    Remember:  Do not eat or drink after midnight the night before your surgery    Take these medicines the morning of surgery with A SIP OF WATER  metoprolol succinate (TOPROL XL)   As of today, STOP taking any Aspirin (unless otherwise instructed by your surgeon) Aleve, Naproxen, Ibuprofen, Motrin, Advil, Goody's, BC's, all herbal medications, fish oil, and all vitamins.                     Do not wear jewelry, make up, or nail polish            Do not wear lotions, powders, perfumes, or deodorant.            Do not shave 48 hours prior to surgery.              Do not bring valuables to the hospital.            [facility] is not responsible for any belongings or valuables.  Do NOT Smoke (Tobacco/Vaping) or drink Alcohol 24 hours prior to your procedure If you use a CPAP at night, you may bring all equipment for your overnight stay.   Contacts, glasses, dentures or bridgework may not be worn into surgery, please bring cases for these belongings   For patients admitted to the hospital, discharge time will be determined by your treatment team.   Patients discharged the day of surgery will not be allowed to drive home, and someone needs to stay with them for 24 hours.    Special instructions:   [place]- Preparing For Surgery  Before surgery, you can play an important role. Because skin is not sterile, your skin needs to be as free of germs as possible. You can reduce the number of germs on your skin by washing with CHG (chlorahexidine gluconate) Soap before surgery.  CHG is an antiseptic cleaner which kills germs and bonds with the skin to  continue killing germs even after washing.    Oral Hygiene is also important to reduce your risk of infection.  Remember - BRUSH YOUR TEETH THE MORNING OF SURGERY WITH YOUR REGULAR TOOTHPASTE  Please do not use if you have an allergy to CHG or antibacterial soaps. If your skin becomes reddened/irritated stop using the CHG.  Do not shave (including legs and underarms) for at least 48 hours prior to first CHG shower. It is OK to shave your face.  Please follow these instructions carefully.   1. Shower the NIGHT BEFORE SURGERY and the MORNING OF SURGERY  2. If you chose to wash your hair, wash your hair first as usual with your normal shampoo.  3. After you shampoo, rinse your hair and body thoroughly to remove the shampoo.  4. Wash Face and genitals (private parts) with your normal soap.   5.  Shower the NIGHT BEFORE SURGERY and the MORNING OF SURGERY with CHG Soap.   6. Use CHG Soap as you would any other liquid soap. You can apply CHG directly to the skin and wash gently with a scrungie or a clean washcloth.   7. Apply the CHG  Soap to your body ONLY FROM THE NECK DOWN.  Do not use on open wounds or open sores. Avoid contact with your eyes, ears, mouth and genitals (private parts). Wash Face and genitals (private parts)  with your normal soap.   8. Wash thoroughly, paying special attention to the area where your surgery will be performed.  9. Thoroughly rinse your body with warm water from the neck down.  10. DO NOT shower/wash with your normal soap after using and rinsing off the CHG Soap.  11. Pat yourself dry with a CLEAN TOWEL.  12. Wear CLEAN PAJAMAS to bed the night before surgery  13. Place CLEAN SHEETS on your bed the night before your surgery  14. DO NOT SLEEP WITH PETS.   Day of Surgery: Take a shower with CHG soap. Wear Clean/Comfortable clothing the morning of surgery Do not apply any deodorants/lotions.   Remember to brush your teeth WITH YOUR REGULAR  TOOTHPASTE.   Please read over the following fact sheets that you were given.

## 2020-11-15 ENCOUNTER — Other Ambulatory Visit: Payer: Self-pay

## 2020-11-15 ENCOUNTER — Other Ambulatory Visit (HOSPITAL_COMMUNITY)
Admission: RE | Admit: 2020-11-15 | Discharge: 2020-11-15 | Disposition: A | Discharge status: Home / Self Care | Payer: Medicare Other | Source: Ambulatory Visit | Attending: Thoracic Surgery (Cardiothoracic Vascular Surgery) | Admitting: Thoracic Surgery (Cardiothoracic Vascular Surgery)

## 2020-11-15 ENCOUNTER — Ambulatory Visit (HOSPITAL_COMMUNITY)
Admission: RE | Admit: 2020-11-15 | Discharge: 2020-11-15 | Disposition: A | Discharge status: Home / Self Care | Payer: Medicare Other | Source: Ambulatory Visit | Attending: Thoracic Surgery (Cardiothoracic Vascular Surgery) | Admitting: Thoracic Surgery (Cardiothoracic Vascular Surgery)

## 2020-11-15 ENCOUNTER — Encounter (HOSPITAL_COMMUNITY): Payer: Self-pay

## 2020-11-15 ENCOUNTER — Encounter (HOSPITAL_COMMUNITY)
Admission: RE | Admit: 2020-11-15 | Discharge: 2020-11-15 | Disposition: A | Discharge status: Home / Self Care | Payer: Medicare Other | Source: Ambulatory Visit | Attending: Thoracic Surgery (Cardiothoracic Vascular Surgery) | Admitting: Thoracic Surgery (Cardiothoracic Vascular Surgery)

## 2020-11-15 DIAGNOSIS — R918 Other nonspecific abnormal finding of lung field: Secondary | ICD-10-CM | POA: Insufficient documentation

## 2020-11-15 DIAGNOSIS — I509 Heart failure, unspecified: Secondary | ICD-10-CM | POA: Diagnosis not present

## 2020-11-15 DIAGNOSIS — Z79899 Other long term (current) drug therapy: Secondary | ICD-10-CM | POA: Insufficient documentation

## 2020-11-15 DIAGNOSIS — Z7901 Long term (current) use of anticoagulants: Secondary | ICD-10-CM | POA: Insufficient documentation

## 2020-11-15 DIAGNOSIS — F17210 Nicotine dependence, cigarettes, uncomplicated: Secondary | ICD-10-CM | POA: Insufficient documentation

## 2020-11-15 DIAGNOSIS — I5042 Chronic combined systolic (congestive) and diastolic (congestive) heart failure: Secondary | ICD-10-CM | POA: Insufficient documentation

## 2020-11-15 DIAGNOSIS — Z20822 Contact with and (suspected) exposure to covid-19: Secondary | ICD-10-CM | POA: Insufficient documentation

## 2020-11-15 DIAGNOSIS — H43391 Other vitreous opacities, right eye: Secondary | ICD-10-CM | POA: Insufficient documentation

## 2020-11-15 DIAGNOSIS — C3412 Malignant neoplasm of upper lobe, left bronchus or lung: Secondary | ICD-10-CM | POA: Insufficient documentation

## 2020-11-15 DIAGNOSIS — I110 Hypertensive heart disease with heart failure: Secondary | ICD-10-CM | POA: Insufficient documentation

## 2020-11-15 DIAGNOSIS — Z01818 Encounter for other preprocedural examination: Secondary | ICD-10-CM | POA: Insufficient documentation

## 2020-11-15 DIAGNOSIS — I4891 Unspecified atrial fibrillation: Secondary | ICD-10-CM | POA: Insufficient documentation

## 2020-11-15 DIAGNOSIS — C3490 Malignant neoplasm of unspecified part of unspecified bronchus or lung: Secondary | ICD-10-CM | POA: Diagnosis not present

## 2020-11-15 DIAGNOSIS — I4892 Unspecified atrial flutter: Secondary | ICD-10-CM | POA: Insufficient documentation

## 2020-11-15 HISTORY — DX: Cardiac arrhythmia, unspecified: I49.9

## 2020-11-15 LAB — COMPREHENSIVE METABOLIC PANEL
ALT: 17 U/L (ref 0–44)
AST: 24 U/L (ref 15–41)
Albumin: 4 g/dL (ref 3.5–5.0)
Alkaline Phosphatase: 67 U/L (ref 38–126)
Anion gap: 6 (ref 5–15)
BUN: 13 mg/dL (ref 8–23)
CO2: 30 mmol/L (ref 22–32)
Calcium: 10.1 mg/dL (ref 8.9–10.3)
Chloride: 104 mmol/L (ref 98–111)
Creatinine, Ser: 1.09 mg/dL — ABNORMAL HIGH (ref 0.44–1.00)
GFR, Estimated: 51 mL/min — ABNORMAL LOW (ref >60)
Glucose, Bld: 105 mg/dL — ABNORMAL HIGH (ref 70–99)
Potassium: 3.3 mmol/L — ABNORMAL LOW (ref 3.5–5.1)
Sodium: 140 mmol/L (ref 135–145)
Total Bilirubin: 1.1 mg/dL (ref 0.3–1.2)
Total Protein: 7.2 g/dL (ref 6.5–8.1)

## 2020-11-15 LAB — SURGICAL PCR SCREEN
MRSA, PCR: POSITIVE — AB
Staphylococcus aureus: POSITIVE — AB

## 2020-11-15 LAB — CBC
HCT: 41.3 % (ref 36.0–46.0)
Hemoglobin: 13.4 g/dL (ref 12.0–15.0)
MCH: 30.9 pg (ref 26.0–34.0)
MCHC: 32.4 g/dL (ref 30.0–36.0)
MCV: 95.2 fL (ref 80.0–100.0)
Platelets: 189 10*3/uL (ref 150–400)
RBC: 4.34 MIL/uL (ref 3.87–5.11)
RDW: 14.1 % (ref 11.5–15.5)
WBC: 7.4 10*3/uL (ref 4.0–10.5)
nRBC: 0 % (ref 0.0–0.2)

## 2020-11-15 LAB — PROTIME-INR
INR: 1.2 (ref 0.8–1.2)
Prothrombin Time: 15.1 seconds (ref 11.4–15.2)

## 2020-11-15 LAB — APTT: aPTT: 31 seconds (ref 24–36)

## 2020-11-15 LAB — SARS CORONAVIRUS 2 (TAT 6-24 HRS): SARS Coronavirus 2: NEGATIVE

## 2020-11-15 NOTE — Progress Notes (Signed)
Notified ryan brooks of abnormal MRSA swab result.

## 2020-11-15 NOTE — Progress Notes (Signed)
PCP - Rosita Fire Cardiologist - jonathan branch Oncology: Dr. Julien Nordmann  PPM/ICD - denies   Chest x-ray - n/a EKG - 11/15/20 Stress Test - 09/22/18 ECHO - 05/07/19 Cardiac Cath - denies  Sleep Study - denies   No diabetes  Patient instructed to hold all Aspirin, NSAID's, herbal medications, fish oil and vitamins 7 days prior to surgery.   ERAS Protcol -no  COVID TEST- 11/15/20   Anesthesia review: yes, cardiology clearance?  Patient denies shortness of breath, fever, cough and chest pain at PAT appointment   All instructions explained to the patient, with a verbal understanding of the material. Patient agrees to go over the instructions while at home for a better understanding. Patient also instructed to self quarantine after being tested for COVID-19. The opportunity to ask questions was provided.

## 2020-11-16 ENCOUNTER — Encounter: Payer: Self-pay | Admitting: Physician Assistant

## 2020-11-16 ENCOUNTER — Inpatient Hospital Stay: Payer: Medicare Other | Attending: Internal Medicine

## 2020-11-16 ENCOUNTER — Inpatient Hospital Stay (HOSPITAL_BASED_OUTPATIENT_CLINIC_OR_DEPARTMENT_OTHER): Payer: Medicare Other | Admitting: Physician Assistant

## 2020-11-16 VITALS — BP 148/101 | HR 64 | Temp 98.2°F | Resp 18 | Wt 158.4 lb

## 2020-11-16 DIAGNOSIS — Z79899 Other long term (current) drug therapy: Secondary | ICD-10-CM | POA: Insufficient documentation

## 2020-11-16 DIAGNOSIS — Z8719 Personal history of other diseases of the digestive system: Secondary | ICD-10-CM | POA: Diagnosis not present

## 2020-11-16 DIAGNOSIS — Z886 Allergy status to analgesic agent status: Secondary | ICD-10-CM | POA: Diagnosis not present

## 2020-11-16 DIAGNOSIS — R030 Elevated blood-pressure reading, without diagnosis of hypertension: Secondary | ICD-10-CM | POA: Diagnosis not present

## 2020-11-16 DIAGNOSIS — C3412 Malignant neoplasm of upper lobe, left bronchus or lung: Secondary | ICD-10-CM | POA: Diagnosis not present

## 2020-11-16 DIAGNOSIS — C3490 Malignant neoplasm of unspecified part of unspecified bronchus or lung: Secondary | ICD-10-CM

## 2020-11-16 LAB — CMP (CANCER CENTER ONLY)
ALT: 11 U/L (ref 0–44)
AST: 22 U/L (ref 15–41)
Albumin: 3.9 g/dL (ref 3.5–5.0)
Alkaline Phosphatase: 71 U/L (ref 38–126)
Anion gap: 8 (ref 5–15)
BUN: 17 mg/dL (ref 8–23)
CO2: 30 mmol/L (ref 22–32)
Calcium: 10.2 mg/dL (ref 8.9–10.3)
Chloride: 106 mmol/L (ref 98–111)
Creatinine: 1.16 mg/dL — ABNORMAL HIGH (ref 0.44–1.00)
GFR, Estimated: 48 mL/min — ABNORMAL LOW (ref >60)
Glucose, Bld: 96 mg/dL (ref 70–99)
Potassium: 3.7 mmol/L (ref 3.5–5.1)
Sodium: 144 mmol/L (ref 135–145)
Total Bilirubin: 0.7 mg/dL (ref 0.3–1.2)
Total Protein: 7.2 g/dL (ref 6.5–8.1)

## 2020-11-16 LAB — CBC WITH DIFFERENTIAL (CANCER CENTER ONLY)
Abs Immature Granulocytes: 0.02 10*3/uL (ref 0.00–0.07)
Basophils Absolute: 0 10*3/uL (ref 0.0–0.1)
Basophils Relative: 0 %
Eosinophils Absolute: 0.1 10*3/uL (ref 0.0–0.5)
Eosinophils Relative: 2 %
HCT: 37.1 % (ref 36.0–46.0)
Hemoglobin: 12.3 g/dL (ref 12.0–15.0)
Immature Granulocytes: 0 %
Lymphocytes Relative: 46 %
Lymphs Abs: 4 10*3/uL (ref 0.7–4.0)
MCH: 31.1 pg (ref 26.0–34.0)
MCHC: 33.2 g/dL (ref 30.0–36.0)
MCV: 93.9 fL (ref 80.0–100.0)
Monocytes Absolute: 0.5 10*3/uL (ref 0.1–1.0)
Monocytes Relative: 6 %
Neutro Abs: 4 10*3/uL (ref 1.7–7.7)
Neutrophils Relative %: 46 %
Platelet Count: 176 10*3/uL (ref 150–400)
RBC: 3.95 MIL/uL (ref 3.87–5.11)
RDW: 14.1 % (ref 11.5–15.5)
WBC Count: 8.7 10*3/uL (ref 4.0–10.5)
nRBC: 0 % (ref 0.0–0.2)

## 2020-11-16 NOTE — Progress Notes (Signed)
CC'ED TO PCP 

## 2020-11-16 NOTE — Progress Notes (Signed)
Anesthesia Chart Review:  Case: [id number] Date/Time: 11/17/20 1259   Procedure: VIDEO BRONCHOSCOPY WITH ENDOBRONCHIAL NAVIGATION (N/A )   Anesthesia type: General   Pre-op diagnosis: Right Lower Lobe and Left Upper Lobe Nodules   Location: [facility] OR ROOM 10 / [place] [facility]   Surgeons: [name], MD      DISCUSSION: Patient is an 80 year old female scheduled for the above procedure. She has known lung cancer with RU lobectomy in 2013 and stereotactic radiation in 2019 for a 2nd adenocarcinoma (LUL).Per Dr. Roxan Hockey notes, "She has been followed for multiple groundglass opacities in the lung.  Recently she has had progression of the left apical nodule to 1.6 x 1.4 cm.  This lesion is markedly hypermetabolic on exam.  It is most likely a primary bronchogenic carcinoma.Marland KitchenMarland KitchenSurgical resection is not an option in her case.  She may be a candidate for stereotactic radiation or more systemic therapy, either chemotherapy or targeted therapy.  She needs a biopsy to guide therapy."  History includes smoking, lung cancer (s/p RU lobectomy with mediastinal LN dissection 09/24/11 for stage 1b non-small cell lung cancer; 2nd adenocarcinoma LUL s/p stereotactic radiation in 2019), HTN, CHF, afib/flutter (initall post-op 2013), psoriasis, aortic aneurysm (4.1 cm ascending TAA 07/20/14 CT; nonaneurysmal thoracic aorta 03/19/19 CT; upper abdominal aorta aneurysm 3.4x3.2 cm 09/30/20 CT), right eye vitreous floater.  Last cardiology follow-up with Dr. Harl Bowie on 04/27/20 for chronic combined systolic and diastolic CHF. LVEF 30-35% in 11/2017.Nuclear stress test on 09/22/2018 was normal with a normal calculated LVEF of 60% suggesting a nonischemic etiology. Last echo 04/2019 showed EF 45-50%., and Entresto was discontinued.  No recent issues with afib (not on anticoagulation). No SOB/DOE or edema. Compliant with medications. One year follow-up recommended.   11/15/20 presurgical COVID-19 test negative. Anesthesia team to  evaluate on the day of surgery. 11/15/20 CXR is in process.   VS: BP 137/89   Pulse 69   Temp 36.9 C   Resp 18   Ht 5\' 7"  (1.702 m)   Wt 71.8 kg   SpO2 99%   BMI 24.78 kg/m     PROVIDERS: Rosita Fire, MD is PCP  Carlyle Dolly, MD is cardiologist Curt Bears, MD is HEM-ONC Hurshel Keys, DO is GI   LABS: Labs reviewed: Acceptable for surgery. (all labs ordered are listed, but only abnormal results are displayed)  Labs Reviewed  SURGICAL PCR SCREEN - Abnormal; Notable for the following components:      Result Value   MRSA, PCR POSITIVE (*)    Staphylococcus aureus POSITIVE (*)    All other components within normal limits  COMPREHENSIVE METABOLIC PANEL - Abnormal; Notable for the following components:   Potassium 3.3 (*)    Glucose, Bld 105 (*)    Creatinine, Ser 1.09 (*)    GFR, Estimated 51 (*)    All other components within normal limits  CBC  PROTIME-INR  APTT     IMAGES: CXR 11/15/20: In process.  MRI Brain 10/26/20:  IMPRESSION: No evidence of metastatic disease.  PET Scan 10/13/20: IMPRESSION: 1. Enlarging hypermetabolic LEFT upper lobe pulmonary nodule consistent bronchogenic carcinoma. 2. Hypermetabolic focus in the LEFT suprahilar lung is favored a hypermetabolic hilar lymph node. 3. Hypermetabolic nodule in the RIGHT lower lobe along the fissure adjacent to the mediastinum is concerning for residual/recurrence lung carcinoma. 4. No evidence distant metastatic disease outside of the chest.  CT Chest 09/30/20: IMPRESSION: 1. Redemonstrated postoperative findings status post right upper lobectomy. 2. Slight interval  reduction in size of a predominantly flattened and bandlike treated nodule of the paramedian left upper lobe, likely reflecting of a lesion of radiation fibrosis. 3. Multiple redemonstrated bilateral mixed solid and ground-glass opacities throughout the lungs. A lesion of the left pulmonary apex measures 1.6 x 1.4 cm,  slightly enlarged compared to prior examination, at which time it measured 1.3 x 1.3 cm when measured similarly and with an increased solid character. This nodule, particularly with interval change, is suspicious for indolent adenocarcinoma. This could be characterized for metabolic activity by FDG PET. 4. Other nodules and opacities are unchanged, and several remain morphologically suspicious for indolent multifocal adenocarcinoma. 5. Cardiomegaly and coronary artery disease. 6. Redemonstrated aneurysm of the included upper abdominal aorta measuring 3.4 x 3.2 cm. Consider dedicated angiogram of the abdominal aorta further evaluate. This recommendation follows ACR consensus guidelines: White Paper of the ACR Incidental Findings Committee II on Vascular Findings. J Am Coll Radiol 2013; 10:789-794.   EKG: 11/15/20: SR with PACs   CV: Echo 05/07/19: IMPRESSIONS  1. Left ventricular ejection fraction, by visual estimation, is 45 to  50%. The left ventricle has mildly decreased function. There is no left  ventricular hypertrophy.  2. Apical septal segment, apical inferior segment, and apex are abnormal.  3. Left ventricular diastolic parameters are consistent with Grade I  diastolic dysfunction (impaired relaxation).  4. Mildly dilated left ventricular internal cavity size.  5. Global right ventricle has normal systolic function.The right  ventricular size is normal. No increase in right ventricular wall  thickness.  6. Left atrial size was moderately dilated.  7. Right atrial size was normal.  8. Mild aortic valve annular calcification.  9. The mitral valve is myxomatous with mild bileaflet prolapse. Mild  mitral valve regurgitation.  10. The tricuspid valve is grossly normal. Tricuspid valve regurgitation  is trivial.  11. The aortic valve is tricuspid. Aortic valve regurgitation is mild.  Mild aortic valve sclerosis without stenosis.  12. The pulmonic valve was grossly  normal. Pulmonic valve regurgitation is  mild.  13. Normal pulmonary artery systolic pressure.  14. The tricuspid regurgitant velocity is 2.20 m/s, and with an assumed  right atrial pressure of 3 mmHg, the estimated right ventricular systolic  pressure is normal at 22.4 mmHg.  15. The inferior vena cava is normal in size with greater than 50%  respiratory variability, suggesting right atrial pressure of 3 mmHg.  - In comparison to the previous echocardiogram(s): Unable to compare  directly with the images from prior study May 2019.  (Comparison echo 11/21/17: LVEF 30-35%, diffuse hypokinesis, hypokinesis of the basal inferolateral myocardium, grade 1 DD, trivial AI, mild-moderate MR, mild TR/PR; 03/18/15 EF 30-35%, diffuse hypokinesis, mild MR)   Nuclear stress test 09/22/18:  Electrically negative for ischemia  Normal perfusion No ischemia or scar  LVEF 60%  Low risk study    Past Medical History:  Diagnosis Date  . Arthritis    reports her joints are fine and she has no pain   . Ascending aortic aneurysm ([place]) 07/20/2014   4.1 cm by CT  . Atrial fib/flutter, transient    post-op  . CHF (congestive heart failure) ([place])    a. EF 30-35% by echo in 2013 --> similar results by repeat imaging in 2016 and 11/2017.  Marland Kitchen Constipation    corrects with diet  . Dysrhythmia   . Floater, vitreous    Right  . Hypertension   . Nonsquamous nonsmall cell neoplasm of lung ([place]) 09/2011  Stage IB- s/p right upper lobectomy  . Pneumonia   . Psoriasis    Ankles  . Tobacco abuse     Past Surgical History:  Procedure Laterality Date  . CATARACT EXTRACTION Bilateral 2016  . COLONOSCOPY  01/22/2012   Procedure: COLONOSCOPY;  Surgeon: Danie Binder, MD;  Location: [facility];  Service: Endoscopy;  Laterality: N/A;  10:45  . COLONOSCOPY N/A 04/15/2017   Four 4-6 mm polyps in descending colon, ascending, and cecum. One 15 mm polyp in proximal ascending colon, one 4 mm poylp at hepatic flexure,  two 3-4 mm polyps in sigmoid and descending. Internal hemorrhoids. Colonoscopy in 3-5 years if benefits outweigh risks. Simple adenomas.   . IR RADIOLOGIST EVAL & MGMT  03/04/2018  . POLYPECTOMY  04/15/2017   Procedure: POLYPECTOMY;  Surgeon: Danie Binder, MD;  Location: [facility];  Service: Endoscopy;;  ascending colon  . RADIOLOGY WITH ANESTHESIA N/A 03/19/2018   Procedure: CT MICROWAVE THERMAL ABLATION WITH ANESTHESIA;  Surgeon: Jacqulynn Cadet, MD;  Location: [facility]S;  Service: Anesthesiology;  Laterality: N/A;  . right VATS (upper lobectomy)  08/29/11   Dr Roxan Hockey  . VIDEO BRONCHOSCOPY WITH ENDOBRONCHIAL NAVIGATION N/A 03/18/2015   Procedure: VIDEO BRONCHOSCOPY WITH ENDOBRONCHIAL NAVIGATION;  Surgeon: [name], MD;  Location: [place];  Service: Thoracic;  Laterality: N/A;    MEDICATIONS: . alendronate (FOSAMAX) 70 MG tablet  . Calcium Carb-Cholecalciferol (OYSTER SHELL CALCIUM W/D) 500-200 MG-UNIT TABS  . glucosamine-chondroitin 500-400 MG tablet  . hydrochlorothiazide (MICROZIDE) 12.5 MG capsule  . hydrocortisone (ANUSOL-HC) 2.5 % rectal cream  . meclizine (ANTIVERT) 12.5 MG tablet  . metoprolol succinate (TOPROL XL) 25 MG 24 hr tablet  . Multiple Vitamins-Iron (MULTIVITAMINS WITH IRON) TABS  . Omega-3 Fatty Acids (FISH OIL) 500 MG CAPS  . vitamin B-12 (CYANOCOBALAMIN) 500 MCG tablet   No current facility-administered medications for this encounter.    Myra Gianotti, PA-C Surgical Short Stay/Anesthesiology [facility] Phone [phone] [facility] Phone [phone] 11/16/2020 12:20 PM

## 2020-11-16 NOTE — Anesthesia Preprocedure Evaluation (Addendum)
Anesthesia Evaluation  Patient identified by MRN, date of birth, ID band Patient awake    Reviewed: Allergy & Precautions, NPO status , Patient's Chart, lab work & pertinent test results  History of Anesthesia Complications Negative for: history of anesthetic complications  Airway Mallampati: I  TM Distance: >3 FB Neck ROM: Full    Dental  (+) Edentulous Upper, Edentulous Lower, Dental Advisory Given   Pulmonary Current Smoker,  Pulmonary mass   Pulmonary exam normal        Cardiovascular hypertension, Pt. on home beta blockers and Pt. on medications Normal cardiovascular exam  IMPRESSIONS    1. Left ventricular ejection fraction, by visual estimation, is 45 to  50%. The left ventricle has mildly decreased function. There is no left  ventricular hypertrophy.  2. Apical septal segment, apical inferior segment, and apex are abnormal.  3. Left ventricular diastolic parameters are consistent with Grade I  diastolic dysfunction (impaired relaxation).  4. Mildly dilated left ventricular internal cavity size.  5. Global right ventricle has normal systolic function.The right  ventricular size is normal. No increase in right ventricular wall  thickness.  6. Left atrial size was moderately dilated.  7. Right atrial size was normal.  8. Mild aortic valve annular calcification.  9. The mitral valve is myxomatous with mild bileaflet prolapse. Mild  mitral valve regurgitation.  10. The tricuspid valve is grossly normal. Tricuspid valve regurgitation  is trivial.  11. The aortic valve is tricuspid. Aortic valve regurgitation is mild.  Mild aortic valve sclerosis without stenosis.  12. The pulmonic valve was grossly normal. Pulmonic valve regurgitation is  mild.  13. Normal pulmonary artery systolic pressure.  14. The tricuspid regurgitant velocity is 2.20 m/s, and with an assumed  right atrial pressure of 3 mmHg, the  estimated right ventricular systolic  pressure is normal at 22.4 mmHg.  15. The inferior vena cava is normal in size with greater than 50%  respiratory variability, suggesting right atrial pressure of 3 mmHg.   In comparison to the previous echocardiogram(s): Unable to compare  directly with the images from prior study May 2019.  FINDINGS  Left Ventricle: Left ventricular ejection fraction, by visual estimation,  is 45 to 50%. The left ventricle has mildly decreased function. The left  ventricular internal cavity size was mildly dilated left ventricle. There  is no left ventricular  hypertrophy. Left ventricular diastolic parameters are consistent with  Grade I diastolic dysfunction (impaired relaxation).    Neuro/Psych negative neurological ROS     GI/Hepatic negative GI ROS, Neg liver ROS,   Endo/Other  negative endocrine ROS  Renal/GU negative Renal ROS     Musculoskeletal negative musculoskeletal ROS (+)   Abdominal   Peds  Hematology negative hematology ROS (+)   Anesthesia Other Findings   Reproductive/Obstetrics                           Anesthesia Physical Anesthesia Plan  ASA: III  Anesthesia Plan: General   Post-op Pain Management:    Induction: Intravenous  PONV Risk Score and Plan: 3 and Ondansetron, Dexamethasone and Diphenhydramine  Airway Management Planned: Oral ETT  Additional Equipment:   Intra-op Plan:   Post-operative Plan: Extubation in OR  Informed Consent: I have reviewed the patients History and Physical, chart, labs and discussed the procedure including the risks, benefits and alternatives for the proposed anesthesia with the patient or authorized representative who has indicated his/her understanding and acceptance.  Dental advisory given  Plan Discussed with: Anesthesiologist and CRNA  Anesthesia Plan Comments: (PAT note written 11/16/2020 by Myra Gianotti, PA-C. )       Anesthesia Quick  Evaluation

## 2020-11-16 NOTE — Patient Instructions (Signed)
-  Please keep your appointment for the biopsy tomorrow (5/12) as scheduled.  -It will take about a week or so to get the results from the biopsy. If the biopsy confirms that the spot in the lung is cancer, then I will place a referral to the radiation doctors. I will call you to confirm that I placed the referral so you know to expect phone call from their office to set up a consultation. They are in the basement of this building [facility]).  -If we proceed with radiation, then we will also arrange for you to come back and see Dr. Julien Nordmann in 4 months after radiation with a CT scan to be done a few days before. That way, we will have the results to review with you at that appointment.  -If you have any questions, feel free to call our office [phone])

## 2020-11-17 ENCOUNTER — Ambulatory Visit (HOSPITAL_COMMUNITY): Payer: Medicare Other | Admitting: Anesthesiology

## 2020-11-17 ENCOUNTER — Ambulatory Visit (HOSPITAL_COMMUNITY): Payer: Medicare Other | Admitting: Vascular Surgery

## 2020-11-17 ENCOUNTER — Ambulatory Visit (HOSPITAL_COMMUNITY): Payer: Medicare Other

## 2020-11-17 ENCOUNTER — Encounter (HOSPITAL_COMMUNITY)
Admission: RE | Disposition: A | Discharge status: Home / Self Care | Payer: Self-pay | Source: Home / Self Care | Attending: Thoracic Surgery (Cardiothoracic Vascular Surgery)

## 2020-11-17 ENCOUNTER — Ambulatory Visit (HOSPITAL_COMMUNITY)
Admission: RE | Admit: 2020-11-17 | Discharge: 2020-11-17 | Disposition: A | Discharge status: Home / Self Care | Payer: Medicare Other | Attending: Thoracic Surgery (Cardiothoracic Vascular Surgery) | Admitting: Thoracic Surgery (Cardiothoracic Vascular Surgery)

## 2020-11-17 ENCOUNTER — Other Ambulatory Visit: Payer: Self-pay | Admitting: Physician Assistant

## 2020-11-17 DIAGNOSIS — I499 Cardiac arrhythmia, unspecified: Secondary | ICD-10-CM | POA: Diagnosis not present

## 2020-11-17 DIAGNOSIS — R918 Other nonspecific abnormal finding of lung field: Secondary | ICD-10-CM | POA: Diagnosis present

## 2020-11-17 DIAGNOSIS — Z79899 Other long term (current) drug therapy: Secondary | ICD-10-CM | POA: Insufficient documentation

## 2020-11-17 DIAGNOSIS — C3410 Malignant neoplasm of upper lobe, unspecified bronchus or lung: Secondary | ICD-10-CM | POA: Diagnosis not present

## 2020-11-17 DIAGNOSIS — I509 Heart failure, unspecified: Secondary | ICD-10-CM | POA: Insufficient documentation

## 2020-11-17 DIAGNOSIS — Z419 Encounter for procedure for purposes other than remedying health state, unspecified: Secondary | ICD-10-CM

## 2020-11-17 DIAGNOSIS — Z85118 Personal history of other malignant neoplasm of bronchus and lung: Secondary | ICD-10-CM | POA: Diagnosis not present

## 2020-11-17 DIAGNOSIS — Z902 Acquired absence of lung [part of]: Secondary | ICD-10-CM | POA: Insufficient documentation

## 2020-11-17 DIAGNOSIS — C3412 Malignant neoplasm of upper lobe, left bronchus or lung: Secondary | ICD-10-CM | POA: Insufficient documentation

## 2020-11-17 DIAGNOSIS — J449 Chronic obstructive pulmonary disease, unspecified: Secondary | ICD-10-CM | POA: Diagnosis not present

## 2020-11-17 DIAGNOSIS — Z923 Personal history of irradiation: Secondary | ICD-10-CM | POA: Diagnosis not present

## 2020-11-17 DIAGNOSIS — C3432 Malignant neoplasm of lower lobe, left bronchus or lung: Secondary | ICD-10-CM

## 2020-11-17 DIAGNOSIS — I714 Abdominal aortic aneurysm, without rupture: Secondary | ICD-10-CM | POA: Insufficient documentation

## 2020-11-17 DIAGNOSIS — R911 Solitary pulmonary nodule: Secondary | ICD-10-CM

## 2020-11-17 DIAGNOSIS — I110 Hypertensive heart disease with heart failure: Secondary | ICD-10-CM | POA: Diagnosis not present

## 2020-11-17 DIAGNOSIS — C3411 Malignant neoplasm of upper lobe, right bronchus or lung: Secondary | ICD-10-CM | POA: Diagnosis not present

## 2020-11-17 HISTORY — PX: VIDEO BRONCHOSCOPY WITH ENDOBRONCHIAL NAVIGATION: SHX6175

## 2020-11-17 SURGERY — VIDEO BRONCHOSCOPY WITH ENDOBRONCHIAL NAVIGATION
Anesthesia: General

## 2020-11-17 MED ORDER — DEXAMETHASONE SODIUM PHOSPHATE 10 MG/ML IJ SOLN
INTRAMUSCULAR | Status: DC | PRN
  Administered 2020-11-17: 8 mg via INTRAVENOUS

## 2020-11-17 MED ORDER — SUGAMMADEX SODIUM 200 MG/2ML IV SOLN
INTRAVENOUS | Status: DC | PRN
  Administered 2020-11-17: 200 mg via INTRAVENOUS

## 2020-11-17 MED ORDER — PROPOFOL 10 MG/ML IV BOLUS
INTRAVENOUS | Status: DC | PRN
  Administered 2020-11-17: 100 mg via INTRAVENOUS

## 2020-11-17 MED ORDER — FENTANYL CITRATE (PF) 100 MCG/2ML IJ SOLN: 25.0000 ug | INTRAMUSCULAR | Status: DC | PRN

## 2020-11-17 MED ORDER — FENTANYL CITRATE (PF) 250 MCG/5ML IJ SOLN
INTRAMUSCULAR | Status: DC | PRN
  Administered 2020-11-17: 100 ug via INTRAVENOUS
  Administered 2020-11-17: 50 ug via INTRAVENOUS

## 2020-11-17 MED ORDER — FENTANYL CITRATE (PF) 250 MCG/5ML IJ SOLN
INTRAMUSCULAR | Status: AC
  Filled 2020-11-17: qty 5

## 2020-11-17 MED ORDER — ACETAMINOPHEN 500 MG PO TABS
1000.0000 mg | ORAL_TABLET | Freq: Once | ORAL | Status: DC
  Filled 2020-11-17: qty 2

## 2020-11-17 MED ORDER — EPINEPHRINE PF 1 MG/ML IJ SOLN
INTRAMUSCULAR | Status: AC
  Filled 2020-11-17: qty 1

## 2020-11-17 MED ORDER — CHLORHEXIDINE GLUCONATE 0.12 % MT SOLN
15.0000 mL | Freq: Once | OROMUCOSAL | Status: AC
  Administered 2020-11-17: 15 mL via OROMUCOSAL
  Filled 2020-11-17: qty 15

## 2020-11-17 MED ORDER — LIDOCAINE 2% (20 MG/ML) 5 ML SYRINGE
INTRAMUSCULAR | Status: AC
  Filled 2020-11-17: qty 5

## 2020-11-17 MED ORDER — ROCURONIUM BROMIDE 10 MG/ML (PF) SYRINGE
PREFILLED_SYRINGE | INTRAVENOUS | Status: DC | PRN
  Administered 2020-11-17: 20 mg via INTRAVENOUS
  Administered 2020-11-17: 50 mg via INTRAVENOUS

## 2020-11-17 MED ORDER — PHENYLEPHRINE HCL-NACL 10-0.9 MG/250ML-% IV SOLN
INTRAVENOUS | Status: DC | PRN
  Administered 2020-11-17: 25 ug/min via INTRAVENOUS

## 2020-11-17 MED ORDER — DEXAMETHASONE SODIUM PHOSPHATE 10 MG/ML IJ SOLN
INTRAMUSCULAR | Status: AC
  Filled 2020-11-17: qty 1

## 2020-11-17 MED ORDER — ORAL CARE MOUTH RINSE: 15.0000 mL | Freq: Once | OROMUCOSAL | Status: AC

## 2020-11-17 MED ORDER — ONDANSETRON HCL 4 MG/2ML IJ SOLN
INTRAMUSCULAR | Status: AC
  Filled 2020-11-17: qty 2

## 2020-11-17 MED ORDER — 0.9 % SODIUM CHLORIDE (POUR BTL) OPTIME
TOPICAL | Status: DC | PRN
  Administered 2020-11-17: 1000 mL

## 2020-11-17 MED ORDER — ONDANSETRON HCL 4 MG/2ML IJ SOLN
INTRAMUSCULAR | Status: DC | PRN
  Administered 2020-11-17: 4 mg via INTRAVENOUS

## 2020-11-17 MED ORDER — LIDOCAINE 2% (20 MG/ML) 5 ML SYRINGE
INTRAMUSCULAR | Status: DC | PRN
  Administered 2020-11-17: 100 mg via INTRAVENOUS

## 2020-11-17 MED ORDER — LACTATED RINGERS IV SOLN: INTRAVENOUS | Status: DC

## 2020-11-17 MED ORDER — ROCURONIUM BROMIDE 10 MG/ML (PF) SYRINGE
PREFILLED_SYRINGE | INTRAVENOUS | Status: AC
  Filled 2020-11-17: qty 10

## 2020-11-17 MED ORDER — LACTATED RINGERS IV SOLN: INTRAVENOUS | Status: DC | PRN

## 2020-11-17 SURGICAL SUPPLY — 41 items
ADAPTER BRONCHOSCOPE OLYMPUS (ADAPTER) ×2 IMPLANT
ADAPTER VALVE BIOPSY EBUS (MISCELLANEOUS) IMPLANT
ADPTR VALVE BIOPSY EBUS (MISCELLANEOUS)
BLADE CLIPPER SURG (BLADE) ×2 IMPLANT
BRUSH BIOPSY BRONCH 10 SDTNB (MISCELLANEOUS) ×2 IMPLANT
BRUSH SUPERTRAX BIOPSY (INSTRUMENTS) IMPLANT
BRUSH SUPERTRAX NDL-TIP CYTO (INSTRUMENTS) ×4 IMPLANT
CANISTER SUCT 3000ML PPV (MISCELLANEOUS) ×2 IMPLANT
CNTNR URN SCR LID CUP LEK RST (MISCELLANEOUS) ×2 IMPLANT
CONT SPEC 4OZ STRL OR WHT (MISCELLANEOUS) ×2
COVER BACK TABLE 60X90IN (DRAPES) ×2 IMPLANT
FILTER STRAW FLUID ASPIR (MISCELLANEOUS) IMPLANT
FORCEPS BIOP SUPERTRX PREMAR (INSTRUMENTS) IMPLANT
GAUZE SPONGE 4X4 12PLY STRL (GAUZE/BANDAGES/DRESSINGS) ×2 IMPLANT
GLOVE SURG SIGNA 7.5 PF LTX (GLOVE) ×2 IMPLANT
GOWN STRL REUS W/ TWL XL LVL3 (GOWN DISPOSABLE) ×1 IMPLANT
GOWN STRL REUS W/TWL XL LVL3 (GOWN DISPOSABLE) ×1
KIT CLEAN ENDO COMPLIANCE (KITS) ×2 IMPLANT
KIT ILLUMISITE 180 PROCEDURE (KITS) IMPLANT
KIT ILLUMISITE 90 PROCEDURE (KITS) IMPLANT
KIT TURNOVER KIT B (KITS) ×2 IMPLANT
MARKER SKIN DUAL TIP RULER LAB (MISCELLANEOUS) ×2 IMPLANT
NEEDLE SUPERTRX PREMARK BIOPSY (NEEDLE) IMPLANT
NS IRRIG 1000ML POUR BTL (IV SOLUTION) ×2 IMPLANT
OIL SILICONE PENTAX (PARTS (SERVICE/REPAIRS)) ×2 IMPLANT
PAD ARMBOARD 7.5X6 YLW CONV (MISCELLANEOUS) ×4 IMPLANT
PATCHES PATIENT (LABEL) ×6 IMPLANT
SYR 20ML ECCENTRIC (SYRINGE) ×2 IMPLANT
SYR 20ML LL LF (SYRINGE) ×2 IMPLANT
SYR 30ML LL (SYRINGE) ×2 IMPLANT
SYR 5ML LL (SYRINGE) ×2 IMPLANT
SYR TB 1ML LUER SLIP (SYRINGE) IMPLANT
TOWEL GREEN STERILE (TOWEL DISPOSABLE) ×2 IMPLANT
TOWEL GREEN STERILE FF (TOWEL DISPOSABLE) ×2 IMPLANT
TRAP SPECIMEN MUCUS 40CC (MISCELLANEOUS) ×2 IMPLANT
TUBE CONNECTING 20X1/4 (TUBING) ×4 IMPLANT
UNDERPAD 30X36 HEAVY ABSORB (UNDERPADS AND DIAPERS) ×2 IMPLANT
VALVE BIOPSY  SINGLE USE (MISCELLANEOUS) ×1
VALVE BIOPSY SINGLE USE (MISCELLANEOUS) ×1 IMPLANT
VALVE SUCTION BRONCHIO DISP (MISCELLANEOUS) ×2 IMPLANT
WATER STERILE IRR 1000ML POUR (IV SOLUTION) ×2 IMPLANT

## 2020-11-17 NOTE — Brief Op Note (Signed)
11/17/2020  1:14 PM  PATIENT:  Heather [name]  80 y.o. female  PRE-OPERATIVE DIAGNOSIS:  Right Lower Lobe and Left Upper Lobe Nodules  POST-OPERATIVE DIAGNOSIS:  Non-small cell carcinoma left upper lobe  PROCEDURE:  Procedure(s): VIDEO BRONCHOSCOPY WITH ENDOBRONCHIAL NAVIGATION (N/A) with needle aspirations, brushings and transbronchial biopsies of left upper lobe nodule  SURGEON:  Surgeon(s) and Role:    * [name], MD - Primary  PHYSICIAN ASSISTANT:   ASSISTANTS: none   ANESTHESIA:   general  EBL:  20 mL   BLOOD ADMINISTERED:none  DRAINS: none   LOCAL MEDICATIONS USED:  NONE  SPECIMEN:  Source of Specimen:  LUL nodule  DISPOSITION OF SPECIMEN:  PATHOLOGY  COUNTS:  NO endo  TOURNIQUET:  * No tourniquets in log *  DICTATION: .Other Dictation: Dictation Number -  PLAN OF CARE: Discharge to home after PACU  PATIENT DISPOSITION:  PACU - hemodynamically stable.   Delay start of Pharmacological VTE agent (>24hrs) due to surgical blood loss or risk of bleeding: not applicable  FINDINGS: Left upper lobe needle aspirations and brushings + for NSCCA  Unable to access appropriate airway for right lower lobe nodule due to distortion of airways secondary to previous right upper lobectomy

## 2020-11-17 NOTE — Discharge Instructions (Addendum)
Do not drive or engage in heavy physical activity for 24 hours  You may resume normal activities tomorrow  You may cough up small amounts of blood over the next few days.  You may use acetaminophen (Tylenol) if needed for discomfort. You may use an over the counter cough medication if needed.  Call [phone] if you develop chest pain, shortness of breath, fever > 101 F or cough up more than 2 tablespoons of blood  Follow up as scheduled with Dr. Julien Nordmann

## 2020-11-17 NOTE — Transfer of Care (Signed)
Immediate Anesthesia Transfer of Care Note  Patient: Heather [name]  Procedure(s) Performed: VIDEO BRONCHOSCOPY WITH ENDOBRONCHIAL NAVIGATION (N/A )  Patient Location: PACU  Anesthesia Type:General  Level of Consciousness: patient cooperative  Airway & Oxygen Therapy: Patient Spontanous Breathing and Patient connected to face mask oxygen  Post-op Assessment: Report given to RN and Post -op Vital signs reviewed and stable  Post vital signs: Reviewed and stable  Last Vitals:  Vitals Value Taken Time  BP 137/74 11/17/20 1251  Temp    Pulse 66 11/17/20 1252  Resp 17 11/17/20 1252  SpO2 100 % 11/17/20 1252  Vitals shown include unvalidated device data.  Last Pain:  Vitals:   11/17/20 1039  TempSrc: Oral         Complications: No complications documented.

## 2020-11-17 NOTE — Anesthesia Postprocedure Evaluation (Signed)
Anesthesia Post Note  Patient: [name]  Procedure(s) Performed: VIDEO BRONCHOSCOPY WITH ENDOBRONCHIAL NAVIGATION (N/A )     Patient location during evaluation: PACU Anesthesia Type: General Level of consciousness: sedated Pain management: pain level controlled Vital Signs Assessment: post-procedure vital signs reviewed and stable Respiratory status: spontaneous breathing and respiratory function stable Cardiovascular status: stable Postop Assessment: no apparent nausea or vomiting Anesthetic complications: no   No complications documented.  Last Vitals:  Vitals:   11/17/20 1336 11/17/20 1351  BP: (!) 146/77 (!) 148/79  Pulse: (!) 55 (!) 55  Resp: 15 12  Temp:  36.7 C  SpO2: 98% 97%    Last Pain:  Vitals:   11/17/20 1321  TempSrc:   PainSc: 0-No pain                 Tanaysia Bhardwaj DANIEL

## 2020-11-17 NOTE — Anesthesia Procedure Notes (Signed)
Procedure Name: Intubation Date/Time: 11/17/2020 11:41 AM Performed by: Kathryne Hitch, CRNA Pre-anesthesia Checklist: Patient identified, Emergency Drugs available, Suction available and Patient being monitored Patient Re-evaluated:Patient Re-evaluated prior to induction Oxygen Delivery Method: Circle system utilized Preoxygenation: Pre-oxygenation with 100% oxygen Induction Type: IV induction Ventilation: Mask ventilation without difficulty Laryngoscope Size: Miller and 3 Grade View: Grade I Tube type: Oral Tube size: 8.5 mm Number of attempts: 1 Airway Equipment and Method: Stylet and Oral airway Placement Confirmation: ETT inserted through vocal cords under direct vision,  positive ETCO2 and breath sounds checked- equal and bilateral Secured at: 21 cm Tube secured with: Tape Dental Injury: Teeth and Oropharynx as per pre-operative assessment

## 2020-11-17 NOTE — Interval H&P Note (Signed)
History and Physical Interval Note:  11/17/2020 11:10 AM  Heather [name]  has presented today for surgery, with the diagnosis of Right Lower Lobe and Left Upper Lobe Nodules.  The various methods of treatment have been discussed with the patient and family. After consideration of risks, benefits and other options for treatment, the patient has consented to  Procedure(s): [place] (N/A) as a surgical intervention.  The patient's history has been reviewed, patient examined, no change in status, stable for surgery.  I have reviewed the patient's chart and labs.  Questions were answered to the patient's satisfaction.     [name]

## 2020-11-18 ENCOUNTER — Other Ambulatory Visit: Payer: Self-pay | Admitting: Physician Assistant

## 2020-11-18 ENCOUNTER — Encounter (HOSPITAL_COMMUNITY): Payer: Self-pay | Admitting: Thoracic Surgery (Cardiothoracic Vascular Surgery)

## 2020-11-18 DIAGNOSIS — C3412 Malignant neoplasm of upper lobe, left bronchus or lung: Secondary | ICD-10-CM

## 2020-11-18 LAB — CYTOLOGY - NON PAP

## 2020-11-18 NOTE — Op Note (Signed)
NAMEJANAYAH, [name] MEDICAL RECORD NO: [id number] ACCOUNT NO: [id number] DATE OF BIRTH: 25-Nov-1940 FACILITY: [facility] LOCATION: [facility] PHYSICIAN: Revonda Standard. Roxan Hockey, MD  Operative Report   DATE OF PROCEDURE: 11/17/2020  PREOPERATIVE DIAGNOSIS:  Multiple lung nodules with dominant nodules in the left upper lobe and right lower lobe.  POSTOPERATIVE DIAGNOSIS:  Multiple lung nodules with dominant nodules in the left upper lobe and right lower lobe. Non-small cell carcinoma left upper lobe.  PROCEDURE:  Electromagnetic navigational bronchoscopy with needle aspirations, brushings and transbronchial biopsies.  SURGEON:  Revonda Standard. Roxan Hockey, MD   ASSISTANT:  None.  ANESTHESIA:  General.  FINDINGS:  Left upper lobe needle aspirations and brushings were adequate for diagnosis of non-small cell carcinoma.  Previous right upper lobe stump well healed, distortion of the lower lobe airway unable to pass guide into the subsegmental bronchus  leading to superior segmental nodule.  CLINICAL NOTE:  Heather [name] is an 80 year old woman with a history of right upper lobectomy for stage IB non-small cell carcinoma in 2013 and stereotactic radiation for a left upper lobe lesion in 2019.  Recently, a CT showed progression of the left  apical nodule as well as a ground glass lesion in the right lower lobe superior segment.  Multiple other ground glass opacities were stable.  Both areas were hypermetabolic on PET/CT and she was sent for biopsy.  She was advised to undergo navigational  bronchoscopy.  The indications, risks, benefits, and alternatives were discussed in detail with the patient.  She understood and accepted the risks and agreed to proceed.  DESCRIPTION OF PROCEDURE:  Heather [name] was brought to the operating room on 11/17/2020.  She had induction of general anesthesia and was intubated.  Planning for the navigational bronchoscopy was done on the console prior to induction.  A timeout was   performed.  Flexible fiberoptic bronchoscopy then was performed via the endotracheal tube.  The right upper lobe stump was well healed.  There was some distortion of the anatomy of the bronchus intermedius and lower lobe due to the previous surgery.   There were no endobronchial lesions seen.  There were thick clear secretions bilaterally.  The locatable guide for navigation was placed.  Registration was performed.  The bronchoscope then was advanced to the left upper lobe bronchus and the appropriate  apical segmental bronchus was cannulated.  There were two different bronchi that led to the lesion.  The first came in from a slight angle.  Sampling was performed with three needle aspirations followed by three needle brushings, all sampling was done  with fluoroscopy.  While those specimens were being evaluated, multiple biopsies were obtained and these were all sent for permanent pathology.  The catheter then was repositioned into a separate subsegmental bronchus that led more directly to the lesion  and the sampling process was repeated.  The needle aspirations and brushings were both noted to be adequate specimens.  Multiple additional biopsies were taken from this second angle and again all sent for permanent pathology.  There was some minimal  bleeding with biopsies.  The bronchoscope then was directed to the origin of the superior segmental bronchus to the right lower lobe.  Multiple attempts were made to pass the locatable guide into the appropriate subsegmental bronchus.   The lesion was relatively medial and posterior and despite repeated attempts, the catheter could not be advanced to within good proximity of the lesion and no sampling was performed from the right lower lobe.  Final inspection was  made with the  bronchoscope.  There was no ongoing bleeding.  She was extubated in the operating room and taken to the postanesthetic care unit in good condition.  The total fluoroscopy time was 218  seconds.  The total dose was 23.57 mGy.     PAA D: 11/17/2020 5:50:10 pm T: 11/18/2020 2:37:00 am  JOB: [id number]/ [id number]

## 2020-11-18 NOTE — Progress Notes (Signed)
Due to transportation concerns, the patient would like to have radiation treatment in [place]. I have placed the referral and will message HIM to confirm records get transferred.

## 2020-11-21 ENCOUNTER — Telehealth: Payer: Self-pay | Admitting: Internal Medicine

## 2020-11-21 LAB — SURGICAL PATHOLOGY

## 2020-11-21 NOTE — Telephone Encounter (Signed)
Release: [id number] Per outgoing referral request, pt records were sent to [facility] @ fax# [phone]

## 2020-11-22 ENCOUNTER — Telehealth: Payer: Self-pay | Admitting: Internal Medicine

## 2020-11-22 NOTE — Telephone Encounter (Signed)
Scheduled appts per 5/12 sch msg. Called pt, no answer. Left msg with appts date and times.

## 2020-11-24 ENCOUNTER — Other Ambulatory Visit: Payer: Self-pay | Admitting: *Deleted

## 2020-11-24 NOTE — Progress Notes (Signed)
The proposed treatment discussed in cancer conference is for discussion purpose only and is not a binding recommendation. The patient was not physically examined nor present for their treatment options.  Therefore, final treatment plans cannot be decided.    Per Dr. Michail Sermon notified pathology to send molecular testing and PDL 1 to [facility].

## 2020-11-29 ENCOUNTER — Encounter: Payer: Self-pay | Admitting: Radiation Oncology

## 2020-11-29 NOTE — Progress Notes (Signed)
Radiation Oncology         [phone] ________________________________  Name: Heather [name] MRN: [id number]  Date: 11/29/2020  DOB: Aug 03, 1940  Chart Note:  I received a call from Dr. Olivia Canter, Rad-Onc at [facility] regarding this patient.    She is a very nice 80 yo woman with a history of NCSLC,adenocarcinoma, Stage Ib in the right upper lobe treated with a right upper lobectomy in 2013. Pathology showed adenocarcinoma measuring 1.7 cm, grade 2 with negative nodes and margins, pT1bN0M0. Since that time, she has been followed closely for multiple bilateral pulmonary nodules. She was initially consulted with Dr. Tammi Klippel in [facility] 2016 for an enlarging groundglass, hazy left upper lobe lesion that had increased from 5 mm in 2013 to1.3 cm in 2016. She was offered SBRTat that time but declined treatment and hasremained under close observation since that time.This lesion has continued to be followed, showing minimal growth and has remained predominantly groundglass without significant solid component. The additional bilateral subcentimeter nodules remainedstable as well. However, surveillance CT chest in November 2018 demonstrated a 1 x 1.6 x 1 cm lesion in the anterior medial left upper lobe that was solid appearing and increased insize from 0.8 x 1.1 x 0.6 cm on prior CT chest from November 2017. The other scattered pulmonary nodules remainedunchanged in the interval.   PET/CT in January 2019 confirmed that the enlarging nodule of concern in the medial aspect of the left upper lobe was hypermetabolic and most consistent with bronchogenic carcinoma. There were 2 additional groundglass/semisolid nodules in the left upper lobe which demonstrated mild metabolic activity as well as hypermetabolic activity at the resection margin at the right hilum. There wasno hypermetabolic mediastinal adenopathy or distant disease.  She underwent a CT-guided biopsy of the left upper lobe  lesion on July 25, 2017 confirming adenocarcinoma.  Molecular studies showed no actionable mutation except forMEK1deletion and PDL 1 expression was 10%.     This was treated with SBRT through 08/29/17 to 50Gy in 51fractions of10Gy    In routine follow-up, patient underwent chest CT March 25th 2022.  This study showed multiple bilateral mixed solid and groundglass opacities.  The most prominent lesion was in the left pulmonary apex measuring 1.6 cm prompting recommendation for PET scan.  PET scan done on April 7 showed that the left apical lesion had a hypermetabolic activity with an SUV max equal to 8.7.  There was a second focal area of metabolic activity in the left upper lobe just superior to the hilum with SUV max of 6.3 favoring suprahilar lymph node   In addition, there was a focal area of activity in the right hilum at the resection margin from her previous lobectomy with SUV max of 5.8 corresponding to a nodule along the fissure at the level of the azygos vein  The patient underwent bronchoscopy navigation May 12 with Dr. Roxan Hockey.  He was able to navigate in the biopsy of the left upper lung lesion.  However, given the previous surgery and distortion of airways, he was unable to reach the right lower lobe nodule.  The biopsy was positive for adenocarcinoma.  Her complex case was presented in the multidisciplinary thoracic oncology conference on 11/24/20.  The consensus recommendation at that time was to consider SBRT to the left upper lung nodule in the right sided nodule while continuing to observe the Left suprahilar site.  The patient was referred to Dr. Adella Nissen to discuss, but, since they don't have SBRT in [place], he  asked me to see her.  We will work on [place] during the next few weeks. ________________________________  Sheral Apley Tammi Klippel, M.D.

## 2020-12-06 DIAGNOSIS — C3410 Malignant neoplasm of upper lobe, unspecified bronchus or lung: Secondary | ICD-10-CM | POA: Diagnosis not present

## 2020-12-07 ENCOUNTER — Encounter (HOSPITAL_COMMUNITY): Payer: Self-pay

## 2020-12-07 ENCOUNTER — Ambulatory Visit: Payer: Medicare Other | Admitting: Radiation Oncology

## 2020-12-07 ENCOUNTER — Telehealth: Payer: Self-pay | Admitting: Internal Medicine

## 2020-12-07 ENCOUNTER — Encounter: Payer: Self-pay | Admitting: *Deleted

## 2020-12-07 DIAGNOSIS — C3491 Malignant neoplasm of unspecified part of right bronchus or lung: Secondary | ICD-10-CM

## 2020-12-07 NOTE — Progress Notes (Signed)
Per Dr. Julien Nordmann, I updated rad onc of patient molecular test results.  Dr. Worthy Flank plan is to treat her with oral biologic. I updated scheduling to call and get her in to see him next week.

## 2020-12-07 NOTE — Telephone Encounter (Signed)
Scheduled appts per 6/1 sch msg. Pt aware.

## 2020-12-08 ENCOUNTER — Encounter (HOSPITAL_COMMUNITY): Payer: Self-pay | Admitting: Internal Medicine

## 2020-12-09 ENCOUNTER — Ambulatory Visit: Payer: Medicare Other | Admitting: Radiation Oncology

## 2020-12-09 ENCOUNTER — Ambulatory Visit: Payer: Medicare Other

## 2020-12-10 DIAGNOSIS — I10 Essential (primary) hypertension: Secondary | ICD-10-CM | POA: Diagnosis not present

## 2020-12-10 DIAGNOSIS — I5022 Chronic systolic (congestive) heart failure: Secondary | ICD-10-CM | POA: Diagnosis not present

## 2020-12-12 NOTE — Progress Notes (Signed)
[place], [place], MD [address] [place] [address]  DIAGNOSIS:  1) Stage IA non-small cell lung, adenocarcinoma diagnosed in March 2013. 2) non-small cell lung cancer, adenocarcinoma in January 2019  2) Non-Small Cell lung Cancer, adenocarcinoma, in the left upper lobe suspicious for stage IA.  She also has some other small pulmonary nodules that could be low-grade adenocarcinoma.   Biomarker Findings Microsatellite status - Cannot Be Determined ? Tumor Mutational Burden - 3 Muts/Mb Genomic Findings For a complete list of the genes assayed, please refer to the Appendix. EGFR L858R MTAP loss exons 3-8 CDKN2A/B CDKN2B loss, CDKN2A loss SMAD4 Q534* TP53 R248L 7 Disease relevant genes with no reportable alterations: ALK, BRAF, ERBB2, KRAS, MET, RET, ROS1  PDL1 expression: 1%  PRIOR THERAPY:  1) status post right upper lobectomy with lymph node dissection on September 24, 2011 2) status post stereotactic radiotherapy to the left upper lobe lung nodule in February 2019.    CURRENT THERAPY: Targeted treatment with Tagrisso 80 mg p.o. daily.  First dose expected in the next few weeks.   INTERVAL HISTORY: Heather [name] 80 y.o. female returns to clinic today for a follow-up visit.  The patient was last seen in clinic on 11/16/2020.  At her last appointment, she had suspicious evidence of adenocarcinoma.  Dr. Koleen Nimrod performed a navigational bronchoscopy to the left apical lesion which was positive for adenocarcinoma.  He was unable to navigate to the right lower lobe nodule due to the angle but it is also likely adenocarcinoma.  The plan was initially to refer her to radiation oncology for SBRT; however, the patient's molecular studies showed that she was positive for an EGFR mutation.  Therefore, she is being seen in the clinic today for consideration of systemic treatment options with oral targeted treatment with Tagrisso.    Overall, the patient denies any new concerning complaints today.  She denies any fever, chills, night sweats, or weight loss.  She denies any shortness of breath, cough, or hemoptysis.  She denies any nausea, vomiting, diarrhea, or constipation.  She denies any headache or visual changes.  The patient is here today for evaluation and to discuss targeted treatment with Tagrisso.   MEDICAL HISTORY: Past Medical History:  Diagnosis Date   Arthritis    reports her joints are fine and she has no pain    Ascending aortic aneurysm ([place]) 07/20/2014   4.1 cm by CT   Atrial fib/flutter, transient    post-op   CHF (congestive heart failure) ([place])    a. EF 30-35% by echo in 2013 --> similar results by repeat imaging in 2016 and 11/2017.   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 ([place]) 09/2011   Stage IB- s/p right upper lobectomy   Pneumonia    Psoriasis    Ankles   Tobacco abuse     ALLERGIES:  is allergic to ibuprofen and powder.  MEDICATIONS:  Current Outpatient Medications  Medication Sig Dispense Refill   alendronate (FOSAMAX) 70 MG tablet Take 70 mg by mouth every Monday.   3   Calcium Carb-Cholecalciferol (OYSTER SHELL CALCIUM W/D) 500-200 MG-UNIT TABS Take 1 tablet by mouth 3 (three) times daily.     glucosamine-chondroitin 500-400 MG tablet Take 1 tablet by mouth daily.     hydrochlorothiazide (MICROZIDE) 12.5 MG  capsule Take 12.5 mg by mouth daily at 2 PM.  3   hydrocortisone (ANUSOL-HC) 2.5 % rectal cream Place 1 application rectally 2 (two) times daily. 30 g 1   meclizine (ANTIVERT) 12.5 MG tablet Take 12.5 mg by mouth daily at 12 noon.     metoprolol succinate (TOPROL XL) 25 MG 24 hr tablet Take 1 tablet (25 mg total) by mouth 2 (two) times daily. 60 tablet 6   Multiple Vitamins-Iron (MULTIVITAMINS WITH IRON) TABS Take 1 tablet by mouth daily.     Omega-3 Fatty Acids (FISH  OIL) 500 MG CAPS Take 500 mg by mouth daily.      osimertinib mesylate (TAGRISSO) 80 MG tablet Take 1 tablet (80 mg total) by mouth daily. 30 tablet 3   vitamin B-12 (CYANOCOBALAMIN) 500 MCG tablet Take 500 mcg by mouth daily.     No current facility-administered medications for this visit.    SURGICAL HISTORY:  Past Surgical History:  Procedure Laterality Date   CATARACT EXTRACTION Bilateral 2016   COLONOSCOPY  01/22/2012   Procedure: COLONOSCOPY;  Surgeon: Danie Binder, MD;  [facility]: [facility];  Service: Endoscopy;  Laterality: N/A;  10:45   COLONOSCOPY N/A 04/15/2017   Four 4-6 mm polyps in descending colon, ascending, and cecum. One 15 mm polyp in proximal ascending colon, one 4 mm poylp at hepatic flexure, two 3-4 mm polyps in sigmoid and descending. Internal hemorrhoids. Colonoscopy in 3-5 years if benefits outweigh risks. Simple adenomas.    IR RADIOLOGIST EVAL & MGMT  03/04/2018   POLYPECTOMY  04/15/2017   Procedure: POLYPECTOMY;  Surgeon: Danie Binder, MD;  Location: [facility];  Service: Endoscopy;;  ascending colon   RADIOLOGY WITH ANESTHESIA N/A 03/19/2018   Procedure: CT MICROWAVE THERMAL ABLATION WITH ANESTHESIA;  Surgeon: Jacqulynn Cadet, MD;  Location: [facility];  Service: Anesthesiology;  Laterality: N/A;   right VATS (upper lobectomy)  08/29/11   Dr Roxan Hockey   VIDEO BRONCHOSCOPY WITH ENDOBRONCHIAL NAVIGATION N/A 03/18/2015   Procedure: VIDEO BRONCHOSCOPY WITH ENDOBRONCHIAL NAVIGATION;  Surgeon: [name], MD;  Location: [place];  Service: Thoracic;  Laterality: N/A;   VIDEO BRONCHOSCOPY WITH ENDOBRONCHIAL NAVIGATION N/A 11/17/2020   Procedure: VIDEO BRONCHOSCOPY WITH ENDOBRONCHIAL NAVIGATION;  Surgeon: [name], MD;  Location: [place];  Service: Thoracic;  Laterality: N/A;    REVIEW OF SYSTEMS:   Review of Systems  Constitutional: Negative for appetite change, chills, fatigue, fever and unexpected weight change.  HENT: Negative for mouth  sores, nosebleeds, sore throat and trouble swallowing.   Eyes: Negative for eye problems and icterus.  Respiratory: Negative for cough, hemoptysis, shortness of breath and wheezing.   Cardiovascular: Negative for chest pain and leg swelling.  Gastrointestinal: Negative for abdominal pain, constipation, diarrhea, nausea and vomiting.  Genitourinary: Negative for bladder incontinence, difficulty urinating, dysuria, frequency and hematuria.   Musculoskeletal: Negative for back pain, gait problem, neck pain and neck stiffness.  Skin: Negative for itching and rash.  Neurological: Negative for dizziness, extremity weakness, gait problem, headaches, light-headedness and seizures.  Hematological: Negative for adenopathy. Does not bruise/bleed easily.  Psychiatric/Behavioral: Negative for confusion, depression and sleep disturbance. The patient is not nervous/anxious.     PHYSICAL EXAMINATION:  Blood pressure (!) 139/91, pulse 82, temperature (!) 97 F (36.1 C), temperature source Tympanic, resp. rate 19, height 5' 7"  (1.702 m), weight 156 lb 14.4 oz (71.2 kg), SpO2 98 %.  ECOG PERFORMANCE STATUS: 0 - Asymptomatic  Physical Exam  Constitutional: Oriented  to person, place, and time and well-developed, well-nourished, and in no distress.  HENT:  Head: Normocephalic and atraumatic.  Mouth/Throat: Oropharynx is clear and moist. No oropharyngeal exudate.  Eyes: Conjunctivae are normal. Right eye exhibits no discharge. Left eye exhibits no discharge. No scleral icterus.  Neck: Normal range of motion. Neck supple.  Cardiovascular: Normal rate, regular rhythm, normal heart sounds and intact distal pulses.   Pulmonary/Chest: Effort normal and breath sounds normal. No respiratory distress. No wheezes. No rales.  Abdominal: Soft. Bowel sounds are normal. Exhibits no distension and no mass. There is no tenderness.  Musculoskeletal: Normal range of motion. Exhibits no edema.  Lymphadenopathy:    No cervical  adenopathy.  Neurological: Alert and oriented to person, place, and time. Exhibits normal muscle tone. Gait normal. Coordination normal.  Skin: Skin is warm and dry. No rash noted. Not diaphoretic. No erythema. No pallor.  Psychiatric: Mood, memory and judgment normal.  Vitals reviewed.  LABORATORY DATA: Lab Results  Component Value Date   WBC 8.0 12/15/2020   HGB 12.1 12/15/2020   HCT 36.9 12/15/2020   MCV 92.7 12/15/2020   PLT 159 12/15/2020      Chemistry      Component Value Date/Time   NA 145 12/15/2020 0716   K 3.7 12/15/2020 0716   CL 105 12/15/2020 0716   CO2 29 12/15/2020 0716   BUN 10 12/15/2020 0716   CREATININE 0.96 12/15/2020 0716      Component Value Date/Time   CALCIUM 9.8 12/15/2020 0716   ALKPHOS 73 12/15/2020 0716   AST 18 12/15/2020 0716   ALT 9 12/15/2020 0716   BILITOT 0.9 12/15/2020 0716       RADIOGRAPHIC STUDIES:  DG Chest 2 View  Result Date: 11/16/2020 CLINICAL DATA:  Preoperative evaluation, history of non-small cell lung cancer EXAM: CHEST - 2 VIEW COMPARISON:  07/25/2017 FINDINGS: Frontal and lateral views of the chest demonstrates stable enlargement the cardiac silhouette. Stable ectasia of the thoracic aorta. The spiculated nodule at the left apex on previous CT exams is obscured by confluence of the clavicle and first rib. No acute airspace disease, effusion, or pneumothorax. No acute bony abnormalities. IMPRESSION: 1. Known left upper lobe lung cancer is obscured by confluence of bony shadows. 2. Otherwise no acute intrathoracic process. Electronically Signed   By: Randa Ngo M.D.   On: 11/16/2020 17:22   DG C-ARM BRONCHOSCOPY  Result Date: 11/17/2020 C-ARM BRONCHOSCOPY: Fluoroscopy was utilized by the requesting physician.  No radiographic interpretation.      ASSESSMENT/PLAN:  This is a very pleasant 80 year old African-American female diagnosed originally with non-small cell lung cancer, adenocarcinoma in the right upper lobe in  March 2013.  She also was diagnosed with non-small cell lung cancer, adenocarcinoma on the left upper lobe status post stereotactic radiotherapy to this lesion in 2019. She has a new left pulmonary nodule which was positive for adenocarcinoma and another focus in the right lower lobe that is also suspicious for adenocarcinoma which was diagnosed in May 2022.  The patient's molecular studies shows that she is positive for an EGFR mutation.  The patient was seen with Dr. Julien Nordmann today.  Dr. Julien Nordmann had a lengthy discussion today with the patient about her current condition and recommended treatment options.  Given her EGFR mutation, Dr. Julien Nordmann recommends that she proceed with oral targeted chemotherapy with Tagrisso 80 mg p.o. daily. The patient is interested in this option and she is expected to start her first dose of treatment  in the next few days.   I will arrange for the patient to have a baseline EKG performed in the clinic today. No prolonged QT/QTc on EKG. QT 422s/432 ms   The patient will meet with the oral chemotherapy pharmacist today for education and teaching.   We discussed with her the adverse effect of this treatment including skin rash, diarrhea, interstitial lung disease, liver or renal dysfunction.   We will see the patient back for follow-up visit in 2.5 for evaluation and for repeat blood work.  The patient was advised to call immediately if she has any concerning symptoms in the interval. The patient voices understanding of current disease status and treatment options and is in agreement with the current care plan. All questions were answered. The patient knows to call the clinic with any problems, questions or concerns. We can certainly see the patient much sooner if necessary       Orders Placed This Encounter  Procedures   CBC with Differential ([place] Only)    Standing Status:   Future    Standing Expiration Date:   12/15/2021   CMP ([place] only)     Standing Status:   Future    Standing Expiration Date:   12/15/2021   EKG 12-Lead      Zilphia Kozinski L Kellye Mizner, PA-C 12/15/20  ADDENDUM: Hematology/Oncology Attending: I had a face-to-face encounter with the patient.  I reviewed her records, labs and scan and recommended her care plan.  This is a very pleasant 80 years old African-American female with recurrent non-small cell lung cancer that was initially diagnosed in 2013 status post surgical resection followed by SBRT to recurrent disease in the left lung.  The patient had recent evidence for bilateral disease involving the left and right lung as well as hilar lymphadenopathy. She had molecular [facility] performed by [facility] that showed positive EGFR mutation in exon 21 (L858R).  PD-L1 expression was 1%. I had a lengthy discussion with the patient today about her current condition and treatment options. I recommended for the patient treatment with targeted therapy with osimertinib 80 mg p.o. daily. I discussed with the patient the adverse effect of this treatment including but not limited to skin rash, diarrhea, interstitial lung disease, liver, kidney or cardiac dysfunction. We will arrange for the patient to have a EKG before starting the first dose of her treatment. The patient is interested in the treatment and she will be seen by the pharmacist for oral oncolytic today for education as well as to help the patient with the approval of her medication. The patient will come back for follow-up visit in around 2-3 weeks for evaluation and management of any adverse effect of her treatment. She was advised to call immediately if she has any concerning symptoms in the interval. The total time spent in the appointment was 35 minutes. Disclaimer: This note was dictated with voice recognition software. Similar sounding words can inadvertently be transcribed and may be missed upon review.  Eilleen Kempf, MD 12/16/20

## 2020-12-15 ENCOUNTER — Other Ambulatory Visit: Payer: Self-pay

## 2020-12-15 ENCOUNTER — Other Ambulatory Visit: Payer: Self-pay | Admitting: Internal Medicine

## 2020-12-15 ENCOUNTER — Inpatient Hospital Stay: Payer: Medicare Other | Attending: Internal Medicine

## 2020-12-15 ENCOUNTER — Telehealth: Payer: Self-pay | Admitting: Pharmacist

## 2020-12-15 ENCOUNTER — Inpatient Hospital Stay (HOSPITAL_BASED_OUTPATIENT_CLINIC_OR_DEPARTMENT_OTHER): Payer: Medicare Other | Admitting: Physician Assistant

## 2020-12-15 ENCOUNTER — Other Ambulatory Visit (HOSPITAL_COMMUNITY): Payer: Self-pay

## 2020-12-15 VITALS — BP 139/91 | HR 82 | Temp 97.0°F | Resp 19 | Ht 67.0 in | Wt 156.9 lb

## 2020-12-15 DIAGNOSIS — Z79899 Other long term (current) drug therapy: Secondary | ICD-10-CM | POA: Diagnosis not present

## 2020-12-15 DIAGNOSIS — Z8719 Personal history of other diseases of the digestive system: Secondary | ICD-10-CM | POA: Insufficient documentation

## 2020-12-15 DIAGNOSIS — C3412 Malignant neoplasm of upper lobe, left bronchus or lung: Secondary | ICD-10-CM

## 2020-12-15 DIAGNOSIS — Z886 Allergy status to analgesic agent status: Secondary | ICD-10-CM | POA: Insufficient documentation

## 2020-12-15 DIAGNOSIS — C3492 Malignant neoplasm of unspecified part of left bronchus or lung: Secondary | ICD-10-CM | POA: Insufficient documentation

## 2020-12-15 DIAGNOSIS — C3491 Malignant neoplasm of unspecified part of right bronchus or lung: Secondary | ICD-10-CM

## 2020-12-15 LAB — CBC WITH DIFFERENTIAL (CANCER CENTER ONLY)
Abs Immature Granulocytes: 0.02 10*3/uL (ref 0.00–0.07)
Basophils Absolute: 0 10*3/uL (ref 0.0–0.1)
Basophils Relative: 0 %
Eosinophils Absolute: 0.1 10*3/uL (ref 0.0–0.5)
Eosinophils Relative: 2 %
HCT: 36.9 % (ref 36.0–46.0)
Hemoglobin: 12.1 g/dL (ref 12.0–15.0)
Immature Granulocytes: 0 %
Lymphocytes Relative: 48 %
Lymphs Abs: 3.9 10*3/uL (ref 0.7–4.0)
MCH: 30.4 pg (ref 26.0–34.0)
MCHC: 32.8 g/dL (ref 30.0–36.0)
MCV: 92.7 fL (ref 80.0–100.0)
Monocytes Absolute: 0.6 10*3/uL (ref 0.1–1.0)
Monocytes Relative: 7 %
Neutro Abs: 3.4 10*3/uL (ref 1.7–7.7)
Neutrophils Relative %: 43 %
Platelet Count: 159 10*3/uL (ref 150–400)
RBC: 3.98 MIL/uL (ref 3.87–5.11)
RDW: 13.7 % (ref 11.5–15.5)
WBC Count: 8 10*3/uL (ref 4.0–10.5)
nRBC: 0 % (ref 0.0–0.2)

## 2020-12-15 LAB — CMP (CANCER CENTER ONLY)
ALT: 9 U/L (ref 0–44)
AST: 18 U/L (ref 15–41)
Albumin: 3.8 g/dL (ref 3.5–5.0)
Alkaline Phosphatase: 73 U/L (ref 38–126)
Anion gap: 11 (ref 5–15)
BUN: 10 mg/dL (ref 8–23)
CO2: 29 mmol/L (ref 22–32)
Calcium: 9.8 mg/dL (ref 8.9–10.3)
Chloride: 105 mmol/L (ref 98–111)
Creatinine: 0.96 mg/dL (ref 0.44–1.00)
GFR, Estimated: 60 mL/min — ABNORMAL LOW (ref >60)
Glucose, Bld: 100 mg/dL — ABNORMAL HIGH (ref 70–99)
Potassium: 3.7 mmol/L (ref 3.5–5.1)
Sodium: 145 mmol/L (ref 135–145)
Total Bilirubin: 0.9 mg/dL (ref 0.3–1.2)
Total Protein: 7.1 g/dL (ref 6.5–8.1)

## 2020-12-15 MED ORDER — OSIMERTINIB MESYLATE 80 MG PO TABS
80.0000 mg | ORAL_TABLET | Freq: Every day | ORAL | 3 refills | Status: AC
  Filled 2020-12-15 (×2): qty 30, 30d supply, fill #0
  Filled 2021-01-04: qty 30, 30d supply, fill #1
  Filled 2021-01-24: qty 30, 30d supply, fill #2

## 2020-12-15 NOTE — Progress Notes (Signed)
START ON PATHWAY REGIMEN - Non-Small Cell Lung     A cycle is every 28 days:     Osimertinib   **Always confirm dose/schedule in your pharmacy ordering system**  Patient Characteristics: Stage IV Metastatic, Nonsquamous, Molecular Analysis Completed, Molecular Alteration Present and Eligible for Molecular Targeted Therapy, Initial Molecular Targeted Therapy, EGFR Mutation - Common (Exon 19 Deletion or Exon 21 L858R Substitution) Therapeutic Status: Stage IV Metastatic Histology: Nonsquamous Cell Broad Molecular Profiling Status: Molecular Analysis Completed Molecular Analysis Results: Alteration Present and Eligible for Molecular Targeted Therapy Molecular Alteration Present: EGFR Mutation - Common (Exon 19 Deletion or Exon 21 L858R Substitution) Molecular Targeted Line of Therapy: Initial Molecular Targeted Therapy Intent of Therapy: Non-Curative / Palliative Intent, Discussed with Patient 

## 2020-12-15 NOTE — Telephone Encounter (Signed)
Oral Oncology Pharmacist Encounter  Received new prescription for Tagrisso (osimertinib) for the treatment of non-small cell lung cancer, EGFR L858R positive, planned duration until disease progression or unacceptable drug toxicity.  Prescription dose and frequency assessed for appropriateness. Appropriate for therapy initiation.   CMP and CBC w/ Diff from 12/15/20 assessed, no dose adjustments required. Baseline EKG obtained on 12/15/20.  Current medication list in Epic reviewed, no relevant/significant DDIs with Tagrisso identified.  Evaluated chart and no patient barriers to medication adherence noted.   Patient agreement for treatment documented in MD note on 12/15/20.  Prescription has been e-scribed to the [facility] for benefits analysis and approval.  Oral Oncology Clinic will continue to follow for insurance authorization, copayment issues, initial counseling and start date.  Leron Croak, PharmD, BCPS Hematology/Oncology Clinical Pharmacist [facility] [phone] 12/15/2020 9:21 AM

## 2020-12-15 NOTE — Telephone Encounter (Signed)
Oral Oncology Pharmacist Encounter  Prior Authorization for Tagrisso (osimertinib) has been approved.    PA# [id number]  Effective dates: 12/15/20 through 07/08/21  Patients co-pay is $0  Oral Oncology Clinic will continue to follow.   Leron Croak, PharmD, BCPS Hematology/Oncology Clinical Pharmacist [facility] [phone] 12/15/2020 9:54 AM

## 2020-12-15 NOTE — Telephone Encounter (Signed)
Oral Oncology Pharmacist Encounter  Received notification from [employer] that prior authorization for Tagrisso (osimertinib) is required.   PA submitted on CoverMyMeds Key [id number] Status is pending   Oral Oncology Clinic will continue to follow.  Leron Croak, PharmD, BCPS Hematology/Oncology Clinical Pharmacist [facility] [phone] 12/15/2020 9:33 AM

## 2020-12-15 NOTE — Telephone Encounter (Signed)
Oral Chemotherapy Pharmacist Encounter  I spoke with patient for overview of: Tagrisso for the treatment of EGFR mutation-positive (L858R) non-small cell lung cancer, planned duration until disease progression or unacceptable toxicity.   Counseled patient on administration, dosing, side effects, monitoring, drug-food interactions, safe handling, storage, and disposal.  Patient will take Tagrisso 80 tablets, 1 tablet by mouth once daily, without regard to food.  Tagrisso start date: 12/17/20  Adverse effects include but are not limited to: diarrhea, mouth sores, decreased appetitie, fatigue, dry skin, rash, nail changes, altered cardiac conduction, and decreased blood counts or electrolytes.  Patient will obtain anti diarrheal and alert the office of 4 or more loose stools above baseline.   Reviewed with patient importance of keeping a medication schedule and plan for any missed doses. No barriers to medication adherence identified.  Medication reconciliation performed and medication/allergy list updated.  Insurance authorization for [place] has been obtained. Test claim at the pharmacy revealed copayment $0 for 1st fill of Tagrisso. This will ship from the [facility] on 12/15/20 to deliver to patient's home on 12/16/20.  Patient informed the pharmacy will reach out 5-7 days prior to needing next fill of Tagrisso to coordinate continued medication acquisition to prevent break in therapy.  All questions answered.  Ms. Schaper voiced understanding and appreciation.   Medication education handout given to patient. Patient knows to call the office with questions or concerns. Oral Chemotherapy Clinic phone number provided to patient.   Leron Croak, PharmD, BCPS Hematology/Oncology Clinical Pharmacist [facility] [phone] 12/15/2020 11:17 AM

## 2020-12-16 ENCOUNTER — Encounter: Payer: Self-pay | Admitting: Internal Medicine

## 2020-12-16 ENCOUNTER — Telehealth: Payer: Self-pay | Admitting: Physician Assistant

## 2020-12-16 NOTE — Telephone Encounter (Signed)
Scheduled per los. Called and spoke with patient. Confirmed appt 

## 2020-12-21 ENCOUNTER — Encounter: Payer: Self-pay | Admitting: *Deleted

## 2020-12-21 NOTE — Progress Notes (Signed)
I followed up on Ms. Barberi's molecular test results. They are completed. I printed and place on Cassie PA-C desk.  Patient has an appt with her on 6/27.

## 2020-12-22 DIAGNOSIS — S99922A Unspecified injury of left foot, initial encounter: Secondary | ICD-10-CM | POA: Diagnosis not present

## 2020-12-22 DIAGNOSIS — M21612 Bunion of left foot: Secondary | ICD-10-CM | POA: Diagnosis not present

## 2020-12-22 DIAGNOSIS — M2012 Hallux valgus (acquired), left foot: Secondary | ICD-10-CM | POA: Diagnosis not present

## 2020-12-22 DIAGNOSIS — S9032XA Contusion of left foot, initial encounter: Secondary | ICD-10-CM | POA: Diagnosis not present

## 2020-12-22 DIAGNOSIS — W208XXA Other cause of strike by thrown, projected or falling object, initial encounter: Secondary | ICD-10-CM | POA: Diagnosis not present

## 2020-12-30 DIAGNOSIS — L03116 Cellulitis of left lower limb: Secondary | ICD-10-CM | POA: Diagnosis not present

## 2020-12-30 DIAGNOSIS — S99922A Unspecified injury of left foot, initial encounter: Secondary | ICD-10-CM | POA: Diagnosis not present

## 2020-12-30 DIAGNOSIS — M2012 Hallux valgus (acquired), left foot: Secondary | ICD-10-CM | POA: Diagnosis not present

## 2021-01-02 ENCOUNTER — Telehealth: Payer: Self-pay

## 2021-01-02 ENCOUNTER — Inpatient Hospital Stay: Payer: Medicare Other

## 2021-01-02 ENCOUNTER — Inpatient Hospital Stay: Payer: Medicare Other | Admitting: Physician Assistant

## 2021-01-02 DIAGNOSIS — I70212 Atherosclerosis of native arteries of extremities with intermittent claudication, left leg: Secondary | ICD-10-CM | POA: Diagnosis not present

## 2021-01-02 DIAGNOSIS — C3411 Malignant neoplasm of upper lobe, right bronchus or lung: Secondary | ICD-10-CM | POA: Diagnosis not present

## 2021-01-02 DIAGNOSIS — K8020 Calculus of gallbladder without cholecystitis without obstruction: Secondary | ICD-10-CM | POA: Diagnosis not present

## 2021-01-02 DIAGNOSIS — I774 Celiac artery compression syndrome: Secondary | ICD-10-CM | POA: Diagnosis not present

## 2021-01-02 DIAGNOSIS — I517 Cardiomegaly: Secondary | ICD-10-CM | POA: Diagnosis not present

## 2021-01-02 DIAGNOSIS — M7989 Other specified soft tissue disorders: Secondary | ICD-10-CM | POA: Diagnosis not present

## 2021-01-02 DIAGNOSIS — S92532D Displaced fracture of distal phalanx of left lesser toe(s), subsequent encounter for fracture with routine healing: Secondary | ICD-10-CM | POA: Diagnosis not present

## 2021-01-02 DIAGNOSIS — Z7983 Long term (current) use of bisphosphonates: Secondary | ICD-10-CM | POA: Diagnosis not present

## 2021-01-02 DIAGNOSIS — R059 Cough, unspecified: Secondary | ICD-10-CM | POA: Diagnosis not present

## 2021-01-02 DIAGNOSIS — N1831 Chronic kidney disease, stage 3a: Secondary | ICD-10-CM | POA: Diagnosis not present

## 2021-01-02 DIAGNOSIS — L03032 Cellulitis of left toe: Secondary | ICD-10-CM | POA: Diagnosis not present

## 2021-01-02 DIAGNOSIS — I739 Peripheral vascular disease, unspecified: Secondary | ICD-10-CM | POA: Diagnosis not present

## 2021-01-02 DIAGNOSIS — I75022 Atheroembolism of left lower extremity: Secondary | ICD-10-CM | POA: Diagnosis not present

## 2021-01-02 DIAGNOSIS — I70262 Atherosclerosis of native arteries of extremities with gangrene, left leg: Secondary | ICD-10-CM | POA: Diagnosis not present

## 2021-01-02 DIAGNOSIS — Z85118 Personal history of other malignant neoplasm of bronchus and lung: Secondary | ICD-10-CM | POA: Diagnosis not present

## 2021-01-02 DIAGNOSIS — C3491 Malignant neoplasm of unspecified part of right bronchus or lung: Secondary | ICD-10-CM | POA: Diagnosis not present

## 2021-01-02 DIAGNOSIS — I10 Essential (primary) hypertension: Secondary | ICD-10-CM | POA: Diagnosis not present

## 2021-01-02 DIAGNOSIS — L03116 Cellulitis of left lower limb: Secondary | ICD-10-CM | POA: Diagnosis not present

## 2021-01-02 DIAGNOSIS — C3492 Malignant neoplasm of unspecified part of left bronchus or lung: Secondary | ICD-10-CM | POA: Diagnosis not present

## 2021-01-02 DIAGNOSIS — Z20822 Contact with and (suspected) exposure to covid-19: Secondary | ICD-10-CM | POA: Diagnosis not present

## 2021-01-02 DIAGNOSIS — M62272 Nontraumatic ischemic infarction of muscle, left ankle and foot: Secondary | ICD-10-CM | POA: Diagnosis not present

## 2021-01-02 DIAGNOSIS — I998 Other disorder of circulatory system: Secondary | ICD-10-CM | POA: Diagnosis not present

## 2021-01-02 DIAGNOSIS — X58XXXA Exposure to other specified factors, initial encounter: Secondary | ICD-10-CM | POA: Diagnosis not present

## 2021-01-02 DIAGNOSIS — W208XXA Other cause of strike by thrown, projected or falling object, initial encounter: Secondary | ICD-10-CM | POA: Diagnosis not present

## 2021-01-02 DIAGNOSIS — Z79899 Other long term (current) drug therapy: Secondary | ICD-10-CM | POA: Diagnosis not present

## 2021-01-02 DIAGNOSIS — S92532A Displaced fracture of distal phalanx of left lesser toe(s), initial encounter for closed fracture: Secondary | ICD-10-CM | POA: Diagnosis not present

## 2021-01-02 DIAGNOSIS — I743 Embolism and thrombosis of arteries of the lower extremities: Secondary | ICD-10-CM | POA: Diagnosis not present

## 2021-01-02 DIAGNOSIS — Z87891 Personal history of nicotine dependence: Secondary | ICD-10-CM | POA: Diagnosis not present

## 2021-01-02 NOTE — Telephone Encounter (Signed)
This nurse spoke with patient related to missed appt this morning.  Patient stated that she missed her transportation because she was in the ED.  Patient appts were rescheduled for 7/5  and transportation has been arranged.  Patient made aware.  No questions or concerns at this time.

## 2021-01-03 DIAGNOSIS — C3492 Malignant neoplasm of unspecified part of left bronchus or lung: Secondary | ICD-10-CM | POA: Diagnosis not present

## 2021-01-03 DIAGNOSIS — S92532A Displaced fracture of distal phalanx of left lesser toe(s), initial encounter for closed fracture: Secondary | ICD-10-CM | POA: Diagnosis not present

## 2021-01-03 DIAGNOSIS — X58XXXA Exposure to other specified factors, initial encounter: Secondary | ICD-10-CM | POA: Diagnosis not present

## 2021-01-03 DIAGNOSIS — I10 Essential (primary) hypertension: Secondary | ICD-10-CM | POA: Diagnosis not present

## 2021-01-03 DIAGNOSIS — C3491 Malignant neoplasm of unspecified part of right bronchus or lung: Secondary | ICD-10-CM | POA: Diagnosis not present

## 2021-01-03 DIAGNOSIS — Z79899 Other long term (current) drug therapy: Secondary | ICD-10-CM | POA: Diagnosis not present

## 2021-01-03 DIAGNOSIS — L03032 Cellulitis of left toe: Secondary | ICD-10-CM | POA: Diagnosis not present

## 2021-01-04 ENCOUNTER — Other Ambulatory Visit (HOSPITAL_COMMUNITY): Payer: Self-pay

## 2021-01-04 DIAGNOSIS — I10 Essential (primary) hypertension: Secondary | ICD-10-CM | POA: Diagnosis not present

## 2021-01-04 DIAGNOSIS — X58XXXA Exposure to other specified factors, initial encounter: Secondary | ICD-10-CM | POA: Diagnosis not present

## 2021-01-04 DIAGNOSIS — Z79899 Other long term (current) drug therapy: Secondary | ICD-10-CM | POA: Diagnosis not present

## 2021-01-04 DIAGNOSIS — C3491 Malignant neoplasm of unspecified part of right bronchus or lung: Secondary | ICD-10-CM | POA: Diagnosis not present

## 2021-01-04 DIAGNOSIS — C3492 Malignant neoplasm of unspecified part of left bronchus or lung: Secondary | ICD-10-CM | POA: Diagnosis not present

## 2021-01-04 DIAGNOSIS — S92532A Displaced fracture of distal phalanx of left lesser toe(s), initial encounter for closed fracture: Secondary | ICD-10-CM | POA: Diagnosis not present

## 2021-01-04 DIAGNOSIS — L03032 Cellulitis of left toe: Secondary | ICD-10-CM | POA: Diagnosis not present

## 2021-01-05 DIAGNOSIS — L03032 Cellulitis of left toe: Secondary | ICD-10-CM | POA: Diagnosis not present

## 2021-01-05 DIAGNOSIS — Z79899 Other long term (current) drug therapy: Secondary | ICD-10-CM | POA: Diagnosis not present

## 2021-01-05 DIAGNOSIS — X58XXXA Exposure to other specified factors, initial encounter: Secondary | ICD-10-CM | POA: Diagnosis not present

## 2021-01-05 DIAGNOSIS — S92532A Displaced fracture of distal phalanx of left lesser toe(s), initial encounter for closed fracture: Secondary | ICD-10-CM | POA: Diagnosis not present

## 2021-01-05 DIAGNOSIS — C3492 Malignant neoplasm of unspecified part of left bronchus or lung: Secondary | ICD-10-CM | POA: Diagnosis not present

## 2021-01-05 DIAGNOSIS — I10 Essential (primary) hypertension: Secondary | ICD-10-CM | POA: Diagnosis not present

## 2021-01-05 DIAGNOSIS — C3491 Malignant neoplasm of unspecified part of right bronchus or lung: Secondary | ICD-10-CM | POA: Diagnosis not present

## 2021-01-06 DIAGNOSIS — I10 Essential (primary) hypertension: Secondary | ICD-10-CM | POA: Diagnosis not present

## 2021-01-06 DIAGNOSIS — S92532A Displaced fracture of distal phalanx of left lesser toe(s), initial encounter for closed fracture: Secondary | ICD-10-CM | POA: Diagnosis not present

## 2021-01-06 DIAGNOSIS — M62272 Nontraumatic ischemic infarction of muscle, left ankle and foot: Secondary | ICD-10-CM | POA: Diagnosis not present

## 2021-01-06 DIAGNOSIS — X58XXXA Exposure to other specified factors, initial encounter: Secondary | ICD-10-CM | POA: Diagnosis not present

## 2021-01-06 DIAGNOSIS — C3492 Malignant neoplasm of unspecified part of left bronchus or lung: Secondary | ICD-10-CM | POA: Diagnosis not present

## 2021-01-06 DIAGNOSIS — C3491 Malignant neoplasm of unspecified part of right bronchus or lung: Secondary | ICD-10-CM | POA: Diagnosis not present

## 2021-01-06 DIAGNOSIS — Z79899 Other long term (current) drug therapy: Secondary | ICD-10-CM | POA: Diagnosis not present

## 2021-01-06 NOTE — Progress Notes (Deleted)
[facility] OFFICE PROGRESS NOTE  Heather Fire, MD [address] [place] [address]  DIAGNOSIS: ***  PRIOR THERAPY:  CURRENT THERAPY:  INTERVAL HISTORY: Heather [name] 80 y.o. female returns for *** regular *** visit for followup of ***   MEDICAL HISTORY: Past Medical History:  Diagnosis Date   Arthritis    reports her joints are fine and she has no pain    Ascending aortic aneurysm (HCC) 07/20/2014   4.1 cm by CT   Atrial fib/flutter, transient    post-op   CHF (congestive heart failure) ([place])    a. EF 30-35% by echo in 2013 --> similar results by repeat imaging in 2016 and 11/2017.   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 ([place]) 09/2011   Stage IB- s/p right upper lobectomy   Pneumonia    Psoriasis    Ankles   Tobacco abuse     ALLERGIES:  is allergic to ibuprofen and powder.  MEDICATIONS:  Current Outpatient Medications  Medication Sig Dispense Refill   alendronate (FOSAMAX) 70 MG tablet Take 70 mg by mouth every Monday.   3   Calcium Carb-Cholecalciferol (OYSTER SHELL CALCIUM W/D) 500-200 MG-UNIT TABS Take 1 tablet by mouth 3 (three) times daily.     glucosamine-chondroitin 500-400 MG tablet Take 1 tablet by mouth daily.     hydrochlorothiazide (MICROZIDE) 12.5 MG capsule Take 12.5 mg by mouth daily at 2 PM.  3   hydrocortisone (ANUSOL-HC) 2.5 % rectal cream Place 1 application rectally 2 (two) times daily. 30 g 1   meclizine (ANTIVERT) 12.5 MG tablet Take 12.5 mg by mouth daily at 12 noon.     metoprolol succinate (TOPROL XL) 25 MG 24 hr tablet Take 1 tablet (25 mg total) by mouth 2 (two) times daily. 60 tablet 6   Multiple Vitamins-Iron (MULTIVITAMINS WITH IRON) TABS Take 1 tablet by mouth daily.     Omega-3 Fatty Acids (FISH OIL) 500 MG CAPS Take 500 mg by mouth daily.      osimertinib mesylate (TAGRISSO) 80 MG tablet Take 1  tablet (80 mg total) by mouth daily. 30 tablet 3   vitamin B-12 (CYANOCOBALAMIN) 500 MCG tablet Take 500 mcg by mouth daily.     No current facility-administered medications for this visit.    SURGICAL HISTORY:  Past Surgical History:  Procedure Laterality Date   CATARACT EXTRACTION Bilateral 2016   COLONOSCOPY  01/22/2012   Procedure: COLONOSCOPY;  Surgeon: Danie Binder, MD;  [facility];  Service: Endoscopy;  Laterality: N/A;  10:45   COLONOSCOPY N/A 04/15/2017   Four 4-6 mm polyps in descending colon, ascending, and cecum. One 15 mm polyp in proximal ascending colon, one 4 mm poylp at hepatic flexure, two 3-4 mm polyps in sigmoid and descending. Internal hemorrhoids. Colonoscopy in 3-5 years if benefits outweigh risks. Simple adenomas.    IR RADIOLOGIST EVAL & MGMT  03/04/2018   POLYPECTOMY  04/15/2017   Procedure: POLYPECTOMY;  Surgeon: Danie Binder, MD;  [facility];  Service: Endoscopy;;  ascending colon   RADIOLOGY WITH ANESTHESIA N/A 03/19/2018   Procedure: CT MICROWAVE THERMAL ABLATION WITH ANESTHESIA;  Surgeon: Jacqulynn Cadet, MD;  Location: [facility];  Service: Anesthesiology;  Laterality: N/A;   right VATS (upper lobectomy)  08/29/11   Dr Roxan Hockey   VIDEO BRONCHOSCOPY  WITH ENDOBRONCHIAL NAVIGATION N/A 03/18/2015   Procedure: VIDEO BRONCHOSCOPY WITH ENDOBRONCHIAL NAVIGATION;  Surgeon: [name], MD;  Location: [facility];  Service: Thoracic;  Laterality: N/A;   VIDEO BRONCHOSCOPY WITH ENDOBRONCHIAL NAVIGATION N/A 11/17/2020   Procedure: VIDEO BRONCHOSCOPY WITH ENDOBRONCHIAL NAVIGATION;  Surgeon: [name], MD;  Location: [facility];  Service: Thoracic;  Laterality: N/A;    REVIEW OF SYSTEMS:   Review of Systems  Constitutional: Negative for appetite change, chills, fatigue, fever and unexpected weight change.  HENT:   Negative for mouth sores, nosebleeds, sore throat and trouble swallowing.   Eyes: Negative for eye problems and icterus.   Respiratory: Negative for cough, hemoptysis, shortness of breath and wheezing.   Cardiovascular: Negative for chest pain and leg swelling.  Gastrointestinal: Negative for abdominal pain, constipation, diarrhea, nausea and vomiting.  Genitourinary: Negative for bladder incontinence, difficulty urinating, dysuria, frequency and hematuria.   Musculoskeletal: Negative for back pain, gait problem, neck pain and neck stiffness.  Skin: Negative for itching and rash.  Neurological: Negative for dizziness, extremity weakness, gait problem, headaches, light-headedness and seizures.  Hematological: Negative for adenopathy. Does not bruise/bleed easily.  Psychiatric/Behavioral: Negative for confusion, depression and sleep disturbance. The patient is not nervous/anxious.     PHYSICAL EXAMINATION:  There were no vitals taken for this visit.  ECOG PERFORMANCE STATUS: {CHL ONC ECOG [id number]  Physical Exam  Constitutional: Oriented to person, place, and time and well-developed, well-nourished, and in no distress. No distress.  HENT:  Head: Normocephalic and atraumatic.  Mouth/Throat: Oropharynx is clear and moist. No oropharyngeal exudate.  Eyes: Conjunctivae are normal. Right eye exhibits no discharge. Left eye exhibits no discharge. No scleral icterus.  Neck: Normal range of motion. Neck supple.  Cardiovascular: Normal rate, regular rhythm, normal heart sounds and intact distal pulses.   Pulmonary/Chest: Effort normal and breath sounds normal. No respiratory distress. No wheezes. No rales.  Abdominal: Soft. Bowel sounds are normal. Exhibits no distension and no mass. There is no tenderness.  Musculoskeletal: Normal range of motion. Exhibits no edema.  Lymphadenopathy:    No cervical adenopathy.  Neurological: Alert and oriented to person, place, and time. Exhibits normal muscle tone. Gait normal. Coordination normal.  Skin: Skin is warm and dry. No rash noted. Not diaphoretic. No erythema. No  pallor.  Psychiatric: Mood, memory and judgment normal.  Vitals reviewed.  LABORATORY DATA: Lab Results  Component Value Date   WBC 8.0 12/15/2020   HGB 12.1 12/15/2020   HCT 36.9 12/15/2020   MCV 92.7 12/15/2020   PLT 159 12/15/2020      Chemistry      Component Value Date/Time   NA 145 12/15/2020 0716   K 3.7 12/15/2020 0716   CL 105 12/15/2020 0716   CO2 29 12/15/2020 0716   BUN 10 12/15/2020 0716   CREATININE 0.96 12/15/2020 0716      Component Value Date/Time   CALCIUM 9.8 12/15/2020 0716   ALKPHOS 73 12/15/2020 0716   AST 18 12/15/2020 0716   ALT 9 12/15/2020 0716   BILITOT 0.9 12/15/2020 0716       RADIOGRAPHIC STUDIES:  No results found.   ASSESSMENT/PLAN:  No problem-specific Assessment & Plan notes found for this encounter.   No orders of the defined types were placed in this encounter.    I spent {CHL ONC TIME VISIT - OHYWV:[id number]} counseling the patient face to face. The total time spent in the appointment was {CHL ONC TIME VISIT - NIOEV:[id number]}.  Drezden Seitzinger L  Janelli Welling, PA-C 01/06/21

## 2021-01-07 DIAGNOSIS — Z79899 Other long term (current) drug therapy: Secondary | ICD-10-CM | POA: Diagnosis not present

## 2021-01-07 DIAGNOSIS — C3491 Malignant neoplasm of unspecified part of right bronchus or lung: Secondary | ICD-10-CM | POA: Diagnosis not present

## 2021-01-07 DIAGNOSIS — C3492 Malignant neoplasm of unspecified part of left bronchus or lung: Secondary | ICD-10-CM | POA: Diagnosis not present

## 2021-01-07 DIAGNOSIS — X58XXXA Exposure to other specified factors, initial encounter: Secondary | ICD-10-CM | POA: Diagnosis not present

## 2021-01-07 DIAGNOSIS — I739 Peripheral vascular disease, unspecified: Secondary | ICD-10-CM | POA: Diagnosis not present

## 2021-01-07 DIAGNOSIS — S92532A Displaced fracture of distal phalanx of left lesser toe(s), initial encounter for closed fracture: Secondary | ICD-10-CM | POA: Diagnosis not present

## 2021-01-07 DIAGNOSIS — M62272 Nontraumatic ischemic infarction of muscle, left ankle and foot: Secondary | ICD-10-CM | POA: Diagnosis not present

## 2021-01-07 DIAGNOSIS — I10 Essential (primary) hypertension: Secondary | ICD-10-CM | POA: Diagnosis not present

## 2021-01-08 DIAGNOSIS — C3491 Malignant neoplasm of unspecified part of right bronchus or lung: Secondary | ICD-10-CM | POA: Diagnosis not present

## 2021-01-08 DIAGNOSIS — I739 Peripheral vascular disease, unspecified: Secondary | ICD-10-CM | POA: Diagnosis not present

## 2021-01-08 DIAGNOSIS — M62272 Nontraumatic ischemic infarction of muscle, left ankle and foot: Secondary | ICD-10-CM | POA: Diagnosis not present

## 2021-01-08 DIAGNOSIS — S92532A Displaced fracture of distal phalanx of left lesser toe(s), initial encounter for closed fracture: Secondary | ICD-10-CM | POA: Diagnosis not present

## 2021-01-08 DIAGNOSIS — Z79899 Other long term (current) drug therapy: Secondary | ICD-10-CM | POA: Diagnosis not present

## 2021-01-08 DIAGNOSIS — I10 Essential (primary) hypertension: Secondary | ICD-10-CM | POA: Diagnosis not present

## 2021-01-08 DIAGNOSIS — X58XXXA Exposure to other specified factors, initial encounter: Secondary | ICD-10-CM | POA: Diagnosis not present

## 2021-01-08 DIAGNOSIS — C3492 Malignant neoplasm of unspecified part of left bronchus or lung: Secondary | ICD-10-CM | POA: Diagnosis not present

## 2021-01-09 DIAGNOSIS — M62272 Nontraumatic ischemic infarction of muscle, left ankle and foot: Secondary | ICD-10-CM | POA: Diagnosis not present

## 2021-01-09 DIAGNOSIS — C3491 Malignant neoplasm of unspecified part of right bronchus or lung: Secondary | ICD-10-CM | POA: Diagnosis not present

## 2021-01-09 DIAGNOSIS — I10 Essential (primary) hypertension: Secondary | ICD-10-CM | POA: Diagnosis not present

## 2021-01-09 DIAGNOSIS — I739 Peripheral vascular disease, unspecified: Secondary | ICD-10-CM | POA: Diagnosis not present

## 2021-01-10 ENCOUNTER — Inpatient Hospital Stay: Payer: Medicare Other | Admitting: Physician Assistant

## 2021-01-10 ENCOUNTER — Inpatient Hospital Stay (HOSPITAL_COMMUNITY)
Admission: EM | Admit: 2021-01-10 | Discharge: 2021-01-20 | DRG: 254 | Disposition: A | Discharge status: Skilled Nursing Facility | Payer: Medicare Other | Attending: Vascular Surgery | Admitting: Vascular Surgery

## 2021-01-10 ENCOUNTER — Inpatient Hospital Stay: Payer: Medicare Other

## 2021-01-10 ENCOUNTER — Telehealth: Payer: Self-pay | Admitting: Physician Assistant

## 2021-01-10 ENCOUNTER — Other Ambulatory Visit (HOSPITAL_COMMUNITY): Payer: Self-pay

## 2021-01-10 ENCOUNTER — Other Ambulatory Visit: Payer: Self-pay

## 2021-01-10 DIAGNOSIS — R042 Hemoptysis: Secondary | ICD-10-CM | POA: Diagnosis not present

## 2021-01-10 DIAGNOSIS — R69 Illness, unspecified: Secondary | ICD-10-CM

## 2021-01-10 DIAGNOSIS — R627 Adult failure to thrive: Secondary | ICD-10-CM | POA: Diagnosis not present

## 2021-01-10 DIAGNOSIS — I70262 Atherosclerosis of native arteries of extremities with gangrene, left leg: Principal | ICD-10-CM | POA: Diagnosis present

## 2021-01-10 DIAGNOSIS — Z7982 Long term (current) use of aspirin: Secondary | ICD-10-CM

## 2021-01-10 DIAGNOSIS — I998 Other disorder of circulatory system: Secondary | ICD-10-CM | POA: Diagnosis not present

## 2021-01-10 DIAGNOSIS — J449 Chronic obstructive pulmonary disease, unspecified: Secondary | ICD-10-CM | POA: Diagnosis not present

## 2021-01-10 DIAGNOSIS — I96 Gangrene, not elsewhere classified: Secondary | ICD-10-CM | POA: Diagnosis not present

## 2021-01-10 DIAGNOSIS — I517 Cardiomegaly: Secondary | ICD-10-CM | POA: Diagnosis not present

## 2021-01-10 DIAGNOSIS — Z7902 Long term (current) use of antithrombotics/antiplatelets: Secondary | ICD-10-CM | POA: Diagnosis not present

## 2021-01-10 DIAGNOSIS — L0390 Cellulitis, unspecified: Secondary | ICD-10-CM | POA: Diagnosis not present

## 2021-01-10 DIAGNOSIS — I10 Essential (primary) hypertension: Secondary | ICD-10-CM | POA: Diagnosis not present

## 2021-01-10 DIAGNOSIS — Z20822 Contact with and (suspected) exposure to covid-19: Secondary | ICD-10-CM | POA: Diagnosis not present

## 2021-01-10 DIAGNOSIS — I739 Peripheral vascular disease, unspecified: Secondary | ICD-10-CM | POA: Diagnosis not present

## 2021-01-10 DIAGNOSIS — R000 Tachycardia, unspecified: Secondary | ICD-10-CM | POA: Diagnosis not present

## 2021-01-10 LAB — CBC WITH DIFFERENTIAL/PLATELET
Abs Immature Granulocytes: 0.05 10*3/uL (ref 0.00–0.07)
Basophils Absolute: 0 10*3/uL (ref 0.0–0.1)
Basophils Relative: 0 %
Eosinophils Absolute: 0.1 10*3/uL (ref 0.0–0.5)
Eosinophils Relative: 1 %
HCT: 34.4 % — ABNORMAL LOW (ref 36.0–46.0)
Hemoglobin: 11.6 g/dL — ABNORMAL LOW (ref 12.0–15.0)
Immature Granulocytes: 1 %
Lymphocytes Relative: 17 %
Lymphs Abs: 1.8 10*3/uL (ref 0.7–4.0)
MCH: 30.9 pg (ref 26.0–34.0)
MCHC: 33.7 g/dL (ref 30.0–36.0)
MCV: 91.5 fL (ref 80.0–100.0)
Monocytes Absolute: 0.6 10*3/uL (ref 0.1–1.0)
Monocytes Relative: 6 %
Neutro Abs: 8.3 10*3/uL — ABNORMAL HIGH (ref 1.7–7.7)
Neutrophils Relative %: 75 %
Platelets: 140 10*3/uL — ABNORMAL LOW (ref 150–400)
RBC: 3.76 MIL/uL — ABNORMAL LOW (ref 3.87–5.11)
RDW: 13.6 % (ref 11.5–15.5)
WBC: 10.8 10*3/uL — ABNORMAL HIGH (ref 4.0–10.5)
nRBC: 0 % (ref 0.0–0.2)

## 2021-01-10 LAB — COMPREHENSIVE METABOLIC PANEL
ALT: 22 U/L (ref 0–44)
AST: 36 U/L (ref 15–41)
Albumin: 3.3 g/dL — ABNORMAL LOW (ref 3.5–5.0)
Alkaline Phosphatase: 69 U/L (ref 38–126)
Anion gap: 11 (ref 5–15)
BUN: 5 mg/dL — ABNORMAL LOW (ref 8–23)
CO2: 24 mmol/L (ref 22–32)
Calcium: 9.2 mg/dL (ref 8.9–10.3)
Chloride: 98 mmol/L (ref 98–111)
Creatinine, Ser: 0.97 mg/dL (ref 0.44–1.00)
GFR, Estimated: 59 mL/min — ABNORMAL LOW (ref >60)
Glucose, Bld: 142 mg/dL — ABNORMAL HIGH (ref 70–99)
Potassium: 3.4 mmol/L — ABNORMAL LOW (ref 3.5–5.1)
Sodium: 133 mmol/L — ABNORMAL LOW (ref 135–145)
Total Bilirubin: 1.8 mg/dL — ABNORMAL HIGH (ref 0.3–1.2)
Total Protein: 6.6 g/dL (ref 6.5–8.1)

## 2021-01-10 NOTE — ED Triage Notes (Signed)
Hx obtained from patient's daughter on the phone. Pt dropped a can on her L foot four weeks ago. Since then has had multiple visits for pain and bruising/circulation problems. Was told at [facility] that she needed to see a vascular surgeon. Hx lung CA, taking radiation pill every morning. Call daughter [name] (number in chart) if any questions/updates.

## 2021-01-10 NOTE — Telephone Encounter (Signed)
R/s appts per 7/5 sch msg. Pt preferred to move appts to next week. I moved appts to 7/11 per pt preference. Pt aware.

## 2021-01-10 NOTE — ED Provider Notes (Signed)
Emergency Medicine Provider Triage Evaluation Note  Heather [name] , a 80 y.o. female  was evaluated in triage.  Pt complains of left foot pain.  Needs evaluation by vascular surgeon, however they are unable to do this in [place].  Prior history of lung CA.  Worsening pain to the left small toe.No trauma.  Review of Systems  Positive: Wound, foot pain Negative: Fever, trauma  Physical Exam  BP (!) 154/102   Pulse (!) 102   Temp 98.2 F (36.8 C) (Oral)   Resp 18   SpO2 96%  Gen:   Awake, no distress   Resp:  Normal effort  MSK:   Moves extremities without difficulty  Other:  Pulses obtained via Doppler.  Sensation is intact throughout.  Discoloration noted to the left small toe suspicion for ischemia, this is known from her chart.  Medical Decision Making  Medically screening exam initiated at 7:25 PM.  Appropriate orders placed.  Heather [name] was informed that the remainder of the evaluation will be completed by another provider, this initial triage assessment does not replace that evaluation, and the importance of remaining in the ED until their evaluation is complete.     Janeece Fitting, PA-C 01/10/21 Loleta Rose, MD 01/10/21 1958

## 2021-01-11 ENCOUNTER — Emergency Department (HOSPITAL_COMMUNITY): Payer: Medicare Other

## 2021-01-11 ENCOUNTER — Observation Stay (HOSPITAL_BASED_OUTPATIENT_CLINIC_OR_DEPARTMENT_OTHER): Payer: Medicare Other

## 2021-01-11 DIAGNOSIS — L0390 Cellulitis, unspecified: Secondary | ICD-10-CM | POA: Diagnosis not present

## 2021-01-11 DIAGNOSIS — I70262 Atherosclerosis of native arteries of extremities with gangrene, left leg: Secondary | ICD-10-CM | POA: Diagnosis present

## 2021-01-11 LAB — COMPREHENSIVE METABOLIC PANEL
ALT: 23 U/L (ref 0–44)
AST: 38 U/L (ref 15–41)
Albumin: 3.1 g/dL — ABNORMAL LOW (ref 3.5–5.0)
Alkaline Phosphatase: 67 U/L (ref 38–126)
Anion gap: 12 (ref 5–15)
BUN: 7 mg/dL — ABNORMAL LOW (ref 8–23)
CO2: 23 mmol/L (ref 22–32)
Calcium: 9.3 mg/dL (ref 8.9–10.3)
Chloride: 103 mmol/L (ref 98–111)
Creatinine, Ser: 0.87 mg/dL (ref 0.44–1.00)
GFR, Estimated: >60 mL/min (ref >60)
Glucose, Bld: 144 mg/dL — ABNORMAL HIGH (ref 70–99)
Potassium: 3.6 mmol/L (ref 3.5–5.1)
Sodium: 138 mmol/L (ref 135–145)
Total Bilirubin: 1.4 mg/dL — ABNORMAL HIGH (ref 0.3–1.2)
Total Protein: 6.5 g/dL (ref 6.5–8.1)

## 2021-01-11 LAB — LACTIC ACID, PLASMA
Lactic Acid, Venous: 3.5 mmol/L (ref 0.5–1.9)
Lactic Acid, Venous: 2.4 mmol/L (ref 0.5–1.9)

## 2021-01-11 LAB — CBC
HCT: 34.1 % — ABNORMAL LOW (ref 36.0–46.0)
Hemoglobin: 11.1 g/dL — ABNORMAL LOW (ref 12.0–15.0)
MCH: 30.6 pg (ref 26.0–34.0)
MCHC: 32.6 g/dL (ref 30.0–36.0)
MCV: 93.9 fL (ref 80.0–100.0)
Platelets: 120 10*3/uL — ABNORMAL LOW (ref 150–400)
RBC: 3.63 MIL/uL — ABNORMAL LOW (ref 3.87–5.11)
RDW: 13.9 % (ref 11.5–15.5)
WBC: 10.7 10*3/uL — ABNORMAL HIGH (ref 4.0–10.5)
nRBC: 0 % (ref 0.0–0.2)

## 2021-01-11 LAB — PROTIME-INR
INR: 1.4 — ABNORMAL HIGH (ref 0.8–1.2)
Prothrombin Time: 17.1 seconds — ABNORMAL HIGH (ref 11.4–15.2)

## 2021-01-11 MED ORDER — LACTATED RINGERS IV SOLN: INTRAVENOUS | Status: DC

## 2021-01-11 MED ORDER — POTASSIUM CHLORIDE CRYS ER 20 MEQ PO TBCR
20.0000 meq | EXTENDED_RELEASE_TABLET | Freq: Once | ORAL | Status: AC
  Administered 2021-01-11: 40 meq via ORAL
  Filled 2021-01-11: qty 2

## 2021-01-11 MED ORDER — PHENOL 1.4 % MT LIQD: 1.0000 | OROMUCOSAL | Status: DC | PRN

## 2021-01-11 MED ORDER — LABETALOL HCL 5 MG/ML IV SOLN
10.0000 mg | INTRAVENOUS | Status: DC | PRN
Start: 2021-01-11 — End: 2021-01-12

## 2021-01-11 MED ORDER — ONDANSETRON HCL 4 MG/2ML IJ SOLN
4.0000 mg | Freq: Four times a day (QID) | INTRAMUSCULAR | Status: DC | PRN
  Administered 2021-01-11 – 2021-01-20 (×4): 4 mg via INTRAVENOUS
  Filled 2021-01-11 (×3): qty 2

## 2021-01-11 MED ORDER — METOPROLOL TARTRATE 5 MG/5ML IV SOLN
5.0000 mg | Freq: Once | INTRAVENOUS | Status: AC
  Administered 2021-01-11: 5 mg via INTRAVENOUS
  Filled 2021-01-11: qty 5

## 2021-01-11 MED ORDER — GUAIFENESIN-DM 100-10 MG/5ML PO SYRP
15.0000 mL | ORAL_SOLUTION | ORAL | Status: DC | PRN
  Administered 2021-01-12: 15 mL via ORAL
  Filled 2021-01-11: qty 15

## 2021-01-11 MED ORDER — ATORVASTATIN CALCIUM 40 MG PO TABS
40.0000 mg | ORAL_TABLET | Freq: Every day | ORAL | Status: DC
  Administered 2021-01-11 – 2021-01-20 (×10): 40 mg via ORAL
  Filled 2021-01-11 (×10): qty 1

## 2021-01-11 MED ORDER — OSIMERTINIB MESYLATE 80 MG PO TABS
80.0000 mg | ORAL_TABLET | Freq: Every day | ORAL | Status: DC
  Administered 2021-01-11 – 2021-01-20 (×8): 80 mg via ORAL
  Filled 2021-01-11 (×13): qty 1

## 2021-01-11 MED ORDER — HYDRALAZINE HCL 20 MG/ML IJ SOLN: 5.0000 mg | INTRAMUSCULAR | Status: DC | PRN

## 2021-01-11 MED ORDER — METOPROLOL TARTRATE 5 MG/5ML IV SOLN: 2.0000 mg | INTRAVENOUS | Status: DC | PRN

## 2021-01-11 MED ORDER — ACETAMINOPHEN 325 MG PO TABS
650.0000 mg | ORAL_TABLET | Freq: Four times a day (QID) | ORAL | Status: DC
  Administered 2021-01-11 – 2021-01-20 (×24): 650 mg via ORAL
  Filled 2021-01-11 (×29): qty 2

## 2021-01-11 MED ORDER — ALUM & MAG HYDROXIDE-SIMETH 200-200-20 MG/5ML PO SUSP
15.0000 mL | ORAL | Status: DC | PRN
  Administered 2021-01-18: 30 mL via ORAL
  Filled 2021-01-11: qty 30

## 2021-01-11 MED ORDER — HEPARIN SODIUM (PORCINE) 5000 UNIT/ML IJ SOLN
5000.0000 [IU] | Freq: Three times a day (TID) | INTRAMUSCULAR | Status: DC
  Administered 2021-01-11 – 2021-01-20 (×24): 5000 [IU] via SUBCUTANEOUS
  Filled 2021-01-11 (×24): qty 1

## 2021-01-11 MED ORDER — MORPHINE SULFATE (PF) 4 MG/ML IV SOLN
4.0000 mg | Freq: Once | INTRAVENOUS | Status: AC
  Administered 2021-01-11: 4 mg via INTRAVENOUS
  Filled 2021-01-11: qty 1

## 2021-01-11 MED ORDER — ASPIRIN EC 81 MG PO TBEC
81.0000 mg | DELAYED_RELEASE_TABLET | Freq: Every day | ORAL | Status: DC
  Administered 2021-01-11 – 2021-01-20 (×10): 81 mg via ORAL
  Filled 2021-01-11 (×10): qty 1

## 2021-01-11 MED ORDER — METOPROLOL SUCCINATE ER 25 MG PO TB24
25.0000 mg | ORAL_TABLET | Freq: Two times a day (BID) | ORAL | Status: DC
  Administered 2021-01-11 – 2021-01-20 (×19): 25 mg via ORAL
  Filled 2021-01-11 (×19): qty 1

## 2021-01-11 MED ORDER — OXYCODONE HCL 5 MG PO TABS
5.0000 mg | ORAL_TABLET | ORAL | Status: DC | PRN
  Administered 2021-01-13 – 2021-01-14 (×2): 10 mg via ORAL
  Administered 2021-01-14: 5 mg via ORAL
  Administered 2021-01-15 – 2021-01-19 (×7): 10 mg via ORAL
  Filled 2021-01-11 (×3): qty 2
  Filled 2021-01-11: qty 1
  Filled 2021-01-11 (×8): qty 2

## 2021-01-11 MED ORDER — PANTOPRAZOLE SODIUM 40 MG PO TBEC
40.0000 mg | DELAYED_RELEASE_TABLET | Freq: Every day | ORAL | Status: DC
  Administered 2021-01-11 – 2021-01-20 (×10): 40 mg via ORAL
  Filled 2021-01-11 (×10): qty 1

## 2021-01-11 NOTE — ED Provider Notes (Signed)
[facility] EMERGENCY DEPARTMENT Provider Note   CSN: [id number] Arrival date & time: 01/10/21  1855     History Chief Complaint  Patient presents with   Circulatory Problem    Heather [name] is a 80 y.o. female.  HPI Patient was admitted 6\24 through 6\27 at [facility].  She was admitted for concern for ischemic toe and cellulitis.  CTA with runoff performed without significant stenosis however bilateral ABIs poor circulation in the left foot.  Patient was to schedule follow-up with [facility] vascular surgery for revascularization however not deemed emergent at time of her hospitalization.  Patient reports that she has had severe pain and increasing discoloration of her toes.  She has not been able to make her appointment with [facility] vascular surgery and is presenting to [facility] for increasing and worsening pain with ischemic left toes.  She reports part of the foot feels numb along the lateral aspect but also pain in the toes.  She reports she is only taking Tylenol for pain.  She denies she had fever or chills that she is aware of.  She does have history of non-small cell lung cancer with recurrent disease in the left upper lobe.  Distant history of right upper lobectomy with lymph node resection 2013.  No recent stereotactic radiation therapy left upper lobe February 2019.  Patient reports she does have some occasional cough.  She reports she has had a little bit of cough more recently with some light blood streaking.    Past Medical History:  Diagnosis Date   Arthritis    reports her joints are fine and she has no pain    Ascending aortic aneurysm ([place]) 07/20/2014   4.1 cm by CT   Atrial fib/flutter, transient    post-op   CHF (congestive heart failure) ([place])    a. EF 30-35% by echo in 2013 --> similar results by repeat imaging in 2016 and 11/2017.   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) 08/01/2017   History of adenomatous polyp of colon 02/06/2017   Ascending aortic aneurysm ([place]) 07/20/2014   Non-small cell carcinoma of right lung, stage 1 ([place]) 07/20/2014   Constipation 01/07/2012   Encounter for screening colonoscopy 01/07/2012   Tobacco abuse    Lung nodule 08/21/2011    Past Surgical History:  Procedure Laterality Date   CATARACT EXTRACTION Bilateral 2016   COLONOSCOPY  01/22/2012   Procedure: COLONOSCOPY;  Surgeon: Danie Binder, MD;  [facility];  Service: Endoscopy;  Laterality: N/A;  10:45   COLONOSCOPY N/A 04/15/2017   Four 4-6 mm polyps in descending colon, ascending, and cecum. One 15 mm polyp in proximal ascending colon, one 4 mm poylp at hepatic flexure, two 3-4 mm polyps  in sigmoid and descending. Internal hemorrhoids. Colonoscopy in 3-5 years if benefits outweigh risks. Simple adenomas.    IR RADIOLOGIST EVAL & MGMT  03/04/2018   POLYPECTOMY  04/15/2017   Procedure: POLYPECTOMY;  Surgeon: Danie Binder, MD;  [facility];  Service: Endoscopy;;  ascending colon   RADIOLOGY WITH ANESTHESIA N/A 03/19/2018   Procedure: CT MICROWAVE THERMAL ABLATION WITH ANESTHESIA;  Surgeon: Jacqulynn Cadet, MD;  [facility];  Service: Anesthesiology;  Laterality: N/A;   right VATS (upper lobectomy)  08/29/11   Dr Roxan Hockey   VIDEO BRONCHOSCOPY WITH ENDOBRONCHIAL NAVIGATION  N/A 03/18/2015   Procedure: VIDEO BRONCHOSCOPY WITH ENDOBRONCHIAL NAVIGATION;  Surgeon: [name], MD;  Location: [facility];  Service: Thoracic;  Laterality: N/A;   VIDEO BRONCHOSCOPY WITH ENDOBRONCHIAL NAVIGATION N/A 11/17/2020   Procedure: VIDEO BRONCHOSCOPY WITH ENDOBRONCHIAL NAVIGATION;  Surgeon: [name], MD;  Location: [place];  Service: Thoracic;  Laterality: N/A;     OB History   No obstetric history on file.     Family History  Problem Relation Age of Onset   Hypertension Mother    Heart disease Mother    Heart disease Sister    Heart disease Brother    Colon cancer Neg Hx     Social History   Tobacco Use   Smoking status: Some Days    Packs/day: 0.30    Years: 20.00    Pack years: 6.00    Types: Cigarettes    Last attempt to quit: 01/12/2014    Years since quitting: 7.0   Smokeless tobacco: Never   Tobacco comments:    11/16/20: Per pt, she smokes 2pk per week.  Vaping Use   Vaping Use: Never used  Substance Use Topics   Alcohol use: Yes    Comment: occasional    Drug use: No    Home Medications Prior to Admission medications   Medication Sig Start Date End Date Taking? Authorizing Provider  acetaminophen (TYLENOL) 500 MG tablet Take 500 mg by mouth every 6 (six) hours as needed for mild pain.   Yes [provider]  alendronate (FOSAMAX) 70 MG tablet Take 70 mg by mouth every Monday.  07/06/14  Yes [provider]  Calcium Carb-Cholecalciferol (OYSTER SHELL CALCIUM W/D) 500-200 MG-UNIT TABS Take 1 tablet by mouth 3 (three) times daily. 09/22/20  Yes [provider]  cephALEXin (KEFLEX) 500 MG capsule Take 500 mg by mouth 2 (two) times daily. 12/30/20  Yes [provider]  glucosamine-chondroitin 500-400 MG tablet Take 1 tablet by mouth daily.   Yes [provider]  hydrochlorothiazide (MICROZIDE) 12.5 MG capsule Take 6.25 mg by mouth daily at 2 PM. 03/05/17  Yes [provider]   HYDROcodone-acetaminophen (NORCO/VICODIN) 5-325 MG tablet Take 1 tablet by mouth 4 (four) times daily as needed for pain. 01/09/21  Yes [provider]  meclizine (ANTIVERT) 12.5 MG tablet Take 12.5 mg by mouth daily as needed for dizziness.   Yes [provider]  Multiple Vitamins-Iron (MULTIVITAMINS WITH IRON) TABS Take 1 tablet by mouth daily.   Yes [provider]  Omega-3 Fatty Acids (FISH OIL) 500 MG CAPS Take 500 mg by mouth daily.    Yes [provider]  osimertinib mesylate (TAGRISSO) 80 MG tablet Take 1 tablet (80 mg total) by mouth daily. 12/15/20  Yes Curt Bears, MD  vitamin B-12 (CYANOCOBALAMIN) 500 MCG tablet Take 500 mcg by mouth daily.   Yes [provider]  metoprolol succinate (  TOPROL XL) 25 MG 24 hr tablet Take 1 tablet (25 mg total) by mouth 2 (two) times daily. Patient taking differently: Take 25 mg by mouth daily. 11/18/17   Herminio Commons, MD    Allergies    Ibuprofen and Powder  Review of Systems   Review of Systems 10 systems reviewed and negative except as per HPI Physical Exam Updated Vital Signs BP (!) 147/95   Pulse (!) 101   Temp 98 F (36.7 C)   Resp 17   SpO2 95%   Physical Exam Constitutional:      Comments: Patient is alert.  Mental status clear.  Mild increased work of breathing.  Fatigued in appearance.  Slightly pale.  HENT:     Mouth/Throat:     Pharynx: Oropharynx is clear.  Eyes:     Extraocular Movements: Extraocular movements intact.  Cardiovascular:     Comments: Borderline tachycardia with ectopy. Bilateral femoral pulses 2+ and strong.  Pedal pulses palpable on the right foot.  Pedal pulses on left foot are present by hand-held Doppler. Pulmonary:     Comments: Mild increased work of breathing.  No gross wheeze or crackle.  Breath sounds slightly different left to right but both present was suspected transmitted right-sided breath sounds given patient's history of  lobectomy. Abdominal:     General: There is no distension.     Palpations: Abdomen is soft.     Tenderness: There is no abdominal tenderness. There is no guarding.  Musculoskeletal:     Comments: See attached images for ischemic appearing toes on left foot.  Left foot does feel cooler than right foot on the forefoot and midfoot.  Pulses on left foot are present by hand-held Doppler.  Pulses on right foot are present by manual palpation.  Skin:    General: Skin is warm and dry.  Neurological:     General: No focal deficit present.     Mental Status: She is oriented to person, place, and time.     Coordination: Coordination normal.  Psychiatric:        Mood and Affect: Mood normal.      ED Results / Procedures / Treatments   Labs (all labs ordered are listed, but only abnormal results are displayed) Labs Reviewed  CBC WITH DIFFERENTIAL/PLATELET - Abnormal; Notable for the following components:      Result Value   WBC 10.8 (*)    RBC 3.76 (*)    Hemoglobin 11.6 (*)    HCT 34.4 (*)    Platelets 140 (*)    Neutro Abs 8.3 (*)    All other components within normal limits  COMPREHENSIVE METABOLIC PANEL - Abnormal; Notable for the following components:   Sodium 133 (*)    Potassium 3.4 (*)    Glucose, Bld 142 (*)    BUN 5 (*)    Albumin 3.3 (*)    Total Bilirubin 1.8 (*)    GFR, Estimated 59 (*)    All other components within normal limits  CULTURE, BLOOD (ROUTINE X 2)  CULTURE, BLOOD (ROUTINE X 2)  LACTIC ACID, PLASMA  LACTIC ACID, PLASMA  CBC  COMPREHENSIVE METABOLIC PANEL  PROTIME-INR  URINALYSIS, ROUTINE W REFLEX MICROSCOPIC    EKG EKG Interpretation  Date/Time:  Wednesday January 11 2021 07:53:19 EDT Ventricular Rate:  100 PR Interval:  158 QRS Duration: 102 QT Interval:  401 QTC Calculation: 507 R Axis:   9 Text Interpretation: Sinus tachycardia Multiple premature complexes, vent & supraven  Nonspecific T abnormalities, lateral leads agree, increased PVC  otherwise similar to previous Confirmed by Charlesetta Shanks [phone]) on 01/11/2021 9:26:22 AM  Radiology DG Chest 2 View  Result Date: 01/11/2021 CLINICAL DATA:  Hemoptysis.  History of lung cancer EXAM: CHEST - 2 VIEW COMPARISON:  11/15/2020 FINDINGS: Cardiomegaly. Stable aortic and hilar contours. Interstitial coarsening with chronic patchy bilateral pulmonary opacity greatest at the left apex and right base. No Kerley lines, effusion, or pneumothorax. IMPRESSION: 1. No localized source of hemoptysis. 2. Chronic cardiomegaly and bilateral pulmonary opacities. Electronically Signed   By: Monte Fantasia M.D.   On: 01/11/2021 04:27    Procedures Procedures   Medications Ordered in ED Medications  lactated ringers infusion ( Intravenous New Bag/Given 01/11/21 0933)  osimertinib mesylate (TAGRISSO) tablet 80 mg (has no administration in time range)  metoprolol succinate (TOPROL-XL) 24 hr tablet 25 mg (has no administration in time range)  potassium chloride SA (KLOR-CON) CR tablet 20-40 mEq (has no administration in time range)  ondansetron (ZOFRAN) injection 4 mg (has no administration in time range)  alum & mag hydroxide-simeth (MAALOX/MYLANTA) 200-200-20 MG/5ML suspension 15-30 mL (has no administration in time range)  pantoprazole (PROTONIX) EC tablet 40 mg (has no administration in time range)  labetalol (NORMODYNE) injection 10 mg (has no administration in time range)  hydrALAZINE (APRESOLINE) injection 5 mg (has no administration in time range)  metoprolol tartrate (LOPRESSOR) injection 2-5 mg (has no administration in time range)  guaiFENesin-dextromethorphan (ROBITUSSIN DM) 100-10 MG/5ML syrup 15 mL (has no administration in time range)  phenol (CHLORASEPTIC) mouth spray 1 spray (has no administration in time range)  heparin injection 5,000 Units (has no administration in time range)  aspirin EC tablet 81 mg (has no administration in time range)  atorvastatin (LIPITOR) tablet 40 mg (has no  administration in time range)  acetaminophen (TYLENOL) tablet 650 mg (has no administration in time range)  oxyCODONE (Oxy IR/ROXICODONE) immediate release tablet 5-10 mg (has no administration in time range)  morphine 4 MG/ML injection 4 mg (4 mg Intravenous Given 01/11/21 0933)  metoprolol tartrate (LOPRESSOR) injection 5 mg (5 mg Intravenous Given 01/11/21 0930)    ED Course  I have reviewed the triage vital signs and the nursing notes.  Pertinent labs & imaging results that were available during my care of the patient were reviewed by me and considered in my medical decision making (see chart for details).    MDM Rules/Calculators/A&P                          Consult: Dr. Stanford Breed vascular surgery.  Excepting the patient for admission.  Patient presents as outlined.  She had initial treatment at [facility] for cellulitis and ischemic toe.  Pain is worsening and patient presents for definitive treatment.  Patient has medical comorbidity of lung cancer.  At this time she is nontoxic with clear mental status.  No acute respiratory failure.  She has 2 ischemic digits on the left foot.  Care initiated in the emergency department with fluids and pain control and consultation to vascular surgery. Final Clinical Impression(s) / ED Diagnoses Final diagnoses:  Peripheral arterial disease (HCC)  Ischemic necrosis of toe ([place])  Severe comorbid illness    Rx / DC Orders ED Discharge Orders     None        Charlesetta Shanks, MD 01/11/21 1115

## 2021-01-11 NOTE — Progress Notes (Signed)
VASCULAR LAB    ABIs have been performed.  See CV proc for preliminary results.   Tino Ronan, RVT 01/11/2021, 5:25 PM

## 2021-01-11 NOTE — H&P (Signed)
[facility] [place]  ASSESSMENT / PLAN: Heather [name] is a 80 y.o. female with atherosclerosis of native arteries of left lower extremity causing gangrene.  Patient counseled patients with chronic limb threatening ischemia have an annual risk of cardiovascular mortality of 25% and a annual amputation risk of 25%. Aggressive risk factor modification and intervention for limb salvage is indicated..  Recommend the following which can slow the progression of atherosclerosis and reduce the risk of major adverse cardiac / limb events:  Complete cessation from all tobacco products. Blood glucose control with goal A1c < 7%. Blood pressure control with goal blood pressure < 140/90 mmHg. Lipid reduction therapy with goal LDL-C <100 mg/dL (<70 if symptomatic from PAD).  Aspirin 81mg  PO QD.  Atorvastatin 40-80mg  PO QD (or other "high intensity" statin therapy).  Plan left lower extremity angiogram with possible intervention via right common femoral artery access approach in cath lab tomorrow with Dr. Carlis Abbott. Will admit for observation until procedure.   CHIEF COMPLAINT: left foot gangrene  HISTORY OF PRESENT ILLNESS: Heather [name] is a 80 y.o. female who presents to [facility] emergency room for evaluation of left fourth and fifth toe discoloration.  She reports this is been present for more than 4 weeks.  The left foot is quite painful to her.  She was seen in an outside facility and referred to [facility] vascular surgery, but did not want to wait for her appointment.  She is a former smoker.  She has history of lung cancer.  Past Medical History:  Diagnosis Date   Arthritis    reports her joints are fine and she has no pain    Ascending aortic aneurysm ([place]) 07/20/2014   4.1 cm by CT   Atrial fib/flutter, transient    post-op   CHF (congestive heart failure) ([place])    a. EF 30-35% by echo in 2013 --> similar results by repeat imaging in 2016 and 11/2017.   Constipation     corrects with diet   Dysrhythmia    Floater, vitreous    Right   Hypertension    Nonsquamous nonsmall cell neoplasm of lung ([place]) 09/2011   Stage IB- s/p right upper lobectomy   Pneumonia    Psoriasis    Ankles   Tobacco abuse     Past Surgical History:  Procedure Laterality Date   CATARACT EXTRACTION Bilateral 2016   COLONOSCOPY  01/22/2012   Procedure: COLONOSCOPY;  Surgeon: Danie Binder, MD;  [facility];  Service: Endoscopy;  Laterality: N/A;  10:45   COLONOSCOPY N/A 04/15/2017   Four 4-6 mm polyps in descending colon, ascending, and cecum. One 15 mm polyp in proximal ascending colon, one 4 mm poylp at hepatic flexure, two 3-4 mm polyps in sigmoid and descending. Internal hemorrhoids. Colonoscopy in 3-5 years if benefits outweigh risks. Simple adenomas.    IR RADIOLOGIST EVAL & MGMT  03/04/2018   POLYPECTOMY  04/15/2017   Procedure: POLYPECTOMY;  Surgeon: Danie Binder, MD;  Location: [facility];  Service: Endoscopy;;  ascending colon   RADIOLOGY WITH ANESTHESIA N/A 03/19/2018   Procedure: CT MICROWAVE THERMAL ABLATION WITH ANESTHESIA;  Surgeon: Jacqulynn Cadet, MD;  Location: [facility];  Service: Anesthesiology;  Laterality: N/A;   right VATS (upper lobectomy)  08/29/11   Dr Roxan Hockey   VIDEO BRONCHOSCOPY WITH ENDOBRONCHIAL NAVIGATION N/A 03/18/2015   Procedure: VIDEO BRONCHOSCOPY WITH ENDOBRONCHIAL NAVIGATION;  Surgeon: [name], MD;  Location: [place];  Service:  Thoracic;  Laterality: N/A;   VIDEO BRONCHOSCOPY WITH ENDOBRONCHIAL NAVIGATION N/A 11/17/2020   Procedure: VIDEO BRONCHOSCOPY WITH ENDOBRONCHIAL NAVIGATION;  Surgeon: [name], MD;  Location: [facility] OR;  Service: Thoracic;  Laterality: N/A;    Family History  Problem Relation Age of Onset   Hypertension Mother    Heart disease Mother    Heart disease Sister    Heart disease Brother    Colon cancer Neg Hx     Social History   Socioeconomic History   Marital status: Widowed     Spouse name: Not on file   Number of children: 3   Years of education: Not on file   Highest education level: Not on file  Occupational History   Occupation: retired  Tobacco Use   Smoking status: Some Days    Packs/day: 0.30    Years: 20.00    Pack years: 6.00    Types: Cigarettes    Last attempt to quit: 01/12/2014    Years since quitting: 7.0   Smokeless tobacco: Never   Tobacco comments:    11/16/20: Per pt, she smokes 2pk per week.  Vaping Use   Vaping Use: Never used  Substance and Sexual Activity   Alcohol use: Yes    Comment: occasional    Drug use: No   Sexual activity: Not on file  Other Topics Concern   Not on file  Social History Narrative   Lives w/ cousin   Social Determinants of Health   Financial Resource Strain: Not on file  Food Insecurity: Not on file  Transportation Needs: Not on file  Physical Activity: Not on file  Stress: Not on file  Social Connections: Not on file  Intimate Partner Violence: Not on file    Allergies  Allergen Reactions   Ibuprofen Nausea And Vomiting   Powder Rash    Gloves with powder    Current Facility-Administered Medications  Medication Dose Route Frequency Provider Last Rate Last Admin   lactated ringers infusion   Intravenous Continuous Charlesetta Shanks, MD 125 mL/hr at 01/11/21 0933 New Bag at 01/11/21 0933   Current Outpatient Medications  Medication Sig Dispense Refill   acetaminophen (TYLENOL) 500 MG tablet Take 500 mg by mouth every 6 (six) hours as needed for mild pain.     alendronate (FOSAMAX) 70 MG tablet Take 70 mg by mouth every Monday.   3   Calcium Carb-Cholecalciferol (OYSTER SHELL CALCIUM W/D) 500-200 MG-UNIT TABS Take 1 tablet by mouth 3 (three) times daily.     cephALEXin (KEFLEX) 500 MG capsule Take 500 mg by mouth 2 (two) times daily.     glucosamine-chondroitin 500-400 MG tablet Take 1 tablet by mouth daily.     hydrochlorothiazide (MICROZIDE) 12.5 MG capsule Take 6.25 mg by mouth daily at 2 PM.   3   HYDROcodone-acetaminophen (NORCO/VICODIN) 5-325 MG tablet Take 1 tablet by mouth 4 (four) times daily as needed for pain.     meclizine (ANTIVERT) 12.5 MG tablet Take 12.5 mg by mouth daily as needed for dizziness.     Multiple Vitamins-Iron (MULTIVITAMINS WITH IRON) TABS Take 1 tablet by mouth daily.     Omega-3 Fatty Acids (FISH OIL) 500 MG CAPS Take 500 mg by mouth daily.      osimertinib mesylate (TAGRISSO) 80 MG tablet Take 1 tablet (80 mg total) by mouth daily. 30 tablet 3   vitamin B-12 (CYANOCOBALAMIN) 500 MCG tablet Take 500 mcg by mouth daily.     metoprolol succinate (  TOPROL XL) 25 MG 24 hr tablet Take 1 tablet (25 mg total) by mouth 2 (two) times daily. (Patient taking differently: Take 25 mg by mouth daily.) 60 tablet 6    REVIEW OF SYSTEMS:  [X]  denotes positive finding, [ ]  denotes negative finding Cardiac  Comments:  Chest pain or chest pressure:    Shortness of breath upon exertion:    Short of breath when lying flat:    Irregular heart rhythm:        Vascular    Pain in calf, thigh, or hip brought on by ambulation:    Pain in feet at night that wakes you up from your sleep:     Blood clot in your veins:    Leg swelling:         Pulmonary    Oxygen at home:    Productive cough:     Wheezing:         Neurologic    Sudden weakness in arms or legs:     Sudden numbness in arms or legs:     Sudden onset of difficulty speaking or slurred speech:    Temporary loss of vision in one eye:     Problems with dizziness:         Gastrointestinal    Blood in stool:     Vomited blood:         Genitourinary    Burning when urinating:     Blood in urine:        Psychiatric    Major depression:         Hematologic    Bleeding problems:    Problems with blood clotting too easily:        Skin    Rashes or ulcers:        Constitutional    Fever or chills:      PHYSICAL EXAM Vitals:   01/11/21 0800 01/11/21 0900 01/11/21 1000 01/11/21 1015  BP: (!) 171/91  (!) 156/94 (!) 147/95   Pulse: (!) 129 (!) 106 (!) 101   Resp: 15 18 17    Temp: 98 F (36.7 C)     TempSrc:      SpO2: 96% 96%  95%    Constitutional: elderly, chronically ill appearing. no distress. Appears well nourished.  Neurologic: CN intact. no focal findings. no sensory loss. Psychiatric:  Mood and affect symmetric and appropriate. Eyes:  No icterus. No conjunctival pallor. Ears, nose, throat:  mucous membranes moist. Midline trachea.  Cardiac: regular rate and rhythm.  Respiratory:  unlabored. Abdominal:  soft, non-tender, non-distended.  Peripheral vascular: 2+ femoral pulses bilaterally  2+ R DP pules  Absent L DP pulse  Doppler flow in L PT Extremity: No edema. No cyanosis. No pallor.  Skin:    Lymphatic: No Stemmer's sign. No palpable lymphadenopathy.  PERTINENT LABORATORY AND RADIOLOGIC DATA  Most recent CBC CBC Latest Ref Rng & Units 01/10/2021 12/15/2020 11/16/2020  WBC 4.0 - 10.5 K/uL 10.8(H) 8.0 8.7  Hemoglobin 12.0 - 15.0 g/dL 11.6(L) 12.1 12.3  Hematocrit 36.0 - 46.0 % 34.4(L) 36.9 37.1  Platelets 150 - 400 K/uL 140(L) 159 176     Most recent CMP CMP Latest Ref Rng & Units 01/10/2021 12/15/2020 11/16/2020  Glucose 70 - 99 mg/dL 142(H) 100(H) 96  BUN 8 - 23 mg/dL 5(L) 10 17  Creatinine 0.44 - 1.00 mg/dL 0.97 0.96 1.16(H)  Sodium 135 - 145 mmol/L 133(L) 145 144  Potassium 3.5 - 5.1 mmol/L 3.4(L)  3.7 3.7  Chloride 98 - 111 mmol/L 98 105 106  CO2 22 - 32 mmol/L 24 29 30   Calcium 8.9 - 10.3 mg/dL 9.2 9.8 10.2  Total Protein 6.5 - 8.1 g/dL 6.6 7.1 7.2  Total Bilirubin 0.3 - 1.2 mg/dL 1.8(H) 0.9 0.7  Alkaline Phos 38 - 126 U/L 69 73 71  AST 15 - 41 U/L 36 18 22  ALT 0 - 44 U/L 22 9 11     Renal function CrCl cannot be calculated (Unknown ideal weight.).  No results found for: HGBA1C  LDL Cholesterol  Date Value Ref Range Status  10/23/2019 96 0 - 99 mg/dL Final    Comment:           Total Cholesterol/HDL:CHD Risk Coronary Heart Disease Risk Table                      Men   Women  1/2 Average Risk   3.4   3.3  Average Risk       5.0   4.4  2 X Average Risk   9.6   7.1  3 X Average Risk  23.4   11.0        Use the calculated Patient Ratio above and the CHD Risk Table to determine the patient's CHD Risk.        ATP III CLASSIFICATION (LDL):  <100     mg/dL   Optimal  100-129  mg/dL   Near or Above                    Optimal  130-159  mg/dL   Borderline  160-189  mg/dL   High  >190     mg/dL   Very High Performed at [facility], [address] [place], [place] [address]     [name] Stanford Breed, MD [facility] Phone Number: [phone] 01/11/2021 10:54 AM

## 2021-01-11 NOTE — ED Provider Notes (Addendum)
  3:46 AM Patient still in lobby, now complains of spitting up blood.  Initially checked in due to left foot pain.  She reports feeling nauseated.  She is not sure if it is blood in phlegm from cough or vomit.  Repeat VS taken and are stable.  She does have history of lung cancer.  Will obtain CXR now.  Labs reviewed, mild anemia but this appears chronic, normal platelets.   Larene Pickett, PA-C 01/11/21 0349    Larene Pickett, PA-C 01/11/21 0350    Fatima Blank, MD 01/11/21 [phone]

## 2021-01-11 NOTE — ED Notes (Signed)
Attempted to call report x 1  

## 2021-01-12 ENCOUNTER — Encounter (HOSPITAL_COMMUNITY)
Admission: EM | Disposition: A | Discharge status: Skilled Nursing Facility | Payer: Self-pay | Source: Home / Self Care | Attending: Vascular Surgery

## 2021-01-12 ENCOUNTER — Ambulatory Visit (HOSPITAL_COMMUNITY): Admission: RE | Admit: 2021-01-12 | Payer: Medicare Other | Source: Home / Self Care | Admitting: Vascular Surgery

## 2021-01-12 DIAGNOSIS — I998 Other disorder of circulatory system: Secondary | ICD-10-CM

## 2021-01-12 HISTORY — PX: PERIPHERAL VASCULAR INTERVENTION: CATH118257

## 2021-01-12 HISTORY — PX: ABDOMINAL AORTOGRAM W/LOWER EXTREMITY: CATH118223

## 2021-01-12 LAB — SURGICAL PCR SCREEN
MRSA, PCR: NEGATIVE
Staphylococcus aureus: NEGATIVE

## 2021-01-12 LAB — POCT ACTIVATED CLOTTING TIME
Activated Clotting Time: 219 seconds
Activated Clotting Time: 196 seconds

## 2021-01-12 LAB — SARS CORONAVIRUS 2 (TAT 6-24 HRS): SARS Coronavirus 2: NEGATIVE

## 2021-01-12 SURGERY — ABDOMINAL AORTOGRAM W/LOWER EXTREMITY
Anesthesia: LOCAL

## 2021-01-12 MED ORDER — HEPARIN (PORCINE) IN NACL 1000-0.9 UT/500ML-% IV SOLN
INTRAVENOUS | Status: AC
  Filled 2021-01-12: qty 1000

## 2021-01-12 MED ORDER — LIDOCAINE HCL (PF) 1 % IJ SOLN
INTRAMUSCULAR | Status: DC | PRN
  Administered 2021-01-12: 15 mL via INTRADERMAL

## 2021-01-12 MED ORDER — HEPARIN SODIUM (PORCINE) 1000 UNIT/ML IJ SOLN
INTRAMUSCULAR | Status: AC
  Filled 2021-01-12: qty 1

## 2021-01-12 MED ORDER — CLOPIDOGREL BISULFATE 300 MG PO TABS
ORAL_TABLET | ORAL | Status: AC
  Filled 2021-01-12: qty 1

## 2021-01-12 MED ORDER — LIDOCAINE HCL (PF) 1 % IJ SOLN
INTRAMUSCULAR | Status: AC
  Filled 2021-01-12: qty 30

## 2021-01-12 MED ORDER — SODIUM CHLORIDE 0.9 % IV SOLN: INTRAVENOUS | Status: DC

## 2021-01-12 MED ORDER — HEPARIN SODIUM (PORCINE) 1000 UNIT/ML IJ SOLN
INTRAMUSCULAR | Status: DC | PRN
  Administered 2021-01-12: 7000 [IU] via INTRAVENOUS

## 2021-01-12 MED ORDER — LABETALOL HCL 5 MG/ML IV SOLN
10.0000 mg | INTRAVENOUS | Status: DC | PRN
  Administered 2021-01-14: 10 mg via INTRAVENOUS
  Filled 2021-01-12: qty 4

## 2021-01-12 MED ORDER — SODIUM CHLORIDE 0.9 % IV SOLN: INTRAVENOUS | Status: AC

## 2021-01-12 MED ORDER — HEPARIN (PORCINE) IN NACL 1000-0.9 UT/500ML-% IV SOLN
INTRAVENOUS | Status: DC | PRN
  Administered 2021-01-12 (×2): 500 mL

## 2021-01-12 MED ORDER — IODIXANOL 320 MG/ML IV SOLN
INTRAVENOUS | Status: DC | PRN
  Administered 2021-01-12: 95 mL via INTRA_ARTERIAL

## 2021-01-12 MED ORDER — ONDANSETRON HCL 4 MG/2ML IJ SOLN
INTRAMUSCULAR | Status: AC
  Filled 2021-01-12: qty 2

## 2021-01-12 MED ORDER — SODIUM CHLORIDE 0.9% FLUSH
3.0000 mL | Freq: Two times a day (BID) | INTRAVENOUS | Status: DC
  Administered 2021-01-12 – 2021-01-20 (×14): 3 mL via INTRAVENOUS

## 2021-01-12 MED ORDER — HYDRALAZINE HCL 20 MG/ML IJ SOLN: 5.0000 mg | INTRAMUSCULAR | Status: DC | PRN

## 2021-01-12 MED ORDER — LABETALOL HCL 5 MG/ML IV SOLN
INTRAVENOUS | Status: AC
  Filled 2021-01-12: qty 4

## 2021-01-12 MED ORDER — FAMOTIDINE IN NACL 20-0.9 MG/50ML-% IV SOLN
INTRAVENOUS | Status: AC | PRN
  Administered 2021-01-12: 20 mg via INTRAVENOUS

## 2021-01-12 MED ORDER — CLOPIDOGREL BISULFATE 300 MG PO TABS
ORAL_TABLET | ORAL | Status: DC | PRN
  Administered 2021-01-12: 300 mg via ORAL

## 2021-01-12 MED ORDER — SODIUM CHLORIDE 0.9 % IV SOLN: 250.0000 mL | INTRAVENOUS | Status: DC | PRN

## 2021-01-12 MED ORDER — FAMOTIDINE IN NACL 20-0.9 MG/50ML-% IV SOLN
INTRAVENOUS | Status: AC
  Filled 2021-01-12: qty 50

## 2021-01-12 MED ORDER — SODIUM CHLORIDE 0.9% FLUSH: 3.0000 mL | INTRAVENOUS | Status: DC | PRN

## 2021-01-12 MED ORDER — CLOPIDOGREL BISULFATE 75 MG PO TABS
75.0000 mg | ORAL_TABLET | Freq: Every day | ORAL | Status: DC
  Administered 2021-01-13 – 2021-01-20 (×8): 75 mg via ORAL
  Filled 2021-01-12 (×8): qty 1

## 2021-01-12 MED ORDER — CLOPIDOGREL BISULFATE 75 MG PO TABS: 300.0000 mg | ORAL_TABLET | Freq: Once | ORAL | Status: DC

## 2021-01-12 MED ORDER — CLOPIDOGREL BISULFATE 75 MG PO TABS: 75.0000 mg | ORAL_TABLET | Freq: Every day | ORAL | Status: DC

## 2021-01-12 MED ORDER — ONDANSETRON HCL 4 MG/2ML IJ SOLN: 4.0000 mg | Freq: Four times a day (QID) | INTRAMUSCULAR | Status: DC | PRN

## 2021-01-12 MED ORDER — ACETAMINOPHEN 325 MG PO TABS
650.0000 mg | ORAL_TABLET | ORAL | Status: DC | PRN
  Administered 2021-01-20: 650 mg via ORAL

## 2021-01-12 SURGICAL SUPPLY — 16 items
BALLN MUSTANG 6X80X135 (BALLOONS) ×3
BALLOON MUSTANG 6X80X135 (BALLOONS) ×2 IMPLANT
CATH OMNI FLUSH 5F 65CM (CATHETERS) ×3 IMPLANT
GLIDEWIRE ADV .035X260CM (WIRE) ×3 IMPLANT
GUIDEWIRE ANGLED .035X150CM (WIRE) ×3 IMPLANT
KIT ENCORE 26 ADVANTAGE (KITS) ×3 IMPLANT
KIT MICROPUNCTURE NIT STIFF (SHEATH) ×3 IMPLANT
KIT PV (KITS) ×3 IMPLANT
SHEATH PINNACLE 5F 10CM (SHEATH) ×3 IMPLANT
SHEATH PINNACLE ST 6F 45CM (SHEATH) ×3 IMPLANT
SHEATH PROBE COVER 6X72 (BAG) ×3 IMPLANT
STENT ELUVIA 7X80X130 (Permanent Stent) ×3 IMPLANT
SYR MEDRAD MARK V 150ML (SYRINGE) ×3 IMPLANT
TRANSDUCER W/STOPCOCK (MISCELLANEOUS) ×3 IMPLANT
TRAY PV CATH (CUSTOM PROCEDURE TRAY) ×3 IMPLANT
WIRE BENTSON .035X145CM (WIRE) ×3 IMPLANT

## 2021-01-12 NOTE — Op Note (Signed)
    Patient name: Heather [name] MRN: [id number] DOB: August 17, 1940 Sex: female  01/12/2021 Pre-operative Diagnosis: Critical limb ischemia of left lower extremity with tissue loss Post-operative diagnosis:  Same Surgeon:  Marty Heck, MD Procedure Performed: 1.  Ultrasound-guided access of right common femoral artery 2.  Aortogram including catheter selection of aorta 3.  Left lower extremity arteriogram with selection of third order branches 4.  Primary stent of left distal SFA/above knee popliteal artery high-grade stenosis with post balloon angioplasty (7 mm x 80 mm drug-coated Eluvia, postdilated with a 6 mm balloon)  Indications: 80 year old female seen in consultation yesterday by Dr. Stanford Breed with concern for ischemic left lower extremity.  She presents today for left lower extremity arteriogram plus intervention after risks benefits discussed.  Findings: Aortogram showed patent renal arteries and no flow-limiting stenosis in the aortoiliac segment..  Did have ectatic segment in the visceral segment of her abdominal aorta.  Left lower extremity runoff showed a patent common femoral and profunda with a focal high-grade greater than 80% stenosis in the distal SFA at Hunter's canal with some associated acute thrombus.  Distally the popliteal artery was widely patent with at least two-vessel runoff in the anterior tibial and posterior tibial artery given the peroneal was diminutive in the mid calf.  The lesion was crossed and primarily stented with a 7 mm x 80 mm drug coated Eluvia that was postdilated with a 6 mm angioplasty balloon.  I elected primary simply stent given concern for acute thrombus associated with this lesion   Procedure:  The patient was identified in the holding area and taken to room 8.  The patient was then placed supine on the table and prepped and draped in the usual sterile fashion.  A time out was called.  Ultrasound was used to evaluate the right common femoral  artery.  It was patent .  A digital ultrasound image was acquired.  A micropuncture needle was used to access the right common femoral artery under ultrasound guidance.  An 018 wire was advanced without resistance and a micropuncture sheath was placed.  The 018 wire was removed and a benson wire was placed.  The micropuncture sheath was exchanged for a 5 french sheath.  An omniflush catheter was advanced over the wire to the level of L-1.  An abdominal angiogram was obtained.  Next, using the omniflush catheter and a benson wire, the aortic bifurcation was crossed and the catheter was placed into theleft external iliac artery and left runoff was obtained.  Ultimately identified a high-grade greater than 80% stenosis in the distal left SFA at Hunter's canal.  We elected to treat this with primary stent placement.  A Glidewire advantage was then used to exchange for a long 6 French sheath in the right groin over the aortic bifurcation.  Patient was given 100 units/kg IV heparin.  I then crossed the SFA lesion with a Glidewire advantage and confirmed with hand-injection after we crossed the lesion.  This was then primarily stented with a 7 mm x 80 mm Eluvia postdilated with a 6 mm Mustang.  Final results are widely patent stent with no residual stenosis and preserved two-vessel runoff distally.  She was taken to holding to have sheath removed.  Complication: None  Condition: Stable     Marty Heck, MD [facility] [place] Office: [phone]

## 2021-01-12 NOTE — Progress Notes (Signed)
Dr. Carlis Abbott paged and made aware of ACT 196 w/level 1 hematoma; intermittently coughing up small amounts of faint blood tinged sputum. Ordered to pull arterial sheath now.

## 2021-01-12 NOTE — Progress Notes (Signed)
Sheath pulled at 1819 w/level 1 hematoma above insertion site.

## 2021-01-12 NOTE — Progress Notes (Signed)
Site area: rt groin arterial site Site Prior to Removal:  Level 1 Pressure Applied For: 40 minutes Manual:   yes Patient Status During Pull:  stable Post Pull Site:  Level 0, hematoma resolved, groin soft, faint bruising Post Pull Instructions Given:  yes Post Pull Pulses Present: rt dp dopplered Dressing Applied:  gauze and tegaderm Bedrest begins @ 1900 Comments:

## 2021-01-12 NOTE — Progress Notes (Signed)
[facility] [place] PROGRESS NOTE  ASSESSMENT / PLAN: Heather [name] is a 80 y.o. female  with atherosclerosis of native arteries of left lower extremity causing gangrene.   Plan left lower extremity angiogram with possible intervention via right common femoral artery access approach in cath lab today with Dr. Carlis Abbott.   SUBJECTIVE: No interval change. All questions regarding procedure answered. Discussed plan with daughter yesterday evening.  OBJECTIVE: BP 124/84 (BP Location: Right Arm)   Pulse 73   Temp 97.8 F (36.6 C) (Oral)   Resp 18   SpO2 97%   Intake/Output Summary (Last 24 hours) at 01/12/2021 1127 Last data filed at 01/12/2021 0207 Gross per 24 hour  Intake 1129.9 ml  Output --  Net 1129.9 ml    No distress Regular rate and rhythm Unlabored 2+ femoral pulses No palpable L pedal pulse Unchanged gangrene of left 4th / 5th toe  CBC Latest Ref Rng & Units 01/11/2021 01/10/2021 12/15/2020  WBC 4.0 - 10.5 K/uL 10.7(H) 10.8(H) 8.0  Hemoglobin 12.0 - 15.0 g/dL 11.1(L) 11.6(L) 12.1  Hematocrit 36.0 - 46.0 % 34.1(L) 34.4(L) 36.9  Platelets 150 - 400 K/uL 120(L) 140(L) 159     CMP Latest Ref Rng & Units 01/11/2021 01/10/2021 12/15/2020  Glucose 70 - 99 mg/dL 144(H) 142(H) 100(H)  BUN 8 - 23 mg/dL 7(L) 5(L) 10  Creatinine 0.44 - 1.00 mg/dL 0.87 0.97 0.96  Sodium 135 - 145 mmol/L 138 133(L) 145  Potassium 3.5 - 5.1 mmol/L 3.6 3.4(L) 3.7  Chloride 98 - 111 mmol/L 103 98 105  CO2 22 - 32 mmol/L 23 24 29   Calcium 8.9 - 10.3 mg/dL 9.3 9.2 9.8  Total Protein 6.5 - 8.1 g/dL 6.5 6.6 7.1  Total Bilirubin 0.3 - 1.2 mg/dL 1.4(H) 1.8(H) 0.9  Alkaline Phos 38 - 126 U/L 67 69 73  AST 15 - 41 U/L 38 36 18  ALT 0 - 44 U/L 23 22 9     [name] Stanford Breed, MD [facility] Phone Number: [phone] 01/12/2021 11:27 AM

## 2021-01-13 ENCOUNTER — Encounter (HOSPITAL_COMMUNITY): Payer: Self-pay | Admitting: Vascular Surgery

## 2021-01-13 LAB — CBC WITH DIFFERENTIAL/PLATELET
Abs Immature Granulocytes: 0.02 10*3/uL (ref 0.00–0.07)
Basophils Absolute: 0 10*3/uL (ref 0.0–0.1)
Basophils Relative: 0 %
Eosinophils Absolute: 0 10*3/uL (ref 0.0–0.5)
Eosinophils Relative: 0 %
HCT: 30.3 % — ABNORMAL LOW (ref 36.0–46.0)
Hemoglobin: 10.1 g/dL — ABNORMAL LOW (ref 12.0–15.0)
Immature Granulocytes: 0 %
Lymphocytes Relative: 23 %
Lymphs Abs: 2.1 10*3/uL (ref 0.7–4.0)
MCH: 31.2 pg (ref 26.0–34.0)
MCHC: 33.3 g/dL (ref 30.0–36.0)
MCV: 93.5 fL (ref 80.0–100.0)
Monocytes Absolute: 0.6 10*3/uL (ref 0.1–1.0)
Monocytes Relative: 7 %
Neutro Abs: 6.3 10*3/uL (ref 1.7–7.7)
Neutrophils Relative %: 70 %
Platelets: 152 10*3/uL (ref 150–400)
RBC: 3.24 MIL/uL — ABNORMAL LOW (ref 3.87–5.11)
RDW: 13.9 % (ref 11.5–15.5)
WBC: 9.1 10*3/uL (ref 4.0–10.5)
nRBC: 0 % (ref 0.0–0.2)

## 2021-01-13 LAB — BASIC METABOLIC PANEL
Anion gap: 12 (ref 5–15)
BUN: 12 mg/dL (ref 8–23)
CO2: 20 mmol/L — ABNORMAL LOW (ref 22–32)
Calcium: 8.4 mg/dL — ABNORMAL LOW (ref 8.9–10.3)
Chloride: 106 mmol/L (ref 98–111)
Creatinine, Ser: 0.97 mg/dL (ref 0.44–1.00)
GFR, Estimated: 59 mL/min — ABNORMAL LOW (ref >60)
Glucose, Bld: 104 mg/dL — ABNORMAL HIGH (ref 70–99)
Potassium: 3.9 mmol/L (ref 3.5–5.1)
Sodium: 138 mmol/L (ref 135–145)

## 2021-01-13 LAB — POCT ACTIVATED CLOTTING TIME: Activated Clotting Time: 242 seconds

## 2021-01-13 MED ORDER — MECLIZINE HCL 12.5 MG PO TABS
12.5000 mg | ORAL_TABLET | ORAL | Status: DC | PRN
  Administered 2021-01-13 – 2021-01-17 (×3): 12.5 mg via ORAL
  Filled 2021-01-13 (×4): qty 1

## 2021-01-13 NOTE — Evaluation (Signed)
Physical Therapy Evaluation Patient Details Name: Heather [name] MRN: [id number] DOB: [birth year] Today's Date: 01/13/2021   History of Present Illness  80 y/o female presented to ED on 7/6 for L foot pain. Recently admitted at [facility] 6/24-6/27 for ischemic L toe and cellulitis. CTA with runoff performed without significant stenosis however bilateral ABIs poor ciruclation in L foot. Patient now s/p L LE arteriogram with stent placement on 7/7. PMH: non small cell lung cancer, vertigo, psoriasis, tobacco use, Afib, ascending aorta aneurysm  Clinical Impression  PTA, patient lives alone and reports independence with mobility. Patient reports son and daughter in law can help as she needs. Patient currently presents with impaired balance, generalized weakness, decreased activity tolerance, impaired cognition, and decreased safety awareness. Patient had LOB x 3 during short ambulation distance requiring minA to recover. On arrival, patient on RA with spO2 at 90% but dropped to 87-88% with mobility. Instructed on pursed lip breathing with improvement to 92%, donned 2L O2 [place] at end of session and notified RN. Patient reporting dizziness with movement and states this is baseline due to vertigo. Patient will benefit from skilled PT services during acute stay to address listed deficits. Recommend HHPT if family able to provide 24 hour supervision/assistance at discharge. If family unable, patient will need SNF for short term rehab to maximize functional independence.     Follow Up Recommendations SNF;Home health PT;Supervision/Assistance - 24 hour    Equipment Recommendations  None recommended by PT    Recommendations for Other Services       Precautions / Restrictions Precautions Precautions: Fall Precaution Comments: WBAT L LE in darco shoe Restrictions Weight Bearing Restrictions: Yes LLE Weight Bearing: Weight bearing as tolerated      Mobility  Bed Mobility Overal bed  mobility: Needs Assistance Bed Mobility: Supine to Sit;Sit to Supine     Supine to sit: Supervision Sit to supine: Supervision   General bed mobility comments: supervision for safety    Transfers Overall transfer level: Needs assistance Equipment used: None Transfers: Sit to/from Stand Sit to Stand: Min guard         General transfer comment: min guard for safety. Dizziness reported upon initial stand with patient needing seated rest break. Subsequent sit to stands with minimal dizziness reported.  Ambulation/Gait Ambulation/Gait assistance: Min assist Gait Distance (Feet): 12 Feet (x12') Assistive device: None Gait Pattern/deviations: Step-to pattern;Decreased stride length;Shuffle;Drifts right/left;Narrow base of support;Trunk flexed Gait velocity: decreased   General Gait Details: LOB x 3 during short ambulation distances with minA to recover. Patient required frequent cueing to pick feet up due to shuffling gait. Decreased awareness of need for assistance for safety. SpO2 dropped 87-88% during ambulation, instructed on pursed lip breathing with improvement to 92%  Stairs            Wheelchair Mobility    Modified Rankin (Stroke Patients Only)       Balance Overall balance assessment: Needs assistance Sitting-balance support: No upper extremity supported;Feet supported Sitting balance-Leahy Scale: Good     Standing balance support: No upper extremity supported;During functional activity Standing balance-Leahy Scale: Poor Standing balance comment: LOB x 3 during mobility with minA to recover                             Pertinent Vitals/Pain Pain Assessment: 0-10 Pain Score: 6  Pain Location: abdomen, L LE Pain Descriptors / Indicators: Guarding;Grimacing Pain Intervention(s): Monitored during session;Repositioned;Limited activity within  patient's tolerance    Home Living Family/patient expects to be discharged to:: Private residence Living  Arrangements: Children Available Help at Discharge: Family;Available PRN/intermittently Type of Home: Mobile home Home Access: Stairs to enter Entrance Stairs-Rails: Right;Left;Can reach both Entrance Stairs-Number of Steps: 5 Home Layout: One level Home Equipment: Kaled [place]ende - 2 wheels;Cane - single point;Bedside commode      Prior Function Level of Independence: Independent         Comments: drives. Reports stumbling in the home but never fell on floor     Hand Dominance        Extremity/Trunk Assessment   Upper Extremity Assessment Upper Extremity Assessment: Defer to OT evaluation    Lower Extremity Assessment Lower Extremity Assessment: Generalized weakness    Cervical / Trunk Assessment Cervical / Trunk Assessment: Kyphotic  Communication   Communication: No difficulties  Cognition Arousal/Alertness: Awake/alert Behavior During Therapy: WFL for tasks assessed/performed Overall Cognitive Status: Impaired/Different from baseline Area of Impairment: Orientation;Attention;Memory;Safety/judgement;Awareness                 Orientation Level: Disoriented to;Time Current Attention Level: Sustained Memory: Decreased short-term memory   Safety/Judgement: Decreased awareness of safety;Decreased awareness of deficits Awareness: Intellectual   General Comments: Disoriented to time with multimodal cueing to reorient. Decreased awareness of safety and deficits with LOB x 3 with no awareness for need of assistance at home      General Comments General comments (skin integrity, edema, etc.): On RA, spO2 at rest 90%. With mobility, spO2 dropped 87-88%. Instructed on pursed lip breathing with improvement to 92%. Donned 2L O2 [place] at end of session, notified RN of findings.    Exercises     Assessment/Plan    PT Assessment Patient needs continued PT services  PT Problem List Decreased strength;Decreased activity tolerance;Decreased balance;Decreased mobility;Decreased  cognition;Decreased safety awareness;Cardiopulmonary status limiting activity       PT Treatment Interventions DME instruction;Gait training;Functional mobility training;Stair training;Therapeutic activities;Therapeutic exercise;Balance training;Patient/family education    PT Goals (Current goals can be found in the Care Plan section)  Acute Rehab PT Goals Patient Stated Goal: to go home PT Goal Formulation: With patient Time For Goal Achievement: 01/27/21 Potential to Achieve Goals: Good    Frequency Min 3X/week   Barriers to discharge        Co-evaluation               AM-PAC PT "6 Clicks" Mobility  Outcome Measure Help needed turning from your back to your side while in a flat bed without using bedrails?: A Little Help needed moving from lying on your back to sitting on the side of a flat bed without using bedrails?: A Little Help needed moving to and from a bed to a chair (including a wheelchair)?: A Little Help needed standing up from a chair using your arms (e.g., wheelchair or bedside chair)?: A Little Help needed to walk in hospital room?: A Little Help needed climbing 3-5 steps with a railing? : A Little 6 Click Score: 18    End of Session Equipment Utilized During Treatment: Gait belt Activity Tolerance: Patient tolerated treatment well Patient left: in bed;with call bell/phone within reach;with bed alarm set Nurse Communication: Mobility status;Other (comment) (oxygen requirement) PT Visit Diagnosis: Unsteadiness on feet (R26.81);Muscle weakness (generalized) (M62.81);Other abnormalities of gait and mobility (R26.89);Dizziness and giddiness (R42)    Time: 9629-5284 PT Time Calculation (min) (ACUTE ONLY): 36 min   Charges:   PT Evaluation $PT Eval Moderate Complexity: 1 Mod  Gregory Dowe A. Gilford Rile PT, DPT Acute Rehabilitation Services Pager [phone] Office [phone]   Linna Hoff 01/13/2021, 9:55 AM

## 2021-01-13 NOTE — Progress Notes (Signed)
Alert and oriented x 4. Hemodynamics stable, on 2 LPM of NCL, EKG is irregular HR, 75-80s, sinus rhythm with frequent PAC on monitor per Pt's baseline. BP within normal limits. No acute distress noted.  Right groin appears to have level 1 hematoma since from PCAU. MD made aware. We are able to pulpate good  and strong DP and PT pulses bilaterally.   Kennyth Lose, RN

## 2021-01-13 NOTE — TOC Initial Note (Signed)
Transition of Care (TOC) - Initial/Assessment Note  Marvetta Gibbons RN, BSN Transitions of Care Unit 4E- RN Case Manager See Treatment Team for direct phone #    Patient Details  Name: Heather [name] MRN: [id number] Date of Birth: September 27, 1940  Transition of Care [facility]) CM/SW Contact:    Dawayne Patricia, RN Phone Number: 01/13/2021, 4:33 PM  Clinical Narrative:                 Pt admitted from home, noted recs for 24/7 supervision/assistance for safe d/c home. Call made to family to see if they can provide needed support.  Call made to son- no answer- msg left.  Call made to daughter - [name]- and per TC she states she lives in [place] and unable to provide support, not sure brother who lives in [place] can, she states she needs to speak with family and patient about recommendations and see what plan will be Home with needed support or STSNF. She will give this CM a return call. Reviewed with daughter PT/OT recommendations.   1630- received return call from daughter- she has spoken with pt and family, confirmed that they are unable to provide the needed 24/7 support for pt to safely return home at this time. They request to look into STSNF placement.  Daughter request that she be contacted with any updates regarding SNF placement- her contact is [phone]- Prudencio Burly.  Will have weekend CSW f/u and start SNF bed search.   Expected Discharge Plan: Skilled Nursing Facility Barriers to Discharge: SNF Pending bed offer   Patient Goals and CMS Choice Patient states their goals for this hospitalization and ongoing recovery are:: return home after rehab stay CMS Medicare.gov Compare Post Acute Care list provided to:: Patient Represenative (must comment) (daughter) Choice offered to / list presented to : Adult Children  Expected Discharge Plan and Services Expected Discharge Plan: [place] In-house Referral: Clinical Social Work Discharge Planning Services: CM  Consult Post Acute Care Choice: [place] arrangements for the past 2 months: [place]                                      Prior Living Arrangements/Services Living arrangements for the past 2 months: Single Family Home Lives with:: Self Patient language and need for interpreter reviewed:: Yes Do you feel safe going back to the place where you live?: Yes      Need for Family Participation in Patient Care: Yes (Comment)   Current home services: DME Criminal Activity/Legal Involvement Pertinent to Current Situation/Hospitalization: No - Comment as needed  Activities of Daily Living      Permission Sought/Granted Permission sought to share information with : Facility Sport and exercise psychologist, Family Supports Permission granted to share information with : Yes, Verbal Permission Granted  Share Information with NAME: Prudencio Burly     Permission granted to share info w Relationship: daughter  Permission granted to share info w Contact Information: [phone]  Emotional Assessment Appearance:: Appears stated age       Alcohol / Substance Use: Not Applicable Psych Involvement: No (comment)  Admission diagnosis:  Peripheral arterial disease ([place]) [I73.9] Ischemic necrosis of toe ([place]) [I96] Atherosclerosis of native artery of left lower extremity with gangrene ([place]) [I70.262] Severe comorbid illness [R69] Patient Active Problem List   Diagnosis Date Noted   Atherosclerosis of native artery of left lower extremity with gangrene (  [place]) 01/11/2021   Adenocarcinoma of left lung, stage 4 ([place]) 12/15/2020   Prolapsed internal hemorrhoids, grade 3 03/25/2020   Grade III hemorrhoids 11/06/2019   Primary adenocarcinoma of upper lobe of left lung ([place]) 08/07/2017   Malignant neoplasm of upper lobe of left lung ([place]) 08/01/2017   History of adenomatous polyp of colon 02/06/2017   Ascending aortic aneurysm ([place]) 07/20/2014   Non-small cell  carcinoma of right lung, stage 1 ([place]) 07/20/2014   Constipation 01/07/2012   Encounter for screening colonoscopy 01/07/2012   Tobacco abuse    Lung nodule 08/21/2011   PCP:  Rosita Fire, MD Pharmacy:   [facility], [address] [place] [address] Phone: [phone] Fax: [phone]  [facility] [address] [facility] [place] [address] Phone: [phone] Fax: [phone]     Social Determinants of Health (SDOH) Interventions    Readmission Risk Interventions No flowsheet data found.

## 2021-01-13 NOTE — Evaluation (Signed)
Occupational Therapy Evaluation Patient Details Name: Heather [name] MRN: [id number] DOB: 1940/09/24 Today's Date: 01/13/2021    History of Present Illness 80 y/o female presented to ED on 7/6 for L foot pain. Recently admitted at [facility] 6/24-6/27 for ischemic L toe and cellulitis. CTA with runoff performed without significant stenosis however bilateral ABIs poor ciruclation in L foot. Patient now s/p L LE arteriogram with stent placement on 7/7. PMH: non small cell lung cancer, vertigo, psoriasis, tobacco use, Afib, ascending aorta aneurysm   Clinical Impression   PTA patient reports independent, driving and completing all IADLs.  Admitted for above and presenting with problem list below, including impaired cognition, decreased activity tolerance, generalized weakness and impaired balance. Cognitively, pt oriented and following commands but demonstrates decreased recall, safety awareness, problem solving.  She completes transfers with min assist, in room mobility using RW with min guard and ADLs with up to min assist. Fatigues easily, is on 2L supplemental O2 via [place] with VSS.  Based on performance today, believe pt will benefit from further OT services while admitted in order to optimize independence, safety with ADLs, IADls and mobility.  If family can provide 24/7 support, HHOT would be appropriate otherwise will need SNF.     Follow Up Recommendations  SNF;Supervision/Assistance - 24 hour (HHOT if able to have 24/7 support)    Equipment Recommendations  None recommended by OT    Recommendations for Other Services       Precautions / Restrictions Precautions Precautions: Fall Precaution Comments: WBAT L LE in darco shoe Restrictions Weight Bearing Restrictions: Yes LLE Weight Bearing: Weight bearing as tolerated      Mobility Bed Mobility Overal bed mobility: Needs Assistance Bed Mobility: Supine to Sit;Sit to Supine     Supine to sit: Supervision Sit to  supine: Supervision   General bed mobility comments: supervision for safety    Transfers Overall transfer level: Needs assistance Equipment used: Rolling walker (2 wheeled) Transfers: Sit to/from Stand Sit to Stand: Min assist         General transfer comment: min assist to power up from EOB, increased time and effort with cueing for hand placement    Balance Overall balance assessment: Needs assistance Sitting-balance support: No upper extremity supported;Feet supported Sitting balance-Leahy Scale: Good     Standing balance support: During functional activity;Bilateral upper extremity supported;No upper extremity supported Standing balance-Leahy Scale: Poor Standing balance comment: dynamically min guard with UE support and statically 0-1 hand support with ADLs                           ADL either performed or assessed with clinical judgement   ADL Overall ADL's : Needs assistance/impaired     Grooming: Min guard;Standing;Wash/dry face   Upper Body Bathing: Set up;Sitting   Lower Body Bathing: Minimal assistance;Sit to/from stand   Upper Body Dressing : Set up;Sitting   Lower Body Dressing: Minimal assistance;Sit to/from stand   Toilet Transfer: Minimal assistance;Ambulation Toilet Transfer Details (indicate cue type and reason): RW, simulated in room         Functional mobility during ADLs: Minimal assistance;Rolling walker General ADL Comments: pt limited by impaired balance, decreased activity tolerance, generalized weakness, pain and cognition     Vision         Perception     Praxis      Pertinent Vitals/Pain Pain Assessment: Faces Pain Score: 6  Faces Pain Scale: Hurts even more  Pain Location: L toes Pain Descriptors / Indicators: Guarding;Grimacing Pain Intervention(s): Limited activity within patient's tolerance;Monitored during session;Repositioned     Hand Dominance  (bil)   Extremity/Trunk Assessment Upper Extremity  Assessment Upper Extremity Assessment: Generalized weakness   Lower Extremity Assessment Lower Extremity Assessment: Defer to PT evaluation   Cervical / Trunk Assessment Cervical / Trunk Assessment: Kyphotic   Communication Communication Communication: No difficulties   Cognition Arousal/Alertness: Awake/alert Behavior During Therapy: WFL for tasks assessed/performed Overall Cognitive Status: Impaired/Different from baseline Area of Impairment: Attention;Memory;Following commands;Safety/judgement;Awareness;Problem solving                 Orientation Level: Disoriented to;Time Current Attention Level: Sustained Memory: Decreased short-term memory Following Commands: Follows one step commands consistently;Follows one step commands with increased time;Follows multi-step commands inconsistently Safety/Judgement: Decreased awareness of safety;Decreased awareness of deficits Awareness: Emergent Problem Solving: Slow processing;Difficulty sequencing;Requires verbal cues General Comments: Pt alert and oriented.  Demonstrating decreased awareness to deficits, safety and need for increased assistance.  Poor problem sovling.   General Comments  VSS on 2L, fatigues easily    Exercises     Shoulder Instructions      Home Living Family/patient expects to be discharged to:: Private residence Living Arrangements: Children (son) Available Help at Discharge: Family;Available PRN/intermittently Type of Home: Mobile home Home Access: Stairs to enter Entrance Stairs-Number of Steps: 5 Entrance Stairs-Rails: Right;Left;Can reach both Home Layout: One level     Bathroom Shower/Tub: Teacher, early years/pre: Standard     Home Equipment: Environmental consultant - 2 wheels;Cane - single point;Bedside commode;Hand held shower head;Shower seat;Grab bars - tub/shower          Prior Functioning/Environment Level of Independence: Independent        Comments: drives, independent ADLs, IADLs         OT Problem List: Decreased strength;Decreased activity tolerance;Impaired balance (sitting and/or standing);Decreased cognition;Decreased safety awareness;Decreased knowledge of use of DME or AE;Decreased knowledge of precautions;Pain      OT Treatment/Interventions: Self-care/ADL training;DME and/or AE instruction;Therapeutic exercise;Therapeutic activities;Patient/family education;Balance training;Cognitive remediation/compensation    OT Goals(Current goals can be found in the care plan section) Acute Rehab OT Goals Patient Stated Goal: to go home OT Goal Formulation: With patient Time For Goal Achievement: 01/27/21 Potential to Achieve Goals: Good  OT Frequency: Min 2X/week   Barriers to D/C:            Co-evaluation              AM-PAC OT "6 Clicks" Daily Activity     Outcome Measure Help from another person eating meals?: None Help from another person taking care of personal grooming?: A Little Help from another person toileting, which includes using toliet, bedpan, or urinal?: A Little Help from another person bathing (including washing, rinsing, drying)?: A Little Help from another person to put on and taking off regular upper body clothing?: A Little Help from another person to put on and taking off regular lower body clothing?: A Little 6 Click Score: 19   End of Session Equipment Utilized During Treatment: Gait belt;Rolling walker Nurse Communication: Mobility status  Activity Tolerance: Patient tolerated treatment well Patient left: with call bell/phone within reach;in bed;with bed alarm set  OT Visit Diagnosis: Other abnormalities of gait and mobility (R26.89);Muscle weakness (generalized) (M62.81);Pain;Other symptoms and signs involving cognitive function Pain - Right/Left: Left Pain - part of body: Ankle and joints of foot  Time: 9163-8466 OT Time Calculation (min): 25 min Charges:  OT General Charges $OT Visit: 1 Visit OT  Evaluation $OT Eval Moderate Complexity: 1 Mod OT Treatments $Self Care/Home Management : 8-22 mins  Jolaine Artist, OT Acute Rehabilitation Services Pager [phone] Office [phone]   Delight Stare 01/13/2021, 1:33 PM

## 2021-01-13 NOTE — Progress Notes (Signed)
  Progress Note    01/13/2021 8:07 AM 1 Day Post-Op  Subjective:  L foot feels better   Vitals:   01/13/21 0600 01/13/21 0700  BP:  (!) 140/96  Pulse: 62 66  Resp: 20 20  Temp:  97.9 F (36.6 C)  SpO2: 97% 94%   Physical Exam: Lungs:  non labored Incisions:  R groin cath site without hematoma Extremities:  palpable L DP Neurologic: A&O  CBC    Component Value Date/Time   WBC 10.7 (H) 01/11/2021 1140   RBC 3.63 (L) 01/11/2021 1140   HGB 11.1 (L) 01/11/2021 1140   HGB 12.1 12/15/2020 0716   HCT 34.1 (L) 01/11/2021 1140   PLT 120 (L) 01/11/2021 1140   PLT 159 12/15/2020 0716   MCV 93.9 01/11/2021 1140   MCH 30.6 01/11/2021 1140   MCHC 32.6 01/11/2021 1140   RDW 13.9 01/11/2021 1140   LYMPHSABS 1.8 01/10/2021 1941   MONOABS 0.6 01/10/2021 1941   EOSABS 0.1 01/10/2021 1941   BASOSABS 0.0 01/10/2021 1941    BMET    Component Value Date/Time   NA 138 01/11/2021 1140   K 3.6 01/11/2021 1140   CL 103 01/11/2021 1140   CO2 23 01/11/2021 1140   GLUCOSE 144 (H) 01/11/2021 1140   BUN 7 (L) 01/11/2021 1140   CREATININE 0.87 01/11/2021 1140   CREATININE 0.96 12/15/2020 0716   CALCIUM 9.3 01/11/2021 1140   GFRNONAA >60 01/11/2021 1140   GFRNONAA 60 (L) 12/15/2020 0716   GFRAA >60 03/17/2020 1102    INR    Component Value Date/Time   INR 1.4 (H) 01/11/2021 1140     Intake/Output Summary (Last 24 hours) at 01/13/2021 0626 Last data filed at 01/13/2021 0703 Gross per 24 hour  Intake 2794.31 ml  Output 1 ml  Net 2793.31 ml     Assessment/Plan:  80 y.o. female is s/p L SFA stent 1 Day Post-Op   L foot well perfused with palpable DP pulse R groin cath site without hematoma PT/OT to evaluate Home pending the above   Dagoberto Ligas, PA-C [facility] [phone] 01/13/2021 8:07 AM

## 2021-01-14 MED ORDER — ATORVASTATIN CALCIUM 40 MG PO TABS: 40.0000 mg | ORAL_TABLET | Freq: Every day | ORAL | 6 refills | Status: AC

## 2021-01-14 MED ORDER — ASPIRIN 81 MG PO TBEC: 81.0000 mg | DELAYED_RELEASE_TABLET | Freq: Every day | ORAL | 11 refills | Status: AC

## 2021-01-14 MED ORDER — CLOPIDOGREL BISULFATE 75 MG PO TABS: 75.0000 mg | ORAL_TABLET | Freq: Every day | ORAL | 6 refills | Status: DC

## 2021-01-14 NOTE — Progress Notes (Addendum)
  Progress Note    01/14/2021 10:50 AM 2 Days Post-Op  Subjective:  No complaints   Vitals:   01/14/21 0314 01/14/21 0839  BP: (!) 142/88 (!) 142/94  Pulse: 66 70  Resp: 20 18  Temp: 98.4 F (36.9 C) 97.7 F (36.5 C)  SpO2: 98% 94%    Physical Exam: General appearance: Awake, alert in no apparent distress Cardiac: Heart rate and rhythm are regular Respirations: Nonlabored Right groin soft without bleeding or hematoma Extremities: Both feet are warm with intact sensation and motor function.   Pulse/Doppler exam: Palpable left DP pulse   CBC    Component Value Date/Time   WBC 9.1 01/13/2021 1139   RBC 3.24 (L) 01/13/2021 1139   HGB 10.1 (L) 01/13/2021 1139   HGB 12.1 12/15/2020 0716   HCT 30.3 (L) 01/13/2021 1139   PLT 152 01/13/2021 1139   PLT 159 12/15/2020 0716   MCV 93.5 01/13/2021 1139   MCH 31.2 01/13/2021 1139   MCHC 33.3 01/13/2021 1139   RDW 13.9 01/13/2021 1139   LYMPHSABS 2.1 01/13/2021 1139   MONOABS 0.6 01/13/2021 1139   EOSABS 0.0 01/13/2021 1139   BASOSABS 0.0 01/13/2021 1139    BMET    Component Value Date/Time   NA 138 01/13/2021 1139   K 3.9 01/13/2021 1139   CL 106 01/13/2021 1139   CO2 20 (L) 01/13/2021 1139   GLUCOSE 104 (H) 01/13/2021 1139   BUN 12 01/13/2021 1139   CREATININE 0.97 01/13/2021 1139   CREATININE 0.96 12/15/2020 0716   CALCIUM 8.4 (L) 01/13/2021 1139   GFRNONAA 59 (L) 01/13/2021 1139   GFRNONAA 60 (L) 12/15/2020 0716   GFRAA >60 03/17/2020 1102     Intake/Output Summary (Last 24 hours) at 01/14/2021 1050 Last data filed at 01/13/2021 1949 Gross per 24 hour  Intake 250 ml  Output --  Net 250 ml    HOSPITAL MEDICATIONS Scheduled Meds:  acetaminophen  650 mg Oral Q6H   aspirin EC  81 mg Oral Daily   atorvastatin  40 mg Oral Daily   clopidogrel  75 mg Oral Daily   heparin  5,000 Units Subcutaneous Q8H   metoprolol succinate  25 mg Oral BID   osimertinib mesylate  80 mg Oral Daily   pantoprazole  40 mg Oral  Daily   sodium chloride flush  3 mL Intravenous Q12H   Continuous Infusions:  sodium chloride     lactated ringers 125 mL/hr at 01/12/21 0207   PRN Meds:.sodium chloride, acetaminophen, alum & mag hydroxide-simeth, guaiFENesin-dextromethorphan, hydrALAZINE, labetalol, meclizine, metoprolol tartrate, ondansetron, oxyCODONE, phenol, sodium chloride flush  Assessment and Plan: S/p Left lower extremity arteriogram with selection of third order branches Primary stent of left distal SFA/above knee popliteal artery high-grade stenosis with post balloon angioplasty (7 mm x 80 mm drug-coated Eluvia, postdilated with a 6 mm balloon). LLE well perfused. Continue aspirin, Plavix and statin.  VSS. Afebrile. Discharge home   -DVT prophylaxis:  heparin Ferron   Risa Grill, PA-C [facility] [phone] 01/14/2021  10:50 AM   VASCULAR STAFF ADDENDUM: I have independently interviewed and examined the patient. I agree with the above.   [name] Stanford Breed, MD [facility] Phone Number: [phone] 01/14/21 1050

## 2021-01-14 NOTE — NC FL2 (Signed)
[place] LEVEL OF CARE SCREENING TOOL     IDENTIFICATION  Patient Name: Heather [name] Birthdate: 10/29/1940 Sex: female Admission Date (Current Location): 01/10/2021  [facility] [place] Number:      Facility and Address:  The [place] [place] [address], [place], [place] [address]      Provider Number: [id number]  Attending Physician Name and Address:  Cherre Robins, MD  Relative Name and Phone Number:  Manya Silvas [phone]    Current Level of Care: Hospital Recommended Level of Care: Nursing Facility Prior Approval Number:    Date Approved/Denied:   PASRR Number: [id number]  Discharge Plan: SNF    Current Diagnoses: Patient Active Problem List   Diagnosis Date Noted   Atherosclerosis of native artery of left lower extremity with gangrene ([place]) 01/11/2021   Adenocarcinoma of left lung, stage 4 ([place]) 12/15/2020   Prolapsed internal hemorrhoids, grade 3 03/25/2020   Grade III hemorrhoids 11/06/2019   Primary adenocarcinoma of upper lobe of left lung ([place]) 08/07/2017   Malignant neoplasm of upper lobe of left lung ([place]) 08/01/2017   History of adenomatous polyp of colon 02/06/2017   Ascending aortic aneurysm ([place]) 07/20/2014   Non-small cell carcinoma of right lung, stage 1 ([place]) 07/20/2014   Constipation 01/07/2012   Encounter for screening colonoscopy 01/07/2012   Tobacco abuse    Lung nodule 08/21/2011    Orientation RESPIRATION BLADDER Height & Weight     Self, Time, Situation  O2 (2L PRN) Continent Weight:   Height:     BEHAVIORAL SYMPTOMS/MOOD NEUROLOGICAL BOWEL NUTRITION STATUS      Continent Diet (see discharge summary)  AMBULATORY STATUS COMMUNICATION OF NEEDS Skin   Supervision Verbally Normal                       Personal Care Assistance Level of Assistance  Bathing, Dressing Bathing Assistance: Limited assistance   Dressing Assistance: Limited assistance     Functional Limitations Info              [place]  PT (By licensed PT), OT (By licensed OT)     PT Frequency: per facility OT Frequency: per facility            Contractures Contractures Info: Not present    Additional Factors Info  Code Status Code Status Info: FULL             Current Medications (01/14/2021):  This is the current hospital active medication list Current Facility-Administered Medications  Medication Dose Route Frequency Provider Last Rate Last Admin   0.9 %  sodium chloride infusion  250 mL Intravenous PRN Marty Heck, MD       acetaminophen (TYLENOL) tablet 650 mg  650 mg Oral Q6H Marty Heck, MD   650 mg at 01/14/21 1200   acetaminophen (TYLENOL) tablet 650 mg  650 mg Oral Q4H PRN Marty Heck, MD       alum & mag hydroxide-simeth (MAALOX/MYLANTA) 200-200-20 MG/5ML suspension 15-30 mL  15-30 mL Oral Q2H PRN Marty Heck, MD       aspirin EC tablet 81 mg  81 mg Oral Daily Marty Heck, MD   81 mg at 01/14/21 0935   atorvastatin (LIPITOR) tablet 40 mg  40 mg Oral Daily Marty Heck, MD   40 mg at 01/14/21 0934   clopidogrel (PLAVIX) tablet 75 mg  75 mg Oral Daily Hawken,  Yevonne Aline, MD   75 mg at 01/14/21 0934   guaiFENesin-dextromethorphan (ROBITUSSIN DM) 100-10 MG/5ML syrup 15 mL  15 mL Oral Q4H PRN Marty Heck, MD   15 mL at 01/12/21 1832   heparin injection 5,000 Units  5,000 Units Subcutaneous Q8H Marty Heck, MD   5,000 Units at 01/14/21 6761   hydrALAZINE (APRESOLINE) injection 5 mg  5 mg Intravenous Q20 Min PRN Marty Heck, MD       labetalol (NORMODYNE) injection 10 mg  10 mg Intravenous Q10 min PRN Marty Heck, MD       lactated ringers infusion   Intravenous Continuous Marty Heck, MD 125 mL/hr at 01/12/21 0207 New Bag at 01/12/21 0207   meclizine (ANTIVERT) tablet 12.5 mg  12.5 mg Oral PRN Dagoberto Ligas, PA-C   12.5 mg at 01/13/21 1049   metoprolol succinate  (TOPROL-XL) 24 hr tablet 25 mg  25 mg Oral BID Marty Heck, MD   25 mg at 01/14/21 0935   metoprolol tartrate (LOPRESSOR) injection 2-5 mg  2-5 mg Intravenous Q2H PRN Marty Heck, MD       ondansetron [facility]) injection 4 mg  4 mg Intravenous Q6H PRN Marty Heck, MD   4 mg at 01/12/21 1853   osimertinib mesylate (TAGRISSO) tablet 80 mg (**patient supplied med**)  80 mg Oral Daily Marty Heck, MD   80 mg at 01/14/21 0556   oxyCODONE (Oxy IR/ROXICODONE) immediate release tablet 5-10 mg  5-10 mg Oral Q4H PRN Marty Heck, MD   10 mg at 01/13/21 1049   pantoprazole (PROTONIX) EC tablet 40 mg  40 mg Oral Daily Marty Heck, MD   40 mg at 01/14/21 0934   phenol (CHLORASEPTIC) mouth spray 1 spray  1 spray Mouth/Throat PRN Marty Heck, MD       sodium chloride flush (NS) 0.9 % injection 3 mL  3 mL Intravenous Q12H Marty Heck, MD   3 mL at 01/14/21 0935   sodium chloride flush (NS) 0.9 % injection 3 mL  3 mL Intravenous PRN Marty Heck, MD         Discharge Medications: Please see discharge summary for a list of discharge medications.  Relevant Imaging Results:  Relevant Lab Results:   Additional Information Moderna COVID vaccine 1/21 and 2/21; [id number]  Nadine, LCSWA

## 2021-01-14 NOTE — Progress Notes (Signed)
SATURATION QUALIFICATIONS: (This note is used to comply with regulatory documentation for home oxygen)  Patient Saturations on Room Air at Rest = 92%  Patient Saturations on Room Air while Ambulating = 90%  Patient Saturations on 1 Liters of oxygen while Ambulating = 95%

## 2021-01-14 NOTE — Discharge Instructions (Signed)
° °  [facility] [place] ° °Discharge Instructions ° °Lower Extremity Angiogram; Angioplasty/Stenting ° °Please refer to the following instructions for your post-procedure care. Your surgeon or physician assistant will discuss any changes with you. ° °Activity ° °Avoid lifting more than 8 pounds (1 gallons of milk) for 72 hours (3 days) after your procedure. You may walk as much as you can tolerate. It's OK to drive after 72 hours. ° °Bathing/Showering ° °You may shower the day after your procedure. If you have a bandage, you may remove it at 24- 48 hours. Clean your incision site with mild soap and water. Pat the area dry with a clean towel. ° °Diet ° °Resume your pre-procedure diet. There are no special food restrictions following this procedure. All patients with peripheral vascular disease should follow a low fat/low cholesterol diet. In order to heal from your surgery, it is CRITICAL to get adequate nutrition. Your body requires vitamins, minerals, and protein. Vegetables are the best source of vitamins and minerals. Vegetables also provide the perfect balance of protein. Processed food has little nutritional value, so try to avoid this. ° °Medications ° °Resume taking all of your medications unless your doctor tells you not to. If your incision is causing pain, you may take over-the-counter pain relievers such as acetaminophen (Tylenol) ° °Follow Up ° °Follow up will be arranged at the time of your procedure. You may have an office visit scheduled or may be scheduled for surgery. Ask your surgeon if you have any questions. ° °Please call us immediately for any of the following conditions: °•Severe or worsening pain your legs or feet at rest or with walking. °•Increased pain, redness, drainage at your groin puncture site. °•Fever of 101 degrees or higher. °•If you have any mild or slow bleeding from your puncture site: lie down, apply firm constant pressure over the area with a piece of  gauze or a clean wash cloth for 30 minutes- no peeking!, call 911 right away if you are still bleeding after 30 minutes, or if the bleeding is heavy and unmanageable. ° °Reduce your risk factors of vascular disease: ° °Stop smoking. If you would like help call [facility] at 1-800-QUIT-NOW (1-800-784-8669) or [place] at 336-586-4000. °Manage your cholesterol °Maintain a desired weight °Control your diabetes °Keep your blood pressure down ° °If you have any questions, please call the office at [phone] ° °

## 2021-01-14 NOTE — Discharge Summary (Signed)
Discharge Summary    Heather [name] June 09, 1941 80 y.o. female  [id number]  Admission Date: 01/10/2021  Discharge Date: 01/14/2021 Physician: Cherre Robins, MD  Admission Diagnosis: Peripheral arterial disease ([place]) [I73.9] Ischemic necrosis of toe ([place]) [I96] Atherosclerosis of native artery of left lower extremity with gangrene ([place]) [I70.262] Severe comorbid illness [R69]   HPI:   This is a 80 y.o. female  Critical limb ischemia of left lower extremity with tissue loss.  She presented to [facility] emergency room for evaluation of left fourth and fifth toe discoloration.  She reports this is been present for more than 4 weeks.  The left foot is quite painful to her.  She was seen in an outside facility and referred to [facility] vascular surgery, but did not want to wait for her appointment. She was admitted and arrangements made for angiogram.  Hospital Course:  The patient was admitted to the hospital and taken to the cath lab on 01/12/2021 and underwent: 1.  Ultrasound-guided access of right common femoral artery 2.  Aortogram including catheter selection of aorta 3.  Left lower extremity arteriogram with selection of third order branches 4.  Primary stent of left distal SFA/above knee popliteal artery high-grade stenosis with post balloon angioplasty (7 mm x 80 mm drug-coated Eluvia, postdilated with a 6 mm balloon)    The pt tolerated the procedure well and was transported to the PACU in excellent condition.   Her vital signs remained stable and she was afebrile.  She had a palpable left DP pulse.  She was tolerating her diet and laboratory work-up was stable.  Her right groin was soft without hematoma.  The remainder of the hospital course consisted of increasing mobilization and increasing intake of solids without difficulty.  On hospital day 4, she was stable for discharge home. CBC    Component Value Date/Time   WBC 9.1 01/13/2021 1139   RBC 3.24 (L) 01/13/2021 1139    HGB 10.1 (L) 01/13/2021 1139   HGB 12.1 12/15/2020 0716   HCT 30.3 (L) 01/13/2021 1139   PLT 152 01/13/2021 1139   PLT 159 12/15/2020 0716   MCV 93.5 01/13/2021 1139   MCH 31.2 01/13/2021 1139   MCHC 33.3 01/13/2021 1139   RDW 13.9 01/13/2021 1139   LYMPHSABS 2.1 01/13/2021 1139   MONOABS 0.6 01/13/2021 1139   EOSABS 0.0 01/13/2021 1139   BASOSABS 0.0 01/13/2021 1139    BMET    Component Value Date/Time   NA 138 01/13/2021 1139   K 3.9 01/13/2021 1139   CL 106 01/13/2021 1139   CO2 20 (L) 01/13/2021 1139   GLUCOSE 104 (H) 01/13/2021 1139   BUN 12 01/13/2021 1139   CREATININE 0.97 01/13/2021 1139   CREATININE 0.96 12/15/2020 0716   CALCIUM 8.4 (L) 01/13/2021 1139   GFRNONAA 59 (L) 01/13/2021 1139   GFRNONAA 60 (L) 12/15/2020 0716   GFRAA >60 03/17/2020 1102      Discharge Instructions     Discharge patient   Complete by: As directed    Discharge disposition: 01-Home or Self Care   Discharge patient date: 01/14/2021       Discharge Diagnosis:  Peripheral arterial disease ([place]) [I73.9] Ischemic necrosis of toe ([place]) [I96] Atherosclerosis of native artery of left lower extremity with gangrene ([place]) [I70.262] Severe comorbid illness [R69]  Secondary Diagnosis: Patient Active Problem List   Diagnosis Date Noted   Atherosclerosis of native artery of left lower extremity with gangrene ([place]) 01/11/2021   Adenocarcinoma  of left lung, stage 4 ([place]) 12/15/2020   Prolapsed internal hemorrhoids, grade 3 03/25/2020   Grade III hemorrhoids 11/06/2019   Primary adenocarcinoma of upper lobe of left lung ([place]) 08/07/2017   Malignant neoplasm of upper lobe of left lung ([place]) 08/01/2017   History of adenomatous polyp of colon 02/06/2017   Ascending aortic aneurysm ([place]) 07/20/2014   Non-small cell carcinoma of right lung, stage 1 ([place]) 07/20/2014   Constipation 01/07/2012   Encounter for screening colonoscopy 01/07/2012   Tobacco abuse    Lung nodule 08/21/2011   Past  Medical History:  Diagnosis Date   Arthritis    reports her joints are fine and she has no pain    Ascending aortic aneurysm ([place]) 07/20/2014   4.1 cm by CT   Atrial fib/flutter, transient    post-op   CHF (congestive heart failure) ([place])    a. EF 30-35% by echo in 2013 --> similar results by repeat imaging in 2016 and 11/2017.   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 ([place]) 09/2011   Stage IB- s/p right upper lobectomy   Pneumonia    Psoriasis    Ankles   Tobacco abuse      Allergies as of 01/14/2021       Reactions   Ibuprofen Nausea And Vomiting   Powder Rash   Gloves with powder        Medication List     TAKE these medications    acetaminophen 500 MG tablet Commonly known as: TYLENOL Take 500 mg by mouth every 6 (six) hours as needed for mild pain.   alendronate 70 MG tablet Commonly known as: FOSAMAX Take 70 mg by mouth every Monday.   aspirin 81 MG EC tablet Take 1 tablet (81 mg total) by mouth daily. Swallow whole. Start taking on: January 15, 2021   atorvastatin 40 MG tablet Commonly known as: LIPITOR Take 1 tablet (40 mg total) by mouth daily. Start taking on: January 15, 2021   cephALEXin 500 MG capsule Commonly known as: KEFLEX Take 500 mg by mouth 2 (two) times daily.   clopidogrel 75 MG tablet Commonly known as: PLAVIX Take 1 tablet (75 mg total) by mouth daily. Start taking on: January 15, 2021   Fish Oil 500 MG Caps Take 500 mg by mouth daily.   glucosamine-chondroitin 500-400 MG tablet Take 1 tablet by mouth daily.   hydrochlorothiazide 12.5 MG capsule Commonly known as: MICROZIDE Take 6.25 mg by mouth daily at 2 PM.   HYDROcodone-acetaminophen 5-325 MG tablet Commonly known as: NORCO/VICODIN Take 1 tablet by mouth 4 (four) times daily as needed for pain.   meclizine 12.5 MG tablet Commonly known as: ANTIVERT Take 12.5 mg by mouth  daily as needed for dizziness.   multivitamins with iron Tabs tablet Take 1 tablet by mouth daily.   Oyster Shell Calcium w/D 500-200 MG-UNIT Tabs Take 1 tablet by mouth 3 (three) times daily.   Tagrisso 80 MG tablet Generic drug: osimertinib mesylate Take 1 tablet (80 mg total) by mouth daily.   vitamin B-12 500 MCG tablet Commonly known as: CYANOCOBALAMIN Take 500 mcg by mouth daily.       ASK your doctor about these medications    metoprolol succinate 25 MG 24 hr tablet Commonly known as: Toprol XL Take 1 tablet (25 mg total) by mouth 2 (  two) times daily.          Instructions:  [facility]  Discharge Instructions  Lower Extremity Angiogram; Angioplasty/Stenting  Please refer to the following instructions for your post-procedure care. Your surgeon or physician assistant will discuss any changes with you.  Activity  Avoid lifting more than 8 pounds (1 gallons of milk) for 72 hours (3 days) after your procedure. You may walk as much as you can tolerate. It's OK to drive after 72 hours.  Bathing/Showering  You may shower the day after your procedure. If you have a bandage, you may remove it at 24- 48 hours. Clean your incision site with mild soap and water. Pat the area dry with a clean towel.  Diet  Resume your pre-procedure diet. There are no special food restrictions following this procedure. All patients with peripheral vascular disease should follow a low fat/low cholesterol diet. In order to heal from your surgery, it is CRITICAL to get adequate nutrition. Your body requires vitamins, minerals, and protein. Vegetables are the best source of vitamins and minerals. Vegetables also provide the perfect balance of protein. Processed food has little nutritional value, so try to avoid this.  Medications  Resume taking all of your medications unless your doctor tells you not to. If your incision is causing pain, you may take  over-the-counter pain relievers such as acetaminophen (Tylenol)  Follow Up  Follow up will be arranged at the time of your procedure. You may have an office visit scheduled or may be scheduled for surgery. Ask your surgeon if you have any questions.  Please call us immediately for any of the following conditions: Severe or worsening pain your legs or feet at rest or with walking. Increased pain, redness, drainage at your groin puncture site. Fever of 101 degrees or higher. If you have any mild or slow bleeding from your puncture site: lie down, apply firm constant pressure over the area with a piece of gauze or a clean wash cloth for 30 minutes- no peeking!, call 911 right away if you are still bleeding after 30 minutes, or if the bleeding is heavy and unmanageable.  Reduce your risk factors of vascular disease:  Stop smoking. If you would like help call [facility] at 1-800-QUIT-NOW 319-429-7980) or [place] at 682-764-1038. Manage your cholesterol Maintain a desired weight Control your diabetes Keep your blood pressure down  If you have any questions, please call the office at [phone]  Prescriptions given: Statin, Plavix and aspirin  Disposition: Home  Patient's condition: is Good  Follow up: 1. Dr. Carlis Abbott in 4 weeks with ABIs and left lower extremity arterial duplex   Risa Grill, PA-C [facility] [phone] 01/14/2021  12:02 PM

## 2021-01-14 NOTE — Progress Notes (Signed)
Mobility Specialist: Progress Note   01/14/21 1656  Mobility  Activity Ambulated in hall  Level of Assistance Standby assist, set-up cues, supervision of patient - no hands on  Assistive Device Four wheel walker  Distance Ambulated (ft) 300 ft  Mobility Ambulated with assistance in hallway  Mobility Response Tolerated well  Mobility performed by Mobility specialist  $Mobility charge 1 Mobility   Pre-Mobility on 2L/min: 69 HR, 99% SpO2 During Mobility:            2L/min [place]: 70 HR, 97% SpO2           1 L/min [name]: 68 HR, 95% SpO2 Post-Mobility on RA: 84 HR, 90-94% SpO2  Pt independent to EOB as well as to stand. Pt required one seated break during ambulation d/t fatigue lasting roughly 1 minute. Pt back to bed after walk per request with call bell at her side.   [name] Mobility Specialist Mobility Specialist Phone: [phone]

## 2021-01-14 NOTE — Progress Notes (Addendum)
Pt is hemodynamically stable, remains afebrile.  Per RN day shift reported, we had tried to wean of O2 NCL to room air, but her SPO2 was dropped in 86-88% with room air and had some disorientation on day shift yesterday. We are aware about possibly CO2 retention. Pt also complained about SOB with exertion.  To night Pt is on 2 LPM of O2 NCL On 2 LPM of NCL, Spo2 95-96%.  Pt has been alert and oriented x 4, cooperated.  Right groin incision is dry and clean, with small area of bruising, no hematoma presented. MD aware. Denied pain. No distress noted.We will monitor.  Kennyth Lose, RN

## 2021-01-14 NOTE — Progress Notes (Deleted)
[place], [place], MD [address] [place] [address]  DIAGNOSIS: 1) Stage IA non-small cell lung, adenocarcinoma diagnosed in March 2013. 2) non-small cell lung cancer, adenocarcinoma in January 2019  2) Non-Small Cell lung Cancer, adenocarcinoma, in the left upper lobe suspicious for stage IA.  She also has some other small pulmonary nodules that could be low-grade adenocarcinoma.    Biomarker Findings Microsatellite status - Cannot Be Determined ? Tumor Mutational Burden - 3 Muts/Mb Genomic Findings For a complete list of the genes assayed, please refer to the Appendix. EGFR L858R MTAP loss exons 3-8 CDKN2A/B CDKN2B loss, CDKN2A loss SMAD4 Q534* TP53 R248L 7 Disease relevant genes with no reportable alterations: ALK, BRAF, ERBB2, KRAS, MET, RET, ROS1   PDL1 expression: 1%  PRIOR THERAPY:  1) status post right upper lobectomy with lymph node dissection on September 24, 2011 2) status post stereotactic radiotherapy to the left upper lobe lung nodule in February 2019.   CURRENT THERAPY: Targeted treatment with Tagrisso 80 mg p.o. daily.  First dose expected on 12/17/20?  INTERVAL HISTORY: Heather [name] 80 y.o. female returns to clinic today for follow-up visit.  The patient was last seen in the clinic on 12/15/2020.  Unfortunately in the interval, the patient has had significant issues related to peripheral vascular disease.  It started on 12/22/2020 when the patient presented to the emergency department after dropping a can of chili on her left foot.  She then presented to the emergency room again on 12/30/2020 due to worsening pain and swollen and painful fourth and fifth toes.  She was treated for cellulitis.  She then was seen in the hospital again on 01/02/2021 and admitted for ischemic toe and cellulitis. Patient was to schedule follow-up with [facility] vascular surgery for revascularization however not deemed emergent at  time of her hospitalization.  Patient reports that she has had severe pain and increasing discoloration of her toes.  She has not been able to make her appointment with [facility] vascular surgery and presented to [facility] for increasing and worsening pain with ischemic left toes on 01/12/21.  She was seen in an outside facility and referred to [facility] vascular surgery, but did not want to wait for her appointment. She was admitted and arrangements made for angiogram. She was discharged on 01/14/21.  She is followed closely by vascular surgery.  Otherwise, at the patient's last appointment regarding her lung cancer, the patient was started on targeted treatment with Tagrisso.  Her first dose was on 12/17/2020 regarding her Tagrisso, the patient denies any abnormal side effects.  She denies any rashes or skin changes.  She denies any fever, chills, night sweats, weight loss.  She reports a cough and occasional blood-tinged sputum which is new.  She reports her baseline dyspnea on exertion.  She denies any chest pain.  She denies any nausea, vomiting, diarrhea, or constipation.  She denies any headache or visual changes.  The patient is here today for evaluation and repeat blood work to manage any adverse side effects of treatment from [place].    MEDICAL HISTORY: Past Medical History:  Diagnosis Date   Arthritis    reports her joints are fine and she has no pain    Ascending aortic aneurysm ([place]) 07/20/2014   4.1 cm by CT   Atrial fib/flutter, transient    post-op   CHF (congestive heart failure) (HCC)    a. EF 30-35% by echo in  2013 --> similar results by repeat imaging in 2016 and 11/2017.   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 ([place]) 09/2011   Stage IB- s/p right upper lobectomy   Pneumonia    Psoriasis    Ankles   Tobacco abuse     ALLERGIES:  is allergic to ibuprofen and powder.  MEDICATIONS:   No current facility-administered medications for this visit.   Current Outpatient Medications  Medication Sig Dispense Refill   [START ON 01/15/2021] aspirin EC 81 MG EC tablet Take 1 tablet (81 mg total) by mouth daily. Swallow whole. 30 tablet 11   [START ON 01/15/2021] atorvastatin (LIPITOR) 40 MG tablet Take 1 tablet (40 mg total) by mouth daily. 30 tablet 6   [START ON 01/15/2021] clopidogrel (PLAVIX) 75 MG tablet Take 1 tablet (75 mg total) by mouth daily. 30 tablet 6   Facility-Administered Medications Ordered in Other Visits  Medication Dose Route Frequency Provider Last Rate Last Admin   0.9 %  sodium chloride infusion  250 mL Intravenous PRN Marty Heck, MD       acetaminophen (TYLENOL) tablet 650 mg  650 mg Oral Q6H Marty Heck, MD   650 mg at 01/14/21 1200   acetaminophen (TYLENOL) tablet 650 mg  650 mg Oral Q4H PRN Marty Heck, MD       alum & mag hydroxide-simeth (MAALOX/MYLANTA) 200-200-20 MG/5ML suspension 15-30 mL  15-30 mL Oral Q2H PRN Marty Heck, MD       aspirin EC tablet 81 mg  81 mg Oral Daily Marty Heck, MD   81 mg at 01/14/21 0935   atorvastatin (LIPITOR) tablet 40 mg  40 mg Oral Daily Marty Heck, MD   40 mg at 01/14/21 0934   clopidogrel (PLAVIX) tablet 75 mg  75 mg Oral Daily Cherre Robins, MD   75 mg at 01/14/21 0934   guaiFENesin-dextromethorphan (ROBITUSSIN DM) 100-10 MG/5ML syrup 15 mL  15 mL Oral Q4H PRN Marty Heck, MD   15 mL at 01/12/21 1832   heparin injection 5,000 Units  5,000 Units Subcutaneous Q8H Marty Heck, MD   5,000 Units at 01/14/21 1610   hydrALAZINE (APRESOLINE) injection 5 mg  5 mg Intravenous Q20 Min PRN Marty Heck, MD       labetalol (NORMODYNE) injection 10 mg  10 mg Intravenous Q10 min PRN Marty Heck, MD       lactated ringers infusion   Intravenous Continuous Marty Heck, MD 125 mL/hr at 01/12/21 0207 New Bag at 01/12/21 0207   meclizine  (ANTIVERT) tablet 12.5 mg  12.5 mg Oral PRN Dagoberto Ligas, PA-C   12.5 mg at 01/13/21 1049   metoprolol succinate (TOPROL-XL) 24 hr tablet 25 mg  25 mg Oral BID Marty Heck, MD   25 mg at 01/14/21 0935   metoprolol tartrate (LOPRESSOR) injection 2-5 mg  2-5 mg Intravenous Q2H PRN Marty Heck, MD       ondansetron [facility]) injection 4 mg  4 mg Intravenous Q6H PRN Marty Heck, MD   4 mg at 01/12/21 1853   osimertinib mesylate (TAGRISSO) tablet 80 mg (**patient supplied med**)  80 mg Oral Daily Marty Heck, MD   80 mg at 01/14/21 0556   oxyCODONE (Oxy IR/ROXICODONE) immediate release tablet 5-10 mg  5-10 mg  Oral Q4H PRN Marty Heck, MD   5 mg at 01/14/21 1350   pantoprazole (PROTONIX) EC tablet 40 mg  40 mg Oral Daily Marty Heck, MD   40 mg at 01/14/21 0934   phenol (CHLORASEPTIC) mouth spray 1 spray  1 spray Mouth/Throat PRN Marty Heck, MD       sodium chloride flush (NS) 0.9 % injection 3 mL  3 mL Intravenous Q12H Marty Heck, MD   3 mL at 01/14/21 0935   sodium chloride flush (NS) 0.9 % injection 3 mL  3 mL Intravenous PRN Marty Heck, MD        SURGICAL HISTORY:  Past Surgical History:  Procedure Laterality Date   ABDOMINAL AORTOGRAM W/LOWER EXTREMITY N/A 01/12/2021   Procedure: ABDOMINAL AORTOGRAM W/LOWER EXTREMITY;  Surgeon: Marty Heck, MD;  Location: [place] [facility];  Service: Cardiovascular;  Laterality: N/A;   CATARACT EXTRACTION Bilateral 2016   COLONOSCOPY  01/22/2012   Procedure: COLONOSCOPY;  Surgeon: Danie Binder, MD;  [facility];  Service: Endoscopy;  Laterality: N/A;  10:45   COLONOSCOPY N/A 04/15/2017   Four 4-6 mm polyps in descending colon, ascending, and cecum. One 15 mm polyp in proximal ascending colon, one 4 mm poylp at hepatic flexure, two 3-4 mm polyps in sigmoid and descending. Internal hemorrhoids. Colonoscopy in 3-5 years if benefits outweigh risks. Simple  adenomas.    IR RADIOLOGIST EVAL & MGMT  03/04/2018   PERIPHERAL VASCULAR INTERVENTION Left 01/12/2021   Procedure: PERIPHERAL VASCULAR INTERVENTION;  Surgeon: Marty Heck, MD;  Location: [place] [facility];  Service: Cardiovascular;  Laterality: Left;  sfa   POLYPECTOMY  04/15/2017   Procedure: POLYPECTOMY;  Surgeon: Danie Binder, MD;  [facility]: [facility];  Service: Endoscopy;;  ascending colon   RADIOLOGY WITH ANESTHESIA N/A 03/19/2018   Procedure: CT MICROWAVE THERMAL ABLATION WITH ANESTHESIA;  Surgeon: Jacqulynn Cadet, MD;  Location: [facility];  Service: Anesthesiology;  Laterality: N/A;   right VATS (upper lobectomy)  08/29/11   Dr Roxan Hockey   VIDEO BRONCHOSCOPY WITH ENDOBRONCHIAL NAVIGATION N/A 03/18/2015   Procedure: VIDEO BRONCHOSCOPY WITH ENDOBRONCHIAL NAVIGATION;  Surgeon: [name], MD;  Location: [place];  Service: Thoracic;  Laterality: N/A;   VIDEO BRONCHOSCOPY WITH ENDOBRONCHIAL NAVIGATION N/A 11/17/2020   Procedure: VIDEO BRONCHOSCOPY WITH ENDOBRONCHIAL NAVIGATION;  Surgeon: [name], MD;  Location: [place];  Service: Thoracic;  Laterality: N/A;    REVIEW OF SYSTEMS:   Review of Systems  Constitutional: Negative for appetite change, chills, fatigue, fever and unexpected weight change.  HENT:   Negative for mouth sores, nosebleeds, sore throat and trouble swallowing.   Eyes: Negative for eye problems and icterus.  Respiratory: Negative for cough, hemoptysis, shortness of breath and wheezing.   Cardiovascular: Negative for chest pain and leg swelling.  Gastrointestinal: Negative for abdominal pain, constipation, diarrhea, nausea and vomiting.  Genitourinary: Negative for bladder incontinence, difficulty urinating, dysuria, frequency and hematuria.   Musculoskeletal: Negative for back pain, gait problem, neck pain and neck stiffness.  Skin: Negative for itching and rash.  Neurological: Negative for dizziness, extremity weakness, gait problem,  headaches, light-headedness and seizures.  Hematological: Negative for adenopathy. Does not bruise/bleed easily.  Psychiatric/Behavioral: Negative for confusion, depression and sleep disturbance. The patient is not nervous/anxious.     PHYSICAL EXAMINATION:  There were no vitals taken for this visit.  ECOG PERFORMANCE STATUS: {CHL ONC ECOG [id number]  Physical Exam  Constitutional: Oriented to  person, place, and time and well-developed, well-nourished, and in no distress. No distress.  HENT:  Head: Normocephalic and atraumatic.  Mouth/Throat: Oropharynx is clear and moist. No oropharyngeal exudate.  Eyes: Conjunctivae are normal. Right eye exhibits no discharge. Left eye exhibits no discharge. No scleral icterus.  Neck: Normal range of motion. Neck supple.  Cardiovascular: Normal rate, regular rhythm, normal heart sounds and intact distal pulses.   Pulmonary/Chest: Effort normal and breath sounds normal. No respiratory distress. No wheezes. No rales.  Abdominal: Soft. Bowel sounds are normal. Exhibits no distension and no mass. There is no tenderness.  Musculoskeletal: Normal range of motion. Exhibits no edema.  Lymphadenopathy:    No cervical adenopathy.  Neurological: Alert and oriented to person, place, and time. Exhibits normal muscle tone. Gait normal. Coordination normal.  Skin: Skin is warm and dry. No rash noted. Not diaphoretic. No erythema. No pallor.  Psychiatric: Mood, memory and judgment normal.  Vitals reviewed.  LABORATORY DATA: Lab Results  Component Value Date   WBC 9.1 01/13/2021   HGB 10.1 (L) 01/13/2021   HCT 30.3 (L) 01/13/2021   MCV 93.5 01/13/2021   PLT 152 01/13/2021      Chemistry      Component Value Date/Time   NA 138 01/13/2021 1139   K 3.9 01/13/2021 1139   CL 106 01/13/2021 1139   CO2 20 (L) 01/13/2021 1139   BUN 12 01/13/2021 1139   CREATININE 0.97 01/13/2021 1139   CREATININE 0.96 12/15/2020 0716      Component Value Date/Time    CALCIUM 8.4 (L) 01/13/2021 1139   ALKPHOS 67 01/11/2021 1140   AST 38 01/11/2021 1140   AST 18 12/15/2020 0716   ALT 23 01/11/2021 1140   ALT 9 12/15/2020 0716   BILITOT 1.4 (H) 01/11/2021 1140   BILITOT 0.9 12/15/2020 0716       RADIOGRAPHIC STUDIES:  DG Chest 2 View  Result Date: 01/11/2021 CLINICAL DATA:  Hemoptysis.  History of lung cancer EXAM: CHEST - 2 VIEW COMPARISON:  11/15/2020 FINDINGS: Cardiomegaly. Stable aortic and hilar contours. Interstitial coarsening with chronic patchy bilateral pulmonary opacity greatest at the left apex and right base. No Kerley lines, effusion, or pneumothorax. IMPRESSION: 1. No localized source of hemoptysis. 2. Chronic cardiomegaly and bilateral pulmonary opacities. Electronically Signed   By: Monte Fantasia M.D.   On: 01/11/2021 04:27   PERIPHERAL VASCULAR CATHETERIZATION  Result Date: 01/12/2021 Formatting of this result is different from the original. Patient name: Heather [name]         MRN: [id number]        DOB: 20-Dec-1940          Sex: female   01/12/2021 Pre-operative Diagnosis: Critical limb ischemia of left lower extremity with tissue loss Post-operative diagnosis:  Same Surgeon:  Marty Heck, MD Procedure Performed: 1.  Ultrasound-guided access of right common femoral artery 2.  Aortogram including catheter selection of aorta 3.  Left lower extremity arteriogram with selection of third order branches 4.  Primary stent of left distal SFA/above knee popliteal artery high-grade stenosis with post balloon angioplasty (7 mm x 80 mm drug-coated Eluvia, postdilated with a 6 mm balloon)   Indications: 80 year old female seen in consultation yesterday by Dr. Stanford Breed with concern for ischemic left lower extremity.  She presents today for left lower extremity arteriogram plus intervention after risks benefits discussed.   Findings: Aortogram showed patent renal arteries and no flow-limiting stenosis in the aortoiliac segment..  Did have ectatic segment  in the visceral segment of her abdominal aorta.   Left lower extremity runoff showed a patent common femoral and profunda with a focal high-grade greater than 80% stenosis in the distal SFA at Hunter's canal with some associated acute thrombus.  Distally the popliteal artery was widely patent with at least two-vessel runoff in the anterior tibial and posterior tibial artery given the peroneal was diminutive in the mid calf.   The lesion was crossed and primarily stented with a 7 mm x 80 mm drug coated Eluvia that was postdilated with a 6 mm angioplasty balloon.  I elected primary simply stent given concern for acute thrombus associated with this lesion             Procedure:  The patient was identified in the holding area and taken to room 8.  The patient was then placed supine on the table and prepped and draped in the usual sterile fashion.  A time out was called.  Ultrasound was used to evaluate the right common femoral artery.  It was patent .  A digital ultrasound image was acquired.  A micropuncture needle was used to access the right common femoral artery under ultrasound guidance.  An 018 wire was advanced without resistance and a micropuncture sheath was placed.  The 018 wire was removed and a benson wire was placed.  The micropuncture sheath was exchanged for a 5 french sheath.  An omniflush catheter was advanced over the wire to the level of L-1.  An abdominal angiogram was obtained.  Next, using the omniflush catheter and a benson wire, the aortic bifurcation was crossed and the catheter was placed into theleft external iliac artery and left runoff was obtained.  Ultimately identified a high-grade greater than 80% stenosis in the distal left SFA at Hunter's canal.  We elected to treat this with primary stent placement.  A Glidewire advantage was then used to exchange for a long 6 French sheath in the right groin over the aortic bifurcation.  Patient was given 100 units/kg IV heparin.  I then crossed the  SFA lesion with a Glidewire advantage and confirmed with hand-injection after we crossed the lesion.  This was then primarily stented with a 7 mm x 80 mm Eluvia postdilated with a 6 mm [place].  Final results are widely patent stent with no residual stenosis and preserved two-vessel runoff distally.  She was taken to holding to have sheath removed.   Complication: None   Condition: Stable         Marty Heck, MD [facility] [place] Office: [phone]    VAS [place] ABI WITH/WO TBI  Result Date: 01/12/2021  LOWER EXTREMITY DOPPLER STUDY Patient Name:  Heather [name]  Date of Exam:   01/11/2021 Medical Rec #: [id number]        Accession #:    [id number] Date of Birth: Jan 15, 1941        Patient Gender: F Patient Age:   79Y Exam [facility] Procedure:      VAS [place] ABI WITH/WO TBI Referring Phys: [id number] Yevonne Aline HAWKEN --------------------------------------------------------------------------------  Indications: Ulceration. High Risk Factors: Hypertension, Diabetes, past history of smoking. Other Factors: History of lung cancer.  Comparison Study: No prior study Performing Technologist: Sharion Dove RVS  Examination Guidelines: A complete evaluation includes at minimum, Doppler waveform signals and systolic blood pressure reading at the level of bilateral brachial, anterior tibial, and posterior tibial arteries, when vessel segments are accessible. Bilateral testing  is considered an integral part of a complete examination. Photoelectric Plethysmograph (PPG) waveforms and toe systolic pressure readings are included as required and additional duplex testing as needed. Limited examinations for reoccurring indications may be performed as noted.  ABI Findings: +---------+------------------+-----+-----------+--------+ Right    Rt Pressure (mmHg)IndexWaveform   Comment  +---------+------------------+-----+-----------+--------+ Brachial 131                     multiphasic         +---------+------------------+-----+-----------+--------+ PTA      0                 0.00 absent              +---------+------------------+-----+-----------+--------+ DP       149               1.08 multiphasic         +---------+------------------+-----+-----------+--------+ Great Toe127               0.92                     +---------+------------------+-----+-----------+--------+ +---------+------------------+-----+-----------+-------+ Left     Lt Pressure (mmHg)IndexWaveform   Comment +---------+------------------+-----+-----------+-------+ Brachial 138                    multiphasic        +---------+------------------+-----+-----------+-------+ ATA      108               0.78 monophasic         +---------+------------------+-----+-----------+-------+ DP       97                0.70 monophasic         +---------+------------------+-----+-----------+-------+ Great Toe0                 0.00                    +---------+------------------+-----+-----------+-------+ +-------+-----------+-----------+------------+------------+ ABI/TBIToday's ABIToday's TBIPrevious ABIPrevious TBI +-------+-----------+-----------+------------+------------+ Right  1.08       0.92                                +-------+-----------+-----------+------------+------------+ Left   0.78       0.00                                +-------+-----------+-----------+------------+------------+  Summary: Right: Resting right ankle-brachial index is within normal range. No evidence of significant right lower extremity arterial disease. The right toe-brachial index is normal. No flow noted in the posterior tibial artery using the imager from the distal calf to the foot. Left: Resting left ankle-brachial index indicates moderate left lower extremity arterial disease. The left toe-brachial index is abnormal.  *See table(s) above for measurements and observations.   Electronically signed by Monica Martinez MD on 01/12/2021 at 5:37:21 PM.    Final      ASSESSMENT/PLAN:  This is a very pleasant 80 year old African-American female diagnosed originally with non-small cell lung cancer, adenocarcinoma in the right upper lobe in March 2013.  She also was diagnosed with non-small cell lung cancer, adenocarcinoma on the left upper lobe status post stereotactic radiotherapy to this lesion in 2019. She has a new left pulmonary nodule which was positive for adenocarcinoma and another focus in the right lower lobe that  is also suspicious for adenocarcinoma which was diagnosed in May 2022.  The patient's molecular studies shows that she is positive for an EGFR mutation.   The patient was started on Tagrisso 80 mg p.o. daily her first dose was on 12/17/2020.  The patient was seen with Dr. Julien Nordmann today.  Labs reviewed.  Recommend that she_continue on the same treatment at the same dose.  I will arrange for restaging CT scan of the chest prior to her next follow-up visit.  We will see him back for follow-up visit in_weeks for evaluation and to review her scan results.  She will continue to follow with vascular regarding her peripheral vascular disease.  The patient was advised to call immediately if she has any concerning symptoms in the interval. The patient voices understanding of current disease status and treatment options and is in agreement with the current care plan. All questions were answered. The patient knows to call the clinic with any problems, questions or concerns. We can certainly see the patient much sooner if necessary    No orders of the defined types were placed in this encounter.    I spent {CHL ONC TIME VISIT - DIYME:[id number]} counseling the patient face to face. The total time spent in the appointment was {CHL ONC TIME VISIT - KGSUP:[id number]}.  Ivee Poellnitz L Ruther Ephraim, PA-C 01/14/21

## 2021-01-15 DIAGNOSIS — Z7982 Long term (current) use of aspirin: Secondary | ICD-10-CM | POA: Diagnosis not present

## 2021-01-15 DIAGNOSIS — I2510 Atherosclerotic heart disease of native coronary artery without angina pectoris: Secondary | ICD-10-CM | POA: Diagnosis not present

## 2021-01-15 DIAGNOSIS — D649 Anemia, unspecified: Secondary | ICD-10-CM | POA: Diagnosis not present

## 2021-01-15 DIAGNOSIS — I70262 Atherosclerosis of native arteries of extremities with gangrene, left leg: Secondary | ICD-10-CM | POA: Diagnosis not present

## 2021-01-15 DIAGNOSIS — I10 Essential (primary) hypertension: Secondary | ICD-10-CM | POA: Diagnosis not present

## 2021-01-15 DIAGNOSIS — Z89422 Acquired absence of other left toe(s): Secondary | ICD-10-CM | POA: Diagnosis not present

## 2021-01-15 DIAGNOSIS — I480 Paroxysmal atrial fibrillation: Secondary | ICD-10-CM | POA: Diagnosis not present

## 2021-01-15 DIAGNOSIS — J449 Chronic obstructive pulmonary disease, unspecified: Secondary | ICD-10-CM | POA: Diagnosis not present

## 2021-01-15 DIAGNOSIS — C3412 Malignant neoplasm of upper lobe, left bronchus or lung: Secondary | ICD-10-CM | POA: Diagnosis not present

## 2021-01-15 DIAGNOSIS — I5042 Chronic combined systolic (congestive) and diastolic (congestive) heart failure: Secondary | ICD-10-CM | POA: Diagnosis not present

## 2021-01-15 DIAGNOSIS — Z743 Need for continuous supervision: Secondary | ICD-10-CM | POA: Diagnosis not present

## 2021-01-15 DIAGNOSIS — Z85118 Personal history of other malignant neoplasm of bronchus and lung: Secondary | ICD-10-CM | POA: Diagnosis not present

## 2021-01-15 DIAGNOSIS — E46 Unspecified protein-calorie malnutrition: Secondary | ICD-10-CM | POA: Diagnosis not present

## 2021-01-15 DIAGNOSIS — R627 Adult failure to thrive: Secondary | ICD-10-CM | POA: Diagnosis not present

## 2021-01-15 DIAGNOSIS — Z20822 Contact with and (suspected) exposure to covid-19: Secondary | ICD-10-CM | POA: Diagnosis not present

## 2021-01-15 DIAGNOSIS — R5381 Other malaise: Secondary | ICD-10-CM | POA: Diagnosis not present

## 2021-01-15 DIAGNOSIS — I739 Peripheral vascular disease, unspecified: Secondary | ICD-10-CM | POA: Diagnosis not present

## 2021-01-15 DIAGNOSIS — C3492 Malignant neoplasm of unspecified part of left bronchus or lung: Secondary | ICD-10-CM | POA: Diagnosis not present

## 2021-01-15 DIAGNOSIS — I110 Hypertensive heart disease with heart failure: Secondary | ICD-10-CM | POA: Diagnosis not present

## 2021-01-15 DIAGNOSIS — Z4781 Encounter for orthopedic aftercare following surgical amputation: Secondary | ICD-10-CM | POA: Diagnosis not present

## 2021-01-15 DIAGNOSIS — I96 Gangrene, not elsewhere classified: Secondary | ICD-10-CM | POA: Diagnosis not present

## 2021-01-15 DIAGNOSIS — Z7902 Long term (current) use of antithrombotics/antiplatelets: Secondary | ICD-10-CM | POA: Diagnosis not present

## 2021-01-15 DIAGNOSIS — I509 Heart failure, unspecified: Secondary | ICD-10-CM | POA: Diagnosis not present

## 2021-01-15 DIAGNOSIS — M6281 Muscle weakness (generalized): Secondary | ICD-10-CM | POA: Diagnosis not present

## 2021-01-15 DIAGNOSIS — S99822A Other specified injuries of left foot, initial encounter: Secondary | ICD-10-CM | POA: Diagnosis not present

## 2021-01-15 NOTE — Progress Notes (Signed)
Mobility Specialist: Progress Note   01/15/21 1744  Mobility  Activity Ambulated in hall  Level of Assistance Standby assist, set-up cues, supervision of patient - no hands on  Assistive Device Four wheel walker  Distance Ambulated (ft) 370 ft  Mobility Ambulated with assistance in hallway  Mobility Response Tolerated fair  Mobility performed by Mobility specialist  $Mobility charge 1 Mobility   During Mobility on 3 L/min [place]: 99 HR, 98% SpO2  Pt to BSC and then agreeable to ambulate, BM successful. Pt ambulated on 3 L/min [place]. Pt required four seated breaks d/t SOB lasting 1-2 minutes each. Pt back to bed after walk with call bell at her side. Pt is requesting pain medication, RN notified.   [facility] Heather [name] Mobility Specialist Mobility Specialist Phone: [phone]

## 2021-01-15 NOTE — Progress Notes (Signed)
[facility] [place] PROGRESS NOTE  ASSESSMENT / PLAN: Heather [name] is a 80 y.o. 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  She is deconditioned and requires transfer to SNF.  She is medically ready for SNF.  SUBJECTIVE: Well-appearing.  No complaints.  Discussed disposition plan with daughter at bedside via telephone.  OBJECTIVE: BP (!) 142/95 (BP Location: Right Arm)   Pulse 65   Temp 97.9 F (36.6 C) (Oral)   Resp 18   SpO2 100%   No distress Regular rate and rhythm Unlabored Access site soft.  Dressing removed. 2+ left DP  CBC Latest Ref Rng & Units 01/13/2021 01/11/2021 01/10/2021  WBC 4.0 - 10.5 K/uL 9.1 10.7(H) 10.8(H)  Hemoglobin 12.0 - 15.0 g/dL 10.1(L) 11.1(L) 11.6(L)  Hematocrit 36.0 - 46.0 % 30.3(L) 34.1(L) 34.4(L)  Platelets 150 - 400 K/uL 152 120(L) 140(L)     CMP Latest Ref Rng & Units 01/13/2021 01/11/2021 01/10/2021  Glucose 70 - 99 mg/dL 104(H) 144(H) 142(H)  BUN 8 - 23 mg/dL 12 7(L) 5(L)  Creatinine 0.44 - 1.00 mg/dL 0.97 0.87 0.97  Sodium 135 - 145 mmol/L 138 138 133(L)  Potassium 3.5 - 5.1 mmol/L 3.9 3.6 3.4(L)  Chloride 98 - 111 mmol/L 106 103 98  CO2 22 - 32 mmol/L 20(L) 23 24  Calcium 8.9 - 10.3 mg/dL 8.4(L) 9.3 9.2  Total Protein 6.5 - 8.1 g/dL - 6.5 6.6  Total Bilirubin 0.3 - 1.2 mg/dL - 1.4(H) 1.8(H)  Alkaline Phos 38 - 126 U/L - 67 69  AST 15 - 41 U/L - 38 36  ALT 0 - 44 U/L - 23 22   Quavis Klutz N. Stanford Breed, MD [facility] Phone Number: [phone] 01/15/2021 10:31 AM

## 2021-01-15 NOTE — TOC Progression Note (Addendum)
Transition of Care [facility]) - Progression Note    Patient Details  Name: Heather [name] MRN: [id number] Date of Birth: 16-Mar-1941  Transition of Care [facility]) CM/SW [place], [place] Phone Number: 01/15/2021, 10:17 AM  Clinical Narrative:     SW informed pt now agreeable to SNF.   SW spoke with pt's daughter Heather [name] [phone]) to go over preferred SNF's. Heather [name] reports pt/family preferred is [facility] in [place]. Pt's last resort is [facility]. As pt would prefer to stay close to home. SW read off list of SNF's in 30 mile radius to pt's zipcode. SW answered all questions.  Candace Cruise started Auth ID #[id number]  Awaiting bed offers  Update 1143am SW received call from Elmyra Ricks [facility] [phone]) reports everything is complete for auth just needs name and NPI for facility. SW informed Elmyra Ricks, pt currently has no bed offers. Elmyra Ricks requested SW call [facility] when facility chosen.  Expected Discharge Plan: Skilled Nursing Facility Barriers to Discharge: SNF Pending bed offer  Expected Discharge Plan and Services Expected Discharge Plan: [place] In-house Referral: Clinical Social Work Discharge Planning Services: CM Consult Post Acute Care Choice: [place] arrangements for the past 2 months: Single Family Home Expected Discharge Date: 01/14/21                                     Social Determinants of Health (SDOH) Interventions    Readmission Risk Interventions No flowsheet data found.

## 2021-01-16 ENCOUNTER — Telehealth: Payer: Self-pay | Admitting: Physician Assistant

## 2021-01-16 ENCOUNTER — Inpatient Hospital Stay: Payer: Medicare Other

## 2021-01-16 ENCOUNTER — Inpatient Hospital Stay: Payer: Medicare Other | Admitting: Physician Assistant

## 2021-01-16 LAB — CULTURE, BLOOD (ROUTINE X 2)
Culture: NO GROWTH
Culture: NO GROWTH
Special Requests: ADEQUATE
Special Requests: ADEQUATE

## 2021-01-16 NOTE — Progress Notes (Signed)
Mobility Specialist: Progress Note   01/16/21 1522  Mobility  Activity Ambulated in room  Level of Assistance Standby assist, set-up cues, supervision of patient - no hands on  Assistive Device Four wheel walker  Distance Ambulated (ft) 60 ft (30'x2)  Mobility Ambulated with assistance in room  Mobility Response Tolerated well  Mobility performed by Mobility specialist  $Mobility charge 1 Mobility   Pre-Mobility: 80 HR, 98% SpO2  Pt c/o feeling SOB during ambulation, otherwise asx. Pt took one seated break lasting 1-2 minutes between each bout. Pt back to bed after walk with call bell and phone in reach.   [employer] Kaytelyn Glore Mobility Specialist Mobility Specialist Phone: [phone]

## 2021-01-16 NOTE — Progress Notes (Signed)
PT Cancellation Note  Patient Details Name: Heather [name] MRN: [id number] DOB: 1941/06/07   Cancelled Treatment:    Reason Eval/Treat Not Completed: Patient declined, no reason specified. Patient declines all PT this am. States she just got something for pain, does not want to do anything. Will re-attempt later today as time allows.    Tanaysha Alkins 01/16/2021, 11:14 AM

## 2021-01-16 NOTE — Telephone Encounter (Signed)
Patient said she would call her back when she gets out the hospital to scheduled appointment.

## 2021-01-16 NOTE — TOC Progression Note (Signed)
Transition of Care [facility]) - Progression Note    Patient Details  Name: Heather [name] MRN: [id number] Date of Birth: 1940/11/30  Transition of Care [facility]) CM/SW [place], [place] Phone Number: 01/16/2021, 5:09 PM  Clinical Narrative:     CSW spoke with patient's daughter, informed [facility] has no availability - she states she visit with patient tomorrow and make SNF choice.  Thurmond Butts, MSW, LCSW Clinical Social Worker    Expected Discharge Plan: Skilled Nursing Facility Barriers to Discharge: SNF Pending bed offer  Expected Discharge Plan and Services Expected Discharge Plan: [place] In-house Referral: Clinical Social Work Discharge Planning Services: CM Consult Post Acute Care Choice: [place] arrangements for the past 2 months: Single Family Home Expected Discharge Date: 01/14/21                                     Social Determinants of Health (SDOH) Interventions    Readmission Risk Interventions No flowsheet data found.

## 2021-01-16 NOTE — Progress Notes (Addendum)
  Progress Note    01/16/2021 1:51 PM 4 Days Post-Op  Subjective:  Has decided she now wants to proceed with 4th, 5th toe amputation. Requesting chaplin visit.   Vitals:   01/16/21 0749 01/16/21 1110  BP: (!) 154/96 (!) 156/98  Pulse: 80   Resp: 15 16  Temp: (!) 97.4 F (36.3 C) 97.8 F (36.6 C)  SpO2: 93% 94%    Physical Exam: General appearance: Awake, alert in no apparent distress Cardiac: Heart rate and rhythm are regular Respirations: Nonlabored Extremities: Both feet are warm with intact sensation and motor function.  Ischemic changes noted and unchanged. Pulse/Doppler exam: Palpable, 2+ left DP pulse  CBC    Component Value Date/Time   WBC 9.1 01/13/2021 1139   RBC 3.24 (L) 01/13/2021 1139   HGB 10.1 (L) 01/13/2021 1139   HGB 12.1 12/15/2020 0716   HCT 30.3 (L) 01/13/2021 1139   PLT 152 01/13/2021 1139   PLT 159 12/15/2020 0716   MCV 93.5 01/13/2021 1139   MCH 31.2 01/13/2021 1139   MCHC 33.3 01/13/2021 1139   RDW 13.9 01/13/2021 1139   LYMPHSABS 2.1 01/13/2021 1139   MONOABS 0.6 01/13/2021 1139   EOSABS 0.0 01/13/2021 1139   BASOSABS 0.0 01/13/2021 1139    BMET    Component Value Date/Time   NA 138 01/13/2021 1139   K 3.9 01/13/2021 1139   CL 106 01/13/2021 1139   CO2 20 (L) 01/13/2021 1139   GLUCOSE 104 (H) 01/13/2021 1139   BUN 12 01/13/2021 1139   CREATININE 0.97 01/13/2021 1139   CREATININE 0.96 12/15/2020 0716   CALCIUM 8.4 (L) 01/13/2021 1139   GFRNONAA 59 (L) 01/13/2021 1139   GFRNONAA 60 (L) 12/15/2020 0716   GFRAA >60 03/17/2020 1102     Intake/Output Summary (Last 24 hours) at 01/16/2021 1351 Last data filed at 01/15/2021 1408 Gross per 24 hour  Intake 240 ml  Output --  Net 240 ml    HOSPITAL MEDICATIONS Scheduled Meds:  acetaminophen  650 mg Oral Q6H   aspirin EC  81 mg Oral Daily   atorvastatin  40 mg Oral Daily   clopidogrel  75 mg Oral Daily   heparin  5,000 Units Subcutaneous Q8H   metoprolol succinate  25 mg Oral  BID   osimertinib mesylate  80 mg Oral Daily   pantoprazole  40 mg Oral Daily   sodium chloride flush  3 mL Intravenous Q12H   Continuous Infusions:  sodium chloride     lactated ringers 125 mL/hr at 01/12/21 0207   PRN Meds:.sodium chloride, acetaminophen, alum & mag hydroxide-simeth, guaiFENesin-dextromethorphan, hydrALAZINE, labetalol, meclizine, metoprolol tartrate, ondansetron, oxyCODONE, phenol, sodium chloride flush  Assessment and Plan: Primary stent of left distal SFA/above knee popliteal artery high-grade stenosis with post balloon angioplasty. 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 Tentative plan for left 4,5 toe amp on Wednesday. Continue with plans for SNF placement.  -DVT prophylaxis:  heparin [place]  Risa Grill, PA-C [facility] [phone] 01/16/2021  1:51 PM   VASCULAR STAFF ADDENDUM: Late entry I have independently interviewed and examined the patient. I agree with the above.  Now interested in 4/5 toe amputation. Will do this Wednesday. Continue SNF placement. OK to go to SNF after toe amputation.  [name] Stanford Breed, MD [facility] of [facility] Phone Number: [phone] 01/17/2021 7:39 AM

## 2021-01-17 ENCOUNTER — Encounter (HOSPITAL_COMMUNITY): Payer: Self-pay | Admitting: Vascular Surgery

## 2021-01-17 LAB — SURGICAL PCR SCREEN
MRSA, PCR: NEGATIVE
Staphylococcus aureus: NEGATIVE

## 2021-01-17 MED ORDER — MUPIROCIN 2 % EX OINT
1.0000 "application " | TOPICAL_OINTMENT | Freq: Two times a day (BID) | CUTANEOUS | Status: DC
  Administered 2021-01-17 – 2021-01-20 (×6): 1 via NASAL
  Filled 2021-01-17: qty 22

## 2021-01-17 MED ORDER — HYDROCHLOROTHIAZIDE 12.5 MG PO CAPS
12.5000 mg | ORAL_CAPSULE | Freq: Every day | ORAL | Status: DC
  Administered 2021-01-17 – 2021-01-20 (×3): 12.5 mg via ORAL
  Filled 2021-01-17 (×3): qty 1

## 2021-01-17 MED ORDER — CEFAZOLIN SODIUM-DEXTROSE 1-4 GM/50ML-% IV SOLN: 1.0000 g | INTRAVENOUS | Status: AC

## 2021-01-17 NOTE — Progress Notes (Signed)
Occupational Therapy Treatment Patient Details Name: Heather [name] MRN: [id number] DOB: [birth year] Today's Date: 01/17/2021    History of present illness 80 y/o female presented to ED on 7/6 for L foot pain. Recently admitted at [facility] 6/24-6/27 for ischemic L toe and cellulitis. CTA with runoff performed without significant stenosis however bilateral ABIs poor ciruclation in L foot. Patient now s/p L LE arteriogram with stent placement on 7/7. Plan for L 4/5 toe amputation 7/13. PMH: non small cell lung cancer, vertigo, psoriasis, tobacco use, Afib, ascending aorta aneurysm   OT comments  Patient supine in bed and agreeable to OT session.  Limited to EOB due to pain and decreased activity tolerance- but pt completing grooming with setup assist and applied lotion to BUEs with setup assist.  She transfers to/from EOB with supervision, increased WOB with minimal activity on 2L [place] with SpO2 maintained at 98%.  Pt anxious for surgery tomorrow.  Will follow post op.  SNF appropriate at this time.    Follow Up Recommendations  SNF;Supervision/Assistance - 24 hour    Equipment Recommendations  None recommended by OT    Recommendations for Other Services      Precautions / Restrictions Precautions Precautions: Fall Precaution Comments: WBAT L LE in darco shoe Restrictions Weight Bearing Restrictions: Yes LLE Weight Bearing: Weight bearing as tolerated Other Position/Activity Restrictions: in darco shoe       Mobility Bed Mobility Overal bed mobility: Needs Assistance Bed Mobility: Supine to Sit;Sit to Supine     Supine to sit: Supervision Sit to supine: Supervision   General bed mobility comments: supervision for safety    Transfers                      Balance Overall balance assessment: Needs assistance Sitting-balance support: No upper extremity supported;Feet supported Sitting balance-Leahy Scale: Good                                      ADL either performed or assessed with clinical judgement   ADL Overall ADL's : Needs assistance/impaired     Grooming: Set up;Sitting Grooming Details (indicate cue type and reason): EOB for mouth wash and washing face Upper Body Bathing: Set up;Sitting Upper Body Bathing Details (indicate cue type and reason): applying lotion to BUEs with setup assist, increased time                         Functional mobility during ADLs: Supervision/safety (limited to EOB) General ADL Comments: pt limited by SOB with minimal activity, VSS on 2L [place] with SpO2 98%.     Vision       Perception     Praxis      Cognition Arousal/Alertness: Awake/alert Behavior During Therapy: Flat affect;Anxious Overall Cognitive Status: Impaired/Different from baseline Area of Impairment: Problem solving;Awareness;Memory                     Memory: Decreased short-term memory     Awareness: Emergent Problem Solving: Slow processing;Difficulty sequencing;Requires verbal cues General Comments: patient anxious for upcoming surgery, requires encouragement and increased time to complete ADL tasks        Exercises     Shoulder Instructions       General Comments      Pertinent Vitals/ Pain       Pain Assessment: Faces  Faces Pain Scale: Hurts even more Pain Location: L toes Pain Descriptors / Indicators: Guarding;Grimacing Pain Intervention(s): Limited activity within patient's tolerance;Monitored during session;Repositioned  Home Living                                          Prior Functioning/Environment              Frequency  Min 2X/week        Progress Toward Goals  OT Goals(current goals can now be found in the care plan section)  Progress towards OT goals: Not progressing toward goals - comment (pain and SOB, plan for further surgery tomorrow)  Acute Rehab OT Goals Patient Stated Goal: less pain OT Goal Formulation: With patient   Plan Discharge plan remains appropriate;Frequency remains appropriate    Co-evaluation                 AM-PAC OT "6 Clicks" Daily Activity     Outcome Measure   Help from another person eating meals?: None Help from another person taking care of personal grooming?: A Little Help from another person toileting, which includes using toliet, bedpan, or urinal?: A Little Help from another person bathing (including washing, rinsing, drying)?: A Little Help from another person to put on and taking off regular upper body clothing?: A Little Help from another person to put on and taking off regular lower body clothing?: A Little 6 Click Score: 19    End of Session Equipment Utilized During Treatment: Oxygen  OT Visit Diagnosis: Other abnormalities of gait and mobility (R26.89);Muscle weakness (generalized) (M62.81);Pain;Other symptoms and signs involving cognitive function Pain - Right/Left: Left Pain - part of body: Ankle and joints of foot   Activity Tolerance Patient tolerated treatment well   Patient Left with call bell/phone within reach;in bed;with bed alarm set   Nurse Communication Mobility status        Time: 4496-7591 OT Time Calculation (min): 23 min  Charges: OT General Charges $OT Visit: 1 Visit OT Treatments $Self Care/Home Management : 23-37 mins  [place] Pager [phone] Office [place] 01/17/2021, 10:38 AM

## 2021-01-17 NOTE — Progress Notes (Addendum)
  Progress Note    01/17/2021 7:47 AM 5 Days Post-Op  Subjective:  Was OOB yesterday without near-syncope or CP.  RN noted brief run of a.fib with RVR early this AM.  Vitals:   01/17/21 0400 01/17/21 0726  BP: (!) 151/100 (!) 144/104  Pulse: 74 88  Resp: 18 17  Temp: 98 F (36.7 C) (!) 97.4 F (36.3 C)  SpO2: 99% 96%    Physical Exam: General appearance: Awake, alert in no apparent distress Cardiac: Heart rate and rhythm are regular Respirations: Nonlabored Extremities: Both feet are warm with intact sensation and motor function.  Ischemic changes noted and unchanged. Pulse/Doppler exam: Palpable 2+ left DP pulse  CBC    Component Value Date/Time   WBC 9.1 01/13/2021 1139   RBC 3.24 (L) 01/13/2021 1139   HGB 10.1 (L) 01/13/2021 1139   HGB 12.1 12/15/2020 0716   HCT 30.3 (L) 01/13/2021 1139   PLT 152 01/13/2021 1139   PLT 159 12/15/2020 0716   MCV 93.5 01/13/2021 1139   MCH 31.2 01/13/2021 1139   MCHC 33.3 01/13/2021 1139   RDW 13.9 01/13/2021 1139   LYMPHSABS 2.1 01/13/2021 1139   MONOABS 0.6 01/13/2021 1139   EOSABS 0.0 01/13/2021 1139   BASOSABS 0.0 01/13/2021 1139    BMET    Component Value Date/Time   NA 138 01/13/2021 1139   K 3.9 01/13/2021 1139   CL 106 01/13/2021 1139   CO2 20 (L) 01/13/2021 1139   GLUCOSE 104 (H) 01/13/2021 1139   BUN 12 01/13/2021 1139   CREATININE 0.97 01/13/2021 1139   CREATININE 0.96 12/15/2020 0716   CALCIUM 8.4 (L) 01/13/2021 1139   GFRNONAA 59 (L) 01/13/2021 1139   GFRNONAA 60 (L) 12/15/2020 0716   GFRAA >60 03/17/2020 1102     Intake/Output Summary (Last 24 hours) at 01/17/2021 0747 Last data filed at 01/16/2021 1500 Gross per 24 hour  Intake 240 ml  Output --  Net 240 ml    HOSPITAL MEDICATIONS Scheduled Meds:  acetaminophen  650 mg Oral Q6H   aspirin EC  81 mg Oral Daily   atorvastatin  40 mg Oral Daily   clopidogrel  75 mg Oral Daily   heparin  5,000 Units Subcutaneous Q8H   metoprolol succinate  25 mg  Oral BID   osimertinib mesylate  80 mg Oral Daily   pantoprazole  40 mg Oral Daily   sodium chloride flush  3 mL Intravenous Q12H   Continuous Infusions:  sodium chloride     lactated ringers 125 mL/hr at 01/12/21 0207   PRN Meds:.sodium chloride, acetaminophen, alum & mag hydroxide-simeth, guaiFENesin-dextromethorphan, hydrALAZINE, labetalol, meclizine, metoprolol tartrate, ondansetron, oxyCODONE, phenol, sodium chloride flush  Assessment and Plan: Primary stent of left distal SFA/above knee popliteal artery high-grade stenosis with post balloon angioplasty. 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 Plan for left 4,5 toe amp on Wednesday. NPO after MN.  Continue with plans for SNF placement.  Brief run a. Fib. Continue BB. Asymptomatic. Restart HCTZ.   -DVT prophylaxis:  heparin [place]      Risa Grill, PA-C [facility] [phone] 01/17/2021  7:47 AM   VASCULAR STAFF ADDENDUM: I have independently interviewed and examined the patient. I agree with the above.   [name] Stanford Breed, MD [facility] Phone Number: [phone] 01/17/2021 8:49 AM

## 2021-01-17 NOTE — Progress Notes (Signed)
Pt had a brief run of afib RVR into the 150s around 0530 while up on BSC. This episode only lasted a few seconds and then patient self-converted to NSR 80s.  Will continue to monitor.

## 2021-01-18 ENCOUNTER — Encounter (HOSPITAL_COMMUNITY)
Admission: EM | Disposition: A | Discharge status: Skilled Nursing Facility | Payer: Self-pay | Source: Home / Self Care | Attending: Vascular Surgery

## 2021-01-18 ENCOUNTER — Inpatient Hospital Stay (HOSPITAL_COMMUNITY): Payer: Medicare Other | Admitting: Certified Registered"

## 2021-01-18 ENCOUNTER — Encounter (HOSPITAL_COMMUNITY): Payer: Self-pay | Admitting: Vascular Surgery

## 2021-01-18 DIAGNOSIS — I96 Gangrene, not elsewhere classified: Secondary | ICD-10-CM

## 2021-01-18 HISTORY — PX: AMPUTATION: SHX166

## 2021-01-18 LAB — CBC
HCT: 30 % — ABNORMAL LOW (ref 36.0–46.0)
Hemoglobin: 9.9 g/dL — ABNORMAL LOW (ref 12.0–15.0)
MCH: 30.6 pg (ref 26.0–34.0)
MCHC: 33 g/dL (ref 30.0–36.0)
MCV: 92.6 fL (ref 80.0–100.0)
Platelets: 182 10*3/uL (ref 150–400)
RBC: 3.24 MIL/uL — ABNORMAL LOW (ref 3.87–5.11)
RDW: 14.5 % (ref 11.5–15.5)
WBC: 8.7 10*3/uL (ref 4.0–10.5)
nRBC: 0 % (ref 0.0–0.2)

## 2021-01-18 LAB — BASIC METABOLIC PANEL
Anion gap: 13 (ref 5–15)
BUN: 9 mg/dL (ref 8–23)
CO2: 23 mmol/L (ref 22–32)
Calcium: 8.5 mg/dL — ABNORMAL LOW (ref 8.9–10.3)
Chloride: 101 mmol/L (ref 98–111)
Creatinine, Ser: 0.88 mg/dL (ref 0.44–1.00)
GFR, Estimated: >60 mL/min (ref >60)
Glucose, Bld: 96 mg/dL (ref 70–99)
Potassium: 3 mmol/L — ABNORMAL LOW (ref 3.5–5.1)
Sodium: 137 mmol/L (ref 135–145)

## 2021-01-18 LAB — MAGNESIUM: Magnesium: 1.7 mg/dL (ref 1.7–2.4)

## 2021-01-18 SURGERY — AMPUTATION DIGIT
Anesthesia: Monitor Anesthesia Care | Laterality: Left

## 2021-01-18 MED ORDER — ORAL CARE MOUTH RINSE: 15.0000 mL | Freq: Once | OROMUCOSAL | Status: AC

## 2021-01-18 MED ORDER — LIDOCAINE HCL (PF) 1 % IJ SOLN
INTRAMUSCULAR | Status: AC
  Filled 2021-01-18: qty 30

## 2021-01-18 MED ORDER — 0.9 % SODIUM CHLORIDE (POUR BTL) OPTIME
TOPICAL | Status: DC | PRN
  Administered 2021-01-18: 1000 mL

## 2021-01-18 MED ORDER — LACTATED RINGERS IV SOLN: INTRAVENOUS | Status: DC

## 2021-01-18 MED ORDER — FENTANYL CITRATE (PF) 100 MCG/2ML IJ SOLN: 100.0000 ug | Freq: Once | INTRAMUSCULAR | Status: AC

## 2021-01-18 MED ORDER — POTASSIUM CHLORIDE CRYS ER 20 MEQ PO TBCR
40.0000 meq | EXTENDED_RELEASE_TABLET | Freq: Once | ORAL | Status: AC
  Administered 2021-01-18: 40 meq via ORAL
  Filled 2021-01-18: qty 2

## 2021-01-18 MED ORDER — FENTANYL CITRATE (PF) 100 MCG/2ML IJ SOLN
INTRAMUSCULAR | Status: AC
  Administered 2021-01-18: 100 ug via INTRAVENOUS
  Filled 2021-01-18: qty 2

## 2021-01-18 MED ORDER — LIDOCAINE 2% (20 MG/ML) 5 ML SYRINGE
INTRAMUSCULAR | Status: DC | PRN
  Administered 2021-01-18: 60 mg via INTRAVENOUS

## 2021-01-18 MED ORDER — DEXMEDETOMIDINE (PRECEDEX) IN NS 20 MCG/5ML (4 MCG/ML) IV SYRINGE
PREFILLED_SYRINGE | INTRAVENOUS | Status: DC | PRN
  Administered 2021-01-18: 12 ug via INTRAVENOUS

## 2021-01-18 MED ORDER — CHLORHEXIDINE GLUCONATE 0.12 % MT SOLN
15.0000 mL | Freq: Once | OROMUCOSAL | Status: AC
  Administered 2021-01-18: 15 mL via OROMUCOSAL
  Filled 2021-01-18: qty 15

## 2021-01-18 MED ORDER — MAGNESIUM SULFATE 2 GM/50ML IV SOLN: 2.0000 g | Freq: Once | INTRAVENOUS | Status: DC

## 2021-01-18 MED ORDER — PROPOFOL 500 MG/50ML IV EMUL
INTRAVENOUS | Status: DC | PRN
  Administered 2021-01-18: 50 ug/kg/min via INTRAVENOUS

## 2021-01-18 MED ORDER — MIDAZOLAM HCL 2 MG/2ML IJ SOLN
INTRAMUSCULAR | Status: AC
  Filled 2021-01-18: qty 2

## 2021-01-18 MED ORDER — CEFAZOLIN SODIUM-DEXTROSE 2-3 GM-%(50ML) IV SOLR
INTRAVENOUS | Status: DC | PRN
  Administered 2021-01-18: 2 g via INTRAVENOUS

## 2021-01-18 MED ORDER — LIDOCAINE HCL (PF) 1 % IJ SOLN
INTRAMUSCULAR | Status: DC | PRN
  Administered 2021-01-18: 6 mL

## 2021-01-18 MED ORDER — PROPOFOL 10 MG/ML IV BOLUS
INTRAVENOUS | Status: DC | PRN
  Administered 2021-01-18: 30 mg via INTRAVENOUS

## 2021-01-18 MED ORDER — ROPIVACAINE HCL 7.5 MG/ML IJ SOLN
INTRAMUSCULAR | Status: DC | PRN
  Administered 2021-01-18: 20 mL via PERINEURAL

## 2021-01-18 MED ORDER — DRONABINOL 2.5 MG PO CAPS
2.5000 mg | ORAL_CAPSULE | Freq: Two times a day (BID) | ORAL | Status: DC
  Administered 2021-01-19 – 2021-01-20 (×3): 2.5 mg via ORAL
  Filled 2021-01-18 (×3): qty 1

## 2021-01-18 SURGICAL SUPPLY — 37 items
BAG COUNTER SPONGE SURGICOUNT (BAG) ×2 IMPLANT
BLADE AVERAGE 25X9 (BLADE) IMPLANT
BLADE SURG 21 STRL SS (BLADE) ×2 IMPLANT
BNDG CONFORM 3 STRL LF (GAUZE/BANDAGES/DRESSINGS) ×2 IMPLANT
BNDG ELASTIC 4X5.8 VLCR STR LF (GAUZE/BANDAGES/DRESSINGS) ×2 IMPLANT
BNDG ELASTIC 6X15 VLCR STRL LF (GAUZE/BANDAGES/DRESSINGS) ×2 IMPLANT
BNDG GAUZE ELAST 4 BULKY (GAUZE/BANDAGES/DRESSINGS) ×2 IMPLANT
CANISTER SUCT 3000ML PPV (MISCELLANEOUS) ×2 IMPLANT
COVER SURGICAL LIGHT HANDLE (MISCELLANEOUS) ×2 IMPLANT
DRAPE HALF SHEET 40X57 (DRAPES) ×2 IMPLANT
DRAPE ORTHO SPLIT 77X108 STRL (DRAPES) ×2
DRAPE SURG ORHT 6 SPLT 77X108 (DRAPES) ×2 IMPLANT
DRSG XEROFORM 1X8 (GAUZE/BANDAGES/DRESSINGS) ×2 IMPLANT
ELECT REM PT RETURN 9FT ADLT (ELECTROSURGICAL) ×2
ELECTRODE REM PT RTRN 9FT ADLT (ELECTROSURGICAL) ×1 IMPLANT
GAUZE SPONGE 4X4 12PLY STRL (GAUZE/BANDAGES/DRESSINGS) ×2 IMPLANT
GAUZE XEROFORM 1X8 LF (GAUZE/BANDAGES/DRESSINGS) ×2 IMPLANT
GLOVE SURG POLYISO LF SZ8 (GLOVE) ×2 IMPLANT
GOWN STRL REUS W/ TWL LRG LVL3 (GOWN DISPOSABLE) ×2 IMPLANT
GOWN STRL REUS W/ TWL XL LVL3 (GOWN DISPOSABLE) ×1 IMPLANT
GOWN STRL REUS W/TWL LRG LVL3 (GOWN DISPOSABLE) ×2
GOWN STRL REUS W/TWL XL LVL3 (GOWN DISPOSABLE) ×1
KIT BASIN OR (CUSTOM PROCEDURE TRAY) ×2 IMPLANT
KIT TURNOVER KIT B (KITS) ×2 IMPLANT
NS IRRIG 1000ML POUR BTL (IV SOLUTION) ×2 IMPLANT
PACK GENERAL/GYN (CUSTOM PROCEDURE TRAY) ×2 IMPLANT
PAD ARMBOARD 7.5X6 YLW CONV (MISCELLANEOUS) ×4 IMPLANT
STAPLER VISISTAT 35W (STAPLE) ×2 IMPLANT
SUT ETHILON 2 0 PSLX (SUTURE) ×2 IMPLANT
SUT ETHILON 3 0 PS 1 (SUTURE) ×2 IMPLANT
SUT SILK 2 0SH CR/8 30 (SUTURE) IMPLANT
SUT VIC AB 2-0 CT1 18 (SUTURE) ×2 IMPLANT
SUT VIC AB 3-0 SH 8-18 (SUTURE) ×2 IMPLANT
TOWEL GREEN STERILE (TOWEL DISPOSABLE) ×4 IMPLANT
TOWEL GREEN STERILE FF (TOWEL DISPOSABLE) ×2 IMPLANT
UNDERPAD 30X36 HEAVY ABSORB (UNDERPADS AND DIAPERS) ×2 IMPLANT
WATER STERILE IRR 1000ML POUR (IV SOLUTION) ×2 IMPLANT

## 2021-01-18 NOTE — Anesthesia Procedure Notes (Signed)
Anesthesia Regional Block: Popliteal block   Pre-Anesthetic Checklist: , timeout performed,  Correct Patient, Correct Site, Correct Laterality,  Correct Procedure, Correct Position, site marked,  Risks and benefits discussed,  Surgical consent,  Pre-op evaluation,  At surgeon's request and post-op pain management  Laterality: Left  Prep: Dura Prep       Needles:  Injection technique: Single-shot  Needle Type: Echogenic Stimulator Needle     Needle Length: 10cm  Needle Gauge: 20     Additional Needles:   Procedures:,,,, ultrasound used (permanent image in chart),,    Narrative:  Start time: 01/18/2021 4:00 PM End time: 01/18/2021 4:10 PM Injection made incrementally with aspirations every 5 mL.  Performed by: Personally  Anesthesiologist: Darral Dash, DO  Additional Notes: Patient identified. Risks/Benefits/Options discussed with patient including but not limited to bleeding, infection, nerve damage, failed block, incomplete pain control. Patient expressed understanding and wished to proceed. All questions were answered. Sterile technique was used throughout the entire procedure. Please see nursing notes for vital signs. Aspirated in 5cc intervals with injection for negative confirmation. Patient was given instructions on fall risk and not to get out of bed. All questions and concerns addressed with instructions to call with any issues or inadequate analgesia.

## 2021-01-18 NOTE — Anesthesia Procedure Notes (Signed)
Procedure Name: MAC Date/Time: 01/18/2021 4:30 PM Performed by: Georgia Duff, CRNA Pre-anesthesia Checklist: Patient identified and Emergency Drugs available Oxygen Delivery Method: Simple face mask Induction Type: IV induction Dental Injury: Teeth and Oropharynx as per pre-operative assessment

## 2021-01-18 NOTE — Op Note (Signed)
DATE OF SERVICE: 01/18/2021  PATIENT:  Heather [name]  80 y.o. female  PRE-OPERATIVE DIAGNOSIS:  dry gangrene of left fourth / fifth toes  POST-OPERATIVE DIAGNOSIS:  Same  PROCEDURE:   Left fourth and fifth toe amputation  SURGEON:  Surgeon(s) and Role:    * Cherre Robins, MD - Primary  ASSISTANT: none  ANESTHESIA:   local and general  EBL: min  BLOOD ADMINISTERED:none  DRAINS: none   LOCAL MEDICATIONS USED:  LIDOCAINE   SPECIMEN:  toes to morgue  COUNTS: confirmed correct.  TOURNIQUET:  none  PATIENT DISPOSITION:  PACU - hemodynamically stable.   Delay start of Pharmacological VTE agent (>24hrs) due to surgical blood loss or risk of bleeding: no  INDICATION FOR PROCEDURE: [name] is a 80 y.o. female with recent revascularization for critical limb ischemia.  She has dry gangrene of the left fourth and fifth toes.  After careful discussion of risks, benefits, and alternatives the patient was offered left fourth and fifth toe amputation. We specifically discussed risk of poor healing. The patient understood and wished to proceed.  OPERATIVE FINDINGS: Healthy bleeding tissue at margins of amputation.  DESCRIPTION OF PROCEDURE: After identification of the patient in the pre-operative holding area, the patient was transferred to the operating room. The patient was positioned supine on the operating room table. Anesthesia was induced. The left foot was prepped and draped in standard fashion. A surgical pause was performed confirming correct patient, procedure, and operative location.  A fishmouth incision was made around the left fourth and fifth toes.  Incision was carried down to the bone.  A bone cutter was used to excise the toes.  I used a large rongeur device to debride bone back to the level of the metatarsals.  The wound was copiously irrigated.  Hemostasis was achieved. The wound was closed in layers using 3-0 Vicryl and 3-0 nylon mattress sutures.  A sterile  bandage was applied.  Upon completion of the case instrument and sharps counts were confirmed correct. The patient was transferred to the PACU in good condition. I was present for all portions of the procedure.  [name] Stanford Breed, MD [facility] Phone Number: [phone] 01/18/2021 5:08 PM

## 2021-01-18 NOTE — Transfer of Care (Signed)
Immediate Anesthesia Transfer of Care Note  Patient: TAIMI TOWE  Procedure(s) Performed: LEFT FOURTH AND FIFTH TOE AMPUTATION (Left)  Patient Location: PACU  Anesthesia Type:MAC and Regional  Level of Consciousness: awake, oriented and patient cooperative  Airway & Oxygen Therapy: Patient Spontanous Breathing and Patient connected to nasal cannula oxygen  Post-op Assessment: Report given to RN and Post -op Vital signs reviewed and stable  Post vital signs: Reviewed and stable  Last Vitals:  Vitals Value Taken Time  BP 133/83 01/18/21 1718  Temp    Pulse    Resp 23 01/18/21 1721  SpO2    Vitals shown include unvalidated device data.  Last Pain:  Vitals:   01/18/21 1441  TempSrc:   PainSc: 10-Worst pain ever      Patients Stated Pain Goal: 2 (45/99/77 4142)  Complications: No notable events documented.

## 2021-01-18 NOTE — Anesthesia Preprocedure Evaluation (Addendum)
Anesthesia Evaluation  Patient identified by MRN, date of birth, ID band Patient awake    Reviewed: Allergy & Precautions, NPO status , Patient's Chart, lab work & pertinent test results  Airway Mallampati: II  TM Distance: >3 FB Neck ROM: Full    Dental  (+) Edentulous Upper, Edentulous Lower   Pulmonary neg pulmonary ROS, Current Smoker,    Pulmonary exam normal        Cardiovascular hypertension, Pt. on medications and Pt. on home beta blockers + Peripheral Vascular Disease and +CHF   Rhythm:Regular Rate:Normal     Neuro/Psych negative neurological ROS  negative psych ROS   GI/Hepatic negative GI ROS, Neg liver ROS,   Endo/Other  negative endocrine ROS  Renal/GU negative Renal ROS  negative genitourinary   Musculoskeletal  (+) Arthritis ,   Abdominal (+)  Abdomen: soft. Bowel sounds: normal.  Peds  Hematology negative hematology ROS (+)   Anesthesia Other Findings   Reproductive/Obstetrics                            Anesthesia Physical Anesthesia Plan  ASA: 3  Anesthesia Plan: MAC and Regional   Post-op Pain Management:    Induction: Intravenous  PONV Risk Score and Plan: 1 and Ondansetron, Dexamethasone and Treatment may vary due to age or medical condition  Airway Management Planned: Simple Face Mask, Natural Airway and Nasal Cannula  Additional Equipment: None  Intra-op Plan:   Post-operative Plan:   Informed Consent: I have reviewed the patients History and Physical, chart, labs and discussed the procedure including the risks, benefits and alternatives for the proposed anesthesia with the patient or authorized representative who has indicated his/her understanding and acceptance.     Dental advisory given  Plan Discussed with: CRNA  Anesthesia Plan Comments:         Anesthesia Quick Evaluation

## 2021-01-18 NOTE — Progress Notes (Signed)
VASC[facility] SPECIALISTS OF [place] PROGRESS NOTE  ASSESSMENT / PLAN: Heather [name] is a 80 y.o. female with dry gangrene of right fourth / fifth toes after successful endovascular revascularization. To OR for toe amputations today.   SUBJECTIVE: No interval changes. Still having occasional bloody sputum. Not eating much. Daughter is concerned.  OBJECTIVE: BP (!) 154/94   Pulse 99   Temp 97.9 F (36.6 C) (Oral)   Resp 20   Ht 5\' 7"  (1.702 m)   Wt 69.5 kg   SpO2 99%   BMI 24.01 kg/m   Intake/Output Summary (Last 24 hours) at 01/18/2021 1604 Last data filed at 01/17/2021 2117 Gross per 24 hour  Intake --  Output 300 ml  Net -300 ml    No distress RLE 2+ DP Dry gangrene of R 4/5 toes  CBC Latest Ref Rng & Units 01/18/2021 01/13/2021 01/11/2021  WBC 4.0 - 10.5 K/uL 8.7 9.1 10.7(H)  Hemoglobin 12.0 - 15.0 g/dL 9.9(L) 10.1(L) 11.1(L)  Hematocrit 36.0 - 46.0 % 30.0(L) 30.3(L) 34.1(L)  Platelets 150 - 400 K/uL 182 152 120(L)     CMP Latest Ref Rng & Units 01/18/2021 01/13/2021 01/11/2021  Glucose 70 - 99 mg/dL 96 104(H) 144(H)  BUN 8 - 23 mg/dL 9 12 7(L)  Creatinine 0.44 - 1.00 mg/dL 0.88 0.97 0.87  Sodium 135 - 145 mmol/L 137 138 138  Potassium 3.5 - 5.1 mmol/L 3.0(L) 3.9 3.6  Chloride 98 - 111 mmol/L 101 106 103  CO2 22 - 32 mmol/L 23 20(L) 23  Calcium 8.9 - 10.3 mg/dL 8.5(L) 8.4(L) 9.3  Total Protein 6.5 - 8.1 g/dL - - 6.5  Total Bilirubin 0.3 - 1.2 mg/dL - - 1.4(H)  Alkaline Phos 38 - 126 U/L - - 67  AST 15 - 41 U/L - - 38  ALT 0 - 44 U/L - - 23    Estimated Creatinine Clearance: 49.6 mL/min (by C-G formula based on SCr of 0.88 mg/dL).  [name] Stanford Breed, MD [facility] Phone Number: [phone] 01/18/2021 4:04 PM

## 2021-01-18 NOTE — Progress Notes (Signed)
Pt received from PACU. VSS. L foot drsg clean, dry and intact. Call light in reach.  Clyde Canterbury, RN

## 2021-01-19 ENCOUNTER — Encounter (HOSPITAL_COMMUNITY): Payer: Self-pay | Admitting: Vascular Surgery

## 2021-01-19 ENCOUNTER — Telehealth: Payer: Self-pay | Admitting: Medical Oncology

## 2021-01-19 LAB — BASIC METABOLIC PANEL
Anion gap: 13 (ref 5–15)
BUN: 15 mg/dL (ref 8–23)
CO2: 23 mmol/L (ref 22–32)
Calcium: 9 mg/dL (ref 8.9–10.3)
Chloride: 102 mmol/L (ref 98–111)
Creatinine, Ser: 0.95 mg/dL (ref 0.44–1.00)
GFR, Estimated: >60 mL/min (ref >60)
Glucose, Bld: 140 mg/dL — ABNORMAL HIGH (ref 70–99)
Potassium: 4.7 mmol/L (ref 3.5–5.1)
Sodium: 138 mmol/L (ref 135–145)

## 2021-01-19 NOTE — Care Management Important Message (Signed)
Important Message  Patient Details  Name: Heather [name] MRN: [id number] Date of Birth: 1940-12-02   Medicare Important Message Given:  Yes     Shelda Altes 01/19/2021, 11:25 AM

## 2021-01-19 NOTE — Progress Notes (Signed)
Occupational Therapy Treatment Patient Details Name: Heather [name] MRN: [id number] DOB: Jun 11, 1941 Today's Date: 01/19/2021    History of present illness 80 y/o female presented to ED on 7/6 for L foot pain. Recently admitted at [facility] 6/24-6/27 for ischemic L toe and cellulitis. CTA with runoff performed without significant stenosis however bilateral ABIs poor ciruclation in L foot. Patient now s/p L LE arteriogram with stent placement on 7/7. Underwent lt 4/5 toe amputation 7/13. PMH: non small cell lung cancer, vertigo, psoriasis, tobacco use, Afib, ascending aorta aneurysm   OT comments  Patient progressing towards goals, reports pain has improved since surgery yesterday.  She requires min assist for toilet transfers, total assist for toileting and max assist for LB ADLs.  SpO2 89-92% on 2L. Limited today due to NWB, but will be able to be WBAT tomorrow.  Will follow acutely. SNF remains appropriate.    Follow Up Recommendations  SNF;Supervision/Assistance - 24 hour    Equipment Recommendations  None recommended by OT    Recommendations for Other Services      Precautions / Restrictions Precautions Precautions: Fall Required Braces or Orthoses: Other Brace Other Brace: Post op shoe on LLE Restrictions Weight Bearing Restrictions: Yes LLE Weight Bearing: Non weight bearing Other Position/Activity Restrictions: Pt to be NWB on 7/14 and then beginning 7/15 can be WBAT       Mobility Bed Mobility Overal bed mobility: Needs Assistance Bed Mobility: Supine to Sit     Supine to sit: Supervision     General bed mobility comments: increased time, no physical assist required    Transfers Overall transfer level: Needs assistance Equipment used: Rolling walker (2 wheeled) Transfers: Sit to/from Stand Sit to Stand: Min assist         General transfer comment: Assist to bring hips up and for balance. Verbal/tactile cues for hand placement and to try to  maintain NWB LLE.    Balance Overall balance assessment: Needs assistance Sitting-balance support: No upper extremity supported;Feet supported Sitting balance-Leahy Scale: Good     Standing balance support: Bilateral upper extremity supported;During functional activity Standing balance-Leahy Scale: Poor Standing balance comment: walker and min assist for static standing                           ADL either performed or assessed with clinical judgement   ADL Overall ADL's : Needs assistance/impaired                     Lower Body Dressing: Maximal assistance;Sit to/from stand Lower Body Dressing Details (indicate cue type and reason): assist to don L shoe, min assist to stand but requires BUE support Toilet Transfer: Minimal assistance;Stand-pivot;BSC;RW   Toileting- Clothing Manipulation and Hygiene: Total assistance;Sit to/from stand Toileting - Clothing Manipulation Details (indicate cue type and reason): for hygiene after BM, clothing mgmt     Functional mobility during ADLs: Minimal assistance;Rolling walker General ADL Comments: pt limited by SOB with minimal activity, VSS on 2L [place] with SpO2 98%.     Vision       Perception     Praxis      Cognition Arousal/Alertness: Awake/alert Behavior During Therapy: WFL for tasks assessed/performed Overall Cognitive Status: Impaired/Different from baseline Area of Impairment: Memory;Safety/judgement;Problem solving                     Memory: Decreased short-term memory   Safety/Judgement: Decreased awareness of safety  Problem Solving: Slow processing;Requires verbal cues General Comments: pt requires cueing for safety, decreased recall        Exercises     Shoulder Instructions       General Comments 2L [place] with SpO2 89-92%    Pertinent Vitals/ Pain       Pain Assessment: No/denies pain  Home Living                                          Prior  Functioning/Environment              Frequency  Min 2X/week        Progress Toward Goals  OT Goals(current goals can now be found in the care plan section)  Progress towards OT goals: Progressing toward goals  Acute Rehab OT Goals Patient Stated Goal: home OT Goal Formulation: With patient  Plan Frequency remains appropriate;Discharge plan remains appropriate    Co-evaluation                 AM-PAC OT "6 Clicks" Daily Activity     Outcome Measure   Help from another person eating meals?: None Help from another person taking care of personal grooming?: A Little Help from another person toileting, which includes using toliet, bedpan, or urinal?: A Lot Help from another person bathing (including washing, rinsing, drying)?: A Little Help from another person to put on and taking off regular upper body clothing?: A Little Help from another person to put on and taking off regular lower body clothing?: A Lot 6 Click Score: 17    End of Session Equipment Utilized During Treatment: Rolling walker;Oxygen  OT Visit Diagnosis: Other abnormalities of gait and mobility (R26.89);Muscle weakness (generalized) (M62.81);Pain;Other symptoms and signs involving cognitive function Pain - Right/Left: Left Pain - part of body: Ankle and joints of foot   Activity Tolerance Patient tolerated treatment well   Patient Left in chair;with call bell/phone within reach (with PT)   Nurse Communication Mobility status        Time: 2500-3704 OT Time Calculation (min): 19 min  Charges: OT General Charges $OT Visit: 1 Visit OT Treatments $Self Care/Home Management : 8-22 mins  Jolaine Artist, [place] Pager [phone] Office [place] 01/19/2021, 11:17 AM

## 2021-01-19 NOTE — TOC Progression Note (Signed)
Transition of Care [facility]) - Progression Note    Patient Details  Name: Heather [name] MRN: [id number] Date of Birth: Sep 25, 1940  Transition of Care [facility]) CM/SW [place],  Phone Number: 01/19/2021, 11:22 AM  Clinical Narrative:     CSW met with patient and patient's daughter, [name]. CSW provided bed offers. Family requesting bed offers in [place] area. CSW sent clinicals for bed offers in [place] and will wait for response.   CSW will follow up with offers later today.  Thurmond Butts, MSW, LCSW Clinical Social Worker    Expected Discharge Plan: Skilled Nursing Facility Barriers to Discharge: SNF Pending bed offer  Expected Discharge Plan and Services Expected Discharge Plan: [place] In-house Referral: Clinical Social Work Discharge Planning Services: CM Consult Post Acute Care Choice: [place] arrangements for the past 2 months: Single Family Home Expected Discharge Date: 01/14/21                                     Social Determinants of Health (SDOH) Interventions    Readmission Risk Interventions No flowsheet data found.

## 2021-01-19 NOTE — TOC Progression Note (Signed)
Transition of Care [facility]) - Progression Note    Patient Details  Name: Heather [name] MRN: [id number] Date of Birth: 03-Jul-1941  Transition of Care [facility]) CM/SW [place], [place] Phone Number: 01/19/2021, 1:57 PM  Clinical Narrative:     CSW sent patient's daughter names of facilities in [place] that has offered. CSW will follow up for SNF choice.  Thurmond Butts, MSW, LCSW Clinical Social Worker    Expected Discharge Plan: Skilled Nursing Facility Barriers to Discharge: SNF Pending bed offer  Expected Discharge Plan and Services Expected Discharge Plan: [place] In-house Referral: Clinical Social Work Discharge Planning Services: CM Consult Post Acute Care Choice: [place] arrangements for the past 2 months: Single Family Home Expected Discharge Date: 01/14/21                                     Social Determinants of Health (SDOH) Interventions    Readmission Risk Interventions No flowsheet data found.

## 2021-01-19 NOTE — Anesthesia Postprocedure Evaluation (Signed)
Anesthesia Post Note  Patient: [name]  Procedure(s) Performed: LEFT FOURTH AND FIFTH TOE AMPUTATION (Left)     Patient location during evaluation: PACU Anesthesia Type: Regional and MAC Level of consciousness: awake and alert Pain management: pain level controlled Vital Signs Assessment: post-procedure vital signs reviewed and stable Respiratory status: spontaneous breathing, nonlabored ventilation, respiratory function stable and patient connected to nasal cannula oxygen Cardiovascular status: stable and blood pressure returned to baseline Postop Assessment: no apparent nausea or vomiting Anesthetic complications: no   No notable events documented.  Last Vitals:  Vitals:   01/19/21 1545 01/19/21 1946  BP: 119/78 134/83  Pulse: 92 77  Resp: 18 17  Temp: 37.4 C 36.6 C  SpO2: 94% 90%    Last Pain:  Vitals:   01/19/21 1946  TempSrc: Oral  PainSc:                  March Rummage Darsi Tien

## 2021-01-19 NOTE — Telephone Encounter (Signed)
Discharge plan-Dtr stated pt may be discharged today or tomorrow to a rehab facility after her vascular surgery -(2 toes amputated.) Dtr is leaving tomorrow to go back to [place].   Hemoptysis-She said that today she told the surgeon , the  nurses on the inpt unit and  to me that Anetha is still coughing up dark blood -" and having "breathing problems" . About 2/3 of sputum is dark".   I heard Mallisa in the background say that she is "off my oxygen now, I don't  need it."  Status-Dtr  wants to talk to Dr Julien Nordmann about her mother's prognosis ( she is not fully informed of what the follow-up  plan is for her cancer). Last appt for oncology was 06/09 and to f/u in two weeks. (She was in the hospital at that time)  I told her to call me back with the name of the rehab facility and I will request a schedule message for f/u next week   We can call her when [facility] or Cassie is in the  exam room with the pt. She was satisfied with this plan . \Daughter will call me back

## 2021-01-19 NOTE — TOC Progression Note (Signed)
Transition of Care [facility]) - Progression Note    Patient Details  Name: KAESHA KIRSCH MRN: [id number] Date of Birth: [birth year]  Transition of Care [facility]) CM/SW [place], [place] Phone Number: 01/19/2021, 4:17 PM  Clinical Narrative:     CSW informed patient's daughter, [facility] has made bed offer. She has accepted the offer.  CSW will re-start insurance authorization RN updated and covid test requested   CSW will continue to follow and assist with discharge planning.  Thurmond Butts, MSW, LCSW Clinical Social Worker    Expected Discharge Plan: Skilled Nursing Facility Barriers to Discharge: SNF Pending bed offer  Expected Discharge Plan and Services Expected Discharge Plan: [place] In-house Referral: Clinical Social Work Discharge Planning Services: CM Consult Post Acute Care Choice: [place] arrangements for the past 2 months: Single Family Home Expected Discharge Date: 01/14/21                                     Social Determinants of Health (SDOH) Interventions    Readmission Risk Interventions No flowsheet data found.

## 2021-01-19 NOTE — Progress Notes (Signed)
Physical Therapy Treatment Patient Details Name: Heather [name] MRN: [id number] DOB: December 11, 1940 Today's Date: 01/19/2021    History of Present Illness 80 y/o female presented to ED on 7/6 for L foot pain. Recently admitted at [facility] 6/24-6/27 for ischemic L toe and cellulitis. CTA with runoff performed without significant stenosis however bilateral ABIs poor ciruclation in L foot. Patient now s/p L LE arteriogram with stent placement on 7/7. Underwent lt 4/5 toe amputation 7/13. PMH: non small cell lung cancer, vertigo, psoriasis, tobacco use, Afib, ascending aorta aneurysm    PT Comments    Pt now s/p lt 4th and 5th toe amputations. Per MD pt to be NWB on LLE today and then can begin WBAT tomorrow. Due to limited weight bearing today only took a few steps to chair. Once WBAT tomorrow she should start progressing nicely with mobility. Current goals remain appropriate. Continue to recommend SNF for further rehab before pt can return home.    Follow Up Recommendations  SNF     Equipment Recommendations  None recommended by PT    Recommendations for Other Services       Precautions / Restrictions Precautions Precautions: Fall Required Braces or Orthoses: Other Brace Other Brace: Post op shoe on LLE Restrictions Weight Bearing Restrictions: Yes LLE Weight Bearing: Non weight bearing Other Position/Activity Restrictions: Pt to be NWB on 7/14 and then beginning 7/15 can be WBAT    Mobility  Bed Mobility               General bed mobility comments: Pt up on [facility] with OT    Transfers Overall transfer level: Needs assistance Equipment used: Rolling walker (2 wheeled) Transfers: Sit to/from Stand Sit to Stand: Min assist         General transfer comment: Assist to bring hips up and for balance. Verbal/tactile cues for hand placement and to try to maintain NWB LLE.  Ambulation/Gait Ambulation/Gait assistance: Min assist Gait Distance (Feet): 2  Feet Assistive device: Rolling walker (2 wheeled) Gait Pattern/deviations: Step-to pattern;Decreased step length - right;Decreased step length - left Gait velocity: decr Gait velocity interpretation: <1.31 ft/sec, indicative of household ambulator General Gait Details: Assist for balance and support. Pt unable to maintain NWB but was placing weight on heel. Limited distance due to wt bearing   Stairs             Wheelchair Mobility    Modified Rankin (Stroke Patients Only)       Balance Overall balance assessment: Needs assistance Sitting-balance support: No upper extremity supported;Feet supported Sitting balance-Leahy Scale: Good     Standing balance support: Bilateral upper extremity supported;During functional activity Standing balance-Leahy Scale: Poor Standing balance comment: walker and min assist for static standing                            Cognition Arousal/Alertness: Awake/alert Behavior During Therapy: WFL for tasks assessed/performed Overall Cognitive Status: Impaired/Different from baseline Area of Impairment: Memory                     Memory: Decreased short-term memory                Exercises      General Comments General comments (skin integrity, edema, etc.): Pt on 2L O2 with SpO2 89-91%      Pertinent Vitals/Pain Pain Assessment: No/denies pain    Home Living  Prior Function            PT Goals (current goals can now be found in the care plan section) Acute Rehab PT Goals Patient Stated Goal: not stated Progress towards PT goals: Progressing toward goals    Frequency    Min 3X/week      PT Plan Current plan remains appropriate    Co-evaluation              AM-PAC PT "6 Clicks" Mobility   Outcome Measure  Help needed turning from your back to your side while in a flat bed without using bedrails?: A Little Help needed moving from lying on your back to sitting  on the side of a flat bed without using bedrails?: A Little Help needed moving to and from a bed to a chair (including a wheelchair)?: A Little Help needed standing up from a chair using your arms (e.g., wheelchair or bedside chair)?: A Little Help needed to walk in hospital room?: A Little Help needed climbing 3-5 steps with a railing? : A Little 6 Click Score: 18    End of Session Equipment Utilized During Treatment: Oxygen Activity Tolerance: Patient tolerated treatment well Patient left: in chair;with call bell/phone within reach;with chair alarm set   PT Visit Diagnosis: Unsteadiness on feet (R26.81);Other abnormalities of gait and mobility (R26.89);Muscle weakness (generalized) (M62.81)     Time: 4782-9562 PT Time Calculation (min) (ACUTE ONLY): 19 min  Charges:  $Therapeutic Activity: 8-22 mins                     [place] Pager [phone] Office [place] 01/19/2021, 11:03 AM

## 2021-01-19 NOTE — Progress Notes (Signed)
VASCULAR AND VEIN SPECIALISTS OF [place] PROGRESS NOTE  ASSESSMENT / PLAN: Heather [name] is a 80 y.o. female status post L SFA stent (01/12/21) and 4/5 L toe amp (01/18/21) for atherosclerosis of native arteries causing gangrene. She has had occasional small-volume blood streaking sputum production and has been difficult to wean from oxygen.  She is not eating much.  She is not mobilizing well.  Overall she has failure to thrive.  We will ask her pulmonologist to evaluate her at the family's request.  Continue aspirin, Plavix, statin.  Nonweightbearing to left lower extremity today.  Okay to weight-bear as tolerated tomorrow.  Continue working with therapy.  Continue working on disposition.  SUBJECTIVE: No complaints.  Foot does not hurt.  OBJECTIVE: BP 127/84 (BP Location: Left Arm)   Pulse 83   Temp 98.7 F (37.1 C) (Oral)   Resp 18   Ht 5\' 7"  (1.702 m)   Wt 69.5 kg   SpO2 99%   BMI 24.01 kg/m   Intake/Output Summary (Last 24 hours) at 01/19/2021 0818 Last data filed at 01/18/2021 1707 Gross per 24 hour  Intake 900 ml  Output --  Net 900 ml    Constitutional: well appearing. no acute distress. Cardiac: RRR. Pulmonary: unlabored Abdomen: soft Vascular: L foot bandaged. Clean and dry.  CBC Latest Ref Rng & Units 01/18/2021 01/13/2021 01/11/2021  WBC 4.0 - 10.5 K/uL 8.7 9.1 10.7(H)  Hemoglobin 12.0 - 15.0 g/dL 9.9(L) 10.1(L) 11.1(L)  Hematocrit 36.0 - 46.0 % 30.0(L) 30.3(L) 34.1(L)  Platelets 150 - 400 K/uL 182 152 120(L)     CMP Latest Ref Rng & Units 01/19/2021 01/18/2021 01/13/2021  Glucose 70 - 99 mg/dL 140(H) 96 104(H)  BUN 8 - 23 mg/dL 15 9 12   Creatinine 0.44 - 1.00 mg/dL 0.95 0.88 0.97  Sodium 135 - 145 mmol/L 138 137 138  Potassium 3.5 - 5.1 mmol/L 4.7 3.0(L) 3.9  Chloride 98 - 111 mmol/L 102 101 106  CO2 22 - 32 mmol/L 23 23 20(L)  Calcium 8.9 - 10.3 mg/dL 9.0 8.5(L) 8.4(L)  Total Protein 6.5 - 8.1 g/dL - - -  Total Bilirubin 0.3 - 1.2 mg/dL - - -  Alkaline Phos 38  - 126 U/L - - -  AST 15 - 41 U/L - - -  ALT 0 - 44 U/L - - -    Estimated Creatinine Clearance: 45.9 mL/min (by C-G formula based on SCr of 0.95 mg/dL).  Heather [name]. Stanford Breed, MD [facility] Phone Number: [phone] 01/19/2021 8:18 AM

## 2021-01-19 NOTE — Progress Notes (Signed)
   01/17/21 1005  Clinical Encounter Type  Visited With Patient  Visit Type Follow-up  Referral From Patient  Consult/Referral To Chaplain  Spiritual Encounters  Spiritual Needs Emotional;Prayer  Stress Factors  Patient Stress Factors Major life changes  Chaplain follow-up with Heather [name].  She informed me they will have to amputate her 4th and 5th toes on her left foot.  She stated rather loose 2 toes than her life.  HeatherSkolnick may be 79 but she is feisty and It is always a pleasure talking with her.   Chaplain Brandice Busser [place]  [phone]

## 2021-01-20 ENCOUNTER — Ambulatory Visit: Payer: Medicare Other | Admitting: Physician Assistant

## 2021-01-20 ENCOUNTER — Other Ambulatory Visit: Payer: Medicare Other

## 2021-01-20 DIAGNOSIS — L89151 Pressure ulcer of sacral region, stage 1: Secondary | ICD-10-CM | POA: Diagnosis not present

## 2021-01-20 DIAGNOSIS — E440 Moderate protein-calorie malnutrition: Secondary | ICD-10-CM | POA: Diagnosis not present

## 2021-01-20 DIAGNOSIS — C3412 Malignant neoplasm of upper lobe, left bronchus or lung: Secondary | ICD-10-CM | POA: Diagnosis not present

## 2021-01-20 DIAGNOSIS — I712 Thoracic aortic aneurysm, without rupture: Secondary | ICD-10-CM | POA: Diagnosis not present

## 2021-01-20 DIAGNOSIS — S99822A Other specified injuries of left foot, initial encounter: Secondary | ICD-10-CM | POA: Diagnosis not present

## 2021-01-20 DIAGNOSIS — I248 Other forms of acute ischemic heart disease: Secondary | ICD-10-CM | POA: Diagnosis not present

## 2021-01-20 DIAGNOSIS — I70262 Atherosclerosis of native arteries of extremities with gangrene, left leg: Secondary | ICD-10-CM | POA: Diagnosis not present

## 2021-01-20 DIAGNOSIS — R1084 Generalized abdominal pain: Secondary | ICD-10-CM | POA: Diagnosis not present

## 2021-01-20 DIAGNOSIS — Z743 Need for continuous supervision: Secondary | ICD-10-CM | POA: Diagnosis not present

## 2021-01-20 DIAGNOSIS — Z7189 Other specified counseling: Secondary | ICD-10-CM | POA: Diagnosis not present

## 2021-01-20 DIAGNOSIS — I10 Essential (primary) hypertension: Secondary | ICD-10-CM | POA: Diagnosis not present

## 2021-01-20 DIAGNOSIS — I714 Abdominal aortic aneurysm, without rupture: Secondary | ICD-10-CM | POA: Diagnosis not present

## 2021-01-20 DIAGNOSIS — J90 Pleural effusion, not elsewhere classified: Secondary | ICD-10-CM | POA: Diagnosis not present

## 2021-01-20 DIAGNOSIS — R0602 Shortness of breath: Secondary | ICD-10-CM | POA: Diagnosis not present

## 2021-01-20 DIAGNOSIS — Z9981 Dependence on supplemental oxygen: Secondary | ICD-10-CM | POA: Diagnosis not present

## 2021-01-20 DIAGNOSIS — I4891 Unspecified atrial fibrillation: Secondary | ICD-10-CM | POA: Diagnosis not present

## 2021-01-20 DIAGNOSIS — J440 Chronic obstructive pulmonary disease with acute lower respiratory infection: Secondary | ICD-10-CM | POA: Diagnosis not present

## 2021-01-20 DIAGNOSIS — E46 Unspecified protein-calorie malnutrition: Secondary | ICD-10-CM | POA: Diagnosis not present

## 2021-01-20 DIAGNOSIS — Z4781 Encounter for orthopedic aftercare following surgical amputation: Secondary | ICD-10-CM | POA: Diagnosis not present

## 2021-01-20 DIAGNOSIS — D649 Anemia, unspecified: Secondary | ICD-10-CM | POA: Diagnosis not present

## 2021-01-20 DIAGNOSIS — Z89422 Acquired absence of other left toe(s): Secondary | ICD-10-CM | POA: Diagnosis not present

## 2021-01-20 DIAGNOSIS — Z20822 Contact with and (suspected) exposure to covid-19: Secondary | ICD-10-CM | POA: Diagnosis not present

## 2021-01-20 DIAGNOSIS — I509 Heart failure, unspecified: Secondary | ICD-10-CM | POA: Diagnosis not present

## 2021-01-20 DIAGNOSIS — I5021 Acute systolic (congestive) heart failure: Secondary | ICD-10-CM | POA: Diagnosis not present

## 2021-01-20 DIAGNOSIS — Z515 Encounter for palliative care: Secondary | ICD-10-CM | POA: Diagnosis not present

## 2021-01-20 DIAGNOSIS — Z66 Do not resuscitate: Secondary | ICD-10-CM | POA: Diagnosis not present

## 2021-01-20 DIAGNOSIS — E1152 Type 2 diabetes mellitus with diabetic peripheral angiopathy with gangrene: Secondary | ICD-10-CM | POA: Diagnosis not present

## 2021-01-20 DIAGNOSIS — M79605 Pain in left leg: Secondary | ICD-10-CM | POA: Diagnosis not present

## 2021-01-20 DIAGNOSIS — E876 Hypokalemia: Secondary | ICD-10-CM | POA: Diagnosis not present

## 2021-01-20 DIAGNOSIS — J9601 Acute respiratory failure with hypoxia: Secondary | ICD-10-CM | POA: Diagnosis not present

## 2021-01-20 DIAGNOSIS — G9341 Metabolic encephalopathy: Secondary | ICD-10-CM | POA: Diagnosis not present

## 2021-01-20 DIAGNOSIS — M6281 Muscle weakness (generalized): Secondary | ICD-10-CM | POA: Diagnosis not present

## 2021-01-20 DIAGNOSIS — I96 Gangrene, not elsewhere classified: Secondary | ICD-10-CM | POA: Diagnosis not present

## 2021-01-20 DIAGNOSIS — I480 Paroxysmal atrial fibrillation: Secondary | ICD-10-CM | POA: Diagnosis not present

## 2021-01-20 DIAGNOSIS — I313 Pericardial effusion (noninflammatory): Secondary | ICD-10-CM | POA: Diagnosis not present

## 2021-01-20 DIAGNOSIS — E870 Hyperosmolality and hypernatremia: Secondary | ICD-10-CM | POA: Diagnosis not present

## 2021-01-20 DIAGNOSIS — I110 Hypertensive heart disease with heart failure: Secondary | ICD-10-CM | POA: Diagnosis not present

## 2021-01-20 DIAGNOSIS — D65 Disseminated intravascular coagulation [defibrination syndrome]: Secondary | ICD-10-CM | POA: Diagnosis not present

## 2021-01-20 DIAGNOSIS — J449 Chronic obstructive pulmonary disease, unspecified: Secondary | ICD-10-CM | POA: Diagnosis not present

## 2021-01-20 DIAGNOSIS — I2510 Atherosclerotic heart disease of native coronary artery without angina pectoris: Secondary | ICD-10-CM | POA: Diagnosis not present

## 2021-01-20 DIAGNOSIS — I4892 Unspecified atrial flutter: Secondary | ICD-10-CM | POA: Diagnosis not present

## 2021-01-20 DIAGNOSIS — J9621 Acute and chronic respiratory failure with hypoxia: Secondary | ICD-10-CM | POA: Diagnosis not present

## 2021-01-20 DIAGNOSIS — I517 Cardiomegaly: Secondary | ICD-10-CM | POA: Diagnosis not present

## 2021-01-20 DIAGNOSIS — Z85118 Personal history of other malignant neoplasm of bronchus and lung: Secondary | ICD-10-CM | POA: Diagnosis not present

## 2021-01-20 DIAGNOSIS — C3491 Malignant neoplasm of unspecified part of right bronchus or lung: Secondary | ICD-10-CM | POA: Diagnosis not present

## 2021-01-20 DIAGNOSIS — I5042 Chronic combined systolic (congestive) and diastolic (congestive) heart failure: Secondary | ICD-10-CM | POA: Diagnosis not present

## 2021-01-20 DIAGNOSIS — C3492 Malignant neoplasm of unspecified part of left bronchus or lung: Secondary | ICD-10-CM | POA: Diagnosis not present

## 2021-01-20 DIAGNOSIS — I739 Peripheral vascular disease, unspecified: Secondary | ICD-10-CM | POA: Diagnosis not present

## 2021-01-20 DIAGNOSIS — C3490 Malignant neoplasm of unspecified part of unspecified bronchus or lung: Secondary | ICD-10-CM | POA: Diagnosis not present

## 2021-01-20 DIAGNOSIS — I82452 Acute embolism and thrombosis of left peroneal vein: Secondary | ICD-10-CM | POA: Diagnosis not present

## 2021-01-20 DIAGNOSIS — R0689 Other abnormalities of breathing: Secondary | ICD-10-CM | POA: Diagnosis not present

## 2021-01-20 DIAGNOSIS — I5023 Acute on chronic systolic (congestive) heart failure: Secondary | ICD-10-CM | POA: Diagnosis not present

## 2021-01-20 DIAGNOSIS — R5381 Other malaise: Secondary | ICD-10-CM | POA: Diagnosis not present

## 2021-01-20 DIAGNOSIS — I4819 Other persistent atrial fibrillation: Secondary | ICD-10-CM | POA: Diagnosis not present

## 2021-01-20 DIAGNOSIS — Z7901 Long term (current) use of anticoagulants: Secondary | ICD-10-CM | POA: Diagnosis not present

## 2021-01-20 DIAGNOSIS — J189 Pneumonia, unspecified organism: Secondary | ICD-10-CM | POA: Diagnosis present

## 2021-01-20 LAB — SARS CORONAVIRUS 2 (TAT 6-24 HRS): SARS Coronavirus 2: NEGATIVE

## 2021-01-20 MED ORDER — HYDROCODONE-ACETAMINOPHEN 5-325 MG PO TABS: 1.0000 | ORAL_TABLET | Freq: Four times a day (QID) | ORAL | 0 refills | Status: AC | PRN

## 2021-01-20 MED ORDER — CLOPIDOGREL BISULFATE 75 MG PO TABS: 75.0000 mg | ORAL_TABLET | Freq: Every day | ORAL | 6 refills | Status: AC

## 2021-01-20 MED ORDER — HYDROCODONE-ACETAMINOPHEN 5-325 MG PO TABS: 1.0000 | ORAL_TABLET | Freq: Four times a day (QID) | ORAL | 0 refills | Status: DC | PRN

## 2021-01-20 NOTE — TOC Transition Note (Addendum)
Transition of Care [facility]) - CM/SW Discharge Note   Patient Details  Name: Heather [name] MRN: [id number] Date of Birth: Jul 09, 1941  Transition of Care [facility]) CM/SW Contact:  Vinie Sill, LCSW Phone Number: 01/20/2021, 1:55 PM   Clinical Narrative:     Patient will Discharge to: [place] Discharge Date: 01/20/2021 Family Notified: daughter, Heather [name]  Transport By: Heather [name]  Please call daughter, Heather [name] [phone] when picked up by Heather [name] Per MD patient is ready for discharge. RN, patient, and facility notified of discharge. Discharge Summary sent to facility. RN given number for [phone], room 124. Ambulance transport requested for patient.   Clinical Social Worker signing off.  Thurmond Butts, MSW, LCSW Clinical Social Worker     Final next level of care: Skilled Nursing Facility Barriers to Discharge: Barriers Resolved   Patient Goals and CMS Choice Patient states their goals for this hospitalization and ongoing recovery are:: return home after rehab stay CMS Medicare.gov Compare Post Acute Care list provided to:: Patient Represenative (must comment) (daughter) Choice offered to / list presented to : Adult Children  Discharge Placement              Patient [facility] bed at: [facility] Patient to be transferred to facility by: [place] Name of family member notified: daughter,Heather [name] Patient and family notified of of transfer: 01/20/21  Discharge Plan and Services In-house Referral: Clinical Social Work Discharge Planning Services: CM Consult Post Acute Care Choice: [place]                               Social Determinants of Health (SDOH) Interventions     Readmission Risk Interventions No flowsheet data found.

## 2021-01-20 NOTE — Progress Notes (Signed)
Pt being D/C, VSS, IV removed, education provided to pt and daughter, telebox returned.   Chrisandra Carota, RN 01/20/2021 7:23 PM

## 2021-01-20 NOTE — Progress Notes (Addendum)
  Progress Note    01/20/2021 10:03 AM 2 Days Post-Op  Subjective:  no complaints; says her dressing on her foot hasn't been changed.  Afebrile HR 60's-90's NSR 353'I-144'R systolic 15% RA  Vitals:   01/20/21 0546 01/20/21 0745  BP: 139/87 121/89  Pulse: 82 88  Resp: 18 18  Temp: 97.9 F (36.6 C) 98.7 F (37.1 C)  SpO2: 92% 91%    Physical Exam: Cardiac:  regular Lungs:  non labored Incisions:     Extremities:  palpable left DP pulse  CBC    Component Value Date/Time   WBC 8.7 01/18/2021 0146   RBC 3.24 (L) 01/18/2021 0146   HGB 9.9 (L) 01/18/2021 0146   HGB 12.1 12/15/2020 0716   HCT 30.0 (L) 01/18/2021 0146   PLT 182 01/18/2021 0146   PLT 159 12/15/2020 0716   MCV 92.6 01/18/2021 0146   MCH 30.6 01/18/2021 0146   MCHC 33.0 01/18/2021 0146   RDW 14.5 01/18/2021 0146   LYMPHSABS 2.1 01/13/2021 1139   MONOABS 0.6 01/13/2021 1139   EOSABS 0.0 01/13/2021 1139   BASOSABS 0.0 01/13/2021 1139    BMET    Component Value Date/Time   NA 138 01/19/2021 0153   K 4.7 01/19/2021 0153   CL 102 01/19/2021 0153   CO2 23 01/19/2021 0153   GLUCOSE 140 (H) 01/19/2021 0153   BUN 15 01/19/2021 0153   CREATININE 0.95 01/19/2021 0153   CREATININE 0.96 12/15/2020 0716   CALCIUM 9.0 01/19/2021 0153   GFRNONAA >60 01/19/2021 0153   GFRNONAA 60 (L) 12/15/2020 0716   GFRAA >60 03/17/2020 1102    INR    Component Value Date/Time   INR 1.4 (H) 01/11/2021 1140     Intake/Output Summary (Last 24 hours) at 01/20/2021 1003 Last data filed at 01/19/2021 2200 Gross per 24 hour  Intake 240 ml  Output --  Net 240 ml     Assessment/Plan:  80 y.o. female is s/p:  L SFA stent (01/12/21) and 4/5 L toe amp (01/18/21) for atherosclerosis of native arteries causing gangrene.   2 Days Post-Op   -pt with palpable left DP pulse and toe amp site looks good -dry dressing daily. -continue asa/plavix/statin -DVT prophylaxis:  sq heparin-given bloody sputum, will dc sq heparin per  Dr. Stanford Breed but need to continue asa/plavix. -for SNF at discharge  Leontine Locket, PA-C [facility] [phone] 01/20/2021 10:03 AM   VASCULAR STAFF ADDENDUM: I have independently interviewed and examined the patient. I agree with the above.  Ready for next level of care.  [name] Stanford Breed, MD [facility] Phone Number: [phone] 01/20/2021 10:37 AM

## 2021-01-20 NOTE — Discharge Summary (Signed)
[facility] Discharge Summary   Patient ID:  REE ALCALDE MRN: [id number] DOB/AGE: 81-Sep-1942 81 y.o.  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)    a. EF 30-35% by echo in 2013 --> similar results by repeat imaging in 2016 and 11/2017.   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 ([place]) 09/2011   Stage IB- s/p right upper lobectomy   Pneumonia    Psoriasis    Ankles   Tobacco abuse     Procedures:  01/12/21 L SFA stent  01/18/2021 LEFT FOURTH AND FIFTH TOE AMPUTATION  Discharged Condition: good  HPI: Heather [name] is a 80 y.o. female who presents to [facility] emergency room for evaluation of left fourth and fifth toe discoloration.  She reports this is been present for more than 4 weeks.  The left foot is quite painful to her.  She was seen in an outside facility and referred to [facility] vascular surgery, but did not want to wait for her appointment.  She is a former smoker.  She has history of lung cancer.   Hospital Course:  Admitted to expedite CLI workup. Underwent angiogram with my partner, Dr. Carlis Abbott 01/12/21 with successful  endovascular treatment of SFA. We planned to send her home after this, but she was profoundly deconditioned. PT/OT evaluation recommended SNF placement. She desired toe amputation prior to discharge. This was done by myself 01/18/21. She remained stable for discharge and was transferred to [facility] 01/20/21.  Consults:  Treatment Team:  Cherre Robins, MD  Significant Diagnostic Studies: CBC    Component Value Date/Time   WBC 8.7 01/18/2021 0146   RBC 3.24 (L) 01/18/2021 0146   HGB 9.9 (L) 01/18/2021 0146   HGB 12.1 12/15/2020 0716   HCT 30.0 (L) 01/18/2021 0146   PLT 182 01/18/2021 0146   PLT 159 12/15/2020 0716   MCV 92.6 01/18/2021 0146   MCH 30.6 01/18/2021 0146   MCHC 33.0 01/18/2021 0146   RDW 14.5 01/18/2021 0146   LYMPHSABS 2.1 01/13/2021 1139   MONOABS 0.6 01/13/2021 1139   EOSABS 0.0 01/13/2021 1139   BASOSABS 0.0 01/13/2021 1139    BMET    Component Value Date/Time   NA 138 01/19/2021 0153   K 4.7 01/19/2021 0153   CL 102 01/19/2021 0153   CO2 23 01/19/2021 0153   GLUCOSE 140 (H) 01/19/2021 0153   BUN 15 01/19/2021 0153   CREATININE 0.95 01/19/2021 0153   CREATININE 0.96 12/15/2020 0716   CALCIUM 9.0 01/19/2021 0153   GFRNONAA >60 01/19/2021 0153   GFRNONAA 60 (L) 12/15/2020 0716   GFRAA >60 03/17/2020 1102    COAG estimated creatinine clearance  is 45.9 mL/min (by C-G formula based on SCr of 0.95 mg/dL).  No results found for: PTT  Disposition:  Discharge to :Home Discharge Instructions     Activity as tolerated - No restrictions   Complete by: As directed    Call MD for:  redness, tenderness, or signs of infection (pain, swelling, bleeding, redness, odor or green/yellow discharge around incision site)   Complete by: As directed    Call MD for:  severe or increased pain, loss or decreased feeling  in affected limb(s)   Complete by: As directed    Call MD for:  temperature >100.5   Complete by: As directed    Change dressing (specify)   Complete by: As  directed    Keep clean dressing over right foot amputation. Cover with clean gauze, kerlix, ace wrap. Change daily and as needed. May WBAT RLE. Use postop shoe when ambulating.   Discharge patient   Complete by: As directed    Discharge disposition: 01-Home or Self Care   Discharge patient date: 01/14/2021   Discharge patient   Complete by: As directed    Discharge disposition: 03-Skilled [place]   Discharge patient date: 01/19/2021   May shower    Complete by: As directed    Resume previous diet   Complete by: As directed       Allergies as of 01/20/2021       Reactions   Ibuprofen Nausea And Vomiting   Powder Rash   Gloves with powder        Medication List     TAKE these medications    acetaminophen 500 MG tablet Commonly known as: TYLENOL Take 500 mg by mouth every 6 (six) hours as needed for mild pain.   alendronate 70 MG tablet Commonly known as: FOSAMAX Take 70 mg by mouth every Monday.   aspirin 81 MG EC tablet Take 1 tablet (81 mg total) by mouth daily. Swallow whole.   atorvastatin 40 MG tablet Commonly known as: LIPITOR Take 1 tablet (40 mg total) by mouth daily.   cephALEXin 500 MG capsule Commonly known as: KEFLEX Take 500 mg by mouth 2 (two) times daily.   clopidogrel 75 MG tablet Commonly known as: PLAVIX Take 1 tablet (75 mg total) by mouth daily.   Fish Oil 500 MG Caps Take 500 mg by mouth daily.   glucosamine-chondroitin 500-400 MG tablet Take 1 tablet by mouth daily.   hydrochlorothiazide 12.5 MG capsule Commonly known as: MICROZIDE Take 6.25 mg by mouth daily at 2 PM.   HYDROcodone-acetaminophen 5-325 MG tablet Commonly known as: NORCO/VICODIN Take 1 tablet by mouth every 6 (six) hours as needed. What changed:  when to take this reasons to take this   meclizine 12.5 MG tablet Commonly known as: ANTIVERT Take 12.5 mg by mouth daily as needed for dizziness.   multivitamins with iron Tabs tablet Take 1 tablet by mouth  daily.   Oyster Shell Calcium w/D 500-200 MG-UNIT Tabs Take 1 tablet by mouth 3 (three) times daily.   Tagrisso 80 MG tablet Generic drug: osimertinib mesylate Take 1 tablet (80 mg total) by mouth daily.   vitamin B-12 500 MCG tablet Commonly known as: CYANOCOBALAMIN Take 500 mcg by mouth daily.       ASK your doctor about these medications    metoprolol succinate 25 MG 24 hr tablet Commonly known as: Toprol XL Take 1 tablet (25 mg total) by mouth 2 (two) times daily.  Signed: Cherre Robins 01/20/2021, 11:48 AM

## 2021-01-20 NOTE — Consult Note (Signed)
[facility] CM Inpatient Consult   01/20/2021  Chidinma L Kimoto 08/11/1940 [id number]  [facility] [[facility]]  Accountable Care Organization [ACO] Patient: [employer] Medicare  Primary Care Provider:  Fanta, Tesfaye, MD  Patient screened for hospitalization length of stay and  to assess for potential [facility]  [[facility]] Care Management service needs for post hospital transition.  Review of patient's medical record reveals patient is for a skilled nursing facility level of care for post hospital needs per PT/OT evaluations/recommendations.  Plan:  Continue to follow progress and disposition to assess for post hospital care management needs as awaiting insurance authorization is noted.  If patient transitions to a SNF level of care then needs for post hospital transition is to be met at that level for rehab currently.  For questions contact:   Victoria Brewer, RN BSN CCM [facility] Hospital Liaison  [phone] business mobile phone Toll free office [phone]  Fax number: [phone] [email or web address]     

## 2021-01-20 NOTE — TOC Progression Note (Signed)
Transition of Care [facility]) - Progression Note    Patient Details  Name: Heather [name] MRN: [id number] Date of Birth: 07/04/1941  [facility] CM/SW Contact  Vinie Sill, [place] Phone Number: 01/20/2021, 11:25 AM  Clinical Narrative:     Patient's daughter,Patricia informed CSW she was not satisfied with [facility]  and requested SNF change to [facility].   CSW contacted [facility] and confirmed bed offer. CSW contacted insurance to change facility, new reference # [id number]- insurance remains pending  TOC will continue to follow and assist with discharge planning.   Thurmond Butts, MSW, LCSW Clinical Social Worker     Expected Discharge Plan: Skilled Nursing Facility Barriers to Discharge: SNF Pending bed offer  Expected Discharge Plan and Services Expected Discharge Plan: [place] In-house Referral: Clinical Social Work Discharge Planning Services: CM Consult Post Acute Care Choice: [place] arrangements for the past 2 months: Single Family Home Expected Discharge Date: 01/14/21                                     Social Determinants of Health (SDOH) Interventions    Readmission Risk Interventions No flowsheet data found.

## 2021-01-23 ENCOUNTER — Telehealth: Payer: Self-pay | Admitting: Physician Assistant

## 2021-01-23 ENCOUNTER — Telehealth: Payer: Self-pay | Admitting: Medical Oncology

## 2021-01-23 DIAGNOSIS — I70262 Atherosclerosis of native arteries of extremities with gangrene, left leg: Secondary | ICD-10-CM | POA: Diagnosis not present

## 2021-01-23 DIAGNOSIS — I739 Peripheral vascular disease, unspecified: Secondary | ICD-10-CM | POA: Diagnosis not present

## 2021-01-23 DIAGNOSIS — I96 Gangrene, not elsewhere classified: Secondary | ICD-10-CM | POA: Diagnosis not present

## 2021-01-23 DIAGNOSIS — Z89422 Acquired absence of other left toe(s): Secondary | ICD-10-CM | POA: Diagnosis not present

## 2021-01-23 NOTE — Telephone Encounter (Signed)
F/U appt-LVM on Patricia's phone re: our conversation from last week .  I did not hear back where pt was residing.   Per chart, pt was discharged to [facility] care so I sent a schedule message for appt t his week . I asked [name] to call back.

## 2021-01-23 NOTE — Telephone Encounter (Signed)
Scheduled appts per 7/18 sch msg. [facility] is aware of appts and I left a msg for pt's daughter with appts date and times.

## 2021-01-23 NOTE — Progress Notes (Signed)
[place], [place], MD [address] [place] [address]  DIAGNOSIS:  1) Stage IA non-small cell lung, adenocarcinoma diagnosed in March 2013. 2) non-small cell lung cancer, adenocarcinoma in January 2019  2) Non-Small Cell lung Cancer, adenocarcinoma, in the left upper lobe suspicious for stage IA.  She also has some other small pulmonary nodules that could be low-grade adenocarcinoma.    Biomarker Findings Microsatellite status - Cannot Be Determined ? Tumor Mutational Burden - 3 Muts/Mb Genomic Findings For a complete list of the genes assayed, please refer to the Appendix. EGFR L858R MTAP loss exons 3-8 CDKN2A/B CDKN2B loss, CDKN2A loss SMAD4 Q534* TP53 R248L 7 Disease relevant genes with no reportable alterations: ALK, BRAF, ERBB2, KRAS, MET, RET, ROS1   PDL1 expression: 1%  PRIOR THERAPY:  1) status post right upper lobectomy with lymph node dissection on September 24, 2011 2) status post stereotactic radiotherapy to the left upper lobe lung nodule in February 2019.   CURRENT THERAPY: Targeted treatment with Tagrisso 80 mg p.o. daily.  First dose around 12/17/20  INTERVAL HISTORY: Heather [name] 80 y.o. female returns to clinic today for a follow-up visit accompanied by her cousin. She is feeling unwell today and fatigued. The patient was last seen in the clinic on 12/15/2020.  Unfortunately in the interval, the patient has had significant issues related to peripheral vascular disease.  It started on 12/22/2020 when the patient presented to the emergency department after dropping a can of chili on her left foot.  She then presented to the emergency room again on 12/30/2020 due to worsening pain and swollen and painful fourth and fifth toes.  She was treated for cellulitis.  She then was seen in the hospital again on 01/02/2021 and admitted for ischemic toe and cellulitis. The patient was to schedule follow-up with [facility]  vascular surgery for revascularization; however, she was not deemed emergent at time of her hospitalization.  Patient reports that she has had severe pain and increasing discoloration of her toes.  She has not been able to make her appointment with [facility] vascular surgery and presented to [facility] for increasing and worsening pain with ischemic left toes on 01/12/21. She was admitted and arrangements made for angiogram. She was discharged on 01/14/21. She presented to the hospital again and underwent amputation of the left 4th and 5th toes. She currently is at [facility] in rehab.   She has an appointment with vascular surgery next week or so.   Otherwise, at the patient's last appointment regarding her lung cancer, the patient was started on targeted treatment with Tagrisso.  Her first dose was on 12/17/2020 regarding her Tagrisso, the patient denies any abnormal side effects except she has some diarrhea 2-3 times per day; however, this started after taking antibiotics. She takes imodium if needed. She stopped taking this about last week when she ran out of her 30 day supply. She denies any rashes or skin changes.  She denies any fever, chills, or night sweats. She lost about 10 lbs in the interval since her hospitalization. She reports a cough and occasional blood-tinged sputum which is new. Of note, she is on plavix and aspirin which was started with the recent bout of vascular issues.  She reports her baseline dyspnea on exertion; however, she is on 2 L of oxygen which is new.  She denies any chest pain. She has mild nausea. She denies any vomiting or constipation.  She denies any headache or visual changes.  The patient is here today for evaluation and repeat blood work and to manage any adverse side effects of treatment from [place].   MEDICAL HISTORY: Past Medical History:  Diagnosis Date   Arthritis    reports her joints are fine and she has no pain    Ascending aortic aneurysm ([place]) 07/20/2014    4.1 cm by CT   Atrial fib/flutter, transient    post-op   CHF (congestive heart failure) ([place])    a. EF 30-35% by echo in 2013 --> similar results by repeat imaging in 2016 and 11/2017.   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 ([place]) 09/2011   Stage IB- s/p right upper lobectomy   Pneumonia    Psoriasis    Ankles   Tobacco abuse     ALLERGIES:  is allergic to ibuprofen and powder.  MEDICATIONS:  Current Outpatient Medications  Medication Sig Dispense Refill   acetaminophen (TYLENOL) 500 MG tablet Take 500 mg by mouth every 6 (six) hours as needed for mild pain.     alendronate (FOSAMAX) 70 MG tablet Take 70 mg by mouth every Monday.   3   aspirin EC 81 MG EC tablet Take 1 tablet (81 mg total) by mouth daily. Swallow whole. 30 tablet 11   atorvastatin (LIPITOR) 40 MG tablet Take 1 tablet (40 mg total) by mouth daily. 30 tablet 6   Calcium Carb-Cholecalciferol (OYSTER SHELL CALCIUM W/D) 500-200 MG-UNIT TABS Take 1 tablet by mouth 3 (three) times daily.     cephALEXin (KEFLEX) 500 MG capsule Take 500 mg by mouth 2 (two) times daily.     clopidogrel (PLAVIX) 75 MG tablet Take 1 tablet (75 mg total) by mouth daily. 30 tablet 6   glucosamine-chondroitin 500-400 MG tablet Take 1 tablet by mouth daily.     hydrochlorothiazide (MICROZIDE) 12.5 MG capsule Take 6.25 mg by mouth daily at 2 PM.  3   HYDROcodone-acetaminophen (NORCO/VICODIN) 5-325 MG tablet Take 1 tablet by mouth every 6 (six) hours as needed. 12 tablet 0   meclizine (ANTIVERT) 12.5 MG tablet Take 12.5 mg by mouth daily as needed for dizziness.     metoprolol succinate (TOPROL XL) 25 MG 24 hr tablet Take 1 tablet (25 mg total) by mouth 2 (two) times daily. (Patient taking differently: Take 25 mg by mouth daily.) 60 tablet 6   Multiple Vitamins-Iron (MULTIVITAMINS WITH IRON) TABS Take 1 tablet by mouth daily.     Omega-3 Fatty  Acids (FISH OIL) 500 MG CAPS Take 500 mg by mouth daily.      osimertinib mesylate (TAGRISSO) 80 MG tablet Take 1 tablet (80 mg total) by mouth daily. 30 tablet 3   potassium chloride SA (KLOR-CON) 20 MEQ tablet Take 1 tablet (20 mEq total) by mouth daily. 7 tablet 0   prochlorperazine (COMPAZINE) 10 MG tablet Take 1 tablet (10 mg total) by mouth every 6 (six) hours as needed. 30 tablet 2   vitamin B-12 (CYANOCOBALAMIN) 500 MCG tablet Take 500 mcg by mouth daily.     No current facility-administered medications for this visit.    SURGICAL HISTORY:  Past Surgical History:  Procedure Laterality Date   ABDOMINAL AORTOGRAM W/LOWER EXTREMITY N/A 01/12/2021   Procedure: ABDOMINAL AORTOGRAM W/LOWER EXTREMITY;  Surgeon: Marty Heck, MD;  Location: [facility];  Service: Cardiovascular;  Laterality: N/A;   AMPUTATION Left 01/18/2021   Procedure: LEFT FOURTH AND FIFTH TOE AMPUTATION;  Surgeon: Cherre Robins, MD;  Location: [facility];  Service: Vascular;  Laterality: Left;   CATARACT EXTRACTION Bilateral 2016   COLONOSCOPY  01/22/2012   Procedure: COLONOSCOPY;  Surgeon: Danie Binder, MD;  [facility];  Service: Endoscopy;  Laterality: N/A;  10:45   COLONOSCOPY N/A 04/15/2017   Four 4-6 mm polyps in descending colon, ascending, and cecum. One 15 mm polyp in proximal ascending colon, one 4 mm poylp at hepatic flexure, two 3-4 mm polyps in sigmoid and descending. Internal hemorrhoids. Colonoscopy in 3-5 years if benefits outweigh risks. Simple adenomas.    IR RADIOLOGIST EVAL & MGMT  03/04/2018   PERIPHERAL VASCULAR INTERVENTION Left 01/12/2021   Procedure: PERIPHERAL VASCULAR INTERVENTION;  Surgeon: Marty Heck, MD;  Location: [facility];  Service: Cardiovascular;  Laterality: Left;  sfa   POLYPECTOMY  04/15/2017   Procedure: POLYPECTOMY;  Surgeon: Danie Binder, MD;  [facility];  Service: Endoscopy;;  ascending colon   RADIOLOGY WITH ANESTHESIA N/A  03/19/2018   Procedure: CT MICROWAVE THERMAL ABLATION WITH ANESTHESIA;  Surgeon: Jacqulynn Cadet, MD;  Location: [facility];  Service: Anesthesiology;  Laterality: N/A;   right VATS (upper lobectomy)  08/29/11   Dr Roxan Hockey   VIDEO BRONCHOSCOPY WITH ENDOBRONCHIAL NAVIGATION N/A 03/18/2015   Procedure: VIDEO BRONCHOSCOPY WITH ENDOBRONCHIAL NAVIGATION;  Surgeon: [name], MD;  Location: [facility];  Service: Thoracic;  Laterality: N/A;   VIDEO BRONCHOSCOPY WITH ENDOBRONCHIAL NAVIGATION N/A 11/17/2020   Procedure: VIDEO BRONCHOSCOPY WITH ENDOBRONCHIAL NAVIGATION;  Surgeon: [name], MD;  Location: [facility];  Service: Thoracic;  Laterality: N/A;    REVIEW OF SYSTEMS:   Review of Systems  Constitutional: Positive for fatigue, weight loss, and appetite change. Negative for chills and fever.  HENT: Negative for mouth sores, nosebleeds, sore throat and trouble swallowing.   Eyes: Negative for eye problems and icterus.  Respiratory: Positive for blood tinged sputum and dyspnea on exertion. Negative for wheezing.   Cardiovascular: Negative for chest pain and leg swelling.  Gastrointestinal: Positive for nausea and diarrhea. Negative for abdominal pain, constipation, and vomiting.  Genitourinary: Negative for bladder incontinence, difficulty urinating, dysuria, frequency and hematuria.   Musculoskeletal: Negative for back pain, gait problem, neck pain and neck stiffness.  Skin: Negative for itching and rash.  Neurological: Negative for dizziness, extremity weakness, gait problem, headaches, light-headedness and seizures.  Hematological: Negative for adenopathy. Does not bruise/bleed easily.  Psychiatric/Behavioral: Negative for confusion, depression and sleep disturbance. The patient is not nervous/anxious.     PHYSICAL EXAMINATION:  Blood pressure (!) 130/114, pulse (!) 56, temperature (!) 95.8 F (35.4 C), temperature source Tympanic, resp. rate 17, SpO2 97 %.  ECOG PERFORMANCE  STATUS: 3  Physical Exam  Constitutional: Oriented to person, place, and time and chronically ill appearing female and in no distress.  HENT:  Head: Normocephalic and atraumatic.  Mouth/Throat: Oropharynx is clear and moist. No oropharyngeal exudate.  Eyes: Conjunctivae are normal. Right eye exhibits no discharge. Left eye exhibits no discharge. No scleral icterus.  Neck: Normal range of motion. Neck supple.  Cardiovascular: Normal rate, regular rhythm, normal heart sounds and intact distal pulses.   Pulmonary/Chest: Effort normal and breath sounds normal. No respiratory distress. No wheezes. No rales.  Abdominal: Soft. Bowel sounds are normal. Exhibits no distension and no mass. There is no tenderness.  Musculoskeletal: Normal range of motion. Lymphadenopathy:    No cervical adenopathy.  Neurological: Alert and oriented to person, place, and time. Exhibits muscle wasting. Examined in the wheelchair.  Skin: Skin is warm and dry. No rash noted. Not diaphoretic. No erythema. No pallor.  Psychiatric: Mood, memory and judgment normal.  Vitals reviewed.  LABORATORY DATA: Lab Results  Component Value Date   WBC 15.8 (H) 01/24/2021   HGB 10.5 (L) 01/24/2021   HCT 30.5 (L) 01/24/2021   MCV 91.3 01/24/2021   PLT 98 (L) 01/24/2021      Chemistry      Component Value Date/Time   NA 146 (H) 01/24/2021 0958   K 3.1 (L) 01/24/2021 0958   CL 103 01/24/2021 0958   CO2 28 01/24/2021 0958   BUN 34 (H) 01/24/2021 0958   CREATININE 0.95 01/24/2021 0958      Component Value Date/Time   CALCIUM 10.1 01/24/2021 0958   ALKPHOS 202 (H) 01/24/2021 0958   AST 55 (H) 01/24/2021 0958   ALT 58 (H) 01/24/2021 0958   BILITOT 1.3 (H) 01/24/2021 0958       RADIOGRAPHIC STUDIES:  DG Chest 2 View  Result Date: 01/11/2021 CLINICAL DATA:  Hemoptysis.  History of lung cancer EXAM: CHEST - 2 VIEW COMPARISON:  11/15/2020 FINDINGS: Cardiomegaly. Stable aortic and hilar contours. Interstitial coarsening  with chronic patchy bilateral pulmonary opacity greatest at the left apex and right base. No Kerley lines, effusion, or pneumothorax. IMPRESSION: 1. No localized source of hemoptysis. 2. Chronic cardiomegaly and bilateral pulmonary opacities. Electronically Signed   By: Monte Fantasia M.D.   On: 01/11/2021 04:27   PERIPHERAL VASCULAR CATHETERIZATION  Result Date: 01/12/2021 Formatting of this result is different from the original. Patient name: BRITTANYANN WITTNER         MRN: [id number]        DOB: 04-30-1941          Sex: female   01/12/2021 Pre-operative Diagnosis: Critical limb ischemia of left lower extremity with tissue loss Post-operative diagnosis:  Same Surgeon:  Marty Heck, MD Procedure Performed: 1.  Ultrasound-guided access of right common femoral artery 2.  Aortogram including catheter selection of aorta 3.  Left lower extremity arteriogram with selection of third order branches 4.  Primary stent of left distal SFA/above knee popliteal artery high-grade stenosis with post balloon angioplasty (7 mm x 80 mm drug-coated Eluvia, postdilated with a 6 mm balloon)   Indications: 80 year old female seen in consultation yesterday by Dr. Stanford Breed with concern for ischemic left lower extremity.  She presents today for left lower extremity arteriogram plus intervention after risks benefits discussed.   Findings: Aortogram showed patent renal arteries and no flow-limiting stenosis in the aortoiliac segment..  Did have ectatic segment in the visceral segment of her abdominal aorta.   Left lower extremity runoff showed a patent common femoral and profunda with a focal high-grade greater than 80% stenosis in the distal SFA at Hunter's canal with some associated acute thrombus.  Distally the popliteal artery was widely patent with at least two-vessel runoff in the anterior tibial and posterior tibial artery given the peroneal was diminutive in the mid calf.   The lesion was crossed and primarily stented with a 7 mm  x 80 mm drug coated Eluvia that was postdilated with a 6 mm angioplasty balloon.  I elected primary simply stent given concern for acute thrombus associated with this lesion  Procedure:  The patient was identified in the holding area and taken to room 8.  The patient was then placed supine on the table and prepped and draped in the usual sterile fashion.  A time out was called.  Ultrasound was used to evaluate the right common femoral artery.  It was patent .  A digital ultrasound image was acquired.  A micropuncture needle was used to access the right common femoral artery under ultrasound guidance.  An 018 wire was advanced without resistance and a micropuncture sheath was placed.  The 018 wire was removed and a benson wire was placed.  The micropuncture sheath was exchanged for a 5 french sheath.  An omniflush catheter was advanced over the wire to the level of L-1.  An abdominal angiogram was obtained.  Next, using the omniflush catheter and a benson wire, the aortic bifurcation was crossed and the catheter was placed into theleft external iliac artery and left runoff was obtained.  Ultimately identified a high-grade greater than 80% stenosis in the distal left SFA at Hunter's canal.  We elected to treat this with primary stent placement.  A Glidewire advantage was then used to exchange for a long 6 French sheath in the right groin over the aortic bifurcation.  Patient was given 100 units/kg IV heparin.  I then crossed the SFA lesion with a Glidewire advantage and confirmed with hand-injection after we crossed the lesion.  This was then primarily stented with a 7 mm x 80 mm Eluvia postdilated with a 6 mm Mustang.  Final results are widely patent stent with no residual stenosis and preserved two-vessel runoff distally.  She was taken to holding to have sheath removed.   Complication: None   Condition: Stable         Marty Heck, MD [facility] Office:  [phone]    VAS [place] ABI WITH/WO TBI  Result Date: 01/12/2021  LOWER EXTREMITY DOPPLER STUDY Patient Name:  CARYS MALINA  Date of Exam:   01/11/2021 Medical Rec #: [id number]        Accession #:    [id number] Date of Birth: 07/07/1941        Patient Gender: F Patient Age:   79Y Exam Location:  [facility] Procedure:      VAS [place] ABI WITH/WO TBI Referring Phys: [id number] Yevonne Aline HAWKEN --------------------------------------------------------------------------------  Indications: Ulceration. High Risk Factors: Hypertension, Diabetes, past history of smoking. Other Factors: History of lung cancer.  Comparison Study: No prior study Performing Technologist: Sharion Dove RVS  Examination Guidelines: A complete evaluation includes at minimum, Doppler waveform signals and systolic blood pressure reading at the level of bilateral brachial, anterior tibial, and posterior tibial arteries, when vessel segments are accessible. Bilateral testing is considered an integral part of a complete examination. Photoelectric Plethysmograph (PPG) waveforms and toe systolic pressure readings are included as required and additional duplex testing as needed. Limited examinations for reoccurring indications may be performed as noted.  ABI Findings: +---------+------------------+-----+-----------+--------+ Right    Rt Pressure (mmHg)IndexWaveform   Comment  +---------+------------------+-----+-----------+--------+ Brachial 131                    multiphasic         +---------+------------------+-----+-----------+--------+ PTA      0                 0.00 absent              +---------+------------------+-----+-----------+--------+ DP  149               1.08 multiphasic         +---------+------------------+-----+-----------+--------+ Great Toe127               0.92                     +---------+------------------+-----+-----------+--------+ +---------+------------------+-----+-----------+-------+  Left     Lt Pressure (mmHg)IndexWaveform   Comment +---------+------------------+-----+-----------+-------+ Brachial 138                    multiphasic        +---------+------------------+-----+-----------+-------+ ATA      108               0.78 monophasic         +---------+------------------+-----+-----------+-------+ DP       97                0.70 monophasic         +---------+------------------+-----+-----------+-------+ Great Toe0                 0.00                    +---------+------------------+-----+-----------+-------+ +-------+-----------+-----------+------------+------------+ ABI/TBIToday's ABIToday's TBIPrevious ABIPrevious TBI +-------+-----------+-----------+------------+------------+ Right  1.08       0.92                                +-------+-----------+-----------+------------+------------+ Left   0.78       0.00                                +-------+-----------+-----------+------------+------------+  Summary: Right: Resting right ankle-brachial index is within normal range. No evidence of significant right lower extremity arterial disease. The right toe-brachial index is normal. No flow noted in the posterior tibial artery using the imager from the distal calf to the foot. Left: Resting left ankle-brachial index indicates moderate left lower extremity arterial disease. The left toe-brachial index is abnormal.  *See table(s) above for measurements and observations.  Electronically signed by Monica Martinez MD on 01/12/2021 at 5:37:21 PM.    Final      ASSESSMENT/PLAN:  This is a very pleasant 80 year old African-American female diagnosed originally with non-small cell lung cancer, adenocarcinoma in the right upper lobe in March 2013.  She also was diagnosed with non-small cell lung cancer, adenocarcinoma on the left upper lobe status post stereotactic radiotherapy to this lesion in 2019. She has a new left pulmonary nodule which was  positive for adenocarcinoma and another focus in the right lower lobe that is also suspicious for adenocarcinoma which was diagnosed in May 2022.  The patient's molecular studies shows that she is positive for an EGFR mutation.  The patient was started on Tagrisso 80 mg p.o. daily her first dose was on 12/17/2020.  The patient was seen with Dr. Julien Nordmann today.  Labs reviewed.  Recommend that she continue on the same treatment at the same dose.  I will arrange for restaging CT scan of the chest prior to her next follow-up visit in 3 weeks or so.  We will see him back for follow-up visit in 3 weeks for evaluation and to review her scan results.  She will continue to follow with vascular regarding her peripheral vascular disease. We also recommend that they discuss with them if  she needs to be on plavix and aspirin given her blood tinged sputum.   Her potassium is a little low. I sent a prescription for 20 meq to take 1 tablet daily.   She stopped taking her Tagrisso last week when she ran out of tablets. I let them know there are 2 refills on the prescription. Therefore, she needs to call the pharmacy to have them refill the new 30 day supply.   I have sent compazine to take 10 mg every 6 hours as needed for nausea to the pharmacy.   The patient was advised to call immediately if she has any concerning symptoms in the interval. The patient voices understanding of current disease status and treatment options and is in agreement with the current care plan. All questions were answered. The patient knows to call the clinic with any problems, questions or concerns. We can certainly see the patient much sooner if necessary   Orders Placed This Encounter  Procedures   CT Chest W Contrast    Standing Status:   Future    Standing Expiration Date:   01/24/2022    Order Specific Question:   If indicated for the ordered procedure, I authorize the administration of contrast media per Radiology protocol     Answer:   Yes    Order Specific Question:   Preferred imaging location?    Answer:   [facility]   CT Abdomen Pelvis W Contrast    Standing Status:   Future    Standing Expiration Date:   01/24/2022    Order Specific Question:   If indicated for the ordered procedure, I authorize the administration of contrast media per Radiology protocol    Answer:   Yes    Order Specific Question:   Preferred imaging location?    Answer:   [facility]    Order Specific Question:   Is Oral Contrast requested for this exam?    Answer:   Yes, Per Radiology protocol   CBC with Differential ([facility] Only)    Standing Status:   Future    Standing Expiration Date:   01/24/2022   CMP ([place] only)    Standing Status:   Future    Standing Expiration Date:   01/24/2022      Tobe Sos Ondine Gemme, PA-C 01/24/21  ADDENDUM: Hematology/Oncology Attending: I had a face-to-face encounter with the patient today.  I reviewed her record, lab and recommended her care plan.  This is a very pleasant 80 years old African-American female who was diagnosed with stage Ia non-small cell lung cancer in March 2013 status post right upper lobectomy followed by SBRT to left upper lobe lung nodule in February 2019 for disease recurrence in that area.  The patient was recently found to have metastatic disease with bilateral pulmonary nodules in addition to mediastinal lymphadenopathy.  Her molecular studies showed positive EGFR mutation in exon 21 (L858R). The patient is started treatment with Tagrisso 80 mg p.o. daily status post 1 months.  She was admitted to the hospital recently with peripheral vascular disease and ischemic necrosis and dry gangrene of the left fourth and fifth toes status post amputation on January 18, 2021.  The patient was recently discharged from the hospital and she has been off treatment with Tagrisso for the last week. She continues to have mild fatigue and weakness. I  recommended for the patient to resume her treatment with Tagrisso with the same dose. We will see her back  for follow-up visit and 1 months for evaluation and repeat CT scan of the chest, abdomen pelvis for restaging of her disease. The patient's daughter was available by phone and she had several questions about her mother's condition and prognosis and I answered her questions to her satisfaction. The patient was advised to call immediately if she has any other concerning symptoms in the interval.  The total time spent in the appointment was 30 minutes. Disclaimer: This note was dictated with voice recognition software. Similar sounding words can inadvertently be transcribed and may be missed upon review. Eilleen Kempf, MD 01/24/21

## 2021-01-24 ENCOUNTER — Other Ambulatory Visit: Payer: Self-pay

## 2021-01-24 ENCOUNTER — Inpatient Hospital Stay: Payer: Medicare Other | Attending: Internal Medicine | Admitting: Physician Assistant

## 2021-01-24 ENCOUNTER — Other Ambulatory Visit (HOSPITAL_COMMUNITY): Payer: Self-pay

## 2021-01-24 ENCOUNTER — Inpatient Hospital Stay: Payer: Medicare Other

## 2021-01-24 ENCOUNTER — Encounter: Payer: Self-pay | Admitting: Physician Assistant

## 2021-01-24 VITALS — BP 130/114 | HR 56 | Temp 95.8°F | Resp 17

## 2021-01-24 DIAGNOSIS — I110 Hypertensive heart disease with heart failure: Secondary | ICD-10-CM | POA: Diagnosis not present

## 2021-01-24 DIAGNOSIS — I70262 Atherosclerosis of native arteries of extremities with gangrene, left leg: Secondary | ICD-10-CM | POA: Diagnosis not present

## 2021-01-24 DIAGNOSIS — G9341 Metabolic encephalopathy: Secondary | ICD-10-CM | POA: Diagnosis not present

## 2021-01-24 DIAGNOSIS — J440 Chronic obstructive pulmonary disease with acute lower respiratory infection: Secondary | ICD-10-CM | POA: Diagnosis not present

## 2021-01-24 DIAGNOSIS — Z20822 Contact with and (suspected) exposure to covid-19: Secondary | ICD-10-CM | POA: Diagnosis not present

## 2021-01-24 DIAGNOSIS — J9601 Acute respiratory failure with hypoxia: Secondary | ICD-10-CM | POA: Diagnosis not present

## 2021-01-24 DIAGNOSIS — C3492 Malignant neoplasm of unspecified part of left bronchus or lung: Secondary | ICD-10-CM | POA: Diagnosis not present

## 2021-01-24 DIAGNOSIS — I313 Pericardial effusion (noninflammatory): Secondary | ICD-10-CM | POA: Diagnosis not present

## 2021-01-24 DIAGNOSIS — E1152 Type 2 diabetes mellitus with diabetic peripheral angiopathy with gangrene: Secondary | ICD-10-CM | POA: Diagnosis not present

## 2021-01-24 DIAGNOSIS — Z515 Encounter for palliative care: Secondary | ICD-10-CM | POA: Diagnosis not present

## 2021-01-24 DIAGNOSIS — I4892 Unspecified atrial flutter: Secondary | ICD-10-CM | POA: Diagnosis not present

## 2021-01-24 DIAGNOSIS — Z7901 Long term (current) use of anticoagulants: Secondary | ICD-10-CM | POA: Diagnosis not present

## 2021-01-24 DIAGNOSIS — J90 Pleural effusion, not elsewhere classified: Secondary | ICD-10-CM | POA: Diagnosis not present

## 2021-01-24 DIAGNOSIS — C3412 Malignant neoplasm of upper lobe, left bronchus or lung: Secondary | ICD-10-CM

## 2021-01-24 DIAGNOSIS — I509 Heart failure, unspecified: Secondary | ICD-10-CM | POA: Diagnosis not present

## 2021-01-24 DIAGNOSIS — R1084 Generalized abdominal pain: Secondary | ICD-10-CM | POA: Diagnosis not present

## 2021-01-24 DIAGNOSIS — I714 Abdominal aortic aneurysm, without rupture: Secondary | ICD-10-CM | POA: Diagnosis not present

## 2021-01-24 DIAGNOSIS — J9621 Acute and chronic respiratory failure with hypoxia: Secondary | ICD-10-CM | POA: Diagnosis not present

## 2021-01-24 DIAGNOSIS — I4891 Unspecified atrial fibrillation: Secondary | ICD-10-CM | POA: Diagnosis not present

## 2021-01-24 DIAGNOSIS — R0602 Shortness of breath: Secondary | ICD-10-CM | POA: Diagnosis not present

## 2021-01-24 DIAGNOSIS — I517 Cardiomegaly: Secondary | ICD-10-CM | POA: Diagnosis not present

## 2021-01-24 DIAGNOSIS — Z9981 Dependence on supplemental oxygen: Secondary | ICD-10-CM | POA: Diagnosis not present

## 2021-01-24 DIAGNOSIS — E440 Moderate protein-calorie malnutrition: Secondary | ICD-10-CM | POA: Diagnosis not present

## 2021-01-24 DIAGNOSIS — I5023 Acute on chronic systolic (congestive) heart failure: Secondary | ICD-10-CM | POA: Diagnosis not present

## 2021-01-24 DIAGNOSIS — I248 Other forms of acute ischemic heart disease: Secondary | ICD-10-CM | POA: Diagnosis not present

## 2021-01-24 DIAGNOSIS — I82452 Acute embolism and thrombosis of left peroneal vein: Secondary | ICD-10-CM | POA: Diagnosis not present

## 2021-01-24 DIAGNOSIS — Z66 Do not resuscitate: Secondary | ICD-10-CM | POA: Diagnosis not present

## 2021-01-24 DIAGNOSIS — E870 Hyperosmolality and hypernatremia: Secondary | ICD-10-CM | POA: Diagnosis not present

## 2021-01-24 DIAGNOSIS — C3491 Malignant neoplasm of unspecified part of right bronchus or lung: Secondary | ICD-10-CM | POA: Diagnosis not present

## 2021-01-24 DIAGNOSIS — I739 Peripheral vascular disease, unspecified: Secondary | ICD-10-CM | POA: Diagnosis not present

## 2021-01-24 DIAGNOSIS — D65 Disseminated intravascular coagulation [defibrination syndrome]: Secondary | ICD-10-CM | POA: Diagnosis not present

## 2021-01-24 DIAGNOSIS — I712 Thoracic aortic aneurysm, without rupture: Secondary | ICD-10-CM | POA: Diagnosis not present

## 2021-01-24 DIAGNOSIS — C3490 Malignant neoplasm of unspecified part of unspecified bronchus or lung: Secondary | ICD-10-CM | POA: Diagnosis not present

## 2021-01-24 DIAGNOSIS — I4819 Other persistent atrial fibrillation: Secondary | ICD-10-CM | POA: Diagnosis not present

## 2021-01-24 LAB — CBC WITH DIFFERENTIAL (CANCER CENTER ONLY)
Abs Immature Granulocytes: 0.08 10*3/uL — ABNORMAL HIGH (ref 0.00–0.07)
Basophils Absolute: 0 10*3/uL (ref 0.0–0.1)
Basophils Relative: 0 %
Eosinophils Absolute: 0 10*3/uL (ref 0.0–0.5)
Eosinophils Relative: 0 %
HCT: 30.5 % — ABNORMAL LOW (ref 36.0–46.0)
Hemoglobin: 10.5 g/dL — ABNORMAL LOW (ref 12.0–15.0)
Immature Granulocytes: 1 %
Lymphocytes Relative: 19 %
Lymphs Abs: 2.9 10*3/uL (ref 0.7–4.0)
MCH: 31.4 pg (ref 26.0–34.0)
MCHC: 34.4 g/dL (ref 30.0–36.0)
MCV: 91.3 fL (ref 80.0–100.0)
Monocytes Absolute: 0.7 10*3/uL (ref 0.1–1.0)
Monocytes Relative: 4 %
Neutro Abs: 12.1 10*3/uL — ABNORMAL HIGH (ref 1.7–7.7)
Neutrophils Relative %: 76 %
Platelet Count: 98 10*3/uL — ABNORMAL LOW (ref 150–400)
RBC: 3.34 MIL/uL — ABNORMAL LOW (ref 3.87–5.11)
RDW: 16.1 % — ABNORMAL HIGH (ref 11.5–15.5)
WBC Count: 15.8 10*3/uL — ABNORMAL HIGH (ref 4.0–10.5)
nRBC: 0.1 % (ref 0.0–0.2)

## 2021-01-24 LAB — CMP (CANCER CENTER ONLY)
ALT: 58 U/L — ABNORMAL HIGH (ref 0–44)
AST: 55 U/L — ABNORMAL HIGH (ref 15–41)
Albumin: 3.2 g/dL — ABNORMAL LOW (ref 3.5–5.0)
Alkaline Phosphatase: 202 U/L — ABNORMAL HIGH (ref 38–126)
Anion gap: 15 (ref 5–15)
BUN: 34 mg/dL — ABNORMAL HIGH (ref 8–23)
CO2: 28 mmol/L (ref 22–32)
Calcium: 10.1 mg/dL (ref 8.9–10.3)
Chloride: 103 mmol/L (ref 98–111)
Creatinine: 0.95 mg/dL (ref 0.44–1.00)
GFR, Estimated: >60 mL/min (ref >60)
Glucose, Bld: 147 mg/dL — ABNORMAL HIGH (ref 70–99)
Potassium: 3.1 mmol/L — ABNORMAL LOW (ref 3.5–5.1)
Sodium: 146 mmol/L — ABNORMAL HIGH (ref 135–145)
Total Bilirubin: 1.3 mg/dL — ABNORMAL HIGH (ref 0.3–1.2)
Total Protein: 6.5 g/dL (ref 6.5–8.1)

## 2021-01-24 MED ORDER — POTASSIUM CHLORIDE CRYS ER 20 MEQ PO TBCR
20.0000 meq | EXTENDED_RELEASE_TABLET | Freq: Every day | ORAL | 0 refills | Status: AC
  Filled 2021-01-24: qty 7, 7d supply, fill #0

## 2021-01-24 MED ORDER — PROCHLORPERAZINE MALEATE 10 MG PO TABS: 10.0000 mg | ORAL_TABLET | Freq: Four times a day (QID) | ORAL | 2 refills | Status: DC | PRN

## 2021-01-24 MED ORDER — PROCHLORPERAZINE MALEATE 10 MG PO TABS
10.0000 mg | ORAL_TABLET | Freq: Four times a day (QID) | ORAL | 2 refills | Status: AC | PRN
  Filled 2021-01-24: qty 30, 8d supply, fill #0

## 2021-01-24 MED ORDER — POTASSIUM CHLORIDE CRYS ER 20 MEQ PO TBCR: 20.0000 meq | EXTENDED_RELEASE_TABLET | Freq: Every day | ORAL | 0 refills | Status: DC

## 2021-01-25 ENCOUNTER — Emergency Department (HOSPITAL_COMMUNITY): Payer: Medicare Other

## 2021-01-25 ENCOUNTER — Inpatient Hospital Stay (HOSPITAL_COMMUNITY)
Admission: EM | Admit: 2021-01-25 | Discharge: 2021-02-06 | DRG: 180 | Disposition: E | Payer: Medicare Other | Source: Skilled Nursing Facility | Attending: Internal Medicine | Admitting: Internal Medicine

## 2021-01-25 ENCOUNTER — Telehealth: Payer: Self-pay

## 2021-01-25 ENCOUNTER — Encounter (HOSPITAL_COMMUNITY): Payer: Self-pay | Admitting: Emergency Medicine

## 2021-01-25 ENCOUNTER — Other Ambulatory Visit: Payer: Self-pay

## 2021-01-25 ENCOUNTER — Emergency Department (HOSPITAL_COMMUNITY)
Admit: 2021-01-25 | Discharge: 2021-01-25 | Disposition: A | Discharge status: Home / Self Care | Payer: Medicare Other | Attending: Emergency Medicine | Admitting: Emergency Medicine

## 2021-01-25 DIAGNOSIS — R0902 Hypoxemia: Secondary | ICD-10-CM

## 2021-01-25 DIAGNOSIS — Z9109 Other allergy status, other than to drugs and biological substances: Secondary | ICD-10-CM

## 2021-01-25 DIAGNOSIS — E440 Moderate protein-calorie malnutrition: Secondary | ICD-10-CM | POA: Diagnosis present

## 2021-01-25 DIAGNOSIS — E876 Hypokalemia: Secondary | ICD-10-CM | POA: Diagnosis not present

## 2021-01-25 DIAGNOSIS — K642 Third degree hemorrhoids: Secondary | ICD-10-CM | POA: Diagnosis present

## 2021-01-25 DIAGNOSIS — G9341 Metabolic encephalopathy: Secondary | ICD-10-CM | POA: Diagnosis not present

## 2021-01-25 DIAGNOSIS — I313 Pericardial effusion (noninflammatory): Secondary | ICD-10-CM | POA: Diagnosis present

## 2021-01-25 DIAGNOSIS — L89151 Pressure ulcer of sacral region, stage 1: Secondary | ICD-10-CM | POA: Diagnosis not present

## 2021-01-25 DIAGNOSIS — Z7189 Other specified counseling: Secondary | ICD-10-CM | POA: Diagnosis not present

## 2021-01-25 DIAGNOSIS — I5023 Acute on chronic systolic (congestive) heart failure: Secondary | ICD-10-CM | POA: Diagnosis present

## 2021-01-25 DIAGNOSIS — C3492 Malignant neoplasm of unspecified part of left bronchus or lung: Secondary | ICD-10-CM | POA: Diagnosis not present

## 2021-01-25 DIAGNOSIS — Z89422 Acquired absence of other left toe(s): Secondary | ICD-10-CM

## 2021-01-25 DIAGNOSIS — J9601 Acute respiratory failure with hypoxia: Secondary | ICD-10-CM | POA: Diagnosis not present

## 2021-01-25 DIAGNOSIS — I82402 Acute embolism and thrombosis of unspecified deep veins of left lower extremity: Secondary | ICD-10-CM | POA: Diagnosis present

## 2021-01-25 DIAGNOSIS — F17210 Nicotine dependence, cigarettes, uncomplicated: Secondary | ICD-10-CM | POA: Diagnosis present

## 2021-01-25 DIAGNOSIS — I714 Abdominal aortic aneurysm, without rupture: Secondary | ICD-10-CM | POA: Diagnosis present

## 2021-01-25 DIAGNOSIS — E860 Dehydration: Secondary | ICD-10-CM | POA: Diagnosis present

## 2021-01-25 DIAGNOSIS — Z8673 Personal history of transient ischemic attack (TIA), and cerebral infarction without residual deficits: Secondary | ICD-10-CM

## 2021-01-25 DIAGNOSIS — Z9981 Dependence on supplemental oxygen: Secondary | ICD-10-CM | POA: Diagnosis not present

## 2021-01-25 DIAGNOSIS — I70262 Atherosclerosis of native arteries of extremities with gangrene, left leg: Secondary | ICD-10-CM | POA: Diagnosis not present

## 2021-01-25 DIAGNOSIS — I248 Other forms of acute ischemic heart disease: Secondary | ICD-10-CM | POA: Diagnosis present

## 2021-01-25 DIAGNOSIS — R339 Retention of urine, unspecified: Secondary | ICD-10-CM | POA: Diagnosis present

## 2021-01-25 DIAGNOSIS — I5021 Acute systolic (congestive) heart failure: Secondary | ICD-10-CM | POA: Diagnosis not present

## 2021-01-25 DIAGNOSIS — C3490 Malignant neoplasm of unspecified part of unspecified bronchus or lung: Secondary | ICD-10-CM | POA: Diagnosis not present

## 2021-01-25 DIAGNOSIS — Z7982 Long term (current) use of aspirin: Secondary | ICD-10-CM

## 2021-01-25 DIAGNOSIS — R0602 Shortness of breath: Secondary | ICD-10-CM | POA: Diagnosis not present

## 2021-01-25 DIAGNOSIS — Z66 Do not resuscitate: Secondary | ICD-10-CM | POA: Diagnosis not present

## 2021-01-25 DIAGNOSIS — Z6823 Body mass index (BMI) 23.0-23.9, adult: Secondary | ICD-10-CM

## 2021-01-25 DIAGNOSIS — Z7901 Long term (current) use of anticoagulants: Secondary | ICD-10-CM

## 2021-01-25 DIAGNOSIS — I517 Cardiomegaly: Secondary | ICD-10-CM | POA: Diagnosis not present

## 2021-01-25 DIAGNOSIS — C3412 Malignant neoplasm of upper lobe, left bronchus or lung: Principal | ICD-10-CM | POA: Diagnosis present

## 2021-01-25 DIAGNOSIS — Z7983 Long term (current) use of bisphosphonates: Secondary | ICD-10-CM

## 2021-01-25 DIAGNOSIS — J9621 Acute and chronic respiratory failure with hypoxia: Secondary | ICD-10-CM | POA: Diagnosis not present

## 2021-01-25 DIAGNOSIS — I4892 Unspecified atrial flutter: Secondary | ICD-10-CM | POA: Diagnosis present

## 2021-01-25 DIAGNOSIS — M1990 Unspecified osteoarthritis, unspecified site: Secondary | ICD-10-CM | POA: Diagnosis present

## 2021-01-25 DIAGNOSIS — I712 Thoracic aortic aneurysm, without rupture: Secondary | ICD-10-CM | POA: Diagnosis present

## 2021-01-25 DIAGNOSIS — I82452 Acute embolism and thrombosis of left peroneal vein: Secondary | ICD-10-CM | POA: Diagnosis not present

## 2021-01-25 DIAGNOSIS — Z7902 Long term (current) use of antithrombotics/antiplatelets: Secondary | ICD-10-CM

## 2021-01-25 DIAGNOSIS — Z886 Allergy status to analgesic agent status: Secondary | ICD-10-CM

## 2021-01-25 DIAGNOSIS — E1152 Type 2 diabetes mellitus with diabetic peripheral angiopathy with gangrene: Secondary | ICD-10-CM | POA: Diagnosis present

## 2021-01-25 DIAGNOSIS — I4891 Unspecified atrial fibrillation: Secondary | ICD-10-CM

## 2021-01-25 DIAGNOSIS — R54 Age-related physical debility: Secondary | ICD-10-CM | POA: Diagnosis present

## 2021-01-25 DIAGNOSIS — E870 Hyperosmolality and hypernatremia: Secondary | ICD-10-CM | POA: Diagnosis not present

## 2021-01-25 DIAGNOSIS — F419 Anxiety disorder, unspecified: Secondary | ICD-10-CM | POA: Diagnosis present

## 2021-01-25 DIAGNOSIS — I509 Heart failure, unspecified: Secondary | ICD-10-CM | POA: Diagnosis not present

## 2021-01-25 DIAGNOSIS — M79605 Pain in left leg: Secondary | ICD-10-CM | POA: Diagnosis not present

## 2021-01-25 DIAGNOSIS — Z20822 Contact with and (suspected) exposure to covid-19: Secondary | ICD-10-CM | POA: Diagnosis present

## 2021-01-25 DIAGNOSIS — L409 Psoriasis, unspecified: Secondary | ICD-10-CM | POA: Diagnosis present

## 2021-01-25 DIAGNOSIS — Z79899 Other long term (current) drug therapy: Secondary | ICD-10-CM

## 2021-01-25 DIAGNOSIS — L97521 Non-pressure chronic ulcer of other part of left foot limited to breakdown of skin: Secondary | ICD-10-CM | POA: Diagnosis not present

## 2021-01-25 DIAGNOSIS — Z538 Procedure and treatment not carried out for other reasons: Secondary | ICD-10-CM | POA: Diagnosis not present

## 2021-01-25 DIAGNOSIS — I110 Hypertensive heart disease with heart failure: Secondary | ICD-10-CM | POA: Diagnosis not present

## 2021-01-25 DIAGNOSIS — I493 Ventricular premature depolarization: Secondary | ICD-10-CM | POA: Diagnosis present

## 2021-01-25 DIAGNOSIS — J189 Pneumonia, unspecified organism: Secondary | ICD-10-CM | POA: Diagnosis present

## 2021-01-25 DIAGNOSIS — Z515 Encounter for palliative care: Secondary | ICD-10-CM

## 2021-01-25 DIAGNOSIS — R110 Nausea: Secondary | ICD-10-CM | POA: Diagnosis not present

## 2021-01-25 DIAGNOSIS — R0600 Dyspnea, unspecified: Secondary | ICD-10-CM | POA: Diagnosis not present

## 2021-01-25 DIAGNOSIS — R0689 Other abnormalities of breathing: Secondary | ICD-10-CM | POA: Diagnosis not present

## 2021-01-25 DIAGNOSIS — Z902 Acquired absence of lung [part of]: Secondary | ICD-10-CM

## 2021-01-25 DIAGNOSIS — J440 Chronic obstructive pulmonary disease with acute lower respiratory infection: Secondary | ICD-10-CM | POA: Diagnosis present

## 2021-01-25 DIAGNOSIS — R1310 Dysphagia, unspecified: Secondary | ICD-10-CM | POA: Diagnosis present

## 2021-01-25 DIAGNOSIS — C3491 Malignant neoplasm of unspecified part of right bronchus or lung: Secondary | ICD-10-CM | POA: Diagnosis present

## 2021-01-25 DIAGNOSIS — I4819 Other persistent atrial fibrillation: Secondary | ICD-10-CM | POA: Diagnosis present

## 2021-01-25 DIAGNOSIS — Z8249 Family history of ischemic heart disease and other diseases of the circulatory system: Secondary | ICD-10-CM

## 2021-01-25 DIAGNOSIS — J90 Pleural effusion, not elsewhere classified: Secondary | ICD-10-CM | POA: Diagnosis not present

## 2021-01-25 DIAGNOSIS — D65 Disseminated intravascular coagulation [defibrination syndrome]: Secondary | ICD-10-CM | POA: Diagnosis present

## 2021-01-25 DIAGNOSIS — Z743 Need for continuous supervision: Secondary | ICD-10-CM | POA: Diagnosis not present

## 2021-01-25 DIAGNOSIS — K648 Other hemorrhoids: Secondary | ICD-10-CM | POA: Diagnosis present

## 2021-01-25 DIAGNOSIS — R918 Other nonspecific abnormal finding of lung field: Secondary | ICD-10-CM | POA: Diagnosis not present

## 2021-01-25 DIAGNOSIS — L89152 Pressure ulcer of sacral region, stage 2: Secondary | ICD-10-CM | POA: Diagnosis present

## 2021-01-25 LAB — COMPREHENSIVE METABOLIC PANEL
ALT: 66 U/L — ABNORMAL HIGH (ref 0–44)
AST: 58 U/L — ABNORMAL HIGH (ref 15–41)
Albumin: 3.8 g/dL (ref 3.5–5.0)
Alkaline Phosphatase: 219 U/L — ABNORMAL HIGH (ref 38–126)
Anion gap: 14 (ref 5–15)
BUN: 48 mg/dL — ABNORMAL HIGH (ref 8–23)
CO2: 29 mmol/L (ref 22–32)
Calcium: 10.8 mg/dL — ABNORMAL HIGH (ref 8.9–10.3)
Chloride: 103 mmol/L (ref 98–111)
Creatinine, Ser: 1.11 mg/dL — ABNORMAL HIGH (ref 0.44–1.00)
GFR, Estimated: 50 mL/min — ABNORMAL LOW (ref >60)
Glucose, Bld: 141 mg/dL — ABNORMAL HIGH (ref 70–99)
Potassium: 3.5 mmol/L (ref 3.5–5.1)
Sodium: 146 mmol/L — ABNORMAL HIGH (ref 135–145)
Total Bilirubin: 1.5 mg/dL — ABNORMAL HIGH (ref 0.3–1.2)
Total Protein: 7.2 g/dL (ref 6.5–8.1)

## 2021-01-25 LAB — BLOOD GAS, VENOUS
Acid-Base Excess: 5.7 mmol/L — ABNORMAL HIGH (ref 0.0–2.0)
Bicarbonate: 30.6 mmol/L — ABNORMAL HIGH (ref 20.0–28.0)
O2 Saturation: 17 %
Patient temperature: 98.6
pCO2, Ven: 48 mmHg (ref 44.0–60.0)
pH, Ven: 7.421 (ref 7.250–7.430)
pO2, Ven: <31 mmHg — CL (ref 32.0–45.0)

## 2021-01-25 LAB — CBC WITH DIFFERENTIAL/PLATELET
Abs Immature Granulocytes: 0.2 10*3/uL — ABNORMAL HIGH (ref 0.00–0.07)
Basophils Absolute: 0 10*3/uL (ref 0.0–0.1)
Basophils Relative: 0 %
Eosinophils Absolute: 0 10*3/uL (ref 0.0–0.5)
Eosinophils Relative: 0 %
HCT: 35.3 % — ABNORMAL LOW (ref 36.0–46.0)
Hemoglobin: 11.5 g/dL — ABNORMAL LOW (ref 12.0–15.0)
Immature Granulocytes: 1 %
Lymphocytes Relative: 24 %
Lymphs Abs: 3.6 10*3/uL (ref 0.7–4.0)
MCH: 31.1 pg (ref 26.0–34.0)
MCHC: 32.6 g/dL (ref 30.0–36.0)
MCV: 95.4 fL (ref 80.0–100.0)
Monocytes Absolute: 0.8 10*3/uL (ref 0.1–1.0)
Monocytes Relative: 5 %
Neutro Abs: 10.7 10*3/uL — ABNORMAL HIGH (ref 1.7–7.7)
Neutrophils Relative %: 70 %
Platelets: 73 10*3/uL — ABNORMAL LOW (ref 150–400)
RBC: 3.7 MIL/uL — ABNORMAL LOW (ref 3.87–5.11)
RDW: 16.8 % — ABNORMAL HIGH (ref 11.5–15.5)
WBC: 15.2 10*3/uL — ABNORMAL HIGH (ref 4.0–10.5)
nRBC: 1.1 % — ABNORMAL HIGH (ref 0.0–0.2)

## 2021-01-25 LAB — LACTIC ACID, PLASMA: Lactic Acid, Venous: 2.1 mmol/L (ref 0.5–1.9)

## 2021-01-25 LAB — DIC (DISSEMINATED INTRAVASCULAR COAGULATION)PANEL
D-Dimer, Quant: 17.35 ug/mL-FEU — ABNORMAL HIGH (ref 0.00–0.50)
Fibrinogen: <60 mg/dL — CL (ref 210–475)
INR: 3.2 — ABNORMAL HIGH (ref 0.8–1.2)
Platelets: 76 10*3/uL — ABNORMAL LOW (ref 150–400)
Prothrombin Time: 32.6 seconds — ABNORMAL HIGH (ref 11.4–15.2)
aPTT: >200 seconds (ref 24–36)

## 2021-01-25 LAB — TROPONIN I (HIGH SENSITIVITY)
Troponin I (High Sensitivity): 177 ng/L (ref <18)
Troponin I (High Sensitivity): 165 ng/L (ref <18)

## 2021-01-25 LAB — BRAIN NATRIURETIC PEPTIDE: B Natriuretic Peptide: 758.5 pg/mL — ABNORMAL HIGH (ref 0.0–100.0)

## 2021-01-25 LAB — RESP PANEL BY RT-PCR (FLU A&B, COVID) ARPGX2
Influenza A by PCR: NEGATIVE
Influenza B by PCR: NEGATIVE
SARS Coronavirus 2 by RT PCR: NEGATIVE

## 2021-01-25 LAB — MRSA NEXT GEN BY PCR, NASAL: MRSA by PCR Next Gen: NOT DETECTED

## 2021-01-25 MED ORDER — PROCHLORPERAZINE MALEATE 10 MG PO TABS: 10.0000 mg | ORAL_TABLET | Freq: Four times a day (QID) | ORAL | Status: DC | PRN

## 2021-01-25 MED ORDER — SODIUM CHLORIDE 0.9 % IV SOLN
2.0000 g | INTRAVENOUS | Status: DC
  Administered 2021-01-25 – 2021-01-28 (×4): 2 g via INTRAVENOUS
  Filled 2021-01-25 (×3): qty 20
  Filled 2021-01-25: qty 2

## 2021-01-25 MED ORDER — IOHEXOL 350 MG/ML SOLN
100.0000 mL | Freq: Once | INTRAVENOUS | Status: AC | PRN
  Administered 2021-01-25: 75 mL via INTRAVENOUS

## 2021-01-25 MED ORDER — METOPROLOL SUCCINATE ER 25 MG PO TB24: 25.0000 mg | ORAL_TABLET | Freq: Every day | ORAL | Status: DC

## 2021-01-25 MED ORDER — DILTIAZEM HCL-DEXTROSE 125-5 MG/125ML-% IV SOLN (PREMIX)
2.5000 mg/h | INTRAVENOUS | Status: DC
  Administered 2021-01-25: 2.5 mg/h via INTRAVENOUS
  Administered 2021-01-26: 7.5 mg/h via INTRAVENOUS
  Filled 2021-01-25 (×2): qty 125

## 2021-01-25 MED ORDER — ATORVASTATIN CALCIUM 40 MG PO TABS
40.0000 mg | ORAL_TABLET | Freq: Every day | ORAL | Status: DC
  Administered 2021-01-26 – 2021-01-28 (×3): 40 mg via ORAL
  Filled 2021-01-25 (×3): qty 1

## 2021-01-25 MED ORDER — TAB-A-VITE/IRON PO TABS
1.0000 | ORAL_TABLET | Freq: Every day | ORAL | Status: DC
  Filled 2021-01-25: qty 1

## 2021-01-25 MED ORDER — HYDROCODONE-ACETAMINOPHEN 5-325 MG PO TABS: 1.0000 | ORAL_TABLET | Freq: Four times a day (QID) | ORAL | Status: DC | PRN

## 2021-01-25 MED ORDER — SODIUM CHLORIDE (PF) 0.9 % IJ SOLN
INTRAMUSCULAR | Status: AC
  Filled 2021-01-25: qty 50

## 2021-01-25 MED ORDER — CYANOCOBALAMIN 500 MCG PO TABS
500.0000 ug | ORAL_TABLET | Freq: Every day | ORAL | Status: DC
  Filled 2021-01-25: qty 1

## 2021-01-25 MED ORDER — ACETAMINOPHEN 500 MG PO TABS: 500.0000 mg | ORAL_TABLET | Freq: Four times a day (QID) | ORAL | Status: DC | PRN

## 2021-01-25 MED ORDER — FUROSEMIDE 10 MG/ML IJ SOLN: 20.0000 mg | Freq: Once | INTRAMUSCULAR | Status: DC

## 2021-01-25 MED ORDER — HEPARIN (PORCINE) 25000 UT/250ML-% IV SOLN
1000.0000 [IU]/h | INTRAVENOUS | Status: DC
  Administered 2021-01-25 – 2021-01-27 (×3): 1000 [IU]/h via INTRAVENOUS
  Filled 2021-01-25 (×3): qty 250

## 2021-01-25 MED ORDER — SODIUM CHLORIDE 0.9 % IV SOLN
500.0000 mg | INTRAVENOUS | Status: DC
  Administered 2021-01-25 – 2021-01-28 (×4): 500 mg via INTRAVENOUS
  Filled 2021-01-25 (×4): qty 500

## 2021-01-25 MED ORDER — HEPARIN BOLUS VIA INFUSION
2000.0000 [IU] | Freq: Once | INTRAVENOUS | Status: AC
  Administered 2021-01-25: 2000 [IU] via INTRAVENOUS
  Filled 2021-01-25: qty 2000

## 2021-01-25 MED ORDER — METOPROLOL TARTRATE 5 MG/5ML IV SOLN
5.0000 mg | Freq: Once | INTRAVENOUS | Status: AC
  Administered 2021-01-25: 5 mg via INTRAVENOUS
  Filled 2021-01-25: qty 5

## 2021-01-25 MED ORDER — FISH OIL 500 MG PO CAPS: 500.0000 mg | ORAL_CAPSULE | Freq: Every day | ORAL | Status: DC

## 2021-01-25 MED ORDER — PANTOPRAZOLE SODIUM 20 MG PO TBEC
20.0000 mg | DELAYED_RELEASE_TABLET | Freq: Every day | ORAL | Status: DC
  Filled 2021-01-25: qty 1

## 2021-01-25 MED ORDER — OMEGA-3-ACID ETHYL ESTERS 1 G PO CAPS
1.0000 g | ORAL_CAPSULE | Freq: Every day | ORAL | Status: DC
  Filled 2021-01-25: qty 1

## 2021-01-25 NOTE — ED Notes (Signed)
Patient transported to CT 

## 2021-01-25 NOTE — ED Notes (Signed)
Pt removed from bipap due to reports of feeling nauseous. Pt transitioned to [facility] at this time. Pt currently able to stay alert and take spontaneous breaths. MD Alcario Drought at bedside and aware.

## 2021-01-25 NOTE — Progress Notes (Signed)
[place] for IV heparin Indication: New Afib/RVR, possible PE  Allergies  Allergen Reactions   Ibuprofen Nausea And Vomiting   Powder Rash    Gloves with powder    Patient Measurements: Height: 5\' 7"  (170.2 cm) Weight: 70 kg (154 lb 5.2 oz) IBW/kg (Calculated) : 61.6 Heparin Dosing Weight: TBW (used [place] nomogram for initial dosing)  Vital Signs: Temp: 97.2 F (36.2 C) (07/20 1337) Temp Source: Oral (07/20 1337) BP: 101/83 (07/20 1600) Pulse Rate: 138 (07/20 1611)  Labs: Recent Labs    01/24/21 0958 01/13/2021 1406  HGB 10.5* 11.5*  HCT 30.5* 35.3*  PLT 98* PENDING  CREATININE 0.95 1.11*  TROPONINIHS  --  177*    Estimated Creatinine Clearance: 39.3 mL/min (A) (by C-G formula based on SCr of 1.11 mg/dL (H)).   Medical History: Past Medical History:  Diagnosis Date   Arthritis    reports her joints are fine and she has no pain    Ascending aortic aneurysm ([place]) 07/20/2014   4.1 cm by CT   Atrial fib/flutter, transient    post-op   CHF (congestive heart failure) ([place])    a. EF 30-35% by echo in 2013 --> similar results by repeat imaging in 2016 and 11/2017.   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 ([place]) 09/2011   Stage IB- s/p right upper lobectomy   Pneumonia    Psoriasis    Ankles   Tobacco abuse     Medications:  (Not in a hospital admission)  Scheduled:   heparin  2,000 Units Intravenous Once    Assessment: 4 yoF with PMH HTN, transient Afib/flutter, AAA, lung cancer on oral chemo, who was recently admitted 7/5 - 7/15 for amputation of ischemic L 5th toe and subsequently sent to rehab, now presenting with SOB and found to be in [place]. Additionally, preliminary BLE doppler positive for L peroneal DVT; CTA ordered to r/o PE.   Baseline INR, aPTT: not done Prior anticoagulation: none, does take ASA +  Plavix PTA  Significant events:  Today, 01/12/2021: CBC: Hgb low but improved from recent admission. Plt noted to be low and significantly lower from last week. SCr borderline elevated, but close to baseline Noted to have a hematoma where IV site was pulled out today, but no further bleeding from this or any other site per RN  Goal of Therapy: Heparin level 0.3-0.7 units/ml Monitor platelets by anticoagulation protocol: Yes  Plan: Discussed platelet trend with MD; did not feel HIT likely given recent experience with heparin during previous admission and subsequent rise in platelets. Also doubt d/t chemotherapy given daily admin (patient also appears to have stopped taking this in the past week). MD did not suspect any consumptive process (DIC, TTP) either.  Heparin 2000 units IV bolus x 1 - small bolus despite low plt given strong suspicion of VTE Heparin 1000 units/hr IV infusion Check heparin level 8 hrs after start Daily CBC, daily heparin level once stable - consider q12 CBC if plt continue to drop with tomorrow AM labs Monitor for signs of bleeding or thrombosis Consider holding/not resuming ASA and/or Plavix if plt remain low while on heparin  Reuel Boom, PharmD, BCPS [phone] 02/02/2021, 4:28 PM

## 2021-01-25 NOTE — ED Provider Notes (Signed)
[name] DE[facility] Provider Note   CSN: [id number] Arrival date & time: 01/13/2021  1330     History Chief Complaint  Patient presents with   Shortness of [name] is a 80 y.o. female.  80 year old female with extensive past medical history including lung cancer, COPD on 2 L of oxygen, CHF, ascending aortic aneurysm, hypertension who p/w shortness of breath.  Nursing facility reported that patient began complaining of shortness of breath around 11:45 AM today.  She was initially placed on 6 L and has been able to titrate back down to 2 L but they felt like she was not stable and they sent her to the ED for further evaluation.  Daughter over the phone states that patient has had a progressive decline over the past month but especially over the past 5 days.  Normally she is alert and conversant and has been delirious and not as responsive.  LEVEL 5 CAVEAT DUE TO AMS  The history is provided by the nursing home. The history is limited by the condition of the patient.  Shortness of Breath     Past Medical History:  Diagnosis Date   Arthritis    reports her joints are fine and she has no pain    Ascending aortic aneurysm ([place]) 07/20/2014   4.1 cm by CT   Atrial fib/flutter, transient    post-op   CHF (congestive heart failure) ([place])    a. EF 30-35% by echo in 2013 --> similar results by repeat imaging in 2016 and 11/2017.   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 ([place]) 09/2011   Stage IB- s/p right upper lobectomy   Pneumonia    Psoriasis    Ankles   Tobacco abuse     Patient Active Problem List   Diagnosis Date Noted   Atherosclerosis of native artery of left lower extremity with gangrene ([place]) 01/11/2021   Adenocarcinoma of left lung, stage 4 ([place]) 12/15/2020   Prolapsed internal hemorrhoids, grade 3 03/25/2020   Grade III  hemorrhoids 11/06/2019   Primary adenocarcinoma of upper lobe of left lung ([place]) 08/07/2017   Malignant neoplasm of upper lobe of left lung ([place]) 08/01/2017   History of adenomatous polyp of colon 02/06/2017   Ascending aortic aneurysm ([place]) 07/20/2014   Non-small cell carcinoma of right lung, stage 1 ([place]) 07/20/2014   Constipation 01/07/2012   Encounter for screening colonoscopy 01/07/2012   Tobacco abuse    Lung nodule 08/21/2011    Past Surgical History:  Procedure Laterality Date   ABDOMINAL AORTOGRAM W/LOWER EXTREMITY N/A 01/12/2021   Procedure: ABDOMINAL AORTOGRAM W/LOWER EXTREMITY;  Surgeon: Marty Heck, MD;  Location: [place] [facility];  Service: Cardiovascular;  Laterality: N/A;   AMPUTATION Left 01/18/2021   Procedure: LEFT FOURTH AND FIFTH TOE AMPUTATION;  Surgeon: Cherre Robins, MD;  Location: [place];  Service: Vascular;  Laterality: Left;   CATARACT EXTRACTION Bilateral 2016   COLONOSCOPY  01/22/2012   Procedure: COLONOSCOPY;  Surgeon: Danie Binder, MD;  [facility];  Service: Endoscopy;  Laterality: N/A;  10:45   COLONOSCOPY N/A 04/15/2017   Four 4-6 mm polyps in descending colon, ascending, and cecum. One 15 mm polyp in proximal ascending colon, one 4 mm poylp at hepatic flexure, two 3-4 mm polyps in sigmoid and descending.  Internal hemorrhoids. Colonoscopy in 3-5 years if benefits outweigh risks. Simple adenomas.    IR RADIOLOGIST EVAL & MGMT  03/04/2018   PERIPHERAL VASCULAR INTERVENTION Left 01/12/2021   Procedure: PERIPHERAL VASCULAR INTERVENTION;  Surgeon: Marty Heck, MD;  Location: [place] [facility];  Service: Cardiovascular;  Laterality: Left;  sfa   POLYPECTOMY  04/15/2017   Procedure: POLYPECTOMY;  Surgeon: Danie Binder, MD;  [facility];  Service: Endoscopy;;  ascending colon   RADIOLOGY WITH ANESTHESIA N/A 03/19/2018   Procedure: CT MICROWAVE THERMAL ABLATION WITH ANESTHESIA;  Surgeon: Jacqulynn Cadet, MD;  Location:  [facility];  Service: Anesthesiology;  Laterality: N/A;   right VATS (upper lobectomy)  08/29/11   Dr Roxan Hockey   VIDEO BRONCHOSCOPY WITH ENDOBRONCHIAL NAVIGATION N/A 03/18/2015   Procedure: VIDEO BRONCHOSCOPY WITH ENDOBRONCHIAL NAVIGATION;  Surgeon: [name], MD;  Location: [place];  Service: Thoracic;  Laterality: N/A;   VIDEO BRONCHOSCOPY WITH ENDOBRONCHIAL NAVIGATION N/A 11/17/2020   Procedure: VIDEO BRONCHOSCOPY WITH ENDOBRONCHIAL NAVIGATION;  Surgeon: [name], MD;  Location: [place];  Service: Thoracic;  Laterality: N/A;     OB History   No obstetric history on file.     Family History  Problem Relation Age of Onset   Hypertension Mother    Heart disease Mother    Heart disease Sister    Heart disease Brother    Colon cancer Neg Hx     Social History   Tobacco Use   Smoking status: Some Days    Packs/day: 0.30    Years: 20.00    Pack years: 6.00    Types: Cigarettes    Last attempt to quit: 01/12/2014    Years since quitting: 7.0   Smokeless tobacco: Never   Tobacco comments:    11/16/20: Per pt, she smokes 2pk per week.  Vaping Use   Vaping Use: Never used  Substance Use Topics   Alcohol use: Yes    Comment: occasional    Drug use: No    Home Medications Prior to Admission medications   Medication Sig Start Date End Date Taking? Authorizing Provider  acetaminophen (TYLENOL) 500 MG tablet Take 500 mg by mouth every 6 (six) hours as needed for mild pain.    [provider]  alendronate (FOSAMAX) 70 MG tablet Take 70 mg by mouth every Monday.  07/06/14   [provider]  aspirin EC 81 MG EC tablet Take 1 tablet (81 mg total) by mouth daily. Swallow whole. 01/15/21   Setzer, Edman Circle, PA-C  atorvastatin (LIPITOR) 40 MG tablet Take 1 tablet (40 mg total) by mouth daily. 01/15/21   Setzer, Edman Circle, PA-C  Calcium Carb-Cholecalciferol (OYSTER SHELL CALCIUM W/D) 500-200 MG-UNIT TABS Take 1 tablet by mouth 3 (three) times daily. 09/22/20    [provider]  cephALEXin (KEFLEX) 500 MG capsule Take 500 mg by mouth 2 (two) times daily. 12/30/20   [provider]  clopidogrel (PLAVIX) 75 MG tablet Take 1 tablet (75 mg total) by mouth daily. 01/20/21   Rhyne, Hulen Shouts, PA-C  glucosamine-chondroitin 500-400 MG tablet Take 1 tablet by mouth daily.    [provider]  hydrochlorothiazide (MICROZIDE) 12.5 MG capsule Take 6.25 mg by mouth daily at 2 PM. 03/05/17   [provider]  HYDROcodone-acetaminophen (NORCO/VICODIN) 5-325 MG tablet Take 1 tablet by mouth every 6 (six) hours as needed. 01/20/21   Rhyne, Hulen Shouts, PA-C  meclizine (ANTIVERT) 12.5 MG tablet Take 12.5 mg by  mouth daily as needed for dizziness.    [provider]  metoprolol succinate (TOPROL XL) 25 MG 24 hr tablet Take 1 tablet (25 mg total) by mouth 2 (two) times daily. Patient taking differently: Take 25 mg by mouth daily. 11/18/17   Herminio Commons, MD  Multiple Vitamins-Iron (MULTIVITAMINS WITH IRON) TABS Take 1 tablet by mouth daily.    [provider]  Omega-3 Fatty Acids (FISH OIL) 500 MG CAPS Take 500 mg by mouth daily.     [provider]  osimertinib mesylate (TAGRISSO) 80 MG tablet Take 1 tablet (80 mg total) by mouth daily. 12/15/20   Curt Bears, MD  potassium chloride SA (KLOR-CON) 20 MEQ tablet Take 1 tablet (20 mEq total) by mouth daily. 01/24/21   Heilingoetter, Cassandra L, PA-C  prochlorperazine (COMPAZINE) 10 MG tablet Take 1 tablet (10 mg total) by mouth every 6 (six) hours as needed. 01/24/21   Heilingoetter, Cassandra L, PA-C  vitamin B-12 (CYANOCOBALAMIN) 500 MCG tablet Take 500 mcg by mouth daily.    [provider]    Allergies    Ibuprofen and Powder  Review of Systems   Review of Systems  Unable to perform ROS: Mental status change  Respiratory:  Positive for shortness of breath.    Physical Exam Updated Vital Signs BP 106/73 (BP Location: Left Arm)   Pulse (!)  113   Temp (!) 97.2 F (36.2 C) (Oral)   Resp (!) 30   Ht 5\' 7"  (1.702 m)   Wt 70 kg   SpO2 100%   BMI 24.17 kg/m   Physical Exam Vitals and nursing note reviewed.  Constitutional:      General: She is not in acute distress.    Appearance: Normal appearance. She is ill-appearing.  HENT:     Head: Normocephalic and atraumatic.  Eyes:     Conjunctiva/sclera: Conjunctivae normal.  Cardiovascular:     Rate and Rhythm: Tachycardia present. Rhythm irregular.     Heart sounds: Normal heart sounds. No murmur heard. Pulmonary:     Comments: Dyspneic with mild tachypnea and diminished breath sounds bilaterally with crackles in bases, no respiratory distress Abdominal:     General: Abdomen is flat. Bowel sounds are normal. There is no distension.     Palpations: Abdomen is soft.     Tenderness: There is no abdominal tenderness.  Musculoskeletal:     Right lower leg: No edema.     Left lower leg: Edema present.     Comments: Mild edema of left lower leg compared to right, no tenderness, warmth, or erythema  Skin:    General: Skin is warm and dry.  Neurological:     Mental Status: She is alert.     Comments: Oriented only to person, difficult to understand speech, sleepy    ED Results / Procedures / Treatments   Labs (all labs ordered are listed, but only abnormal results are displayed) Labs Reviewed  COMPREHENSIVE METABOLIC PANEL - Abnormal; Notable for the following components:      Result Value   Sodium 146 (*)    Glucose, Bld 141 (*)    BUN 48 (*)    Creatinine, Ser 1.11 (*)    Calcium 10.8 (*)    AST 58 (*)    ALT 66 (*)    Alkaline Phosphatase 219 (*)    Total Bilirubin 1.5 (*)    GFR, Estimated 50 (*)    All other components within normal limits  BRAIN NATRIURETIC  PEPTIDE - Abnormal; Notable for the following components:   B Natriuretic Peptide 758.5 (*)    All other components within normal limits  CBC WITH DIFFERENTIAL/PLATELET - Abnormal; Notable for the  following components:   WBC 15.2 (*)    RBC 3.70 (*)    Hemoglobin 11.5 (*)    HCT 35.3 (*)    RDW 16.8 (*)    nRBC 1.1 (*)    All other components within normal limits  BLOOD GAS, VENOUS - Abnormal; Notable for the following components:   pO2, Ven <31.0 (*)    Bicarbonate 30.6 (*)    Acid-Base Excess 5.7 (*)    All other components within normal limits  TROPONIN I (HIGH SENSITIVITY) - Abnormal; Notable for the following components:   Troponin I (High Sensitivity) 177 (*)    All other components within normal limits  RESP PANEL BY RT-PCR (FLU A&B, COVID) [id number]    EKG EKG Interpretation  Date/Time:  Wednesday January 25 2021 14:05:54 EDT Ventricular Rate:  140 PR Interval:    QRS Duration: 119 QT Interval:  336 QTC Calculation: 513 R Axis:   75 Text Interpretation: Atrial fibrillation with rapid V-rate Paired ventricular premature complexes Nonspecific intraventricular conduction delay Nonspecific T abnormalities, diffuse leads A fib new from previous Confirmed by Theotis Burrow [phone]) on 01/06/2021 3:23:42 PM  Radiology DG Chest Portable 1 View  Result Date: 01/16/2021 CLINICAL DATA:  Shortness of breath lung cancer EXAM: PORTABLE CHEST 1 VIEW COMPARISON:  01/11/2021, CT chest 09/30/2020, chest x-ray 01/02/2021 FINDINGS: Cardiomegaly with interim development of small bilateral pleural effusions and diffuse bilateral ground-glass opacity, possible pulmonary edema versus diffuse pneumonia. Left upper lobe lung mass is better seen on CT. Aortic atherosclerosis. IMPRESSION: 1. Cardiomegaly with small bilateral pleural effusions and diffuse bilateral ground-glass opacities consistent with pulmonary edema versus diffuse pneumonia 2. Known left upper lobe lung mass is better seen on CT Electronically Signed   By: Donavan Foil M.D.   On: 01/08/2021 15:16   VAS [place] LOWER EXTREMITY VENOUS (DVT) ([facility] 7a-7p)  Result Date: 01/26/2021  Lower Venous DVT Study Patient Name:  [name]   Date of Exam:   01/06/2021 Medical Rec #: [id number]        Accession #:    [id number] Date of Birth: Apr 20, 1941        Patient Gender: F Patient Age:   79Y Exam Location:  [facility] Procedure:      VAS [place] LOWER EXTREMITY VENOUS (DVT) Referring Phys: [id number] Kariem Wolfson MORGAN Kiara Mcdowell --------------------------------------------------------------------------------  Indications: Pain.  Risk Factors: None identified. Comparison Study: No prior studies. Performing Technologist: Oliver Hum RVT  Examination Guidelines: A complete evaluation includes B-mode imaging, spectral Doppler, color Doppler, and power Doppler as needed of all accessible portions of each vessel. Bilateral testing is considered an integral part of a complete examination. Limited examinations for reoccurring indications may be performed as noted. The reflux portion of the exam is performed with the patient in reverse Trendelenburg.  +-----+---------------+---------+-----------+----------+--------------+ RIGHTCompressibilityPhasicitySpontaneityPropertiesThrombus Aging +-----+---------------+---------+-----------+----------+--------------+ CFV  Full           Yes      Yes                                 +-----+---------------+---------+-----------+----------+--------------+   +---------+---------------+---------+-----------+----------+--------------+ LEFT     CompressibilityPhasicitySpontaneityPropertiesThrombus Aging +---------+---------------+---------+-----------+----------+--------------+ CFV      Full  Yes      Yes                                 +---------+---------------+---------+-----------+----------+--------------+ SFJ      Full                                                        +---------+---------------+---------+-----------+----------+--------------+ FV Prox  Full                                                         +---------+---------------+---------+-----------+----------+--------------+ FV Mid   Full                                                        +---------+---------------+---------+-----------+----------+--------------+ FV DistalFull                                                        +---------+---------------+---------+-----------+----------+--------------+ PFV      Full                                                        +---------+---------------+---------+-----------+----------+--------------+ POP      Full           Yes      Yes                                 +---------+---------------+---------+-----------+----------+--------------+ PTV      Full                                                        +---------+---------------+---------+-----------+----------+--------------+ PERO     Partial                                      Acute          +---------+---------------+---------+-----------+----------+--------------+     Summary: RIGHT: - No evidence of common femoral vein obstruction.  LEFT: - Findings consistent with acute deep vein thrombosis involving the left peroneal veins. - No cystic structure found in the popliteal fossa.  *See table(s) above for measurements and observations.    Preliminary     Procedures .Critical Care  Date/Time: 01/08/2021 4:44 PM Performed by: Sharlett Iles, MD Authorized by: Sharlett Iles, MD   Critical care provider statement:  Critical care time (minutes):  45   Critical care time was exclusive of:  Separately billable procedures and treating other patients   Critical care was necessary to treat or prevent imminent or life-threatening deterioration of the following conditions:  Respiratory failure   Critical care was time spent personally by me on the following activities:  Development of treatment plan with patient or surrogate, evaluation of patient's response to treatment, examination of  patient, obtaining history from patient or surrogate, ordering and performing treatments and interventions, ordering and review of laboratory studies, ordering and review of radiographic studies, re-evaluation of patient's condition and review of old charts   Medications Ordered in ED Medications - No data to display  ED Course  I have reviewed the triage vital signs and the nursing notes.  Pertinent labs & imaging results that were available during my care of the patient were reviewed by me and considered in my medical decision making (see chart for details).    MDM Rules/Calculators/A&P                           Ill-appearing but nontoxic on exam, tachypnea on 2 L but maintaining saturations.  No respiratory distress.  She did appear altered.  EKG shows atrial fibrillation with RVR.  She has a distant history of A. fib but previous cardiology notes state that it was transient and never treated with anticoagulation.  Lab work is notable for creatinine 1.11, mild elevation of LFTs consistent with previous, BNP 758, troponin 177, WBC 15.2, hemoglobin 11.5, VBG reassuring with pH 7.42, PCO2 48.  COVID/influenza negative.  Chest x-ray shows cardiomegaly, bilateral pleural effusions, and pulmonary edema.  Obtained left leg ultrasound which was positive for DVT.  I suspect her elevated troponin is due to demand ischemia from her heart failure.  It is difficult to determine whether heart failure is due primarily to A. fib with RVR or whether it could be due to PE with right heart strain.  I have ordered heparin for now and we will obtain CTA of chest.  We will hold off on Lasix for now until CTA results are available.  Although patient is not hypercarbic, decided to place on BiPAP for better oxygenation and improvement in work of breathing.  Daughter reports over the phone that patient is FULL CODE.   I am signing the patient out to oncoming provider pending CTA and admission. Final Clinical  Impression(s) / ED Diagnoses Final diagnoses:  None    Rx / DC Orders ED Discharge Orders     None        Darwin Rothlisberger, Wenda Overland, MD 01/24/2021 1644

## 2021-01-25 NOTE — H&P (Addendum)
History and Physical    Heather [name] ZSW:[id number] DOB: [birth year] DOA: 01/06/2021  PCP: Rosita Fire, MD  Patient coming from: [facility]  I have personally briefly reviewed patient's old medical records in [place]  Chief Complaint: Respiratory failure  HPI: Heather [name] is a 80 y.o. female with medical history significant of NSCLC (adenocarcinoma, stage 4), prior transient PAF not on AC, HTN, PAD with recent amputation of 2 toes on L foot due to necrosis just last week, Chronic systolic CHF with EF 32-20% on prior echos.  Pt presents to ED with SOB, increased O2 requirement (normally on 2L, was requiring up to 6L at facility).  Nursing facility reported that patient began complaining of shortness of breath around 11:45 AM today.  She was initially placed on 6 L and has been able to titrate back down to 2 L but they felt like she was not stable and they sent her to the ED for further evaluation.  Daughter over the phone states that patient has had a progressive decline over the past month but especially over the past 5 days.  Normally she is alert and conversant and has been delirious and not as responsive.   ED Course: In the ED, pt found to have increased O2 requirement, lethargy.  ABG didn't show any CO2 retention nor acidosis, but due to fact pt was breathing at rate of 35-40, pt placed on BIPAP.  Noted to have increased LLE swelling, found to have LLE peroneal DVT on [place].  Heparin gtt started.  WBC 15.2k, trop 177, was 165 on repeat, BNP 758.5.  Creat 1.1, BUN 48.  Platelets 73.  COVID and flu are neg.  CTA chest: 1) Lung CA has enlarged since March (now 4.1 x 1.9 cm, previously was 1.6 x 1.4 cm). 2) interval increase in diffuse patchy ground glass opacity: infection vs inflamation vs adenocarcinoma. 3) Trace B pleural effusions 4) enlarged pulm artery suggestive of PAH 5) no PE 6) incompletely imaged AAA of at least 3.6 cm.  Pt noted to be  in A.Fib with RVR rates of up to 150.  Pt given 5mg  IV metoprolol with improvement in HR to 120s.  Pt now awake and talking, BIPAP DCd due to episode of nausea just a few mins ago.  Daughter is at bedside.  Pt currently tolerating [place].  Pt denies CP, abd pain, hip pain, leg pain.  Mental status seems improved for the moment, as pt now awake and having conversation with daughter at bedside.   Review of Systems: As per HPI, otherwise all review of systems negative.  Past Medical History:  Diagnosis Date   Arthritis    reports her joints are fine and she has no pain    Ascending aortic aneurysm ([place]) 07/20/2014   4.1 cm by CT   Atrial fib/flutter, transient    post-op   CHF (congestive heart failure) ([place])    a. EF 30-35% by echo in 2013 --> similar results by repeat imaging in 2016 and 11/2017.   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 ([place] 09/2011   Stage IB- s/p right upper lobectomy   Pneumonia    Psoriasis    Ankles   Tobacco abuse     Past Surgical History:  Procedure Laterality Date   ABDOMINAL AORTOGRAM W/LOWER EXTREMITY N/A 01/12/2021   Procedure:  ABDOMINAL AORTOGRAM W/LOWER EXTREMITY;  Surgeon: Marty Heck, MD;  Location: [facility];  Service: Cardiovascular;  Laterality: N/A;   AMPUTATION Left 01/18/2021   Procedure: LEFT FOURTH AND FIFTH TOE AMPUTATION;  Surgeon: Cherre Robins, MD;  Location: [facility];  Service: Vascular;  Laterality: Left;   CATARACT EXTRACTION Bilateral 2016   COLONOSCOPY  01/22/2012   Procedure: COLONOSCOPY;  Surgeon: Danie Binder, MD;  [facility];  Service: Endoscopy;  Laterality: N/A;  10:45   COLONOSCOPY N/A 04/15/2017   Four 4-6 mm polyps in descending colon, ascending, and cecum. One 15 mm polyp in proximal ascending colon, one 4 mm poylp at hepatic flexure, two 3-4 mm polyps in sigmoid and descending. Internal  hemorrhoids. Colonoscopy in 3-5 years if benefits outweigh risks. Simple adenomas.    IR RADIOLOGIST EVAL & MGMT  03/04/2018   PERIPHERAL VASCULAR INTERVENTION Left 01/12/2021   Procedure: PERIPHERAL VASCULAR INTERVENTION;  Surgeon: Marty Heck, MD;  Location: [facility];  Service: Cardiovascular;  Laterality: Left;  sfa   POLYPECTOMY  04/15/2017   Procedure: POLYPECTOMY;  Surgeon: Danie Binder, MD;  [facility];  Service: Endoscopy;;  ascending colon   RADIOLOGY WITH ANESTHESIA N/A 03/19/2018   Procedure: CT MICROWAVE THERMAL ABLATION WITH ANESTHESIA;  Surgeon: Jacqulynn Cadet, MD;  Location: [facility];  Service: Anesthesiology;  Laterality: N/A;   right VATS (upper lobectomy)  08/29/11   Dr Roxan Hockey   VIDEO BRONCHOSCOPY WITH ENDOBRONCHIAL NAVIGATION N/A 03/18/2015   Procedure: VIDEO BRONCHOSCOPY WITH ENDOBRONCHIAL NAVIGATION;  Surgeon: [name], MD;  Location: [facility];  Service: Thoracic;  Laterality: N/A;   VIDEO BRONCHOSCOPY WITH ENDOBRONCHIAL NAVIGATION N/A 11/17/2020   Procedure: VIDEO BRONCHOSCOPY WITH ENDOBRONCHIAL NAVIGATION;  Surgeon: [name], MD;  Location: [facility];  Service: Thoracic;  Laterality: N/A;     reports that she has been smoking cigarettes. She has a 6.00 pack-year smoking history. She has never used smokeless tobacco. She reports current alcohol use. She reports that she does not use drugs.  Allergies  Allergen Reactions   Ibuprofen Nausea And Vomiting and Other (See Comments)    "Allergic," per MAR   Powder Rash and Other (See Comments)    NO gloves with powder- "Allergic," per [facility]    Family History  Problem Relation Age of Onset   Hypertension Mother    Heart disease Mother    Heart disease Sister    Heart disease Brother    Colon cancer Neg Hx      Prior to Admission medications   Medication Sig Start Date End Date Taking? Authorizing Provider  acetaminophen (TYLENOL) 500 MG tablet Take 500 mg by mouth  every 6 (six) hours as needed for mild pain.   Yes [provider]  alendronate (FOSAMAX) 70 MG tablet Take 70 mg by mouth every Monday.  07/06/14  Yes [provider]  aspirin EC 81 MG EC tablet Take 1 tablet (81 mg total) by mouth daily. Swallow whole. 01/15/21  Yes Setzer, Edman Circle, PA-C  atorvastatin (LIPITOR) 40 MG tablet Take 1 tablet (40 mg total) by mouth daily. Patient taking differently: Take 40 mg by mouth at bedtime. 01/15/21  Yes Setzer, Edman Circle, PA-C  clopidogrel (PLAVIX) 75 MG tablet Take 1 tablet (75 mg total) by mouth daily. 01/20/21  Yes Rhyne, Hulen Shouts, PA-C  glucosamine-chondroitin 500-400 MG tablet Take 1 tablet by mouth daily.   Yes [provider]  hydrochlorothiazide (HYDRODIURIL) 12.5  MG tablet Take 6.25 mg by mouth daily.   Yes [provider]  HYDROcodone-acetaminophen (NORCO/VICODIN) 5-325 MG tablet Take 1 tablet by mouth every 6 (six) hours as needed. Patient taking differently: Take 1 tablet by mouth every 6 (six) hours as needed (for pain). 01/20/21  Yes Rhyne, Hulen Shouts, PA-C  meclizine (ANTIVERT) 12.5 MG tablet Take 12.5 mg by mouth daily as needed for dizziness.   Yes [provider]  metoprolol succinate (TOPROL-XL) 50 MG 24 hr tablet Take 25 mg by mouth in the morning. Take with or immediately following a meal.   Yes [provider]  Multiple Vitamins-Iron (MULTIVITAMINS WITH IRON) TABS Take 1 tablet by mouth daily.   Yes [provider]  Omega-3 Fatty Acids (FISH OIL) 500 MG CAPS Take 500 mg by mouth daily.    Yes [provider]  osimertinib mesylate (TAGRISSO) 80 MG tablet Take 1 tablet (80 mg total) by mouth daily. 12/15/20  Yes Curt Bears, MD  OXYGEN Inhale 2 L/min into the lungs See admin instructions. "2 L/min every day and night shift to keep O2 sats ABOVE 90%"   Yes [provider]  OYSTER SHELL CALCIUM + D 500-200 MG-UNIT TABS Take 1 tablet by mouth 3 (three) times daily.    Yes [provider]  pantoprazole (PROTONIX) 20 MG tablet Take 20 mg by mouth daily before breakfast.   Yes [provider]  potassium chloride SA (KLOR-CON) 20 MEQ tablet Take 1 tablet (20 mEq total) by mouth daily. 01/24/21  Yes Heilingoetter, Cassandra L, PA-C  prochlorperazine (COMPAZINE) 10 MG tablet Take 1 tablet (10 mg total) by mouth every 6 (six) hours as needed. Patient taking differently: Take 10 mg by mouth every 6 (six) hours as needed for nausea or vomiting. 01/24/21  Yes Heilingoetter, Cassandra L, PA-C  vitamin B-12 (CYANOCOBALAMIN) 500 MCG tablet Take 500 mcg by mouth daily.   Yes [provider]  metoprolol succinate (TOPROL XL) 25 MG 24 hr tablet Take 1 tablet (25 mg total) by mouth 2 (two) times daily. Patient not taking: No sig reported 11/18/17   Herminio Commons, MD    Physical Exam: Vitals:   01/08/2021 1834 01/10/2021 1900 01/31/2021 1931 01/19/2021 1933  BP: 118/90 (!) 113/101 (!) 123/93 (!) 123/93  Pulse: (!) 118  (!) 118 71  Resp: 20 (!) 35 (!) 31 (!) 21  Temp:      TempSrc:      SpO2: 100%  98% 97%  Weight:      Height:        Constitutional: NAD, calm, comfortable Eyes: PERRL, lids and conjunctivae normal ENMT: Mucous membranes are moist. Posterior pharynx clear of any exudate or lesions.Normal dentition.  Neck: normal, supple, no masses, no thyromegaly Respiratory: Tachypnic, some accessory muscle use after being taken off of BIPAP, has bibasilar crackles. Cardiovascular: IRR, IRR, tachycardic. no murmurs / rubs / gallops. 1+ LLE Edema, trace RLE edema. Abdomen: no tenderness, no masses palpated. No hepatosplenomegaly. Bowel sounds positive.  Musculoskeletal: no clubbing / cyanosis. No joint deformity upper and lower extremities. Good ROM, no contractures. Normal muscle tone.  Skin: Stage 1 small sacral decubitus, doesn't look grossly infected or anything.  Bruising of R hip, but no TTP, pain with movement of leg, etc. Neurologic:  CN 2-12 grossly intact. Sensation intact, DTR normal. Strength 5/5 in all 4.  Psychiatric: Normal judgment and insight. Alert and oriented x 3. Normal mood.    Labs on Admission: I have personally  reviewed following labs and imaging studies  CBC: Recent Labs  Lab 01/24/21 0958 01/18/2021 1406  WBC 15.8* 15.2*  NEUTROABS 12.1* 10.7*  HGB 10.5* 11.5*  HCT 30.5* 35.3*  MCV 91.3 95.4  PLT 98* 73*   Basic Metabolic Panel: Recent Labs  Lab 01/19/21 0153 01/24/21 0958 01/17/2021 1406  NA 138 146* 146*  K 4.7 3.1* 3.5  CL 102 103 103  CO2 23 28 29   GLUCOSE 140* 147* 141*  BUN 15 34* 48*  CREATININE 0.95 0.95 1.11*  CALCIUM 9.0 10.1 10.8*   GFR: Estimated Creatinine Clearance: 39.3 mL/min (A) (by C-G formula based on SCr of 1.11 mg/dL (H)). Liver Function Tests: Recent Labs  Lab 01/24/21 0958 01/11/2021 1406  AST 55* 58*  ALT 58* 66*  ALKPHOS 202* 219*  BILITOT 1.3* 1.5*  PROT 6.5 7.2  ALBUMIN 3.2* 3.8   No results for input(s): LIPASE, AMYLASE in the last 168 hours. No results for input(s): AMMONIA in the last 168 hours. Coagulation Profile: No results for input(s): INR, PROTIME in the last 168 hours. Cardiac Enzymes: No results for input(s): CKTOTAL, CKMB, CKMBINDEX, TROPONINI in the last 168 hours. BNP (last 3 results) No results for input(s): PROBNP in the last 8760 hours. HbA1C: No results for input(s): HGBA1C in the last 72 hours. CBG: No results for input(s): GLUCAP in the last 168 hours. Lipid Profile: No results for input(s): CHOL, HDL, LDLCALC, TRIG, CHOLHDL, LDLDIRECT in the last 72 hours. Thyroid Function Tests: No results for input(s): TSH, T4TOTAL, FREET4, T3FREE, THYROIDAB in the last 72 hours. Anemia Panel: No results for input(s): VITAMINB12, FOLATE, FERRITIN, TIBC, IRON, RETICCTPCT in the last 72 hours. Urine analysis:    Component Value Date/Time   COLORURINE YELLOW 09/20/2011 0951   APPEARANCEUR CLEAR 09/20/2011 0951   LABSPEC 1.014  09/20/2011 0951   PHURINE 6.5 09/20/2011 0951   GLUCOSEU NEGATIVE 09/20/2011 0951   HGBUR SMALL (A) 09/20/2011 0951   BILIRUBINUR NEGATIVE 09/20/2011 0951   KETONESUR NEGATIVE 09/20/2011 0951   PROTEINUR NEGATIVE 09/20/2011 0951   UROBILINOGEN 1.0 09/20/2011 0951   NITRITE NEGATIVE 09/20/2011 0951   LEUKOCYTESUR SMALL (A) 09/20/2011 0951    Radiological Exams on Admission: CT Angio Chest PE W/Cm &/Or Wo Cm  Result Date: 01/12/2021 CLINICAL DATA:  PE suspected, high prob. Pt has a hx of lung cancer. Pt c/o shortness of breath today at 1145. Pt was placed on 6L and was titrated down to 2L. Recent toe surgery EXAM: CT ANGIOGRAPHY CHEST WITH CONTRAST TECHNIQUE: Multidetector CT imaging of the chest was performed using the standard protocol during bolus administration of intravenous contrast. Multiplanar CT image reconstructions and MIPs were obtained to evaluate the vascular anatomy. CONTRAST:  2mL OMNIPAQUE IOHEXOL 350 MG/ML SOLN COMPARISON:  CT chest 09/30/2020 FINDINGS: Cardiovascular: Satisfactory opacification of the pulmonary arteries to the segmental level. No evidence of pulmonary embolism. Enlarged main pulmonary artery. Cardiomegaly. No pericardial effusion. The thoracic aorta is in caliber. Atherosclerotic plaque is noted. Artery calcifications noted. Mediastinum/Nodes: Limited evaluation for lymphadenopathy due to timing of contrast. No enlarged mediastinal, hilar, or axillary lymph nodes. Thyroid gland, trachea, and esophagus demonstrate no significant findings. Lungs/Pleura: Interval increase in size of a difficult to measure 4.1 x 1.9 x 4.3 Cm (from 1.6 x 1.4 cm) left apical spiculated pulmonary mass. Less conspicuous 1 1 cm nodular like density within lower lobe (9:58). Interval increase in diffuse patchy ground-glass airspace opacities that are more prominent within the lower lung zones. Bilateral trace volume pleural  effusions. No pneumothorax. Upper Abdomen: Poor visualization of a  suprarenal abdominal aorta aneurysm measuring up to 3.2 cm on axial imaging (7:35). Poor visualization of abdominal aorta aneurysm to 3.6 cm axial imaging (7:50). Partially visualized fluid density lesion kidney. No acute abnormality. No acute abnormality. Musculoskeletal: No chest wall abnormality. No suspicious lytic or blastic osseous lesions. No acute displaced fracture. Multilevel degenerative changes of the spine. Review of the MIP images confirms the above findings. IMPRESSION: 1. No pulmonary embolus. 2. Interval increase in size of a difficult to measure 4.1 x 1.9 cm (from 1.6 x 1.4 cm) left apical spiculated pulmonary mass consistent with lung cancer. 3. Interval increase in diffuse patchy ground-glass airspace opacities. Differential diagnosis includes infection versus inflammation versus adenocarcinoma. Additional imaging evaluation or consultation with Pulmonology or Thoracic Surgery recommended. 4. Bilateral trace effusions. 5. Enlarged main pulmonary artery suggestive of pulmonary hypertension. 6. Cardiomegaly. 7. Poorly visualized/incompletely evaluated abdominal aorta aneurysm measuring up to at least 3.6 cm. Recommend follow-up ultrasound every 2 years. This recommendation follows ACR consensus guidelines: White Paper of the ACR Incidental Findings Committee II on Vascular Findings. J Am Coll Radiol 2013; 10:789-794. 8.  Aortic Atherosclerosis (ICD10-I70.0). Electronically Signed   By: Iven Finn M.D.   On: 01/15/2021 19:00   DG Chest Portable 1 View  Result Date: 01/14/2021 CLINICAL DATA:  Shortness of breath lung cancer EXAM: PORTABLE CHEST 1 VIEW COMPARISON:  01/11/2021, CT chest 09/30/2020, chest x-ray 01/02/2021 FINDINGS: Cardiomegaly with interim development of small bilateral pleural effusions and diffuse bilateral ground-glass opacity, possible pulmonary edema versus diffuse pneumonia. Left upper lobe lung mass is better seen on CT. Aortic atherosclerosis. IMPRESSION: 1.  Cardiomegaly with small bilateral pleural effusions and diffuse bilateral ground-glass opacities consistent with pulmonary edema versus diffuse pneumonia 2. Known left upper lobe lung mass is better seen on CT Electronically Signed   By: Donavan Foil M.D.   On: 01/12/2021 15:16   VAS [place] LOWER EXTREMITY VENOUS (DVT) ([facility] and [facility] 7a-7p)  Result Date: 01/18/2021  Lower Venous DVT Study Patient Name:  MALEEA CAMILO  Date of Exam:   01/09/2021 Medical Rec #: [id number]        Accession #:    [id number] Date of Birth: 06-01-1941        Patient Gender: F Patient Age:   79Y Exam [facility]:  [facility] Procedure:      VAS [place] LOWER EXTREMITY VENOUS (DVT) Referring Phys: [id number] RACHEL MORGAN LITTLE --------------------------------------------------------------------------------  Indications: Pain.  Risk Factors: None identified. Comparison Study: No prior studies. Performing Technologist: Oliver Hum RVT  Examination Guidelines: A complete evaluation includes B-mode imaging, spectral Doppler, color Doppler, and power Doppler as needed of all accessible portions of each vessel. Bilateral testing is considered an integral part of a complete examination. Limited examinations for reoccurring indications may be performed as noted. The reflux portion of the exam is performed with the patient in reverse Trendelenburg.  +-----+---------------+---------+-----------+----------+--------------+ RIGHTCompressibilityPhasicitySpontaneityPropertiesThrombus Aging +-----+---------------+---------+-----------+----------+--------------+ CFV  Full           Yes      Yes                                 +-----+---------------+---------+-----------+----------+--------------+   +---------+---------------+---------+-----------+----------+--------------+ LEFT     CompressibilityPhasicitySpontaneityPropertiesThrombus Aging +---------+---------------+---------+-----------+----------+--------------+ CFV      Full            Yes      Yes                                 +---------+---------------+---------+-----------+----------+--------------+  SFJ      Full                                                        +---------+---------------+---------+-----------+----------+--------------+ FV Prox  Full                                                        +---------+---------------+---------+-----------+----------+--------------+ FV Mid   Full                                                        +---------+---------------+---------+-----------+----------+--------------+ FV DistalFull                                                        +---------+---------------+---------+-----------+----------+--------------+ PFV      Full                                                        +---------+---------------+---------+-----------+----------+--------------+ POP      Full           Yes      Yes                                 +---------+---------------+---------+-----------+----------+--------------+ PTV      Full                                                        +---------+---------------+---------+-----------+----------+--------------+ PERO     Partial                                      Acute          +---------+---------------+---------+-----------+----------+--------------+     Summary: RIGHT: - No evidence of common femoral vein obstruction.  LEFT: - Findings consistent with acute deep vein thrombosis involving the left peroneal veins. - No cystic structure found in the popliteal fossa.  *See table(s) above for measurements and observations.    Preliminary     EKG: Independently reviewed.  Assessment/Plan Principal Problem:   Acute on chronic respiratory failure with hypoxia (HCC) Active Problems:   Adenocarcinoma of left lung, stage 4 (HCC)   Atherosclerosis of native artery of left lower extremity with gangrene (HCC)   Atrial fibrillation with RVR  (HCC)   Left leg DVT (HCC)   Decubitus ulcer of sacral region, stage 1   Acute  on chronic systolic CHF (congestive heart failure) ([place])   CAP (community acquired pneumonia)    Acute on chronic resp failure with hypoxia - Likely due to a combination of factors: Worsening NSCLC Some acute CHF component due to new onset A.Fib RVR Suspected CAP Possible PAH Could also be Tagrisso pulm toxicity Improved on rescue BIPAP, but then rescue BIPAP had to be DCd due to onset of nausea Will see how she does on [place] High risk for requiring intubation (as discussed with daughter). Suspected CAP - PNA pathway Empiric rocephin + azithromycin Check MRSA PCR nares (h/o + in past, but last few have been neg)  So no vanc a this point. Covid and flu are neg A.Fib RVR - Likely causing the acute on chronic systolic CHF and increased trop from demand ischemia. Metoprolol IV Cardizem gtt if needed Already on heparin gtt (for DVT) 2d echo ordered Acute on chronic systolic CHF - Think this primarily due to A.Fib RVR Given lack of edema, pre-renal appearance of kidneys, report by daughter of poor PO intake: want to hold off on lasix for the moment. Strict intake and output Get 2d echo NSCLC - Worsening Holding tagrisso for the moment (until pulm / cardiac tox can be ruled out) Putting order for onc consult in AM into chart L leg DVT - No PE Heparin gtt Thrombocytopenia - Mild thrombocytopenia with platelets of 73 Has DVT despite this Possibly due to consumption of platelets (by DVT in LLE) DIC pnl pending Starting heparin gtt despite this due to confirmed DVT Holding home anti-platelet agents for the moment due to thrombocytopenia + heparin gtt. LLE gangrene of toes - S/p amputation of 4th and 5th toe last week. Wound dressing C/D/I Wound care consult Duskiness of 2nd and 3rd toes today No gross infection HTN - Cont metoprolol PO Hold HCTZ  DVT prophylaxis: Heparin gtt (has DVT in  LLE). Code Status: Full code - for now, per patient daughter Family Communication: Daughter at bedside Disposition Plan: SNF after admit Consults called: None, Consult put into Epic so she shows up on Dr. Lew Dawes list Admission status: Admit to inpatient  Severity of Illness: The appropriate patient status for this patient is INPATIENT. Inpatient status is judged to be reasonable and necessary in order to provide the required intensity of service to ensure the patient's safety. The patient's presenting symptoms, physical exam findings, and initial radiographic and laboratory data in the context of their chronic comorbidities is felt to place them at high risk for further clinical deterioration. Furthermore, it is not anticipated that the patient will be medically stable for discharge from the hospital within 2 midnights of admission. The following factors support the patient status of inpatient.   IP status due to acute on chronic resp failure with hypoxia: Pt normally requiring 2L via [place], now requiring BIPAP with increased RR, increased O2 requirement.  * I certify that at the point of admission it is my clinical judgment that the patient will require inpatient hospital care spanning beyond 2 midnights from the point of admission due to high intensity of service, high risk for further deterioration and high frequency of surveillance required.*   Takeo Harts M. DO [facility]  How to contact the [facility] Attending or Consulting provider [place] or covering provider during after hours [place], for this patient?  Check the care team in [facility] and look for a) attending/consulting [facility] provider listed and b) the [facility] team listed Log into [email or web address]  Amion  Physician Scheduling and messaging for groups and whole hospitals  On call and physician scheduling software for group practices, residents, hospitalists and other medical providers for call, clinic, rotation and shift schedules. OnCall  [employer] is a hospital-wide system for scheduling doctors and paging doctors on call. EasyPlot is for scientific plotting and data analysis.  [email or web address]  and use [facility]'s universal password to access. If you do not have the password, please contact the hospital operator.  Locate the [facility] provider you are looking for under [facility] and page to a number that you can be directly reached. If you still have difficulty reaching the provider, please page the [facility] (Director on Call) for the Hospitalists listed on amion for assistance.  01/26/2021, 8:47 PM

## 2021-01-25 NOTE — ED Provider Notes (Signed)
Signout from Dr. Rex Kras.  80 year old female history of lung cancer COPD on oxygen CHF sent in from her facility for worsening shortness of breath.  She is in A. fib with RVR.  Also clinically signs of heart failure.  New DVT.  On heparin.  Currently on BiPAP.  Plan is for CT angio chest to eval for PE.  She is full code.  She may need rate control and diuresis if does not have significant PE.  She may need ICU significant PE. Physical Exam  BP (!) 122/95   Pulse 67   Temp (!) 97.2 F (36.2 C) (Oral)   Resp (!) 23   Ht 5\' 7"  (1.702 m)   Wt 70 kg   SpO2 98%   BMI 24.17 kg/m   Physical Exam  ED Course/Procedures     Procedures  MDM  CT not showing evidence of acute PE.  We will proceed with rate control.  Will page hospitalist regarding admission.  Discussed with Dr. Alcario Drought [facility] hospitalist to evaluate the patient for admission.       Hayden Rasmussen, MD 01/26/21 1019

## 2021-01-25 NOTE — Progress Notes (Signed)
Left lower extremity venous duplex has been completed. Preliminary results can be found in CV Proc through chart review.  Results were given to Dr. Rex Kras.  02/01/2021 2:18 PM Carlos Levering RVT

## 2021-01-25 NOTE — ED Notes (Signed)
Patient returned from CT

## 2021-01-25 NOTE — ED Notes (Addendum)
RT paged to initiate BiPAP

## 2021-01-25 NOTE — Telephone Encounter (Signed)
Patient's daughter calls today to report that patient is having some blood in the mucous that she coughs up. Oncology thinks it may be due to the Plavix. Discussed with Dr. Stanford Breed, patient had stent placed on 01/12/21. Would be optimal if patient could remain on Plavix for 3 months. Discussed with daughter about the action of plavix and potential of stent thrombosis. Blood was drawn by oncology yesterday, and currently patient's Hgb is 10.5. Advised her if patient could tolerate the Plavix, stay on it. If patient started coughing up a lot of blood or bleeding from other areas to let us know. Verbalized understanding.

## 2021-01-25 NOTE — ED Notes (Addendum)
RT at bedside.

## 2021-01-25 NOTE — ED Triage Notes (Signed)
Pt coming from [facility]. Pt has a hx of lung cancer. Pt c/o shortness of breath today at 1145. Pt was placed on 6L and was titrated down to 2L. Facility states that they could not keep her stable and that she needed to come here.  116/84 68-HR 32-RR  90-95% on 2L

## 2021-01-26 ENCOUNTER — Encounter (HOSPITAL_COMMUNITY): Payer: Self-pay | Admitting: Internal Medicine

## 2021-01-26 ENCOUNTER — Inpatient Hospital Stay (HOSPITAL_COMMUNITY): Payer: Medicare Other

## 2021-01-26 DIAGNOSIS — J9621 Acute and chronic respiratory failure with hypoxia: Secondary | ICD-10-CM

## 2021-01-26 DIAGNOSIS — I5021 Acute systolic (congestive) heart failure: Secondary | ICD-10-CM

## 2021-01-26 DIAGNOSIS — I509 Heart failure, unspecified: Secondary | ICD-10-CM

## 2021-01-26 DIAGNOSIS — I4891 Unspecified atrial fibrillation: Secondary | ICD-10-CM | POA: Diagnosis not present

## 2021-01-26 DIAGNOSIS — D65 Disseminated intravascular coagulation [defibrination syndrome]: Secondary | ICD-10-CM

## 2021-01-26 DIAGNOSIS — I5023 Acute on chronic systolic (congestive) heart failure: Secondary | ICD-10-CM | POA: Diagnosis not present

## 2021-01-26 DIAGNOSIS — E876 Hypokalemia: Secondary | ICD-10-CM

## 2021-01-26 DIAGNOSIS — E440 Moderate protein-calorie malnutrition: Secondary | ICD-10-CM | POA: Insufficient documentation

## 2021-01-26 DIAGNOSIS — I82452 Acute embolism and thrombosis of left peroneal vein: Secondary | ICD-10-CM | POA: Diagnosis not present

## 2021-01-26 DIAGNOSIS — C3492 Malignant neoplasm of unspecified part of left bronchus or lung: Secondary | ICD-10-CM

## 2021-01-26 LAB — CBC
HCT: 32.5 % — ABNORMAL LOW (ref 36.0–46.0)
Hemoglobin: 10.7 g/dL — ABNORMAL LOW (ref 12.0–15.0)
MCH: 31.4 pg (ref 26.0–34.0)
MCHC: 32.9 g/dL (ref 30.0–36.0)
MCV: 95.3 fL (ref 80.0–100.0)
Platelets: 72 10*3/uL — ABNORMAL LOW (ref 150–400)
RBC: 3.41 MIL/uL — ABNORMAL LOW (ref 3.87–5.11)
RDW: 17.1 % — ABNORMAL HIGH (ref 11.5–15.5)
WBC: 17.9 10*3/uL — ABNORMAL HIGH (ref 4.0–10.5)
nRBC: 2.2 % — ABNORMAL HIGH (ref 0.0–0.2)

## 2021-01-26 LAB — COMPREHENSIVE METABOLIC PANEL
ALT: 48 U/L — ABNORMAL HIGH (ref 0–44)
AST: 52 U/L — ABNORMAL HIGH (ref 15–41)
Albumin: 3 g/dL — ABNORMAL LOW (ref 3.5–5.0)
Alkaline Phosphatase: 156 U/L — ABNORMAL HIGH (ref 38–126)
Anion gap: 10 (ref 5–15)
BUN: 53 mg/dL — ABNORMAL HIGH (ref 8–23)
CO2: 30 mmol/L (ref 22–32)
Calcium: 9.6 mg/dL (ref 8.9–10.3)
Chloride: 107 mmol/L (ref 98–111)
Creatinine, Ser: 1.35 mg/dL — ABNORMAL HIGH (ref 0.44–1.00)
GFR, Estimated: 40 mL/min — ABNORMAL LOW (ref >60)
Glucose, Bld: 99 mg/dL (ref 70–99)
Potassium: 3.5 mmol/L (ref 3.5–5.1)
Sodium: 147 mmol/L — ABNORMAL HIGH (ref 135–145)
Total Bilirubin: 0.9 mg/dL (ref 0.3–1.2)
Total Protein: 5.6 g/dL — ABNORMAL LOW (ref 6.5–8.1)

## 2021-01-26 LAB — ECHOCARDIOGRAM COMPLETE
Area-P 1/2: 2.17 cm2
Height: 67 in
S' Lateral: 5 cm
Single Plane A4C EF: 17.7 %
Weight: 2469.15 oz

## 2021-01-26 LAB — SAVE SMEAR(SSMR), FOR PROVIDER SLIDE REVIEW

## 2021-01-26 LAB — LACTATE DEHYDROGENASE: LDH: 433 U/L — ABNORMAL HIGH (ref 98–192)

## 2021-01-26 LAB — PROTIME-INR
INR: 2 — ABNORMAL HIGH (ref 0.8–1.2)
Prothrombin Time: 22.7 seconds — ABNORMAL HIGH (ref 11.4–15.2)

## 2021-01-26 LAB — HEPARIN LEVEL (UNFRACTIONATED)
Heparin Unfractionated: 0.42 IU/mL (ref 0.30–0.70)
Heparin Unfractionated: 0.49 IU/mL (ref 0.30–0.70)

## 2021-01-26 LAB — BASIC METABOLIC PANEL
Anion gap: 12 (ref 5–15)
BUN: 49 mg/dL — ABNORMAL HIGH (ref 8–23)
CO2: 29 mmol/L (ref 22–32)
Calcium: 9.9 mg/dL (ref 8.9–10.3)
Chloride: 105 mmol/L (ref 98–111)
Creatinine, Ser: 1.24 mg/dL — ABNORMAL HIGH (ref 0.44–1.00)
GFR, Estimated: 44 mL/min — ABNORMAL LOW (ref >60)
Glucose, Bld: 118 mg/dL — ABNORMAL HIGH (ref 70–99)
Potassium: 3.3 mmol/L — ABNORMAL LOW (ref 3.5–5.1)
Sodium: 146 mmol/L — ABNORMAL HIGH (ref 135–145)

## 2021-01-26 LAB — HIV ANTIBODY (ROUTINE TESTING W REFLEX): HIV Screen 4th Generation wRfx: NONREACTIVE

## 2021-01-26 LAB — LACTIC ACID, PLASMA: Lactic Acid, Venous: 2.7 mmol/L (ref 0.5–1.9)

## 2021-01-26 LAB — PROCALCITONIN: Procalcitonin: <0.1 ng/mL

## 2021-01-26 LAB — APTT: aPTT: 172 seconds (ref 24–36)

## 2021-01-26 MED ORDER — ORAL CARE MOUTH RINSE
15.0000 mL | Freq: Two times a day (BID) | OROMUCOSAL | Status: DC
  Administered 2021-01-26 – 2021-01-29 (×8): 15 mL via OROMUCOSAL

## 2021-01-26 MED ORDER — PANTOPRAZOLE SODIUM 40 MG IV SOLR
40.0000 mg | INTRAVENOUS | Status: DC
  Administered 2021-01-26 – 2021-01-29 (×4): 40 mg via INTRAVENOUS
  Filled 2021-01-26 (×4): qty 40

## 2021-01-26 MED ORDER — ACETAMINOPHEN 650 MG RE SUPP
650.0000 mg | Freq: Four times a day (QID) | RECTAL | Status: DC | PRN
  Administered 2021-01-26 (×2): 650 mg via RECTAL
  Filled 2021-01-26 (×2): qty 1

## 2021-01-26 MED ORDER — METOPROLOL TARTRATE 25 MG/10 ML ORAL SUSPENSION
12.5000 mg | Freq: Two times a day (BID) | ORAL | Status: DC
  Administered 2021-01-26 – 2021-01-28 (×6): 12.5 mg via ORAL
  Filled 2021-01-26 (×7): qty 5

## 2021-01-26 MED ORDER — POTASSIUM CHLORIDE CRYS ER 20 MEQ PO TBCR: 40.0000 meq | EXTENDED_RELEASE_TABLET | Freq: Once | ORAL | Status: DC

## 2021-01-26 MED ORDER — POTASSIUM CHLORIDE 10 MEQ/100ML IV SOLN: 10.0000 meq | INTRAVENOUS | Status: DC

## 2021-01-26 MED ORDER — CHLORHEXIDINE GLUCONATE CLOTH 2 % EX PADS
6.0000 | MEDICATED_PAD | Freq: Every day | CUTANEOUS | Status: DC
  Administered 2021-01-26 – 2021-01-29 (×5): 6 via TOPICAL

## 2021-01-26 MED ORDER — POTASSIUM CHLORIDE 10 MEQ/100ML IV SOLN
10.0000 meq | INTRAVENOUS | Status: AC
  Administered 2021-01-26 (×3): 10 meq via INTRAVENOUS
  Filled 2021-01-26 (×3): qty 100

## 2021-01-26 MED ORDER — METHYLPREDNISOLONE SODIUM SUCC 40 MG IJ SOLR
40.0000 mg | Freq: Two times a day (BID) | INTRAMUSCULAR | Status: DC
  Administered 2021-01-26 – 2021-01-29 (×7): 40 mg via INTRAVENOUS
  Filled 2021-01-26 (×7): qty 1

## 2021-01-26 MED ORDER — CYANOCOBALAMIN 1000 MCG/ML IJ SOLN
1000.0000 ug | Freq: Every day | INTRAMUSCULAR | Status: DC
  Administered 2021-01-26 – 2021-01-29 (×4): 1000 ug via SUBCUTANEOUS
  Filled 2021-01-26 (×4): qty 1

## 2021-01-26 NOTE — Progress Notes (Signed)
[facility] for IV heparin Indication: New Afib/RVR, + DVT  Allergies  Allergen Reactions   Ibuprofen Nausea And Vomiting and Other (See Comments)    "Allergic," per [facility]   Powder Rash and Other (See Comments)    NO gloves with powder- "Allergic," per [facility]    Patient Measurements: Height: 5\' 7"  (170.2 cm) Weight: 70 kg (154 lb 5.2 oz) IBW/kg (Calculated) : 61.6 Heparin Dosing Weight: TBW (used [place] nomogram for initial dosing)  Vital Signs: Temp: 97.1 F (36.2 C) (07/21 0000) Temp Source: Axillary (07/21 0000) BP: 100/56 (07/21 0115) Pulse Rate: 78 (07/21 0115)  Labs: Recent Labs    01/24/21 0958 01/18/2021 1406 02/01/2021 1820 02/02/2021 2100 01/26/21 0100  HGB 10.5* 11.5*  --   --  10.7*  HCT 30.5* 35.3*  --   --  32.5*  PLT 98* 73*  --  76* 72*  APTT  --   --   --  >200*  --   LABPROT  --   --   --  32.6*  --   INR  --   --   --  3.2*  --   HEPARINUNFRC  --   --   --   --  0.42  CREATININE 0.95 1.11*  --   --  1.24*  TROPONINIHS  --  177* 165*  --   --      Estimated Creatinine Clearance: 35.2 mL/min (A) (by C-G formula based on SCr of 1.24 mg/dL (H)).   Medical History: Past Medical History:  Diagnosis Date   Arthritis    reports her joints are fine and she has no pain    Ascending aortic aneurysm ([place]) 07/20/2014   4.1 cm by CT   Atrial fib/flutter, transient    post-op   CHF (congestive heart failure) ([place])    a. EF 30-35% by echo in 2013 --> similar results by repeat imaging in 2016 and 11/2017.   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 ([place]) 09/2011   Stage IB- s/p right upper lobectomy   Pneumonia    Psoriasis    Ankles   Tobacco abuse     Medications:  Medications Prior to Admission  Medication Sig Dispense Refill Last Dose   acetaminophen (TYLENOL) 500 MG tablet Take 500 mg by mouth every 6 (six)  hours as needed for mild pain.   unk   alendronate (FOSAMAX) 70 MG tablet Take 70 mg by mouth every Monday.   3 01/23/2021 at 0900   aspirin EC 81 MG EC tablet Take 1 tablet (81 mg total) by mouth daily. Swallow whole. 30 tablet 11 02/03/2021 at 0900   atorvastatin (LIPITOR) 40 MG tablet Take 1 tablet (40 mg total) by mouth daily. (Patient taking differently: Take 40 mg by mouth at bedtime.) 30 tablet 6 01/24/2021 at 2100   clopidogrel (PLAVIX) 75 MG tablet Take 1 tablet (75 mg total) by mouth daily. 30 tablet 6 01/23/2021 at 0900   glucosamine-chondroitin 500-400 MG tablet Take 1 tablet by mouth daily.   01/07/2021 at 0900   hydrochlorothiazide (HYDRODIURIL) 12.5 MG tablet Take 6.25 mg by mouth daily.   01/23/2021 at 1400   HYDROcodone-acetaminophen (NORCO/VICODIN) 5-325 MG tablet Take 1 tablet by mouth every 6 (six) hours as needed. (Patient taking differently: Take 1 tablet by mouth every 6 (six) hours  as needed (for pain).) 12 tablet 0 01/24/2021 at 0904   meclizine (ANTIVERT) 12.5 MG tablet Take 12.5 mg by mouth daily as needed for dizziness.   unk   metoprolol succinate (TOPROL-XL) 50 MG 24 hr tablet Take 25 mg by mouth in the morning. Take with or immediately following a meal.   01/18/2021 at 0900   Multiple Vitamins-Iron (MULTIVITAMINS WITH IRON) TABS Take 1 tablet by mouth daily.   01/13/2021 at 0900   Omega-3 Fatty Acids (FISH OIL) 500 MG CAPS Take 500 mg by mouth daily.    01/24/2021 at 0900   osimertinib mesylate (TAGRISSO) 80 MG tablet Take 1 tablet (80 mg total) by mouth daily. 30 tablet 3 02/01/2021 at 0900   OXYGEN Inhale 2 L/min into the lungs See admin instructions. "2 L/min every day and night shift to keep O2 sats ABOVE 90%"   01/12/2021   OYSTER SHELL CALCIUM + D 500-200 MG-UNIT TABS Take 1 tablet by mouth 3 (three) times daily.   01/27/2021 at 0900   pantoprazole (PROTONIX) 20 MG tablet Take 20 mg by mouth daily before breakfast.   01/16/2021 at 0600   potassium chloride SA (KLOR-CON) 20 MEQ  tablet Take 1 tablet (20 mEq total) by mouth daily. 7 tablet 0 01/14/2021 at 0900   prochlorperazine (COMPAZINE) 10 MG tablet Take 1 tablet (10 mg total) by mouth every 6 (six) hours as needed. (Patient taking differently: Take 10 mg by mouth every 6 (six) hours as needed for nausea or vomiting.) 30 tablet 2 unk   vitamin B-12 (CYANOCOBALAMIN) 500 MCG tablet Take 500 mcg by mouth daily.   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   BLE doppler positive for L peroneal DVT;  CTA : negative PE.   Baseline INR, aPTT: not done Prior anticoagulation: none, does take ASA + Plavix PTA   Today, 01/26/2021: Heparin level = 0.42 (therapeutic) with heparin gtt @ 1000 untis/hr CBC: Hgb 72k low but stable                             Plt noted to be low and significantly lower from last week. SCr inc to 1.24 Noted to have a hematoma where IV site was pulled out 7/20, but no further bleeding from this or any other site per RN today  Goal of Therapy: Heparin level 0.3-0.7 units/ml Monitor platelets by anticoagulation protocol: Yes  Plan: Continue Heparin 1000 units/hr IV infusion Recheck heparin level in 8 hrs to confirm therapeutic dose Daily CBC, daily heparin level once stable Monitor for signs of bleeding or thrombosis  Leone Haven, PharmD 01/26/2021, 1:55 AM

## 2021-01-26 NOTE — Progress Notes (Signed)
  Echocardiogram 2D Echocardiogram has been performed.  Heather [name] 01/26/2021, 1:13 PM

## 2021-01-26 NOTE — Progress Notes (Signed)
PROGRESS NOTE    Heather [name]  ATF:[id number] DOB: July 02, 1941 DOA: 01/27/2021 PCP: Rosita Fire, MD    Chief Complaint  Patient presents with   Shortness of Breath    Brief Narrative: Patient is a 80 year old female history of non-small cell lung cancer, stage IV, prior transient paroxysmal A. fib not on anticoagulation, hypertension, PAD with recent amputation of 2 toes on the left foot due to necrosis last week, chronic systolic CHF with EF 30 to 35% on prior 2D echo presented to the ED with worsening shortness of breath, increased O2 requirements and requiring at least 6 L of O2 at facility.  Patient had presented with worsening shortness of breath.  Patient noted to have a progressive decline over the past month.  Work-up in the ED including CT angiogram chest concerning for worsening non-small cell lung cancer, lower extremity Dopplers done concerning for left lower extremity peroneal DVT and patient started on heparin, DIC panel obtained concerning for DIC.  Patient on Cardizem drip.  Patient placed on empiric IV antibiotics for possible pneumonia.  Patient with worsening respiratory status, trial of BiPAP done on admission discontinued due to nausea and possible emesis.  Oncology consulted.  We will consult with PCCM due to worsening respiratory status, impending DIC.  Palliative care consultation for goals of care.   Assessment & Plan:   Principal Problem:   Acute on chronic respiratory failure with hypoxia (HCC) Active Problems:   DIC (disseminated intravascular coagulation) ([place])   Adenocarcinoma of left lung, stage 4 (HCC)   Atherosclerosis of native artery of left lower extremity with gangrene (HCC)   Atrial fibrillation with RVR (HCC)   Left leg DVT (HCC)   Decubitus ulcer of sacral region, stage 1   Acute on chronic systolic CHF (congestive heart failure) (HCC)   CAP (community acquired pneumonia)   Acute on chronic congestive heart failure (HCC)   Hypokalemia   #1  acute on chronic respiratory failure with hypoxia -Likely multifactorial secondary to worsening non-small cell lung cancer stage IV in the setting of possible CAP, component of acute CHF secondary to A. fib with RVR, possible PAH, concern for Tagrisso pulmonary toxicity. -Patient initially placed on the BiPAP which had to be discontinued due to onset of nausea. -Patient with increased respiratory rate, use of accessory muscles of respiration and at high risk for intubation. -Continue empiric IV antibiotics. -Oncology consulted. -Consult with PCCM for further evaluation and management.  2.  Probable DIC -Questionable etiology, likely secondary to worsening malignancy and possible infection etiology -Concern for DIC as patient with elevated PTT > 200, fibrinogen of < 60, D-dimer of 17.35, smear with schistocytes noted.  Patient on heparin drip secondary to left lower extremity DVT. -Hematology consulted. -Consulted PCCM for further evaluation and management. -??  FFP or cryo however will defer to PCCM and hematology. -Continue treatment of IV antibiotics.  3.  Suspected CAP -MRSA PCR negative. -COVID-19 PCR negative. -Continue empiric IV Rocephin and IV azithromycin.  4.  A. fib with RVR -Currently on a Cardizem drip. -Per RN patient unable to tolerate oral pills and as such we will change Toprol-XL to oral Lopressor twice daily. -2D echo pending. -Continue heparin.  5.  Acute on chronic systolic CHF -Likely transient secondary to A. fib with RVR. -Patient not volume overloaded on examination with prerenal appearance of kidneys. -Patient noted with poor oral intake. -2D echo pending. -Strict I's and O's, daily weights.  6.  Non-small cell lung cancer -Worsening. -Continue to  hold Tagrisso secondary to problem #1. -Oncology consulted.  7.  Left lower extremity DVT -CT angiogram chest negative for PE. -Continue heparin drip. -Hematology/oncology consulted.  8.   Thrombocytopenia -Patient with a DVT left lower extremity. -Thrombocytopenia may be secondary to DIC versus consumption of platelets by DVT. -Monitor closely on heparin drip.  9.  Left lower extremity gangrene of the toes -Status post amputation of fourth and fifth toes last week. -Second and third toes dusky. -Monitor closely. -Continue wound dressing. -Monitor over the next 24 hours and depending on clinical trajectory and palliative care consultation may consider involvement with vascular surgery. -Follow.  10.  Hypertension -Change Toprol-XL to oral Lopressor. -On Cardizem drip.  11.  Hypokalemia -Replete.    DVT prophylaxis: Heparin drip Code Status: Full Family Communication: Updated patient, daughter at bedside. Disposition:   Status is: Inpatient  Remains inpatient appropriate because:IV treatments appropriate due to intensity of illness or inability to take PO  Dispo: The patient is from: SNF              Anticipated d/c is to:  TBD              Patient currently is not medically stable to d/c.   Difficult to place patient No       Consultants:  Oncology  Procedures:  CT angiogram chest 01/14/2021 Chest x-ray 01/27/2021, 01/26/2021 Lower extremity Doppler 02/05/2021    Antimicrobials:  IV azithromycin 02/01/2021>>>> IV Rocephin 01/31/2021>>>>   Subjective: In moderate respiratory distress with use of accessory muscles of respiration.  On 8 L high flow nasal cannula.  Alert.  Difficulty understanding patient.  Currently on Cardizem drip.  No overt bleeding noted.  Objective: Vitals:   01/26/21 0500 01/26/21 0700 01/26/21 0800 01/26/21 0820  BP: 111/81 (!) 150/75    Pulse: 89 82    Resp: (!) 33 (!) 36    Temp:   98.3 F (36.8 C)   TempSrc:   Axillary   SpO2: 100% 99%  100%  Weight:      Height:        Intake/Output Summary (Last 24 hours) at 01/26/2021 1108 Last data filed at 01/26/2021 0830 Gross per 24 hour  Intake 673.44 ml  Output --   Net 673.44 ml   Filed Weights   01/14/2021 1604  Weight: 70 kg    Examination:  General exam: On 8 L high flow nasal cannula.  Moderate respiratory distress.  Use of accessory muscles of respiration. Respiratory system: Use of accessory muscles of respiration.  Decreased breath sounds in the left upper lobe.  Some coarse breath sounds anterior lung fields.  Cardiovascular system: Irregularly irregular. No JVD, murmurs, rubs, gallops or clicks. No pedal edema. Gastrointestinal system: Abdomen is nondistended, soft and nontender. No organomegaly or masses felt. Normal bowel sounds heard. Central nervous system: Alert and oriented. No focal neurological deficits. Extremities: Left foot wrapped in bandage with dusky appearance of first 2-3 toes. Skin: No rashes, lesions or ulcers Psychiatry: Judgement and insight appear poor to fair. Mood & affect appropriate.     Data Reviewed: I have personally reviewed following labs and imaging studies  CBC: Recent Labs  Lab 01/24/21 0958 01/09/2021 1406 01/10/2021 2100 01/26/21 0100  WBC 15.8* 15.2*  --  17.9*  NEUTROABS 12.1* 10.7*  --   --   HGB 10.5* 11.5*  --  10.7*  HCT 30.5* 35.3*  --  32.5*  MCV 91.3 95.4  --  95.3  PLT 98*  73* 76* 72*    Basic Metabolic Panel: Recent Labs  Lab 01/24/21 0958 01/24/2021 1406 01/26/21 0100 01/26/21 0855  NA 146* 146* 146* 147*  K 3.1* 3.5 3.3* 3.5  CL 103 103 105 107  CO2 28 29 29 30   GLUCOSE 147* 141* 118* 99  BUN 34* 48* 49* 53*  CREATININE 0.95 1.11* 1.24* 1.35*  CALCIUM 10.1 10.8* 9.9 9.6    GFR: Estimated Creatinine Clearance: 32.3 mL/min (A) (by C-G formula based on SCr of 1.35 mg/dL (H)).  Liver Function Tests: Recent Labs  Lab 01/24/21 0958 01/19/2021 1406 01/26/21 0855  AST 55* 58* 52*  ALT 58* 66* 48*  ALKPHOS 202* 219* 156*  BILITOT 1.3* 1.5* 0.9  PROT 6.5 7.2 5.6*  ALBUMIN 3.2* 3.8 3.0*    CBG: No results for input(s): GLUCAP in the last 168 hours.   Recent Results  (from the past 240 hour(s))  Surgical PCR screen     Status: None   Collection Time: 01/17/21  8:28 PM   Specimen: Nasal Mucosa; Nasal Swab  Result Value Ref Range Status   MRSA, PCR NEGATIVE NEGATIVE Final   Staphylococcus aureus NEGATIVE NEGATIVE Final    Comment: (NOTE) The Xpert SA Assay (FDA approved for NASAL specimens in patients 75 years of age and older), is one component of a comprehensive surveillance program. It is not intended to diagnose infection nor to guide or monitor treatment. Performed at [facility], [place] [address] [place], [place] [address]   SARS CORONAVIRUS 2 (TAT 6-24 HRS) Nasopharyngeal Nasopharyngeal Swab     Status: None   Collection Time: 01/19/21  8:44 PM   Specimen: Nasopharyngeal Swab  Result Value Ref Range Status   SARS Coronavirus 2 NEGATIVE NEGATIVE Final    Comment: (NOTE) SARS-CoV-2 target nucleic acids are NOT DETECTED.  The SARS-CoV-2 RNA is generally detectable in upper and lower respiratory specimens during the acute phase of infection. Negative results do not preclude SARS-CoV-2 infection, do not rule out co-infections with other pathogens, and should not be used as the sole basis for treatment or other patient management decisions. Negative results must be combined with clinical observations, patient history, and epidemiological information. The expected result is Negative.  Fact Sheet for Patients: SugarRoll.be  Fact Sheet for Healthcare Providers: https://www.woods-mathews.com/  This test is not yet approved or cleared by the [place] FDA and  has been authorized for detection and/or diagnosis of SARS-CoV-2 by FDA under an Emergency Use Authorization (EUA). This EUA will remain  in effect (meaning this test can be used) for the duration of the COVID-19 declaration under Se ction 564(b)(1) of the Act, 21 U.S.C. section 360bbb-3(b)(1), unless the authorization is terminated  or revoked sooner.  Performed at [facility], [place] [address] [place], [place] [address]   Resp Panel by RT-PCR (Flu A&B, Covid) Nasopharyngeal Swab     Status: None   Collection Time: 02/05/2021  2:06 PM   Specimen: Nasopharyngeal Swab; Nasopharyngeal(NP) swabs in vial transport medium  Result Value Ref Range Status   SARS Coronavirus 2 by RT PCR NEGATIVE NEGATIVE Final    Comment: (NOTE) SARS-CoV-2 target nucleic acids are NOT DETECTED.  The SARS-CoV-2 RNA is generally detectable in upper respiratory specimens during the acute phase of infection. The lowest concentration of SARS-CoV-2 viral copies this assay can detect is 138 copies/mL. A negative result does not preclude SARS-Cov-2 infection and should not be used as the sole basis for treatment or other patient  management decisions. A negative result may occur with  improper specimen collection/handling, submission of specimen other than nasopharyngeal swab, presence of viral mutation(s) within the areas targeted by this assay, and inadequate number of viral copies(<138 copies/mL). A negative result must be combined with clinical observations, patient history, and epidemiological information. The expected result is Negative.  Fact Sheet for Patients:  EntrepreneurPulse.com.au  Fact Sheet for Healthcare Providers:  IncredibleEmployment.be  This test is no t yet approved or cleared by the [place] FDA and  has been authorized for detection and/or diagnosis of SARS-CoV-2 by FDA under an Emergency Use Authorization (EUA). This EUA will remain  in effect (meaning this test can be used) for the duration of the COVID-19 declaration under Section 564(b)(1) of the Act, 21 U.S.C.section 360bbb-3(b)(1), unless the authorization is terminated  or revoked sooner.       Influenza A by PCR NEGATIVE NEGATIVE Final   Influenza B by PCR NEGATIVE NEGATIVE Final    Comment: (NOTE) The Xpert  Xpress SARS-CoV-2/FLU/RSV plus assay is intended as an aid in the diagnosis of influenza from Nasopharyngeal swab specimens and should not be used as a sole basis for treatment. Nasal washings and aspirates are unacceptable for Xpert Xpress SARS-CoV-2/FLU/RSV testing.  Fact Sheet for Patients: EntrepreneurPulse.com.au  Fact Sheet for Healthcare Providers: IncredibleEmployment.be  This test is not yet approved or cleared by the [place] FDA and has been authorized for detection and/or diagnosis of SARS-CoV-2 by FDA under an Emergency Use Authorization (EUA). This EUA will remain in effect (meaning this test can be used) for the duration of the COVID-19 declaration under Section 564(b)(1) of the Act, 21 U.S.C. section 360bbb-3(b)(1), unless the authorization is terminated or revoked.  Performed at [facility], [place] [address] [place], [place] [address]   MRSA Next Gen by PCR, Nasal     Status: None   Collection Time: 02/04/2021  9:07 PM   Specimen: Nasal Mucosa; Nasal Swab  Result Value Ref Range Status   MRSA by PCR Next Gen NOT DETECTED NOT DETECTED Final    Comment: (NOTE) The GeneXpert MRSA Assay (FDA approved for NASAL specimens only), is one component of a comprehensive MRSA colonization surveillance program. It is not intended to diagnose MRSA infection nor to guide or monitor treatment for MRSA infections. Test performance is not FDA approved in patients less than 67 years old. Performed at [facility], [place] [address], [place], [place] [address]          Radiology Studies: CT Angio Chest PE W/Cm &/Or Wo Cm  Result Date: 01/09/2021 CLINICAL DATA:  PE suspected, high prob. Pt has a hx of lung cancer. Pt c/o shortness of breath today at 1145. Pt was placed on 6L and was titrated down to 2L. Recent toe surgery EXAM: CT ANGIOGRAPHY CHEST WITH CONTRAST TECHNIQUE: Multidetector CT imaging of the  chest was performed using the standard protocol during bolus administration of intravenous contrast. Multiplanar CT image reconstructions and MIPs were obtained to evaluate the vascular anatomy. CONTRAST:  12mL OMNIPAQUE IOHEXOL 350 MG/ML SOLN COMPARISON:  CT chest 09/30/2020 FINDINGS: Cardiovascular: Satisfactory opacification of the pulmonary arteries to the segmental level. No evidence of pulmonary embolism. Enlarged main pulmonary artery. Cardiomegaly. No pericardial effusion. The thoracic aorta is in caliber. Atherosclerotic plaque is noted. Artery calcifications noted. Mediastinum/Nodes: Limited evaluation for lymphadenopathy due to timing of contrast. No enlarged mediastinal, hilar, or axillary lymph nodes. Thyroid gland, trachea, and esophagus demonstrate no significant findings. Lungs/Pleura:  Interval increase in size of a difficult to measure 4.1 x 1.9 x 4.3 Cm (from 1.6 x 1.4 cm) left apical spiculated pulmonary mass. Less conspicuous 1 1 cm nodular like density within lower lobe (9:58). Interval increase in diffuse patchy ground-glass airspace opacities that are more prominent within the lower lung zones. Bilateral trace volume pleural effusions. No pneumothorax. Upper Abdomen: Poor visualization of a suprarenal abdominal aorta aneurysm measuring up to 3.2 cm on axial imaging (7:35). Poor visualization of abdominal aorta aneurysm to 3.6 cm axial imaging (7:50). Partially visualized fluid density lesion kidney. No acute abnormality. No acute abnormality. Musculoskeletal: No chest wall abnormality. No suspicious lytic or blastic osseous lesions. No acute displaced fracture. Multilevel degenerative changes of the spine. Review of the MIP images confirms the above findings. IMPRESSION: 1. No pulmonary embolus. 2. Interval increase in size of a difficult to measure 4.1 x 1.9 cm (from 1.6 x 1.4 cm) left apical spiculated pulmonary mass consistent with lung cancer. 3. Interval increase in diffuse patchy  ground-glass airspace opacities. Differential diagnosis includes infection versus inflammation versus adenocarcinoma. Additional imaging evaluation or consultation with Pulmonology or Thoracic Surgery recommended. 4. Bilateral trace effusions. 5. Enlarged main pulmonary artery suggestive of pulmonary hypertension. 6. Cardiomegaly. 7. Poorly visualized/incompletely evaluated abdominal aorta aneurysm measuring up to at least 3.6 cm. Recommend follow-up ultrasound every 2 years. This recommendation follows ACR consensus guidelines: White Paper of the ACR Incidental Findings Committee II on Vascular Findings. J Am Coll Radiol 2013; 10:789-794. 8.  Aortic Atherosclerosis (ICD10-I70.0). Electronically Signed   By: Iven Finn M.D.   On: 01/15/2021 19:00   DG CHEST PORT 1 VIEW  Result Date: 01/26/2021 CLINICAL DATA:  Shortness of breath and history of lung carcinoma EXAM: PORTABLE CHEST 1 VIEW COMPARISON:  01/21/2021 FINDINGS: Cardiac shadow is enlarged but stable. Aortic calcifications are again seen. Patchy airspace opacities are noted bilaterally stable in appearance from the prior study. The patient's known left upper lobe mass lesion is not as well seen as on prior CT examination. No new focal abnormality is noted. IMPRESSION: Stable patchy airspace opacities bilaterally. Electronically Signed   By: Inez Catalina M.D.   On: 01/26/2021 08:50   DG Chest Portable 1 View  Result Date: 02/03/2021 CLINICAL DATA:  Shortness of breath lung cancer EXAM: PORTABLE CHEST 1 VIEW COMPARISON:  01/11/2021, CT chest 09/30/2020, chest x-ray 01/02/2021 FINDINGS: Cardiomegaly with interim development of small bilateral pleural effusions and diffuse bilateral ground-glass opacity, possible pulmonary edema versus diffuse pneumonia. Left upper lobe lung mass is better seen on CT. Aortic atherosclerosis. IMPRESSION: 1. Cardiomegaly with small bilateral pleural effusions and diffuse bilateral ground-glass opacities consistent with  pulmonary edema versus diffuse pneumonia 2. Known left upper lobe lung mass is better seen on CT Electronically Signed   By: Donavan Foil M.D.   On: 01/07/2021 15:16   VAS [place] LOWER EXTREMITY VENOUS (DVT) ([facility] and [facility] 7a-7p)  Result Date: 02/03/2021  Lower Venous DVT Study Patient Name:  Heather [name]  Date of Exam:   01/06/2021 Medical Rec #: [id number]        Accession #:    [id number] Date of Birth: 1941/01/27        Patient Gender: F Patient Age:   79Y Exam Location:  [facility] Procedure:      VAS [place] LOWER EXTREMITY VENOUS (DVT) Referring Phys: [id number] RACHEL MORGAN LITTLE --------------------------------------------------------------------------------  Indications: Pain.  Risk Factors: None identified. Comparison Study: No prior studies. Performing Technologist: Oliver Hum RVT  Examination Guidelines: A complete evaluation includes B-mode imaging, spectral Doppler, color Doppler, and power Doppler as needed of all accessible portions of each vessel. Bilateral testing is considered an integral part of a complete examination. Limited examinations for reoccurring indications may be performed as noted. The reflux portion of the exam is performed with the patient in reverse Trendelenburg.  +-----+---------------+---------+-----------+----------+--------------+ RIGHTCompressibilityPhasicitySpontaneityPropertiesThrombus Aging +-----+---------------+---------+-----------+----------+--------------+ CFV  Full           Yes      Yes                                 +-----+---------------+---------+-----------+----------+--------------+   +---------+---------------+---------+-----------+----------+--------------+ LEFT     CompressibilityPhasicitySpontaneityPropertiesThrombus Aging +---------+---------------+---------+-----------+----------+--------------+ CFV      Full           Yes      Yes                                  +---------+---------------+---------+-----------+----------+--------------+ SFJ      Full                                                        +---------+---------------+---------+-----------+----------+--------------+ FV Prox  Full                                                        +---------+---------------+---------+-----------+----------+--------------+ FV Mid   Full                                                        +---------+---------------+---------+-----------+----------+--------------+ FV DistalFull                                                        +---------+---------------+---------+-----------+----------+--------------+ PFV      Full                                                        +---------+---------------+---------+-----------+----------+--------------+ POP      Full           Yes      Yes                                 +---------+---------------+---------+-----------+----------+--------------+ PTV      Full                                                        +---------+---------------+---------+-----------+----------+--------------+  PERO     Partial                                      Acute          +---------+---------------+---------+-----------+----------+--------------+     Summary: RIGHT: - No evidence of common femoral vein obstruction.  LEFT: - Findings consistent with acute deep vein thrombosis involving the left peroneal veins. - No cystic structure found in the popliteal fossa.  *See table(s) above for measurements and observations.    Preliminary         Scheduled Meds:  atorvastatin  40 mg Oral QHS   Chlorhexidine Gluconate Cloth  6 each Topical Daily   cyanocobalamin  1,000 mcg Subcutaneous Daily   mouth rinse  15 mL Mouth Rinse BID   metoprolol tartrate  12.5 mg Oral BID   pantoprazole (PROTONIX) IV  40 mg Intravenous Q24H   Continuous Infusions:  azithromycin Stopped (01/27/2021 2326)    cefTRIAXone (ROCEPHIN)  IV Stopped (01/23/2021 2138)   diltiazem (CARDIZEM) infusion 7.5 mg/hr (01/26/21 0830)   heparin 1,000 Units/hr (01/26/21 0830)   potassium chloride 10 mEq (01/26/21 1037)     LOS: 1 day    Time spent: 45 mins    Irine Seal, MD [facility]   To contact the attending provider between 7A-7P or the covering provider during after hours 7P-7A, please log into the web site [email or web address] and access using universal [place] password for that web site. If you do not have the password, please call the hospital operator.  01/26/2021, 11:08 AM

## 2021-01-26 NOTE — Progress Notes (Signed)
HEMATOLOGY-ONCOLOGY PROGRESS NOTE  SUBJECTIVE: Ms. Bayle is followed by Dr. Julien Nordmann for stage IV non-small cell lung cancer, adenocarcinoma.  She is currently receiving treatment with Tagrisso.  She was seen earlier this week in our office.  It was known that the patient had missed some of her treatment with Tagrisso due to recurrent hospitalization.  Unclear how much of her chemotherapy treatment she has missed at this point in time.  I saw the patient this morning in her hospital room.  Her daughter is at the bedside.  The patient is speaking incoherently and difficult to understand.  She continues to have tachypnea.  She was placed on BiPAP initially but developed nausea and could not tolerate.  Now on nasal cannula.  She is not having any fevers.  No bleeding reported.  Oncology History Overview Note  Thoracoscopic Right upper lobectomy 09/2011    Non-small cell carcinoma of right lung, stage 1 ([place])  07/20/2014 Initial Diagnosis   Non-small cell carcinoma of right lung, stage 1    Adenocarcinoma of left lung, stage 4 ([place])  12/15/2020 Initial Diagnosis   Adenocarcinoma of left lung, stage 4 ([place])    12/20/2020 -  Chemotherapy    Patient is on Treatment Plan: LUNG NSCLC OSIMERTINIB Q28D        REVIEW OF SYSTEMS:   Unable to obtain except as noted in the HPI.  I have reviewed the past medical history, past surgical history, social history and family history with the patient and they are unchanged from previous note.   PHYSICAL EXAMINATION: ECOG PERFORMANCE STATUS: 3 - Symptomatic, >50% confined to bed  Vitals:   01/26/21 0700 01/26/21 0800  BP: (!) 150/75   Pulse: 82   Resp: (!) 36   Temp:  98.3 F (36.8 C)  SpO2: 99%    Filed Weights   01/13/2021 1604  Weight: 70 kg    Intake/Output from previous day: 07/20 0701 - 07/21 0700 In: 613.4 [I.V.:196.4; IV Piggyback:417] Out: -   GENERAL: Chronically ill-appearing female, tachypneic SKIN: Toes on left foot dusky EYES:  normal, Conjunctiva are pink and non-injected, sclera clear OROPHARYNX:no exudate, no erythema and lips, buccal mucosa, and tongue normal  LUNGS: Tachypneic, use of accessory muscles, anterior lung fields with coarse breath sounds. HEART: r tachycardic, irregular ABDOMEN:abdomen soft, non-tender and normal bowel sounds NEURO: Alert, judgment and insight appear poor.  LABORATORY DATA:  I have reviewed the data as listed CMP Latest Ref Rng & Units 01/26/2021 02/03/2021 01/24/2021  Glucose 70 - 99 mg/dL 118(H) 141(H) 147(H)  BUN 8 - 23 mg/dL 49(H) 48(H) 34(H)  Creatinine 0.44 - 1.00 mg/dL 1.24(H) 1.11(H) 0.95  Sodium 135 - 145 mmol/L 146(H) 146(H) 146(H)  Potassium 3.5 - 5.1 mmol/L 3.3(L) 3.5 3.1(L)  Chloride 98 - 111 mmol/L 105 103 103  CO2 22 - 32 mmol/L _0 Calcium 8.9 - 10.3 mg/dL 9.9 10.8(H) 10.1  Total Protein 6.5 - 8.1 g/dL - 7.2 6.5  Total Bilirubin 0.3 - 1.2 mg/dL - 1.5(H) 1.3(H)  Alkaline Phos 38 - 126 U/L - 219(H) 202(H)  AST 15 - 41 U/L - 58(H) 55(H)  ALT 0 - 44 U/L - 66(H) 58(H)    Lab Results  Component Value Date   WBC 17.9 (H) 01/26/2021   HGB 10.7 (L) 01/26/2021   HCT 32.5 (L) 01/26/2021   MCV 95.3 01/26/2021   PLT 72 (L) 01/26/2021   NEUTROABS 10.7 (H) 01/21/2021    DG Chest 2 View  Result  Date: 01/11/2021 CLINICAL DATA:  Hemoptysis.  History of lung cancer EXAM: CHEST - 2 VIEW COMPARISON:  11/15/2020 FINDINGS: Cardiomegaly. Stable aortic and hilar contours. Interstitial coarsening with chronic patchy bilateral pulmonary opacity greatest at the left apex and right base. No Kerley lines, effusion, or pneumothorax. IMPRESSION: 1. No localized source of hemoptysis. 2. Chronic cardiomegaly and bilateral pulmonary opacities. Electronically Signed   By: Monte Fantasia M.D.   On: 01/11/2021 04:27   CT Angio Chest PE W/Cm &/Or Wo Cm  Result Date: 01/14/2021 CLINICAL DATA:  PE suspected, high prob. Pt has a hx of lung cancer. Pt c/o shortness of breath today at 1145.  Pt was placed on 6L and was titrated down to 2L. Recent toe surgery EXAM: CT ANGIOGRAPHY CHEST WITH CONTRAST TECHNIQUE: Multidetector CT imaging of the chest was performed using the standard protocol during bolus administration of intravenous contrast. Multiplanar CT image reconstructions and MIPs were obtained to evaluate the vascular anatomy. CONTRAST:  78m OMNIPAQUE IOHEXOL 350 MG/ML SOLN COMPARISON:  CT chest 09/30/2020 FINDINGS: Cardiovascular: Satisfactory opacification of the pulmonary arteries to the segmental level. No evidence of pulmonary embolism. Enlarged main pulmonary artery. Cardiomegaly. No pericardial effusion. The thoracic aorta is in caliber. Atherosclerotic plaque is noted. Artery calcifications noted. Mediastinum/Nodes: Limited evaluation for lymphadenopathy due to timing of contrast. No enlarged mediastinal, hilar, or axillary lymph nodes. Thyroid gland, trachea, and esophagus demonstrate no significant findings. Lungs/Pleura: Interval increase in size of a difficult to measure 4.1 x 1.9 x 4.3 Cm (from 1.6 x 1.4 cm) left apical spiculated pulmonary mass. Less conspicuous 1 1 cm nodular like density within lower lobe (9:58). Interval increase in diffuse patchy ground-glass airspace opacities that are more prominent within the lower lung zones. Bilateral trace volume pleural effusions. No pneumothorax. Upper Abdomen: Poor visualization of a suprarenal abdominal aorta aneurysm measuring up to 3.2 cm on axial imaging (7:35). Poor visualization of abdominal aorta aneurysm to 3.6 cm axial imaging (7:50). Partially visualized fluid density lesion kidney. No acute abnormality. No acute abnormality. Musculoskeletal: No chest wall abnormality. No suspicious lytic or blastic osseous lesions. No acute displaced fracture. Multilevel degenerative changes of the spine. Review of the MIP images confirms the above findings. IMPRESSION: 1. No pulmonary embolus. 2. Interval increase in size of a difficult to  measure 4.1 x 1.9 cm (from 1.6 x 1.4 cm) left apical spiculated pulmonary mass consistent with lung cancer. 3. Interval increase in diffuse patchy ground-glass airspace opacities. Differential diagnosis includes infection versus inflammation versus adenocarcinoma. Additional imaging evaluation or consultation with Pulmonology or Thoracic Surgery recommended. 4. Bilateral trace effusions. 5. Enlarged main pulmonary artery suggestive of pulmonary hypertension. 6. Cardiomegaly. 7. Poorly visualized/incompletely evaluated abdominal aorta aneurysm measuring up to at least 3.6 cm. Recommend follow-up ultrasound every 2 years. This recommendation follows ACR consensus guidelines: White Paper of the ACR Incidental Findings Committee II on Vascular Findings. J Am Coll Radiol 2013; 10:789-794. 8.  Aortic Atherosclerosis (ICD10-I70.0). Electronically Signed   By: MIven FinnM.D.   On: 01/18/2021 19:00   PERIPHERAL VASCULAR CATHETERIZATION  Result Date: 01/12/2021 Formatting of this result is different from the original. Patient name: BISRA LINDY        MRN: [id number]       DOB: 712/07/1940         Sex: female   01/12/2021 Pre-operative Diagnosis: Critical limb ischemia of left lower extremity with tissue loss Post-operative diagnosis:  Same Surgeon:  CMarty Heck MD Procedure Performed: 1.  Ultrasound-guided access of right common femoral artery 2.  Aortogram including catheter selection of aorta 3.  Left lower extremity arteriogram with selection of third order branches 4.  Primary stent of left distal SFA/above knee popliteal artery high-grade stenosis with post balloon angioplasty (7 mm x 80 mm drug-coated Eluvia, postdilated with a 6 mm balloon)   Indications: 80 year old female seen in consultation yesterday by Dr. Stanford Breed with concern for ischemic left lower extremity.  She presents today for left lower extremity arteriogram plus intervention after risks benefits discussed.   Findings: Aortogram showed  patent renal arteries and no flow-limiting stenosis in the aortoiliac segment..  Did have ectatic segment in the visceral segment of her abdominal aorta.   Left lower extremity runoff showed a patent common femoral and profunda with a focal high-grade greater than 80% stenosis in the distal SFA at Hunter's canal with some associated acute thrombus.  Distally the popliteal artery was widely patent with at least two-vessel runoff in the anterior tibial and posterior tibial artery given the peroneal was diminutive in the mid calf.   The lesion was crossed and primarily stented with a 7 mm x 80 mm drug coated Eluvia that was postdilated with a 6 mm angioplasty balloon.  I elected primary simply stent given concern for acute thrombus associated with this lesion             Procedure:  The patient was identified in the holding area and taken to room 8.  The patient was then placed supine on the table and prepped and draped in the usual sterile fashion.  A time out was called.  Ultrasound was used to evaluate the right common femoral artery.  It was patent .  A digital ultrasound image was acquired.  A micropuncture needle was used to access the right common femoral artery under ultrasound guidance.  An 018 wire was advanced without resistance and a micropuncture sheath was placed.  The 018 wire was removed and a benson wire was placed.  The micropuncture sheath was exchanged for a 5 french sheath.  An omniflush catheter was advanced over the wire to the level of L-1.  An abdominal angiogram was obtained.  Next, using the omniflush catheter and a benson wire, the aortic bifurcation was crossed and the catheter was placed into theleft external iliac artery and left runoff was obtained.  Ultimately identified a high-grade greater than 80% stenosis in the distal left SFA at Hunter's canal.  We elected to treat this with primary stent placement.  A Glidewire advantage was then used to exchange for a long 6 French sheath in the  right groin over the aortic bifurcation.  Patient was given 100 units/kg IV heparin.  I then crossed the SFA lesion with a Glidewire advantage and confirmed with hand-injection after we crossed the lesion.  This was then primarily stented with a 7 mm x 80 mm Eluvia postdilated with a 6 mm Mustang.  Final results are widely patent stent with no residual stenosis and preserved two-vessel runoff distally.  She was taken to holding to have sheath removed.   Complication: None   Condition: Stable         Marty Heck, MD [facility] Office: [phone]    DG Chest Portable 1 View  Result Date: 01/07/2021 CLINICAL DATA:  Shortness of breath lung cancer EXAM: PORTABLE CHEST 1 VIEW COMPARISON:  01/11/2021, CT chest 09/30/2020, chest x-ray 01/02/2021 FINDINGS: Cardiomegaly with interim development of small bilateral  pleural effusions and diffuse bilateral ground-glass opacity, possible pulmonary edema versus diffuse pneumonia. Left upper lobe lung mass is better seen on CT. Aortic atherosclerosis. IMPRESSION: 1. Cardiomegaly with small bilateral pleural effusions and diffuse bilateral ground-glass opacities consistent with pulmonary edema versus diffuse pneumonia 2. Known left upper lobe lung mass is better seen on CT Electronically Signed   By: Donavan Foil M.D.   On: 02/04/2021 15:16   VAS [place] ABI WITH/WO TBI  Result Date: 01/12/2021  LOWER EXTREMITY DOPPLER STUDY Patient Name:  EYVA CALIFANO  Date of Exam:   01/11/2021 Medical Rec #: [id number]        Accession #:    [id number] Date of Birth: 1940-09-24        Patient Gender: F Patient Age:   79Y Exam Location:  [facility] Procedure:      VAS [place] ABI WITH/WO TBI Referring Phys: [id number] Yevonne Aline HAWKEN --------------------------------------------------------------------------------  Indications: Ulceration. High Risk Factors: Hypertension, Diabetes, past history of smoking. Other Factors: History of lung cancer.   Comparison Study: No prior study Performing Technologist: Sharion Dove RVS  Examination Guidelines: A complete evaluation includes at minimum, Doppler waveform signals and systolic blood pressure reading at the level of bilateral brachial, anterior tibial, and posterior tibial arteries, when vessel segments are accessible. Bilateral testing is considered an integral part of a complete examination. Photoelectric Plethysmograph (PPG) waveforms and toe systolic pressure readings are included as required and additional duplex testing as needed. Limited examinations for reoccurring indications may be performed as noted.  ABI Findings: +---------+------------------+-----+-----------+--------+ Right    Rt Pressure (mmHg)IndexWaveform   Comment  +---------+------------------+-----+-----------+--------+ Brachial 131                    multiphasic         +---------+------------------+-----+-----------+--------+ PTA      0                 0.00 absent              +---------+------------------+-----+-----------+--------+ DP       149               1.08 multiphasic         +---------+------------------+-----+-----------+--------+ Great Toe127               0.92                     +---------+------------------+-----+-----------+--------+ +---------+------------------+-----+-----------+-------+ Left     Lt Pressure (mmHg)IndexWaveform   Comment +---------+------------------+-----+-----------+-------+ Brachial 138                    multiphasic        +---------+------------------+-----+-----------+-------+ ATA      108               0.78 monophasic         +---------+------------------+-----+-----------+-------+ DP       97                0.70 monophasic         +---------+------------------+-----+-----------+-------+ Great Toe0                 0.00                    +---------+------------------+-----+-----------+-------+  +-------+-----------+-----------+------------+------------+ ABI/TBIToday's ABIToday's TBIPrevious ABIPrevious TBI +-------+-----------+-----------+------------+------------+ Right  1.08       0.92                                +-------+-----------+-----------+------------+------------+  Left   0.78       0.00                                +-------+-----------+-----------+------------+------------+  Summary: Right: Resting right ankle-brachial index is within normal range. No evidence of significant right lower extremity arterial disease. The right toe-brachial index is normal. No flow noted in the posterior tibial artery using the imager from the distal calf to the foot. Left: Resting left ankle-brachial index indicates moderate left lower extremity arterial disease. The left toe-brachial index is abnormal.  *See table(s) above for measurements and observations.  Electronically signed by Monica Martinez MD on 01/12/2021 at 5:37:21 PM.    Final    VAS [place] LOWER EXTREMITY VENOUS (DVT) (MC and WL 7a-7p)  Result Date: 01/06/2021  Lower Venous DVT Study Patient Name:  ANAPAOLA KINSEL  Date of Exam:   01/20/2021 Medical Rec #: [id number]        Accession #:    [id number] Date of Birth: 03/20/1941        Patient Gender: F Patient Age:   79Y Exam Location:  [facility] Procedure:      VAS [place] LOWER EXTREMITY VENOUS (DVT) Referring Phys: [id number] RACHEL MORGAN LITTLE --------------------------------------------------------------------------------  Indications: Pain.  Risk Factors: None identified. Comparison Study: No prior studies. Performing Technologist: Oliver Hum RVT  Examination Guidelines: A complete evaluation includes B-mode imaging, spectral Doppler, color Doppler, and power Doppler as needed of all accessible portions of each vessel. Bilateral testing is considered an integral part of a complete examination. Limited examinations for reoccurring indications may be performed as noted.  The reflux portion of the exam is performed with the patient in reverse Trendelenburg.  +-----+---------------+---------+-----------+----------+--------------+ RIGHTCompressibilityPhasicitySpontaneityPropertiesThrombus Aging +-----+---------------+---------+-----------+----------+--------------+ CFV  Full           Yes      Yes                                 +-----+---------------+---------+-----------+----------+--------------+   +---------+---------------+---------+-----------+----------+--------------+ LEFT     CompressibilityPhasicitySpontaneityPropertiesThrombus Aging +---------+---------------+---------+-----------+----------+--------------+ CFV      Full           Yes      Yes                                 +---------+---------------+---------+-----------+----------+--------------+ SFJ      Full                                                        +---------+---------------+---------+-----------+----------+--------------+ FV Prox  Full                                                        +---------+---------------+---------+-----------+----------+--------------+ FV Mid   Full                                                        +---------+---------------+---------+-----------+----------+--------------+  FV DistalFull                                                        +---------+---------------+---------+-----------+----------+--------------+ PFV      Full                                                        +---------+---------------+---------+-----------+----------+--------------+ POP      Full           Yes      Yes                                 +---------+---------------+---------+-----------+----------+--------------+ PTV      Full                                                        +---------+---------------+---------+-----------+----------+--------------+ PERO     Partial                                       Acute          +---------+---------------+---------+-----------+----------+--------------+     Summary: RIGHT: - No evidence of common femoral vein obstruction.  LEFT: - Findings consistent with acute deep vein thrombosis involving the left peroneal veins. - No cystic structure found in the popliteal fossa.  *See table(s) above for measurements and observations.    Preliminary     ASSESSMENT AND PLAN: This is a pleasant 80 year old African-American female initially diagnosed with non-small cell lung cancer, adenocarcinoma of the right upper lobe in March 2013.  She was also diagnosed with non-small cell lung cancer, adenocarcinoma of the left upper lobe status post stereotactic radiotherapy to this lesion in 2019.  She developed a new left pulmonary nodule which was positive for adenocarcinoma and another focus in the right lower lobe that is also suspicious for adenocarcinoma which was diagnosed in May 2022.  Molecular studies showed that she is positive for an EGFR mutation.  She was started on Tagrisso 80 mg daily and her first dose was on 12/17/2020.  The patient has had several recent hospitalizations due to peripheral vascular disease.  The patient has missed some of her Tagrisso and unclear how much of this medication she has actually taken.  CT scan results this admission have been reviewed.  There is evidence for disease progression also concern for inflammation.  This CT scan was compared to a scan from March 2022.  I discussed with the patient's daughter that she has evidence of disease progression from her lung cancer.  However, she has not been on treatment for very long and did miss some of her medication.  Difficult to say if the Newman Nip is not effective.  Given the inflammation seen in her lungs on CT scan, recommend holding Tagrisso and starting her on steroids with prednisone 1 mg/kg.  I have ordered  Solu-Medrol 40 mg twice daily as the patient cannot swallow pills at this time.  We  will transition to p.o. when she is able to tolerate.  Lab work reviewed and she also has evidence of DIC.  I discussed with hospitalist and requested PCCM assistance with management.   The patient has multiple acute medical issues at this time on top of her underlying incurable malignancy.  From our standpoint, she would not be a candidate for systemic chemotherapy due to poor performance status.  Dr. Julien Nordmann would consider restarting her on targeted therapy as an outpatient if she improves from this hospitalization.  Agree with palliative care consult to assist in discussing goals of care with the patient and her family.    LOS: 1 day   Mikey Bussing, DNP, AGPCNP-BC, AOCNP 01/26/21

## 2021-01-26 NOTE — Consult Note (Signed)
NAME:  Heather [name], MRN:  [id number], DOB:  10/01/1940, LOS: 1 ADMISSION DATE:  01/18/2021, CONSULTATION DATE:  7/21 REFERRING MD:  Dr. Grandville Silos, CHIEF COMPLAINT:  Shortness of Breath  History of Present Illness:  80 y/o F who presented to [facility] on 7/20 with reports of shortness of breath.   The patient has been most recently living at [facility] after an admission to [facility] from 7/5-7/15 for PAD with LLE ischemic necrosis of the her last two toes.  The patient underwent angiogram for critical limb ischemia on 7/7 and subsequent amputation on 7/13 of her last two toes on the left foot.  She was transferred to SNF on 7/15 for rehab efforts.  It is noted that during that admission she was documented as profoundly deconditioned.   EMS was called at her SNF due to reports of shortness of breath that began 7/20 am.  Her oxygen was increased to 6L and subsequently decreased but the facility felt she should be evaluated and requested EMS transport.  On arrival, she was noted to be altered but in no distress.  EKG showed AFwRVR.  VBG 7.42 / CO2 48.  COVID / influenza testing were negative.  CXR showed cardiomegaly, bilateral pleural effusions, pulmonary edema.  She had LLE swelling and an ultrasound was assessed which was positive for LE DVT.  She had a mild troponin leak thought related to respiratory distress.  Heparin gtt was initiated. CTA of the chest was evaluated to rule out PE and was negative. However, imaging did show an increase in the size of the L apical spiculated pulmonary mass consistent with lung cancer, increase in diffuse patchy ground glass opacities, trace effusions, enlarged pulmonary artery suggestive of PH, cardiomegaly and known abdominal aortic aneurysm (poorly visualized). EKG showed atrial flutter with PVC's.  She was admitted per [facility] for further evaluation.  She was placed on BiPAP for work of breathing but became nauseated and it was removed.  Additional lab work notable for  possible DIC.  PCCM consulted for pulmonary evaluation.    Pertinent  Medical History  Aflutter  CHF  Ascending Aortic Aneurysm PVD - left toe amputations 2022 HTN  COPD - on 2L O2 at baseline  Non-Squamous Non-Small Cell - stage 1B s/p RUL lobectomy 2013, recurrence of disease in LUL s/p stereotatic therapy in 2019, RLL recurrence in 11/2020 -now stage 4 Former Tobacco Abuse   Significant Hospital Events: Including procedures, antibiotic start and stop dates in addition to other pertinent events   7/20 Admit 7/21 PCCM consulted for pulmonary evaluation, assessment of DIC  Interim History / Subjective:  As above  Objective   Blood pressure (!) 150/75, pulse 82, temperature 98.3 F (36.8 C), temperature source Axillary, resp. rate (!) 36, height 5\' 7"  (1.702 m), weight 70 kg, SpO2 100 %.    FiO2 (%):  [40 %] 40 %   Intake/Output Summary (Last 24 hours) at 01/26/2021 1014 Last data filed at 01/26/2021 0830 Gross per 24 hour  Intake 673.44 ml  Output --  Net 673.44 ml   Filed Weights   01/24/2021 1604  Weight: 70 kg    Examination: General: elderly adult female lying in bed in NAD HEENT: MM pink/moist, anicteric, [place] O2 Neuro: Awake, alert, answers simple questions at times, confused intermittently, generalized weakness CV: s1s2 irr irr, AF on monitor, no m/r/g PULM: mild abdominal accessory muscle use, lungs coarse bilaterally with occasional rhonchi GI: soft, bsx4 active  Extremities: warm/dry, trace LE edema, left  foot wrapped, dusky 2nd/3rd toe with cap refill >4 seconds/sluggish  Skin: no rashes or lesions  Resolved Hospital Problem list     Assessment & Plan:   Acute on Chronic Hypoxic Respiratory Failure  COPD  Multifactorial in the setting of underlying COPD, recurrent adenocarcinoma of the lung, deconditioning and malnutrition.  Increase in size of spiculated left apical mass, ground glass opacities on CT. Negative for PE.  -continue empiric therapy for  possible CAP > rocephin + azithro  -pulmonary hygiene - IS, mobilize -solumedrol 40 mg BID  -O2 to support sats > 90%  -follow intermittent CXR   Recurrent Adenocarcinoma of the Lung  Now considered to be stage IV on Tagrisso  -defer therapy options to [place] -hold home Arial.   AF/Flutter -tele monitoring  -cardizem gtt   Acute DVT LLE  -continue anticoagulation  -PPI while on anticoagulation   DIC  Suspect related to underlying malignancy, but must also consider known aneurysm, and sepsis.  Doubt HIT due to timing of heparin administration window.  COVID negative.  PCT negative.  -supportive care -continue heparin for now -follow up cultures  -abx as above   Best Practice (right click and "Reselect all SmartList Selections" daily)   Diet/type: NPO DVT prophylaxis: systemic heparin GI prophylaxis: PPI Lines: N/A Foley:  N/A Code Status:  full code Last date of multidisciplinary goals of care discussion:  Reviewed patients medical history with daughter, current respiratory burden, DIC, dusky toes and AF/flutter.  We reviewed that most likely, her current issues are cumulative from recent illnesses with prolonged immobilization, poor intake, surgery and recurrent lung cancer.  She indicates the patient has previously expressed that she would not want to be on mechanical ventilation.  Her daughter is hopeful she will recover but understands that she may be in a difficult place where she will not be able to recover & this may mean end of life.  We discussed the concept of proceeding forward with all interventions that do not come with great discomfort for Heather [name].  We also discussed no CPR / no ventilation and Heather [name] notes that she will need to speak to her family (patient has 3 brothers and 2 other sons).     Labs   CBC: Recent Labs  Lab 01/24/21 0958 01/13/2021 1406 01/08/2021 2100 01/26/21 0100  WBC 15.8* 15.2*  --  17.9*  NEUTROABS 12.1* 10.7*  --   --   HGB 10.5*  11.5*  --  10.7*  HCT 30.5* 35.3*  --  32.5*  MCV 91.3 95.4  --  95.3  PLT 98* 73* 76* 72*    Basic Metabolic Panel: Recent Labs  Lab 01/24/21 0958 01/26/2021 1406 01/26/21 0100 01/26/21 0855  NA 146* 146* 146* 147*  K 3.1* 3.5 3.3* 3.5  CL 103 103 105 107  CO2 28 29 29 30   GLUCOSE 147* 141* 118* 99  BUN 34* 48* 49* 53*  CREATININE 0.95 1.11* 1.24* 1.35*  CALCIUM 10.1 10.8* 9.9 9.6   GFR: Estimated Creatinine Clearance: 32.3 mL/min (A) (by C-G formula based on SCr of 1.35 mg/dL (H)). Recent Labs  Lab 01/24/21 0958 01/30/2021 1406 01/19/2021 2056 01/30/2021 2100 01/26/21 0100  PROCALCITON  --   --   --  <0.10  --   WBC 15.8* 15.2*  --   --  17.9*  LATICACIDVEN  --   --  2.1*  --  2.7*    Liver Function Tests: Recent Labs  Lab 01/24/21 0958 02/04/2021 1406 01/26/21  0855  AST 55* 58* 52*  ALT 58* 66* 48*  ALKPHOS 202* 219* 156*  BILITOT 1.3* 1.5* 0.9  PROT 6.5 7.2 5.6*  ALBUMIN 3.2* 3.8 3.0*   No results for input(s): LIPASE, AMYLASE in the last 168 hours. No results for input(s): AMMONIA in the last 168 hours.  ABG    Component Value Date/Time   PHART 7.438 (H) 09/25/2011 0423   PCO2ART 42.3 09/25/2011 0423   PO2ART 72.4 (L) 09/25/2011 0423   HCO3 30.6 (H) 01/16/2021 1406   TCO2 29.4 09/25/2011 0423   O2SAT 17.0 01/09/2021 1406     Coagulation Profile: Recent Labs  Lab 01/08/2021 2100  INR 3.2*    Cardiac Enzymes: No results for input(s): CKTOTAL, CKMB, CKMBINDEX, TROPONINI in the last 168 hours.  HbA1C: No results found for: HGBA1C  CBG: No results for input(s): GLUCAP in the last 168 hours.  Review of Systems:   Patient unable to complete due to confusion. Information obtained from prior documentation, family and staff.   Past Medical History:  She,  has a past medical history of Arthritis, Ascending aortic aneurysm ([place]) (07/20/2014), Atrial fib/flutter, transient, CHF (congestive heart failure) ([place]), Constipation, Dysrhythmia, Floater,  vitreous, Hypertension, Nonsquamous nonsmall cell neoplasm of lung ([place]) (09/2011), Pneumonia, Psoriasis, and Tobacco abuse.   Surgical History:   Past Surgical History:  Procedure Laterality Date   ABDOMINAL AORTOGRAM W/LOWER EXTREMITY N/A 01/12/2021   Procedure: ABDOMINAL AORTOGRAM W/LOWER EXTREMITY;  Surgeon: Marty Heck, MD;  Location: [place] [facility];  Service: Cardiovascular;  Laterality: N/A;   AMPUTATION Left 01/18/2021   Procedure: LEFT FOURTH AND FIFTH TOE AMPUTATION;  Surgeon: Cherre Robins, MD;  Location: [place];  Service: Vascular;  Laterality: Left;   CATARACT EXTRACTION Bilateral 2016   COLONOSCOPY  01/22/2012   Procedure: COLONOSCOPY;  Surgeon: Danie Binder, MD;  [facility];  Service: Endoscopy;  Laterality: N/A;  10:45   COLONOSCOPY N/A 04/15/2017   Four 4-6 mm polyps in descending colon, ascending, and cecum. One 15 mm polyp in proximal ascending colon, one 4 mm poylp at hepatic flexure, two 3-4 mm polyps in sigmoid and descending. Internal hemorrhoids. Colonoscopy in 3-5 years if benefits outweigh risks. Simple adenomas.    IR RADIOLOGIST EVAL & MGMT  03/04/2018   PERIPHERAL VASCULAR INTERVENTION Left 01/12/2021   Procedure: PERIPHERAL VASCULAR INTERVENTION;  Surgeon: Marty Heck, MD;  Location: [place] [facility];  Service: Cardiovascular;  Laterality: Left;  sfa   POLYPECTOMY  04/15/2017   Procedure: POLYPECTOMY;  Surgeon: Danie Binder, MD;  [facility];  Service: Endoscopy;;  ascending colon   RADIOLOGY WITH ANESTHESIA N/A 03/19/2018   Procedure: CT MICROWAVE THERMAL ABLATION WITH ANESTHESIA;  Surgeon: Jacqulynn Cadet, MD;  Location: [facility];  Service: Anesthesiology;  Laterality: N/A;   right VATS (upper lobectomy)  08/29/11   Dr Roxan Hockey   VIDEO BRONCHOSCOPY WITH ENDOBRONCHIAL NAVIGATION N/A 03/18/2015   Procedure: VIDEO BRONCHOSCOPY WITH ENDOBRONCHIAL NAVIGATION;  Surgeon: [name], MD;  Location: [place];   Service: Thoracic;  Laterality: N/A;   VIDEO BRONCHOSCOPY WITH ENDOBRONCHIAL NAVIGATION N/A 11/17/2020   Procedure: VIDEO BRONCHOSCOPY WITH ENDOBRONCHIAL NAVIGATION;  Surgeon: [name], MD;  Location: [place];  Service: Thoracic;  Laterality: N/A;     Social History:   reports that she has been smoking cigarettes. She has a 6.00 pack-year smoking history. She has never used smokeless tobacco. She reports current alcohol use. She reports that she does  not use drugs.   Family History:  Her family history includes Heart disease in her brother, mother, and sister; Hypertension in her mother. There is no history of Colon cancer.   Allergies Allergies  Allergen Reactions   Ibuprofen Nausea And Vomiting and Other (See Comments)    "Allergic," per MAR   Powder Rash and Other (See Comments)    NO gloves with powder- "Allergic," per [facility]     Home Medications  Prior to Admission medications   Medication Sig Start Date End Date Taking? Authorizing Provider  acetaminophen (TYLENOL) 500 MG tablet Take 500 mg by mouth every 6 (six) hours as needed for mild pain.   Yes [provider]  alendronate (FOSAMAX) 70 MG tablet Take 70 mg by mouth every Monday.  07/06/14  Yes [provider]  aspirin EC 81 MG EC tablet Take 1 tablet (81 mg total) by mouth daily. Swallow whole. 01/15/21  Yes Setzer, Edman Circle, PA-C  atorvastatin (LIPITOR) 40 MG tablet Take 1 tablet (40 mg total) by mouth daily. Patient taking differently: Take 40 mg by mouth at bedtime. 01/15/21  Yes Setzer, Edman Circle, PA-C  clopidogrel (PLAVIX) 75 MG tablet Take 1 tablet (75 mg total) by mouth daily. 01/20/21  Yes Rhyne, Hulen Shouts, PA-C  glucosamine-chondroitin 500-400 MG tablet Take 1 tablet by mouth daily.   Yes [provider]  hydrochlorothiazide (HYDRODIURIL) 12.5 MG tablet Take 6.25 mg by mouth daily.   Yes [provider]  HYDROcodone-acetaminophen (NORCO/VICODIN) 5-325 MG tablet Take 1 tablet  by mouth every 6 (six) hours as needed. Patient taking differently: Take 1 tablet by mouth every 6 (six) hours as needed (for pain). 01/20/21  Yes Rhyne, Hulen Shouts, PA-C  meclizine (ANTIVERT) 12.5 MG tablet Take 12.5 mg by mouth daily as needed for dizziness.   Yes [provider]  metoprolol succinate (TOPROL-XL) 50 MG 24 hr tablet Take 25 mg by mouth in the morning. Take with or immediately following a meal.   Yes [provider]  Multiple Vitamins-Iron (MULTIVITAMINS WITH IRON) TABS Take 1 tablet by mouth daily.   Yes [provider]  Omega-3 Fatty Acids (FISH OIL) 500 MG CAPS Take 500 mg by mouth daily.    Yes [provider]  osimertinib mesylate (TAGRISSO) 80 MG tablet Take 1 tablet (80 mg total) by mouth daily. 12/15/20  Yes Curt Bears, MD  OXYGEN Inhale 2 L/min into the lungs See admin instructions. "2 L/min every day and night shift to keep O2 sats ABOVE 90%"   Yes [provider]  OYSTER SHELL CALCIUM + D 500-200 MG-UNIT TABS Take 1 tablet by mouth 3 (three) times daily.   Yes [provider]  pantoprazole (PROTONIX) 20 MG tablet Take 20 mg by mouth daily before breakfast.   Yes [provider]  potassium chloride SA (KLOR-CON) 20 MEQ tablet Take 1 tablet (20 mEq total) by mouth daily. 01/24/21  Yes Heilingoetter, Cassandra L, PA-C  prochlorperazine (COMPAZINE) 10 MG tablet Take 1 tablet (10 mg total) by mouth every 6 (six) hours as needed. Patient taking differently: Take 10 mg by mouth every 6 (six) hours as needed for nausea or vomiting. 01/24/21  Yes Heilingoetter, Cassandra L, PA-C  vitamin B-12 (CYANOCOBALAMIN) 500 MCG tablet Take 500 mcg by mouth daily.   Yes [provider]     Critical care time: n/a    Noe Gens, MSN, APRN, NP-C, AGACNP-BC [facility] 01/26/2021, 10:14 AM   Please  see [email or web address] for pager details.   From 7A-7P if no response, please call [phone] After  hours, please call ELink [phone]

## 2021-01-26 NOTE — TOC Initial Note (Signed)
Transition of Care [facility]) - Initial/Assessment Note    Patient Details  Name: Heather [name] MRN: [id number] Date of Birth: Nov 18, 1940  Transition of Care [facility]) CM/SW Contact:    Leeroy Cha, RN Phone Number: 01/26/2021, 8:04 AM  Clinical Narrative:                 Patient is from [facility]  80 y.o. female with medical history significant of NSCLC (adenocarcinoma, stage 4), prior transient PAF not on AC, HTN, PAD with recent amputation of 2 toes on L foot due to necrosis just last week, Chronic systolic CHF with EF 01-65% on prior echos.   Pt presents to ED with SOB, increased O2 requirement (normally on 2L, was requiring up to 6L at facility).   Nursing facility reported that patient began complaining of shortness of breath around 11:45 AM today.  She was initially placed on 6 L and has been able to titrate back down to 2 L but they felt like she was not stable and they sent her to the ED for further evaluation.   Daughter over the phone states that patient has had a progressive decline over the past month but especially over the past 5 days.  Normally she is alert and conversant and has been delirious and not as responsive.  toc plan-to return to [facility]  Expected Discharge Plan: Skilled Nursing Facility Barriers to Discharge: Continued Medical Work up   Patient Goals and CMS Choice Patient states their goals for this hospitalization and ongoing recovery are:: unable to state CMS Medicare.gov Compare Post Acute Care list provided to:: Patient Represenative (must comment) (daughter) Choice offered to / list presented to : Adult Children  Expected Discharge Plan and Services Expected Discharge Plan: [place]   Discharge Planning Services: CM Consult   Living arrangements for the past 2 months: [place]                                      Prior Living Arrangements/Services Living arrangements for the past 2 months: [place] Lives with:: Facility Resident                   Activities of Daily Living Home Assistive Devices/Equipment: None ADL Screening (condition at time of admission) Patient's cognitive ability adequate to safely complete daily activities?: Yes Is the patient deaf or have difficulty hearing?: No Does the patient have difficulty seeing, even when wearing glasses/contacts?: No Does the patient have difficulty concentrating, remembering, or making decisions?: Yes (new since Friday) Patient able to express need for assistance with ADLs?: Yes Does the patient have difficulty dressing or bathing?: Yes Independently performs ADLs?: No Communication: Independent Dressing (OT): Needs assistance Is this a change from baseline?: Change from baseline, expected to last >3 days Grooming: Needs assistance Is this a change from baseline?: Change from baseline, expected to last >3 days Feeding: Needs assistance Is this a change from baseline?: Change from baseline, expected to last >3 days Bathing: Needs assistance Is this a change from baseline?: Change from baseline, expected to last >3 days Toileting: Needs assistance Is this a change from baseline?: Change from baseline, expected to last >3days In/Out Bed: Needs assistance Is this a change from baseline?: Change from baseline, expected to last >3 days Walks in Home: Needs assistance Is this a change from baseline?: Change from baseline, expected to last >  3 days Does the patient have difficulty walking or climbing stairs?: Yes Weakness of Legs: Left (had surgery on left foot last week) Weakness of Arms/Hands: None  Permission Sought/Granted                  Emotional Assessment Appearance:: Appears stated age     Orientation: : Fluctuating Orientation (Suspected and/or reported Sundowners) Alcohol / Substance Use: Not Applicable Psych Involvement: No (comment)  Admission diagnosis:  Atrial fibrillation with rapid  ventricular response (HCC) [I48.91] Acute on chronic respiratory failure with hypoxia (HCC) [J96.21] Acute deep vein thrombosis (DVT) of left peroneal vein (HCC) [I82.452] Acute on chronic congestive heart failure, unspecified heart failure type ([place]) [I50.9] Patient Active Problem List   Diagnosis Date Noted   Acute on chronic respiratory failure with hypoxia ([place]) 02/05/2021   Atrial fibrillation with RVR ([place]) 01/28/2021   Left leg DVT ([place]) 01/31/2021   Decubitus ulcer of sacral region, stage 1 01/16/2021   Acute on chronic systolic CHF (congestive heart failure) ([place]) 01/09/2021   CAP (community acquired pneumonia) 02/05/2021   Atherosclerosis of native artery of left lower extremity with gangrene ([place]) 01/11/2021   Adenocarcinoma of left lung, stage 4 ([place]) 12/15/2020   Prolapsed internal hemorrhoids, grade 3 03/25/2020   Grade III hemorrhoids 11/06/2019   Primary adenocarcinoma of upper lobe of left lung ([place]) 08/07/2017   Malignant neoplasm of upper lobe of left lung ([place]) 08/01/2017   History of adenomatous polyp of colon 02/06/2017   Ascending aortic aneurysm ([place]) 07/20/2014   Non-small cell carcinoma of right lung, stage 1 ([place]) 07/20/2014   Constipation 01/07/2012   Encounter for screening colonoscopy 01/07/2012   Tobacco abuse    Lung nodule 08/21/2011   PCP:  Rosita Fire, MD Pharmacy:  No Pharmacies Listed    Social Determinants of Health (SDOH) Interventions    Readmission Risk Interventions No flowsheet data found.

## 2021-01-26 NOTE — Progress Notes (Signed)
    OVERNIGHT PROGRESS REPORT  Notified by RN of lab value and replacement electrolyte ordered.  Start time is delayed due to family member not allowing IVPB by RN to start until 0600.   Gershon Cull MSNA MSN ACNPC-AG Acute Care Nurse Practitioner [place]

## 2021-01-26 NOTE — Consult Note (Signed)
[facility] Nurse Consult Note: Reason for Consult:Stage 2 pressure injury to sacrum. Recent amputation to left foot, 4th and 5th digits Wound type: pressure, surgical Pressure Injury POA: Yes Measurement: Bedside RN to measure sacral pressure injury today prior to placement of first dressing, see photo of amputation site Wound bed: pink, moist Drainage (amount, consistency, odor) small serous Periwound:intact Dressing procedure/placement/frequency: Stage 2 pressure injury may be managed using Skin Care Order Set, but I will provide orders for nursing today using an antimicrobial nonadherent dressing (xeroform) and a silicone foam topper to this area with daily changes.  Turning and repositioning will be the cornerstone of the POC.  Dry gauze dressings per Dr. Mora Appl note on 7/15 are placed for Nursing.   [place] nursing team will not follow, but will remain available to this patient, the nursing and medical teams.  Please re-consult if needed. Thanks, Maudie Flakes, MSN, RN, [place], Arther Abbott  Pager# [phone]

## 2021-01-26 NOTE — Progress Notes (Signed)
Initial Nutrition Assessment  DOCUMENTATION CODES:   Non-severe (moderate) malnutrition in context of chronic illness  INTERVENTION:  - will monitor for recommendations from SLP. - will monitor for [place] after family discussion with Palliative Care.    NUTRITION DIAGNOSIS:   Moderate Malnutrition related to chronic illness, cancer and cancer related treatments as evidenced by energy intake < 75% for > or equal to 1 month, moderate muscle depletion.  GOAL:   Other (Comment) (will monitor for GOC)  MONITOR:   Diet advancement, Labs, Weight trends, Skin  REASON FOR ASSESSMENT:   Malnutrition Screening Tool  ASSESSMENT:   80 year old female with medical history of stage 4 NSCLC, prior transient A. fib not on anticoagulation, HTN, PAD with recent amputation of 2 toes on the L foot due to necrosis last week, and CHF. She presented to the ED with worsening SOB, increased O2 requirements (needing at least 6L at facility). She has had progressive decline over the past 1 month. CT angio chest in the ED showed concern for worsening lung cancer; lower extremity dopplers showed LLE DVT. She was started on IV abx d/t possible PNA. Patient placed on BiPAP initially but discontinued d/t nausea and possible emesis. Oncology and Palliative Care have been consulted.  She has been NPO since admission. Able to talk with RN prior to entering patient's room. RN reports that she was told patient was able to take ice chips yesterday, but that today patient does not initial swallow and water dribbles back out of patient's mouth. Patient is not being provided with PO medications today in light of this.   RN reports plan for SLP evaluation and consult has been placed for Palliative Care.   Patient's daughter at bedside and provides all information as patient unable to participate in conversation. Daughter reports that patient had a good appetite at baseline but that over the past 3 weeks she has had recurrent  hospitalizations and that she went to a nursing home on 7/15. Patient has not liked the taste of many foods in the hospital and at the nursing home.   She did not have overt issues chewing or swallowing in the past but now requires very soft foods as dentures are ill fitting and patient also has not wanted to wear them with feeling unwell.   She is able to feed herself at baseline. But more recently has had some difficulty with this.  Daughter feels that, based on physical appearance, that patient has lost 15-20 lbs recently.   Weight yesterday was 154 lb and weight on 5/12 was 162 lb. This indicates 8 lb weight loss (5% body weight) in the past 2 months; not significant for time frame.     Labs reviewed; Na: 147 mmol/l, BUN: 53 mg/dl, creatinine: 1.35 mg/dl, Alk Phos elevated, LFTs slightly elevated, GFR: 40 mg/dl. Medications reviewed; 1000 mcg subcutaneous cyanocobalamin/day, 40 mg IV protonix/day, 10 mEq KCl x3 runs 7/21.    NUTRITION - FOCUSED PHYSICAL EXAM:  Flowsheet Row Most Recent Value  Orbital Region Moderate depletion  Upper Arm Region Moderate depletion  Thoracic and Lumbar Region Unable to assess  Buccal Region Severe depletion  Temple Region Moderate depletion  Clavicle Bone Region Severe depletion  Clavicle and Acromion Bone Region Severe depletion  Scapular Bone Region Severe depletion  Dorsal Hand Mild depletion  Patellar Region No depletion  Anterior Thigh Region No depletion  Posterior Calf Region No depletion  Edema (RD Assessment) Mild  [BLE]  Hair Reviewed  Eyes Reviewed  Mouth  Unable to assess  Skin Reviewed  Nails Reviewed       Diet Order:   Diet Order             Diet NPO time specified Except for: Sips with Meds  Diet effective now                   EDUCATION NEEDS:   Not appropriate for education at this time  Skin:  Skin Assessment: Skin Integrity Issues: Skin Integrity Issues:: Stage II, Incisions Stage II: sacrum Incisions:  L foot (7/13)  Last BM:  PTA/unknown  Height:   Ht Readings from Last 1 Encounters:  01/30/2021 5' 7"  (1.702 m)    Weight:   Wt Readings from Last 1 Encounters:  02/04/2021 70 kg     Estimated Nutritional Needs:  Kcal:  2000-2200 kcal Protein:  110-110 grams Fluid:  >/= 2 L/day     Jarome Matin, MS, RD, LDN, CNSC Inpatient Clinical Dietitian RD pager # available in [place]  After hours/weekend pager # available in [facility]

## 2021-01-26 NOTE — Progress Notes (Addendum)
[place] for IV heparin Indication: New Afib/RVR, + DVT  Allergies  Allergen Reactions   Ibuprofen Nausea And Vomiting and Other (See Comments)    "Allergic," per MAR   Powder Rash and Other (See Comments)    NO gloves with powder- "Allergic," per [facility]    Patient Measurements: Height: 5\' 7"  (170.2 cm) Weight: 70 kg (154 lb 5.2 oz) IBW/kg (Calculated) : 61.6 Heparin Dosing Weight: TBW (used [place] nomogram for initial dosing)  Vital Signs: Temp: 98.3 F (36.8 C) (07/21 0800) Temp Source: Axillary (07/21 0800) BP: 150/75 (07/21 0700) Pulse Rate: 82 (07/21 0700)  Labs: Recent Labs    01/24/21 0958 02/04/2021 1406 01/24/2021 1820 01/16/2021 2100 01/26/21 0100 01/26/21 0855  HGB 10.5* 11.5*  --   --  10.7*  --   HCT 30.5* 35.3*  --   --  32.5*  --   PLT 98* 73*  --  76* 72*  --   APTT  --   --   --  >200*  --  172*  LABPROT  --   --   --  32.6*  --  22.7*  INR  --   --   --  3.2*  --  2.0*  HEPARINUNFRC  --   --   --   --  0.42 0.49  CREATININE 0.95 1.11*  --   --  1.24* 1.35*  TROPONINIHS  --  177* 165*  --   --   --      Estimated Creatinine Clearance: 32.3 mL/min (A) (by C-G formula based on SCr of 1.35 mg/dL (H)).   Medical History: Past Medical History:  Diagnosis Date   Ascending aortic aneurysm ([place]) 07/20/2014   4.1 cm by CT   Atrial fib/flutter, transient    post-op   CHF (congestive heart failure) ([place])    a. EF 30-35% by echo in 2013 --> similar results by repeat imaging in 2016 and 11/2017.   Constipation    corrects with diet   [place], vitreous    Right   Hypertension    Nonsquamous nonsmall cell neoplasm of lung ([place]) 09/2011   Stage IB- s/p right upper lobectomy   Pneumonia    Psoriasis    Ankles   Tobacco abuse    Scheduled:   atorvastatin  40 mg Oral QHS   Chlorhexidine Gluconate Cloth  6 each Topical Daily   mouth rinse  15 mL Mouth Rinse BID   metoprolol succinate  25 mg Oral Daily    multivitamins with iron  1 tablet Oral Daily   omega-3 acid ethyl esters  1 g Oral Daily   pantoprazole  20 mg Oral QAC breakfast   potassium chloride  40 mEq Oral Once   vitamin B-12  500 mcg Oral Daily    Assessment: 15 yoF with PMH HTN, transient Afib/flutter, AAA, lung cancer on oral chemo, who was recently admitted 7/5 - 7/15 for amputation of ischemic L 5th toe and subsequently sent to rehab, now presenting with SOB and found to be in [place].   BLE doppler positive for L peroneal DVT;  CTA : negative PE.   Baseline INR, aPTT: not done, aPTTs resulted after Heparin started Prior anticoagulation: none, does take ASA + Plavix PTA Baseline INR elevated 3.2  Today, 01/26/2021: 0100 Heparin level = 0.42 (therapeutic) with heparin gtt @ 1000 untis/hr CBC: Hgb low-stable, Plt decr to 72K (182 on 7/13) SCr inc to 1.35 Noted hematoma RUE where  IV site pulled 7/20, no further bleed S/P 7/13 L toe amputations, 4th & 5th: RN noted some oozing  Goal of Therapy: Heparin level 0.3-0.7 units/ml Monitor platelets by anticoagulation protocol: Yes  Plan: Continue Heparin 1000 units/hr IV infusion Daily CBC, daily heparin level once stable Monitor for signs of bleeding or thrombosis, watch Plt closely  Minda Ditto PharmD [facility] Rx [phone] 01/26/2021, 10:55 AM

## 2021-01-27 DIAGNOSIS — I82452 Acute embolism and thrombosis of left peroneal vein: Secondary | ICD-10-CM | POA: Diagnosis not present

## 2021-01-27 DIAGNOSIS — J9621 Acute and chronic respiratory failure with hypoxia: Secondary | ICD-10-CM | POA: Diagnosis not present

## 2021-01-27 DIAGNOSIS — D65 Disseminated intravascular coagulation [defibrination syndrome]: Secondary | ICD-10-CM | POA: Diagnosis not present

## 2021-01-27 DIAGNOSIS — Z66 Do not resuscitate: Secondary | ICD-10-CM

## 2021-01-27 DIAGNOSIS — C3492 Malignant neoplasm of unspecified part of left bronchus or lung: Secondary | ICD-10-CM | POA: Diagnosis not present

## 2021-01-27 DIAGNOSIS — I5023 Acute on chronic systolic (congestive) heart failure: Secondary | ICD-10-CM | POA: Diagnosis not present

## 2021-01-27 DIAGNOSIS — I4891 Unspecified atrial fibrillation: Secondary | ICD-10-CM | POA: Diagnosis not present

## 2021-01-27 DIAGNOSIS — I509 Heart failure, unspecified: Secondary | ICD-10-CM | POA: Diagnosis not present

## 2021-01-27 DIAGNOSIS — Z515 Encounter for palliative care: Secondary | ICD-10-CM

## 2021-01-27 LAB — CBC WITH DIFFERENTIAL/PLATELET
Abs Immature Granulocytes: 0.17 10*3/uL — ABNORMAL HIGH (ref 0.00–0.07)
Basophils Absolute: 0 10*3/uL (ref 0.0–0.1)
Basophils Relative: 0 %
Eosinophils Absolute: 0 10*3/uL (ref 0.0–0.5)
Eosinophils Relative: 0 %
HCT: 29.3 % — ABNORMAL LOW (ref 36.0–46.0)
Hemoglobin: 9.5 g/dL — ABNORMAL LOW (ref 12.0–15.0)
Immature Granulocytes: 1 %
Lymphocytes Relative: 21 %
Lymphs Abs: 3.7 10*3/uL (ref 0.7–4.0)
MCH: 31.4 pg (ref 26.0–34.0)
MCHC: 32.4 g/dL (ref 30.0–36.0)
MCV: 96.7 fL (ref 80.0–100.0)
Monocytes Absolute: 0.3 10*3/uL (ref 0.1–1.0)
Monocytes Relative: 2 %
Neutro Abs: 13.6 10*3/uL — ABNORMAL HIGH (ref 1.7–7.7)
Neutrophils Relative %: 76 %
Platelets: 66 10*3/uL — ABNORMAL LOW (ref 150–400)
RBC: 3.03 MIL/uL — ABNORMAL LOW (ref 3.87–5.11)
RDW: 17.6 % — ABNORMAL HIGH (ref 11.5–15.5)
WBC: 17.8 10*3/uL — ABNORMAL HIGH (ref 4.0–10.5)
nRBC: 2.2 % — ABNORMAL HIGH (ref 0.0–0.2)

## 2021-01-27 LAB — COMPREHENSIVE METABOLIC PANEL
ALT: 44 U/L (ref 0–44)
AST: 43 U/L — ABNORMAL HIGH (ref 15–41)
Albumin: 2.8 g/dL — ABNORMAL LOW (ref 3.5–5.0)
Alkaline Phosphatase: 144 U/L — ABNORMAL HIGH (ref 38–126)
Anion gap: 13 (ref 5–15)
BUN: 53 mg/dL — ABNORMAL HIGH (ref 8–23)
CO2: 26 mmol/L (ref 22–32)
Calcium: 9 mg/dL (ref 8.9–10.3)
Chloride: 109 mmol/L (ref 98–111)
Creatinine, Ser: 1.29 mg/dL — ABNORMAL HIGH (ref 0.44–1.00)
GFR, Estimated: 42 mL/min — ABNORMAL LOW (ref >60)
Glucose, Bld: 118 mg/dL — ABNORMAL HIGH (ref 70–99)
Potassium: 3.8 mmol/L (ref 3.5–5.1)
Sodium: 148 mmol/L — ABNORMAL HIGH (ref 135–145)
Total Bilirubin: 1 mg/dL (ref 0.3–1.2)
Total Protein: 5.5 g/dL — ABNORMAL LOW (ref 6.5–8.1)

## 2021-01-27 LAB — HAPTOGLOBIN: Haptoglobin: 24 mg/dL — ABNORMAL LOW (ref 42–346)

## 2021-01-27 LAB — PHOSPHORUS
Phosphorus: 3.7 mg/dL (ref 2.5–4.6)
Phosphorus: 3.2 mg/dL (ref 2.5–4.6)

## 2021-01-27 LAB — HEPARIN LEVEL (UNFRACTIONATED): Heparin Unfractionated: 0.6 IU/mL (ref 0.30–0.70)

## 2021-01-27 LAB — MAGNESIUM: Magnesium: 2.3 mg/dL (ref 1.7–2.4)

## 2021-01-27 MED ORDER — BUDESONIDE 0.5 MG/2ML IN SUSP
0.5000 mg | Freq: Two times a day (BID) | RESPIRATORY_TRACT | Status: DC
  Administered 2021-01-27 – 2021-01-29 (×5): 0.5 mg via RESPIRATORY_TRACT
  Filled 2021-01-27 (×5): qty 2

## 2021-01-27 MED ORDER — IPRATROPIUM BROMIDE 0.02 % IN SOLN
0.5000 mg | Freq: Four times a day (QID) | RESPIRATORY_TRACT | Status: DC
  Administered 2021-01-27 – 2021-01-29 (×7): 0.5 mg via RESPIRATORY_TRACT
  Filled 2021-01-27 (×9): qty 2.5

## 2021-01-27 MED ORDER — POTASSIUM CHLORIDE 20 MEQ PO PACK
20.0000 meq | PACK | Freq: Once | ORAL | Status: AC
  Administered 2021-01-27: 20 meq via ORAL
  Filled 2021-01-27: qty 1

## 2021-01-27 MED ORDER — DIGOXIN 0.25 MG/ML IJ SOLN
0.2500 mg | Freq: Once | INTRAMUSCULAR | Status: AC
  Administered 2021-01-27: 0.25 mg via INTRAVENOUS
  Filled 2021-01-27: qty 2

## 2021-01-27 MED ORDER — DIGOXIN 0.25 MG/ML IJ SOLN
0.1250 mg | Freq: Every day | INTRAMUSCULAR | Status: DC
  Administered 2021-01-29: 0.125 mg via INTRAVENOUS
  Filled 2021-01-27: qty 2

## 2021-01-27 MED ORDER — PROSOURCE TF PO LIQD: 45.0000 mL | Freq: Two times a day (BID) | ORAL | Status: DC

## 2021-01-27 MED ORDER — ARFORMOTEROL TARTRATE 15 MCG/2ML IN NEBU
15.0000 ug | INHALATION_SOLUTION | Freq: Two times a day (BID) | RESPIRATORY_TRACT | Status: DC
  Administered 2021-01-27 – 2021-01-29 (×5): 15 ug via RESPIRATORY_TRACT
  Filled 2021-01-27 (×5): qty 2

## 2021-01-27 MED ORDER — LORAZEPAM 0.5 MG PO TABS
0.2500 mg | ORAL_TABLET | Freq: Four times a day (QID) | ORAL | Status: DC | PRN
  Administered 2021-01-27 – 2021-01-28 (×3): 0.25 mg via ORAL
  Filled 2021-01-27 (×4): qty 1

## 2021-01-27 MED ORDER — DIGOXIN 0.25 MG/ML IJ SOLN
0.1250 mg | Freq: Three times a day (TID) | INTRAMUSCULAR | Status: AC
  Administered 2021-01-27 – 2021-01-28 (×2): 0.125 mg via INTRAVENOUS
  Filled 2021-01-27 (×2): qty 2

## 2021-01-27 MED ORDER — OSMOLITE 1.5 CAL PO LIQD
1000.0000 mL | ORAL | Status: DC
  Filled 2021-01-27 (×2): qty 1000

## 2021-01-27 MED ORDER — LIDOCAINE HCL URETHRAL/MUCOSAL 2 % EX GEL
1.0000 "application " | Freq: Once | CUTANEOUS | Status: AC
  Administered 2021-01-27: 1
  Filled 2021-01-27: qty 5

## 2021-01-27 MED ORDER — VITAL HIGH PROTEIN PO LIQD: 1000.0000 mL | ORAL | Status: DC

## 2021-01-27 MED ORDER — DEXTROSE 5 % IV SOLN: INTRAVENOUS | Status: DC

## 2021-01-27 MED ORDER — LEVALBUTEROL HCL 0.63 MG/3ML IN NEBU
0.6300 mg | INHALATION_SOLUTION | Freq: Four times a day (QID) | RESPIRATORY_TRACT | Status: DC
  Administered 2021-01-27 – 2021-01-29 (×7): 0.63 mg via RESPIRATORY_TRACT
  Filled 2021-01-27 (×9): qty 3

## 2021-01-27 NOTE — TOC Progression Note (Signed)
Transition of Care [facility]) - Progression Note    Patient Details  Name: Heather [name] MRN: [id number] Date of Birth: 1941/01/21  [facility] CM/SW Contact  Leeroy Cha, RN Phone Number: 01/27/2021, 8:06 AM  Clinical Narrative:    80 year old female history of non-small cell lung cancer, stage IV, prior transient paroxysmal A. fib not on anticoagulation, hypertension, PAD with recent amputation of 2 toes on the left foot due to necrosis last week, chronic systolic CHF with EF 30 to 35% on prior 2D echo presented to the ED with worsening shortness of breath, increased O2 requirements and requiring at least 6 L of O2 at facility.  Patient had presented with worsening shortness of breath.  Patient noted to have a progressive decline over the past month.  Work-up in the ED including CT angiogram chest concerning for worsening non-small cell lung cancer, lower extremity Dopplers done concerning for left lower extremity peroneal DVT and patient started on heparin, DIC panel obtained concerning for DIC.  Patient on Cardizem drip.  Patient placed on empiric IV antibiotics for possible pneumonia.  Patient with worsening respiratory status, trial of BiPAP done on admission discontinued due to nausea and possible emesis.  Oncology consulted.  We will consult with PCCM due to worsening respiratory status, impending DIC.  Palliative care consultation for goals of care.  TOC PLAN OF CARE: Will follow per the above for palliative care needs.  Expected Discharge Plan: [place] Barriers to Discharge: Continued Medical Work up  Expected Discharge Plan and Services Expected Discharge Plan: [place]   Discharge Planning Services: CM Consult   Living arrangements for the past 2 months: [place]                                       Social Determinants of Health (SDOH) Interventions    Readmission Risk Interventions No flowsheet  data found.

## 2021-01-27 NOTE — Consult Note (Addendum)
Cardiology Consultation:   Patient ID: KHUSHBU PIPPEN MRN: [id number]; DOB: [birth year]  Admit date: 01/10/2021 Date of Consult: 01/27/2021  PCP:  Rosita Fire, [facility] Providers Cardiologist:  Carlyle Dolly, MD        Patient Profile:   MASIAH LEWING is a 80 y.o. female with a hx of chronic combined CHF, Hx PAF not on anticoag, HTN, [facility] [place] [facility] CA s/p RUL lobectomy 2013, [facility] CA dx LUL 2019, PAD, who is being seen 01/27/2021 for the evaluation of CHF, w/ decreased EF, atrial flutter, at the request of Dr Grandville Silos.  History of Present Illness:   Ms. 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 Some Afib noted during that admit, on BB. On 07/15, d/c to SNF on O2.  She was admitted 07/21 w/ acute on chronic resp failure, partly due to CHF exacerbation, lung CA may be worsening on CT but no PE, COPD. On ABX for PNA. Did not tolerate BiPAP.  LLE DVT >> heparin. Plt decreased to 66, decreased fibrinogen, increased PTT, elevated d-dimer at 17.35, concerning for DIC >> steroids. Lactate 2.4 >> 2.7  PCCM, Cards and Palliative Care consulted.  Ms. Cassetta is able to answer simple questions.  However she seems generally confused and cannot speak in sentences.  No family members are present.  Other than the very limited information she was able to provide, information was obtained from chart notes and staff.  Ms. Batchelder denies chest pain.  She denies shortness of breath.  Her left foot is painful.  According to the nursing staff, she is confused at baseline.   Past Medical History:  Diagnosis Date   Ascending aortic aneurysm ([place]) 07/20/2014   4.1 cm by CT   Atrial fib/flutter, transient    post-op   CHF (congestive heart failure) ([place])    a. EF 30-35% by echo in 2013 --> similar results by repeat imaging in 2016 and 11/2017.   Constipation    corrects with diet   Floater, vitreous    Right    Hypertension    Nonsquamous nonsmall cell neoplasm of lung ([place]) 09/2011   Stage IB- s/p right upper lobectomy   Pneumonia    Psoriasis    Ankles   Tobacco abuse     Past Surgical History:  Procedure Laterality Date   ABDOMINAL AORTOGRAM W/LOWER EXTREMITY N/A 01/12/2021   Procedure: ABDOMINAL AORTOGRAM W/LOWER EXTREMITY;  Surgeon: Marty Heck, MD;  Location: [place] [facility];  Service: Cardiovascular;  Laterality: N/A;   AMPUTATION Left 01/18/2021   Procedure: LEFT FOURTH AND FIFTH TOE AMPUTATION;  Surgeon: Cherre Robins, MD;  Location: [place];  Service: Vascular;  Laterality: Left;   CATARACT EXTRACTION Bilateral 2016   COLONOSCOPY  01/22/2012   Procedure: COLONOSCOPY;  Surgeon: Danie Binder, MD;  [facility];  Service: Endoscopy;  Laterality: N/A;  10:45   COLONOSCOPY N/A 04/15/2017   Four 4-6 mm polyps in descending colon, ascending, and cecum. One 15 mm polyp in proximal ascending colon, one 4 mm poylp at hepatic flexure, two 3-4 mm polyps in sigmoid and descending. Internal hemorrhoids. Colonoscopy in 3-5 years if benefits outweigh risks. Simple adenomas.    IR RADIOLOGIST EVAL & MGMT  03/04/2018   PERIPHERAL VASCULAR INTERVENTION Left 01/12/2021   Procedure: PERIPHERAL VASCULAR INTERVENTION;  Surgeon: Marty Heck, MD;  Location: [place] [facility];  Service:  Cardiovascular;  Laterality: Left;  sfa   POLYPECTOMY  04/15/2017   Procedure: POLYPECTOMY;  Surgeon: Danie Binder, MD;  [facility];  Service: Endoscopy;;  ascending colon   RADIOLOGY WITH ANESTHESIA N/A 03/19/2018   Procedure: CT MICROWAVE THERMAL ABLATION WITH ANESTHESIA;  Surgeon: Jacqulynn Cadet, MD;  Location: [facility];  Service: Anesthesiology;  Laterality: N/A;   right VATS (upper lobectomy)  08/29/11   Dr Roxan Hockey   VIDEO BRONCHOSCOPY WITH ENDOBRONCHIAL NAVIGATION N/A 03/18/2015   Procedure: VIDEO BRONCHOSCOPY WITH ENDOBRONCHIAL NAVIGATION;  Surgeon: [name],  MD;  Location: [place];  Service: Thoracic;  Laterality: N/A;   VIDEO BRONCHOSCOPY WITH ENDOBRONCHIAL NAVIGATION N/A 11/17/2020   Procedure: VIDEO BRONCHOSCOPY WITH ENDOBRONCHIAL NAVIGATION;  Surgeon: [name], MD;  Location: [place];  Service: Thoracic;  Laterality: N/A;     Home Medications:  Prior to Admission medications   Medication Sig Start Date Taking? Authorizing Provider  acetaminophen (TYLENOL) 500 MG tablet Take 500 mg by mouth every 6 (six) hours as needed for mild pain.  Yes [provider]  alendronate (FOSAMAX) 70 MG tablet Take 70 mg by mouth every Monday.  07/06/14 Yes [provider]  aspirin EC 81 MG EC tablet Take 1 tablet (81 mg total) by mouth daily. Swallow whole. 01/15/21 Yes Setzer, Edman Circle, PA-C  atorvastatin (LIPITOR) 40 MG tablet Take 1 tablet (40 mg total) by mouth daily. Patient taking differently: Take 40 mg by mouth at bedtime. 01/15/21 Yes Setzer, Edman Circle, PA-C  clopidogrel (PLAVIX) 75 MG tablet Take 1 tablet (75 mg total) by mouth daily. 01/20/21 Yes Rhyne, Hulen Shouts, PA-C  glucosamine-chondroitin 500-400 MG tablet Take 1 tablet by mouth daily.  Yes [provider]  hydrochlorothiazide (HYDRODIURIL) 12.5 MG tablet Take 6.25 mg by mouth daily.  Yes [provider]  HYDROcodone-acetaminophen (NORCO/VICODIN) 5-325 MG tablet Take 1 tablet by mouth every 6 (six) hours as needed. Patient taking differently: Take 1 tablet by mouth every 6 (six) hours as needed (for pain). 01/20/21 Yes Rhyne, Hulen Shouts, PA-C  meclizine (ANTIVERT) 12.5 MG tablet Take 12.5 mg by mouth daily as needed for dizziness.  Yes [provider]  metoprolol succinate (TOPROL-XL) 50 MG 24 hr tablet Take 25 mg by mouth in the morning. Take with or immediately following a meal.  Yes [provider]  Multiple Vitamins-Iron (MULTIVITAMINS WITH IRON) TABS Take 1 tablet by mouth daily.  Yes [provider]  Omega-3 Fatty Acids (FISH  OIL) 500 MG CAPS Take 500 mg by mouth daily.   Yes [provider]  osimertinib mesylate (TAGRISSO) 80 MG tablet Take 1 tablet (80 mg total) by mouth daily. 12/15/20 Yes Curt Bears, MD  OXYGEN Inhale 2 L/min into the lungs See admin instructions. "2 L/min every day and night shift to keep O2 sats ABOVE 90%"  Yes [provider]  OYSTER SHELL CALCIUM + D 500-200 MG-UNIT TABS Take 1 tablet by mouth 3 (three) times daily.  Yes [provider]  pantoprazole (PROTONIX) 20 MG tablet Take 20 mg by mouth daily before breakfast.  Yes [provider]  potassium chloride SA (KLOR-CON) 20 MEQ tablet Take 1 tablet (20 mEq total) by mouth daily. 01/24/21 Yes Heilingoetter, Cassandra L, PA-C  prochlorperazine (COMPAZINE) 10 MG tablet Take 1 tablet (10 mg total) by mouth every 6 (six) hours as needed. Patient taking differently: Take 10 mg by mouth every 6 (six) hours as needed for nausea or vomiting. 01/24/21 Yes Heilingoetter,  Cassandra L, PA-C  vitamin B-12 (CYANOCOBALAMIN) 500 MCG tablet Take 500 mcg by mouth daily.  Yes [provider]    Inpatient Medications: Scheduled Meds:  atorvastatin  40 mg Oral QHS   Chlorhexidine Gluconate Cloth  6 each Topical Daily   cyanocobalamin  1,000 mcg Subcutaneous Daily   mouth rinse  15 mL Mouth Rinse BID   methylPREDNISolone (SOLU-MEDROL) injection  40 mg Intravenous Q12H   metoprolol tartrate  12.5 mg Oral BID   pantoprazole (PROTONIX) IV  40 mg Intravenous Q24H   Continuous Infusions:  azithromycin Stopped (01/27/21 0033)   cefTRIAXone (ROCEPHIN)  IV Stopped (01/26/21 2245)   dextrose 50 mL/hr at 01/27/21 0910   diltiazem (CARDIZEM) infusion 2.5 mg/hr (01/27/21 0600)   heparin 1,000 Units/hr (01/27/21 0600)   PRN Meds: acetaminophen, HYDROcodone-acetaminophen, prochlorperazine  Allergies:    Allergies  Allergen Reactions   Ibuprofen Nausea And Vomiting and Other (See Comments)    "Allergic," per [facility]    Powder Rash and Other (See Comments)    NO gloves with powder- "Allergic," per [facility]    Social History:   Social History   Socioeconomic History   Marital status: Widowed    Spouse name: Not on file   Number of children: 3   Years of education: Not on file   Highest education level: Not on file  Occupational History   Occupation: retired  Tobacco Use   Smoking status: Some Days    Packs/day: 0.30    Years: 20.00    Pack years: 6.00    Types: Cigarettes    Last attempt to quit: 01/12/2014    Years since quitting: 7.0   Smokeless tobacco: Never   Tobacco comments:    11/16/20: Per pt, she smokes 2pk per week.  Vaping Use   Vaping Use: Never used  Substance and Sexual Activity   Alcohol use: Yes    Comment: occasional    Drug use: No   Sexual activity: Not on file  Other Topics Concern   Not on file  Social History Narrative   Lives w/ cousin   Social Determinants of Health   Financial Resource Strain: Not on file  Food Insecurity: Not on file  Transportation Needs: Not on file  Physical Activity: Not on file  Stress: Not on file  Social Connections: Not on file  Intimate Partner Violence: Not on file    Family History:    Family History  Problem Relation Age of Onset   Hypertension Mother    Heart disease Mother    Heart disease Sister    Heart disease Brother    Colon cancer Neg Hx      ROS:  Please see the history of present illness.  No other information obtainable  Physical Exam/Data:   Vitals:   01/27/21 0600 01/27/21 0700 01/27/21 0746 01/27/21 0814  BP: 104/81 95/68    Pulse: 84 81    Resp: (!) 34 (!) 29    Temp:   (!) 97.3 F (36.3 C)   TempSrc:   Axillary   SpO2: 97% 100%    Weight:    68.3 kg  Height:        Intake/Output Summary (Last 24 hours) at 01/27/2021 1003 Last data filed at 01/27/2021 0600 Gross per 24 hour  Intake 871.02 ml  Output 650 ml  Net 221.02 ml   Last 3 Weights 01/27/2021 02/01/2021 01/24/2021  Weight (lbs) 150 lb  9.2 oz 154 lb 5.2 oz (No Data)  Weight (kg) 68.3 kg 70 kg (No Data)     Body mass index is 23.58 kg/m.  General:  Well nourished, frail elderly female, in no acute distress HEENT: normal for age Lymph: no adenopathy Neck: JVD 9-10 cm Endocrine:  No thryomegaly Vascular: No carotid bruits; 4/4 extremity pulses 2+ bilaterally, however distal capillary refill on the left foot is delayed Cardiac:  normal S1, S2; regular rate and rhythm; soft murmur Lungs: Diffuse rales bilateral bases, no wheezing, rhonchi Abd: soft, nontender, no hepatomegaly  Ext: no edema Musculoskeletal:  No deformities, BUE and BLE strength weak but equal Skin: warm and dry  Neuro:  CNs 2-12 intact, no focal abnormalities noted; confused and not able to answer questions well Psych:  Normal affect   EKG:  The EKG was personally reviewed and demonstrates:  07/20 ECG is atrial flutter, RVR, HR 140, PVCs seen as well Telemetry:  Telemetry was personally reviewed and demonstrates:  Atrial flutter  Relevant CV Studies:  ECHO: 01/26/2021  1. Left ventricular ejection fraction, by estimation, is 20 to 25%. The left ventricle has severely decreased function. The left ventricle demonstrates global hypokinesis. There is mild concentric left ventricular hypertrophy. Left ventricular diastolic   parameters are indeterminate.   2. Right ventricular systolic function was not well visualized. The right ventricular size is normal. There is normal pulmonary artery systolic pressure. The estimated right ventricular systolic pressure is 31.5 mmHg.   3. Left atrial size was severely dilated.   4. Right atrial size was mildly dilated.   5. A small pericardial effusion is present.   6. The mitral valve is grossly normal. Trivial mitral valve  regurgitation. No evidence of mitral stenosis.   7. The aortic valve is tricuspid. Aortic valve regurgitation is trivial.  No aortic stenosis is present.   8. The inferior vena cava is normal in  size with <50% respiratory  variability, suggesting right atrial pressure of 8 mmHg.   Comparison(s): A prior study was performed on 05/07/2019. Prior images reviewed side by side. LV function has declined; prior study done in sinus rhythm.    PV CATH: 01/12/2021 1.  Ultrasound-guided access of right common femoral artery 2.  Aortogram including catheter selection of aorta 3.  Left lower extremity arteriogram with selection of third order branches 4.  Primary stent of left distal SFA/above knee popliteal artery high-grade stenosis with post balloon angioplasty (7 mm x 80 mm drug-coated Eluvia, postdilated with a 6 mm balloon)      Laboratory Data:  High Sensitivity Troponin:   Recent Labs  Lab 01/23/2021 1406 01/16/2021 1820  TROPONINIHS 177* 165*     Chemistry Recent Labs  Lab 01/26/21 0100 01/26/21 0855 01/27/21 0302  NA 146* 147* 148*  K 3.3* 3.5 3.8  CL 105 107 109  CO2 29 30 26   GLUCOSE 118* 99 118*  BUN 49* 53* 53*  CREATININE 1.24* 1.35* 1.29*  CALCIUM 9.9 9.6 9.0  GFRNONAA 44* 40* 42*  ANIONGAP 12 10 13     Recent Labs  Lab 01/09/2021 1406 01/26/21 0855 01/27/21 0302  PROT 7.2 5.6* 5.5*  ALBUMIN 3.8 3.0* 2.8*  AST 58* 52* 43*  ALT 66* 48* 44  ALKPHOS 219* 156* 144*  BILITOT 1.5* 0.9 1.0   Hematology Recent Labs  Lab 02/05/2021 1406 01/23/2021 2100 01/26/21 0100 01/27/21 0302  WBC 15.2*  --  17.9* 17.8*  RBC 3.70*  --  3.41* 3.03*  HGB 11.5*  --  10.7* 9.5*  HCT 35.3*  --  32.5* 29.3*  MCV 95.4  --  95.3 96.7  MCH 31.1  --  31.4 31.4  MCHC 32.6  --  32.9 32.4  RDW 16.8*  --  17.1* 17.6*  PLT 73* 76* 72* 66*   BNP Recent Labs  Lab 02/05/2021 1406  BNP 758.5*    DDimer  Recent Labs  Lab 01/28/2021 2100  DDIMER 17.35*     Radiology/Studies:  CT Angio Chest PE W/Cm &/Or Wo Cm  Result Date: 02/03/2021 CLINICAL DATA:  PE suspected, high prob. Pt has a hx of lung cancer. Pt c/o shortness of breath today at 1145. Pt was placed on 6L and was  titrated down to 2L. Recent toe surgery EXAM: CT ANGIOGRAPHY CHEST WITH CONTRAST TECHNIQUE: Multidetector CT imaging of the chest was performed using the standard protocol during bolus administration of intravenous contrast. Multiplanar CT image reconstructions and MIPs were obtained to evaluate the vascular anatomy. CONTRAST:  24mL OMNIPAQUE IOHEXOL 350 MG/ML SOLN COMPARISON:  CT chest 09/30/2020 FINDINGS: Cardiovascular: Satisfactory opacification of the pulmonary arteries to the segmental level. No evidence of pulmonary embolism. Enlarged main pulmonary artery. Cardiomegaly. No pericardial effusion. The thoracic aorta is in caliber. Atherosclerotic plaque is noted. Artery calcifications noted. Mediastinum/Nodes: Limited evaluation for lymphadenopathy due to timing of contrast. No enlarged mediastinal, hilar, or axillary lymph nodes. Thyroid gland, trachea, and esophagus demonstrate no significant findings. Lungs/Pleura: Interval increase in size of a difficult to measure 4.1 x 1.9 x 4.3 Cm (from 1.6 x 1.4 cm) left apical spiculated pulmonary mass. Less conspicuous 1 1 cm nodular like density within lower lobe (9:58). Interval increase in diffuse patchy ground-glass airspace opacities that are more prominent within the lower lung zones. Bilateral trace volume pleural effusions. No pneumothorax. Upper Abdomen: Poor visualization of a suprarenal abdominal aorta aneurysm measuring up to 3.2 cm on axial imaging (7:35). Poor visualization of abdominal aorta aneurysm to 3.6 cm axial imaging (7:50). Partially visualized fluid density lesion kidney. No acute abnormality. No acute abnormality. Musculoskeletal: No chest wall abnormality. No suspicious lytic or blastic osseous lesions. No acute displaced fracture. Multilevel degenerative changes of the spine. Review of the MIP images confirms the above findings. IMPRESSION: 1. No pulmonary embolus. 2. Interval increase in size of a difficult to measure 4.1 x 1.9 cm (from 1.6  x 1.4 cm) left apical spiculated pulmonary mass consistent with lung cancer. 3. Interval increase in diffuse patchy ground-glass airspace opacities. Differential diagnosis includes infection versus inflammation versus adenocarcinoma. Additional imaging evaluation or consultation with Pulmonology or Thoracic Surgery recommended. 4. Bilateral trace effusions. 5. Enlarged main pulmonary artery suggestive of pulmonary hypertension. 6. Cardiomegaly. 7. Poorly visualized/incompletely evaluated abdominal aorta aneurysm measuring up to at least 3.6 cm. Recommend follow-up ultrasound every 2 years. This recommendation follows ACR consensus guidelines: White Paper of the ACR Incidental Findings Committee II on Vascular Findings. J Am Coll Radiol 2013; 10:789-794. 8.  Aortic Atherosclerosis (ICD10-I70.0). Electronically Signed   By: Iven Finn M.D.   On: 01/09/2021 19:00   DG CHEST PORT 1 VIEW  Result Date: 01/26/2021 CLINICAL DATA:  Shortness of breath and history of lung carcinoma EXAM: PORTABLE CHEST 1 VIEW COMPARISON:  02/01/2021 FINDINGS: Cardiac shadow is enlarged but stable. Aortic calcifications are again seen. Patchy airspace opacities are noted bilaterally stable in appearance from the prior study. The patient's known left upper lobe mass lesion is not as well seen as on prior CT examination. No new focal abnormality is noted. IMPRESSION: Stable patchy airspace opacities bilaterally. Electronically Signed  By: Inez Catalina M.D.   On: 01/26/2021 08:50   DG Chest Portable 1 View  Result Date: 01/08/2021 CLINICAL DATA:  Shortness of breath lung cancer EXAM: PORTABLE CHEST 1 VIEW COMPARISON:  01/11/2021, CT chest 09/30/2020, chest x-ray 01/02/2021 FINDINGS: Cardiomegaly with interim development of small bilateral pleural effusions and diffuse bilateral ground-glass opacity, possible pulmonary edema versus diffuse pneumonia. Left upper lobe lung mass is better seen on CT. Aortic atherosclerosis. IMPRESSION:  1. Cardiomegaly with small bilateral pleural effusions and diffuse bilateral ground-glass opacities consistent with pulmonary edema versus diffuse pneumonia 2. Known left upper lobe lung mass is better seen on CT Electronically Signed   By: Donavan Foil M.D.   On: 02/01/2021 15:16   ECHOCARDIOGRAM COMPLETE  Result Date: 01/26/2021    ECHOCARDIOGRAM REPORT   Patient Name:   ARLYN BUMPUS Date of Exam: 01/26/2021 Medical Rec #:  [id number]       Height:       67.0 in Accession #:    [id number]      Weight:       154.3 lb Date of Birth:  September 06, 1940       BSA:          1.811 m Patient Age:    73 years        BP:           150/75 mmHg Patient Gender: F               HR:           103 bpm. Exam Location:  Inpatient Procedure: 2D Echo, Cardiac Doppler and Color Doppler Indications:    CHF-Acute Systolic Q73.41                 Atrial Fibrillation I48.91  History:        Patient has prior history of Echocardiogram examinations, most                 recent 05/07/2019. CHF; Risk Factors:Hypertension and Current                 Smoker.  Sonographer:    Bernadene Person RDCS Referring Phys: [phone] JARED M GARDNER  Sonographer Comments: Image acquisition challenging due to respiratory motion. Pt sitting up during entire echo. IMPRESSIONS  1. Left ventricular ejection fraction, by estimation, is 20 to 25%. The left ventricle has severely decreased function. The left ventricle demonstrates global hypokinesis. There is mild concentric left ventricular hypertrophy. Left ventricular diastolic  parameters are indeterminate.  2. Right ventricular systolic function was not well visualized. The right ventricular size is normal. There is normal pulmonary artery systolic pressure. The estimated right ventricular systolic pressure is 02.4 mmHg.  3. Left atrial size was severely dilated.  4. Right atrial size was mildly dilated.  5. A small pericardial effusion is present.  6. The mitral valve is grossly normal. Trivial mitral valve  regurgitation. No evidence of mitral stenosis.  7. The aortic valve is tricuspid. Aortic valve regurgitation is trivial. No aortic stenosis is present.  8. The inferior vena cava is normal in size with <50% respiratory variability, suggesting right atrial pressure of 8 mmHg. Comparison(s): A prior study was performed on 05/07/2019. Prior images reviewed side by side. LV function has declined; prior study done in sinus rhythm. FINDINGS  Left Ventricle: Left ventricular ejection fraction, by estimation, is 20 to 25%. The left ventricle has severely decreased function. The left ventricle demonstrates global hypokinesis. The left ventricular  internal cavity size was normal in size. There is mild concentric left ventricular hypertrophy. Left ventricular diastolic parameters are indeterminate. Right Ventricle: The right ventricular size is normal. No increase in right ventricular wall thickness. Right ventricular systolic function was not well visualized. There is normal pulmonary artery systolic pressure. The tricuspid regurgitant velocity is  2.01 m/s, and with an assumed right atrial pressure of 8 mmHg, the estimated right ventricular systolic pressure is 69.6 mmHg. Left Atrium: Left atrial size was severely dilated. Right Atrium: Right atrial size was mildly dilated. Pericardium: A small pericardial effusion is present. Mitral Valve: The mitral valve is grossly normal. Trivial mitral valve regurgitation. No evidence of mitral valve stenosis. Tricuspid Valve: The tricuspid valve is normal in structure. Tricuspid valve regurgitation is mild. Aortic Valve: The aortic valve is tricuspid. Aortic valve regurgitation is trivial. No aortic stenosis is present. Pulmonic Valve: The pulmonic valve was normal in structure. Pulmonic valve regurgitation is mild to moderate. No evidence of pulmonic stenosis. Aorta: The aortic root and ascending aorta are structurally normal, with no evidence of dilitation for age and body surface  area. Venous: The inferior vena cava is normal in size with less than 50% respiratory variability, suggesting right atrial pressure of 8 mmHg. IAS/Shunts: The atrial septum is grossly normal.  LEFT VENTRICLE PLAX 2D LVIDd:         5.60 cm LVIDs:         5.00 cm LV PW:         1.10 cm LV IVS:        1.10 cm LVOT diam:     2.10 cm LV SV:         31 LV SV Index:   17 LVOT Area:     3.46 cm  LV Volumes (MOD) LV vol d, MOD A4C: 109.0 ml LV vol s, MOD A4C: 89.7 ml LV SV MOD A4C:     109.0 ml RIGHT VENTRICLE RV S prime:     9.49 cm/s TAPSE (M-mode): 1.7 cm LEFT ATRIUM             Index       RIGHT ATRIUM           Index LA diam:        4.70 cm 2.59 cm/m  RA Area:     20.40 cm LA Vol (A2C):   56.7 ml 31.30 ml/m RA Volume:   47.00 ml  25.95 ml/m LA Vol (A4C):   87.4 ml 48.25 ml/m LA Biplane Vol: 73.1 ml 40.36 ml/m  AORTIC VALVE LVOT Vmax:   64.43 cm/s LVOT Vmean:  43.933 cm/s LVOT VTI:    0.088 m  AORTA Ao Root diam: 3.20 cm Ao Asc diam:  3.80 cm MITRAL VALVE               TRICUSPID VALVE MV Area (PHT): 2.17 cm    TR Peak grad:   16.2 mmHg MV Decel Time: 349 msec    TR Vmax:        201.00 cm/s MV E velocity: 42.10 cm/s MV A velocity: 37.70 cm/s  SHUNTS MV E/A ratio:  1.12        Systemic VTI:  0.09 m                            Systemic Diam: 2.10 cm Rudean Haskell MD Electronically signed by Rudean Haskell MD Signature Date/Time: 01/26/2021/2:35:43 PM    Final  VAS [place] LOWER EXTREMITY VENOUS (DVT) ([facility] 7a-7p)  Result Date: 01/26/2021  Lower Venous DVT Study Patient Name:  JAMAICA INTHAVONG  Date of Exam:   01/10/2021 Medical Rec #: [id number]        Accession #:    [id number] Date of Birth: 12/11/1940        Patient Gender: F Patient Age:   79Y Exam Location:  [facility] Procedure:      VAS [place] LOWER EXTREMITY VENOUS (DVT) Referring Phys: [id number] RACHEL MORGAN LITTLE --------------------------------------------------------------------------------  Indications: Pain.  Risk Factors: None  identified. Comparison Study: No prior studies. Performing Technologist: Oliver Hum RVT  Examination Guidelines: A complete evaluation includes B-mode imaging, spectral Doppler, color Doppler, and power Doppler as needed of all accessible portions of each vessel. Bilateral testing is considered an integral part of a complete examination. Limited examinations for reoccurring indications may be performed as noted. The reflux portion of the exam is performed with the patient in reverse Trendelenburg.  +-----+---------------+---------+-----------+----------+--------------+ RIGHTCompressibilityPhasicitySpontaneityPropertiesThrombus Aging +-----+---------------+---------+-----------+----------+--------------+ CFV  Full           Yes      Yes                                 +-----+---------------+---------+-----------+----------+--------------+   +---------+---------------+---------+-----------+----------+--------------+ LEFT     CompressibilityPhasicitySpontaneityPropertiesThrombus Aging +---------+---------------+---------+-----------+----------+--------------+ CFV      Full           Yes      Yes                                 +---------+---------------+---------+-----------+----------+--------------+ SFJ      Full                                                        +---------+---------------+---------+-----------+----------+--------------+ FV Prox  Full                                                        +---------+---------------+---------+-----------+----------+--------------+ FV Mid   Full                                                        +---------+---------------+---------+-----------+----------+--------------+ FV DistalFull                                                        +---------+---------------+---------+-----------+----------+--------------+ PFV      Full                                                         +---------+---------------+---------+-----------+----------+--------------+  POP      Full           Yes      Yes                                 +---------+---------------+---------+-----------+----------+--------------+ PTV      Full                                                        +---------+---------------+---------+-----------+----------+--------------+ PERO     Partial                                      Acute          +---------+---------------+---------+-----------+----------+--------------+     Summary: RIGHT: - No evidence of common femoral vein obstruction.  LEFT: - Findings consistent with acute deep vein thrombosis involving the left peroneal veins. - No cystic structure found in the popliteal fossa.  *See table(s) above for measurements and observations. Electronically signed by Monica Martinez MD on 01/26/2021 at 2:25:42 PM.    Final      Assessment and Plan:   Acute on chronic hypoxic respiratory failure -She was on oxygen when discharged from the hospital on 7/15. - She seemed to have fairly sudden onset of shortness of breath on 7/20 and was hypoxic despite her O2 being increased to 6 L - She did not tolerate BiPAP with nausea - CCM is seeing and she is on ABX, steroids, IS -Check ABG for increased somnolence  Acute systolic CHF -No significant change in weight, in fact her weight is down 3 pounds from the last weight during her previous admission - However, there were groundglass opacities on chest x-ray and CT concerning for edema versus infection -Her creatinine is above her baseline of approximately 1 -Discuss trying 1 dose of Lasix 40 mg IV with MD -Unclear cause for systolic dysfunction, no wall motion abnormalities on echo and no reports of chest pain -During her previous admission, heart rate was stable and do not think she was in atrial fib so doubt tachycardia mediated - Follow for symptoms, however she is not a candidate for an ischemic  evaluation at this time -At this time, it is not clear if she can swallow pills well.  When she is able to swallow pills, change Cardizem to beta-blocker - Blood pressure is too low to add any other drugs, including ACE/ARB/ARN I, limiting GDMT  Lung CA - L apical Lung mass is now 4.1 x 1.9 x 4.3 Cm (from 1.6 x 1.4 cm by CT 09/30/2020), PET scan showed hypermetabolic activity in March 2022 - mgt per Primary team +/- Oncology -Newman Nip is on hold because of the possibility it can cause cardiomyopathy  4.  Persistent atrial fibrillation/flutter - ECG on admission was atrial flutter, but now she is in atrial fibrillation. -Heart rate is improved but not controlled on Cardizem 2.5 mg/hour -She may be below 100 at rest, but it takes minimal movement to increase her heart rate to over 100 -Continue Cardizem at current rate for now and follow blood pressure, which is sometimes in the 90s -CHA2DS2-VASc is 8, including DVT, so she is already on heparin -  At this time, it is unclear if the patient is swallowing well.  If she is, consider change to treatment dose Eliquis  Otherwise, per IM   Risk Assessment/Risk Scores:        New York Heart Association (NYHA) Functional Class NYHA Class III  CHA2DS2-VASc Score = 8  This indicates a 10.8% annual risk of stroke. The patient's score is based upon: CHF History: Yes HTN History: Yes Diabetes History: No Stroke History: Yes (DVT) Vascular Disease History: Yes Age Score: 2 Gender Score: 1    For questions or updates, please contact [place] Please consult [email or web address] for contact info under    Signed, Rosaria Ferries, PA-C  01/27/2021 10:03 AM

## 2021-01-27 NOTE — Progress Notes (Signed)
PROGRESS NOTE    Heather [name]  ZHG:[id number] DOB: 1940-10-08 DOA: 01/22/2021 PCP: Rosita Fire, MD    Chief Complaint  Patient presents with   Shortness of Breath    Brief Narrative: Patient is a 80 year old female history of non-small cell lung cancer, stage IV, prior transient paroxysmal A. fib not on anticoagulation, hypertension, PAD with recent amputation of 2 toes on the left foot due to necrosis last week, chronic systolic CHF with EF 30 to 35% on prior 2D echo presented to the ED with worsening shortness of breath, increased O2 requirements and requiring at least 6 L of O2 at facility.  Patient had presented with worsening shortness of breath.  Patient noted to have a progressive decline over the past month.  Work-up in the ED including CT angiogram chest concerning for worsening non-small cell lung cancer, lower extremity Dopplers done concerning for left lower extremity peroneal DVT and patient started on heparin, DIC panel obtained concerning for DIC.  Patient on Cardizem drip.  Patient placed on empiric IV antibiotics for possible pneumonia.  Patient with worsening respiratory status, trial of BiPAP done on admission discontinued due to nausea and possible emesis.  Oncology consulted.  We will consult with PCCM due to worsening respiratory status, impending DIC.  Palliative care consultation for goals of care.   Assessment & Plan:   Principal Problem:   Acute on chronic respiratory failure with hypoxia (HCC) Active Problems:   DIC (disseminated intravascular coagulation) ([place])   Adenocarcinoma of left lung, stage 4 (HCC)   Atherosclerosis of native artery of left lower extremity with gangrene (HCC)   Atrial fibrillation with RVR (HCC)   Left leg DVT (HCC)   Decubitus ulcer of sacral region, stage 1   Acute on chronic systolic CHF (congestive heart failure) (HCC)   CAP (community acquired pneumonia)   Acute on chronic congestive heart failure (HCC)   Hypokalemia    Malnutrition of moderate degree   #1 acute on chronic respiratory failure with hypoxia -Likely multifactorial secondary to worsening non-small cell lung cancer stage IV in the setting of possible CAP, component of acute CHF secondary to A. fib with RVR, possible PAH, concern for Tagrisso pulmonary toxicity. -Patient initially placed on the BiPAP which had to be discontinued due to onset of nausea. -Patient with tachypnea, respiratory distress with increased use of accessory muscles of respiration, high risk for intubation.   -Continue empiric IV antibiotics, IV Solu-Medrol 40 mg every 12 hours, IV PPI, IV Rocephin, IV azithromycin. -Place on scheduled Xopenex and Atrovent nebs, Pulmicort nebs, Brovana nebs.  -PCCM consulted and are following.  -Oncology consulted and following and started patient on Solu-Medrol 40 mg IV every 12 hours as patient with difficulty swallowing pills.    2.  Probable DIC -Questionable etiology, likely secondary to worsening malignancy and possible infection etiology -Concern for DIC as patient with elevated PTT > 200, fibrinogen of < 60, D-dimer of 17.35, smear with schistocytes noted.  Patient on heparin drip secondary to left lower extremity DVT. -Hematology/oncology consulted and following. -PCCM following and appreciate input and recommendations. -Continue IV antibiotics. -Follow.  3.  Suspected CAP -MRSA PCR negative. -COVID-19 PCR negative. -Continue empiric IV Rocephin and IV azithromycin.  4.  A. fib with RVR ( CHA2DS2VASC score 8) -Currently on a Cardizem drip. -Per RN patient unable to tolerate oral pills and as such Toprol-XL changed to oral Lopressor twice daily.  -2D echo with EF of 20 to 25%, left ventricular global hypokinesis, severely  dilated left atrial size, mildly dilated right atrial size, small pericardial effusion present.   -Continue heparin. -Consult cardiology.  5.  Acute on chronic systolic CHF -Likely transient secondary to A.  fib with RVR. -Patient currently does not look volume overloaded on examination and looks more on the dry side.   -Patient with poor oral intake.   -2D echo with EF of 20 to 25%, left ventricular global hypokinesis, severely dilated left atrial size, mildly dilated right atrial size, small pericardial effusion present.   -Strict I's and O's  -Daily weights.   -Due to worsening EF and patient presentation with acute respiratory distress we will consult with cardiology for further evaluation and management. -??  Continuation of Cardizem drip however will defer to cardiology as patient with significant aspiration risk and currently NPO.  6.  Non-small cell lung cancer -Worsening non-small cell lung cancer per CT scan. -Tagrisso on hold secondary to problem #1.   -Oncology consulted and following and are in agreement with holding Tagrisso at this time.   -Per oncology.  7.  Left lower extremity DVT -CT angiogram chest negative for PE. -Continue heparin drip. -Hematology/oncology consulted.  8.  Thrombocytopenia -Patient with a DVT left lower extremity. -Thrombocytopenia likely secondary to DIC versus consumption of platelets by DVT.   -Currently on heparin drip.   -Hematology/oncology following.   9.  Left lower extremity gangrene of the toes -Status post amputation of fourth and fifth toes last week. -Second and third toes with bluish discoloration. -Monitor closely. -Continue wound dressing. -Monitor over the next 24 hours and depending on clinical trajectory and palliative care consultation may consider involvement with vascular surgery. -Follow.  10.  Hypertension -Blood pressure soft this morning.  Currently on oral Lopressor, Cardizem drip.    11.  Hypokalemia -Repleted.  Potassium at 3.8.  12.  Dysphagia -Patient assessed by speech therapy and patient noted to be at severe aspiration risk at this time and recommending n.p.o. with ice chips, teaspoons of thin water.   Cortrack and tube feeds been ordered per PCCM.    DVT prophylaxis: Heparin drip Code Status: Full Family Communication: Updated patient, no family at bedside.. Disposition:   Status is: Inpatient  Remains inpatient appropriate because:IV treatments appropriate due to intensity of illness or inability to take PO  Dispo: The patient is from: SNF              Anticipated d/c is to:  TBD              Patient currently is not medically stable to d/c.   Difficult to place patient No       Consultants:  Oncology PCCM: Dr.Olalere 01/26/2021 Cardiology  Procedures:  CT angiogram chest 02/02/2021 Chest x-ray 01/20/2021, 01/26/2021 Lower extremity Doppler 01/16/2021    Antimicrobials:  IV azithromycin 01/10/2021>>>> IV Rocephin 01/27/2021>>>>   Subjective: Sitting up in bed looking at cell phone.  In some respiratory distress on 8 L high flow nasal cannula some use of accessory muscles of respiration.  Patient denies any chest pain.  No abdominal pain.  On Cardizem drip.   Objective: Vitals:   01/27/21 0600 01/27/21 0700 01/27/21 0746 01/27/21 0814  BP: 104/81 95/68    Pulse: 84 81    Resp: (!) 34 (!) 29    Temp:   (!) 97.3 F (36.3 C)   TempSrc:   Axillary   SpO2: 97% 100%    Weight:    68.3 kg  Height:  Intake/Output Summary (Last 24 hours) at 01/27/2021 1047 Last data filed at 01/27/2021 0600 Gross per 24 hour  Intake 871.02 ml  Output 650 ml  Net 221.02 ml    Filed Weights   01/30/2021 1604 01/27/21 0814  Weight: 70 kg 68.3 kg    Examination:  General exam: On 8 L high flow nasal cannula.  Some use of accessory muscles of respiration.  Some moderate respiratory distress.   Respiratory system: Tachypnea.  Some use of accessory muscles of respiration.  Some coarse diffuse rhonchorous breath sounds left > right.  No wheezing.  No crackles noted.  Cardiovascular system: Irregularly irregular.  No JVD, no murmurs rubs or gallops.  No lower extremity  edema. Gastrointestinal system: Abdomen is soft, nontender, nondistended, positive bowel sounds.  No rebound.  No guarding.  Central nervous system: Alert.  Moving extremities spontaneously.  No focal neurological deficits.  Extremities: Left foot wrapped in bandage with bluish discoloration of second and third toes.  Skin: No rashes, lesions or ulcers Psychiatry: Judgement and insight appear poor to fair. Mood & affect appropriate.     Data Reviewed: I have personally reviewed following labs and imaging studies  CBC: Recent Labs  Lab 01/24/21 0958 01/23/2021 1406 01/26/2021 2100 01/26/21 0100 01/27/21 0302  WBC 15.8* 15.2*  --  17.9* 17.8*  NEUTROABS 12.1* 10.7*  --   --  13.6*  HGB 10.5* 11.5*  --  10.7* 9.5*  HCT 30.5* 35.3*  --  32.5* 29.3*  MCV 91.3 95.4  --  95.3 96.7  PLT 98* 73* 76* 72* 66*     Basic Metabolic Panel: Recent Labs  Lab 01/24/21 0958 01/30/2021 1406 01/26/21 0100 01/26/21 0855 01/27/21 0302  NA 146* 146* 146* 147* 148*  K 3.1* 3.5 3.3* 3.5 3.8  CL 103 103 105 107 109  CO2 28 29 29 30 26   GLUCOSE 147* 141* 118* 99 118*  BUN 34* 48* 49* 53* 53*  CREATININE 0.95 1.11* 1.24* 1.35* 1.29*  CALCIUM 10.1 10.8* 9.9 9.6 9.0  MG  --   --   --   --  2.3     GFR: Estimated Creatinine Clearance: 33.8 mL/min (A) (by C-G formula based on SCr of 1.29 mg/dL (H)).  Liver Function Tests: Recent Labs  Lab 01/24/21 0958 01/11/2021 1406 01/26/21 0855 01/27/21 0302  AST 55* 58* 52* 43*  ALT 58* 66* 48* 44  ALKPHOS 202* 219* 156* 144*  BILITOT 1.3* 1.5* 0.9 1.0  PROT 6.5 7.2 5.6* 5.5*  ALBUMIN 3.2* 3.8 3.0* 2.8*     CBG: No results for input(s): GLUCAP in the last 168 hours.   Recent Results (from the past 240 hour(s))  Surgical PCR screen     Status: None   Collection Time: 01/17/21  8:28 PM   Specimen: Nasal Mucosa; Nasal Swab  Result Value [place] Status   MRSA, PCR NEGATIVE NEGATIVE Final   Staphylococcus aureus NEGATIVE NEGATIVE Final     Comment: (NOTE) The Xpert SA Assay (FDA approved for NASAL specimens in patients 56 years of age and older), is one component of a comprehensive surveillance program. It is not intended to diagnose infection nor to guide or monitor treatment. Performed at [facility], [place] [address], [place], [place] [address]   SARS CORONAVIRUS 2 (TAT 6-24 HRS) Nasopharyngeal Nasopharyngeal Swab     Status: None   Collection Time: 01/19/21  8:44 PM   Specimen: Nasopharyngeal Swab  Result Value [place] Status  SARS Coronavirus 2 NEGATIVE NEGATIVE Final    Comment: (NOTE) SARS-CoV-2 target nucleic acids are NOT DETECTED.  The SARS-CoV-2 RNA is generally detectable in upper and lower respiratory specimens during the acute phase of infection. Negative results do not preclude SARS-CoV-2 infection, do not rule out co-infections with other pathogens, and should not be used as the sole basis for treatment or other patient management decisions. Negative results must be combined with clinical observations, patient history, and epidemiological information. The expected result is Negative.  Fact Sheet for Patients: SugarRoll.be  Fact Sheet for Healthcare Providers: https://www.woods-mathews.com/  This test is not yet approved or cleared by the [place] FDA and  has been authorized for detection and/or diagnosis of SARS-CoV-2 by FDA under an Emergency Use Authorization (EUA). This EUA will remain  in effect (meaning this test can be used) for the duration of the COVID-19 declaration under Se ction 564(b)(1) of the Act, 21 U.S.C. section 360bbb-3(b)(1), unless the authorization is terminated or revoked sooner.  Performed at [facility], [place] [address], [place], [place] [address]   Resp Panel by RT-PCR (Flu A&B, Covid) Nasopharyngeal Swab     Status: None   Collection Time: 01/22/2021  2:06 PM   Specimen: Nasopharyngeal Swab; Nasopharyngeal(NP)  swabs in vial transport medium  Result Value [place] Status   SARS Coronavirus 2 by RT PCR NEGATIVE NEGATIVE Final    Comment: (NOTE) SARS-CoV-2 target nucleic acids are NOT DETECTED.  The SARS-CoV-2 RNA is generally detectable in upper respiratory specimens during the acute phase of infection. The lowest concentration of SARS-CoV-2 viral copies this assay can detect is 138 copies/mL. A negative result does not preclude SARS-Cov-2 infection and should not be used as the sole basis for treatment or other patient management decisions. A negative result may occur with  improper specimen collection/handling, submission of specimen other than nasopharyngeal swab, presence of viral mutation(s) within the areas targeted by this assay, and inadequate number of viral copies(<138 copies/mL). A negative result must be combined with clinical observations, patient history, and epidemiological information. The expected result is Negative.  Fact Sheet for Patients:  EntrepreneurPulse.com.au  Fact Sheet for Healthcare Providers:  IncredibleEmployment.be  This test is no t yet approved or cleared by the [place] FDA and  has been authorized for detection and/or diagnosis of SARS-CoV-2 by FDA under an Emergency Use Authorization (EUA). This EUA will remain  in effect (meaning this test can be used) for the duration of the COVID-19 declaration under Section 564(b)(1) of the Act, 21 U.S.C.section 360bbb-3(b)(1), unless the authorization is terminated  or revoked sooner.       Influenza A by PCR NEGATIVE NEGATIVE Final   Influenza B by PCR NEGATIVE NEGATIVE Final    Comment: (NOTE) The Xpert Xpress SARS-CoV-2/FLU/RSV plus assay is intended as an aid in the diagnosis of influenza from Nasopharyngeal swab specimens and should not be used as a sole basis for treatment. Nasal washings and aspirates are unacceptable for Xpert Xpress  SARS-CoV-2/FLU/RSV testing.  Fact Sheet for Patients: EntrepreneurPulse.com.au  Fact Sheet for Healthcare Providers: IncredibleEmployment.be  This test is not yet approved or cleared by the [place] FDA and has been authorized for detection and/or diagnosis of SARS-CoV-2 by FDA under an Emergency Use Authorization (EUA). This EUA will remain in effect (meaning this test can be used) for the duration of the COVID-19 declaration under Section 564(b)(1) of the Act, 21 U.S.C. section 360bbb-3(b)(1), unless the authorization is terminated or revoked.  Performed  at [facility], [place] [address] [place], [place] [address]   MRSA Next Gen by PCR, Nasal     Status: None   Collection Time: 01/31/2021  9:07 PM   Specimen: Nasal Mucosa; Nasal Swab  Result Value [place] Status   MRSA by PCR Next Gen NOT DETECTED NOT DETECTED Final    Comment: (NOTE) The GeneXpert MRSA Assay (FDA approved for NASAL specimens only), is one component of a comprehensive MRSA colonization surveillance program. It is not intended to diagnose MRSA infection nor to guide or monitor treatment for MRSA infections. Test performance is not FDA approved in patients less than 71 years old. Performed at [facility], [place] [address] [place], [place] [address]   Culture, blood (Routine X 2) w Reflex to ID Panel     Status: None (Preliminary result)   Collection Time: 01/26/21  8:55 AM   Specimen: BLOOD RIGHT HAND  Result Value [place] Status   Specimen Description   Final    BLOOD RIGHT HAND Performed at [place] [address], [place], [place] [address]    Special Requests   Final    BOTTLES DRAWN AEROBIC ONLY Blood Culture results may not be optimal due to an inadequate volume of blood received in culture bottles Performed at [place] [address] [place], [place] [address]    Culture    Final    NO GROWTH < 24 HOURS Performed at [facility] [address] [place], [place] [address]    Report Status PENDING  Incomplete  Culture, blood (Routine X 2) w Reflex to ID Panel     Status: None (Preliminary result)   Collection Time: 01/26/21  8:55 AM   Specimen: BLOOD LEFT HAND  Result Value [place] Status   Specimen Description   Final    BLOOD LEFT HAND Performed at [place] [address], [place], [place] [address]    Special Requests   Final    BOTTLES DRAWN AEROBIC ONLY Blood Culture results may not be optimal due to an inadequate volume of blood received in culture bottles Performed at [place] [address], [place], [place] [address]    Culture   Final    NO GROWTH < 24 HOURS Performed at [facility] [address], [place],  [address]    Report Status PENDING  Incomplete          Radiology Studies: CT Angio Chest PE W/Cm &/Or Wo Cm  Result Date: 01/06/2021 CLINICAL DATA:  PE suspected, high prob. Pt has a hx of lung cancer. Pt c/o shortness of breath today at 1145. Pt was placed on 6L and was titrated down to 2L. Recent toe surgery EXAM: CT ANGIOGRAPHY CHEST WITH CONTRAST TECHNIQUE: Multidetector CT imaging of the chest was performed using the standard protocol during bolus administration of intravenous contrast. Multiplanar CT image reconstructions and MIPs were obtained to evaluate the vascular anatomy. CONTRAST:  23mL OMNIPAQUE IOHEXOL 350 MG/ML SOLN COMPARISON:  CT chest 09/30/2020 FINDINGS: Cardiovascular: Satisfactory opacification of the pulmonary arteries to the segmental level. No evidence of pulmonary embolism. Enlarged main pulmonary artery. Cardiomegaly. No pericardial effusion. The thoracic aorta is in caliber. Atherosclerotic plaque is noted. Artery calcifications noted. Mediastinum/Nodes: Limited evaluation for lymphadenopathy due to timing of contrast. No enlarged  mediastinal, hilar, or axillary lymph nodes. Thyroid gland, trachea, and esophagus demonstrate no significant findings. Lungs/Pleura:  Interval increase in size of a difficult to measure 4.1 x 1.9 x 4.3 Cm (from 1.6 x 1.4 cm) left apical spiculated pulmonary mass. Less conspicuous 1 1 cm nodular like density within lower lobe (9:58). Interval increase in diffuse patchy ground-glass airspace opacities that are more prominent within the lower lung zones. Bilateral trace volume pleural effusions. No pneumothorax. Upper Abdomen: Poor visualization of a suprarenal abdominal aorta aneurysm measuring up to 3.2 cm on axial imaging (7:35). Poor visualization of abdominal aorta aneurysm to 3.6 cm axial imaging (7:50). Partially visualized fluid density lesion kidney. No acute abnormality. No acute abnormality. Musculoskeletal: No chest wall abnormality. No suspicious lytic or blastic osseous lesions. No acute displaced fracture. Multilevel degenerative changes of the spine. Review of the MIP images confirms the above findings. IMPRESSION: 1. No pulmonary embolus. 2. Interval increase in size of a difficult to measure 4.1 x 1.9 cm (from 1.6 x 1.4 cm) left apical spiculated pulmonary mass consistent with lung cancer. 3. Interval increase in diffuse patchy ground-glass airspace opacities. Differential diagnosis includes infection versus inflammation versus adenocarcinoma. Additional imaging evaluation or consultation with Pulmonology or Thoracic Surgery recommended. 4. Bilateral trace effusions. 5. Enlarged main pulmonary artery suggestive of pulmonary hypertension. 6. Cardiomegaly. 7. Poorly visualized/incompletely evaluated abdominal aorta aneurysm measuring up to at least 3.6 cm. Recommend follow-up ultrasound every 2 years. This recommendation follows ACR consensus guidelines: White Paper of the ACR Incidental Findings Committee II on Vascular Findings. J Am Coll Radiol 2013; 10:789-794. 8.  Aortic Atherosclerosis  (ICD10-I70.0). Electronically Signed   By: Iven Finn M.D.   On: 01/31/2021 19:00   DG CHEST PORT 1 VIEW  Result Date: 01/26/2021 CLINICAL DATA:  Shortness of breath and history of lung carcinoma EXAM: PORTABLE CHEST 1 VIEW COMPARISON:  02/01/2021 FINDINGS: Cardiac shadow is enlarged but stable. Aortic calcifications are again seen. Patchy airspace opacities are noted bilaterally stable in appearance from the prior study. The patient's known left upper lobe mass lesion is not as well seen as on prior CT examination. No new focal abnormality is noted. IMPRESSION: Stable patchy airspace opacities bilaterally. Electronically Signed   By: Inez Catalina M.D.   On: 01/26/2021 08:50   DG Chest Portable 1 View  Result Date: 01/26/2021 CLINICAL DATA:  Shortness of breath lung cancer EXAM: PORTABLE CHEST 1 VIEW COMPARISON:  01/11/2021, CT chest 09/30/2020, chest x-ray 01/02/2021 FINDINGS: Cardiomegaly with interim development of small bilateral pleural effusions and diffuse bilateral ground-glass opacity, possible pulmonary edema versus diffuse pneumonia. Left upper lobe lung mass is better seen on CT. Aortic atherosclerosis. IMPRESSION: 1. Cardiomegaly with small bilateral pleural effusions and diffuse bilateral ground-glass opacities consistent with pulmonary edema versus diffuse pneumonia 2. Known left upper lobe lung mass is better seen on CT Electronically Signed   By: Donavan Foil M.D.   On: 01/12/2021 15:16   ECHOCARDIOGRAM COMPLETE  Result Date: 01/26/2021    ECHOCARDIOGRAM REPORT   Patient Name:   Heather [name] Date of Exam: 01/26/2021 Medical Rec #:  [id number]       Height:       67.0 in Accession #:    [id number]      Weight:       154.3 lb Date of Birth:  February 07, 1941       BSA:          1.811 m Patient Age:    48 years        BP:  150/75 mmHg Patient Gender: F               HR:           103 bpm. Exam Location:  Inpatient Procedure: 2D Echo, Cardiac Doppler and Color Doppler Indications:     CHF-Acute Systolic W62.03                 Atrial Fibrillation I48.91  History:        Patient has prior history of Echocardiogram examinations, most                 recent 05/07/2019. CHF; Risk Factors:Hypertension and Current                 Smoker.  Sonographer:    Bernadene Person RDCS Referring Phys: [phone] JARED M GARDNER  Sonographer Comments: Image acquisition challenging due to respiratory motion. Pt sitting up during entire echo. IMPRESSIONS  1. Left ventricular ejection fraction, by estimation, is 20 to 25%. The left ventricle has severely decreased function. The left ventricle demonstrates global hypokinesis. There is mild concentric left ventricular hypertrophy. Left ventricular diastolic  parameters are indeterminate.  2. Right ventricular systolic function was not well visualized. The right ventricular size is normal. There is normal pulmonary artery systolic pressure. The estimated right ventricular systolic pressure is 41.6 mmHg.  3. Left atrial size was severely dilated.  4. Right atrial size was mildly dilated.  5. A small pericardial effusion is present.  6. The mitral valve is grossly normal. Trivial mitral valve regurgitation. No evidence of mitral stenosis.  7. The aortic valve is tricuspid. Aortic valve regurgitation is trivial. No aortic stenosis is present.  8. The inferior vena cava is normal in size with <50% respiratory variability, suggesting right atrial pressure of 8 mmHg. Comparison(s): A prior study was performed on 05/07/2019. Prior images reviewed side by side. LV function has declined; prior study done in sinus rhythm. FINDINGS  Left Ventricle: Left ventricular ejection fraction, by estimation, is 20 to 25%. The left ventricle has severely decreased function. The left ventricle demonstrates global hypokinesis. The left ventricular internal cavity size was normal in size. There is mild concentric left ventricular hypertrophy. Left ventricular diastolic parameters are indeterminate.  Right Ventricle: The right ventricular size is normal. No increase in right ventricular wall thickness. Right ventricular systolic function was not well visualized. There is normal pulmonary artery systolic pressure. The tricuspid regurgitant velocity is  2.01 m/s, and with an assumed right atrial pressure of 8 mmHg, the estimated right ventricular systolic pressure is 38.4 mmHg. Left Atrium: Left atrial size was severely dilated. Right Atrium: Right atrial size was mildly dilated. Pericardium: A small pericardial effusion is present. Mitral Valve: The mitral valve is grossly normal. Trivial mitral valve regurgitation. No evidence of mitral valve stenosis. Tricuspid Valve: The tricuspid valve is normal in structure. Tricuspid valve regurgitation is mild. Aortic Valve: The aortic valve is tricuspid. Aortic valve regurgitation is trivial. No aortic stenosis is present. Pulmonic Valve: The pulmonic valve was normal in structure. Pulmonic valve regurgitation is mild to moderate. No evidence of pulmonic stenosis. Aorta: The aortic root and ascending aorta are structurally normal, with no evidence of dilitation for age and body surface area. Venous: The inferior vena cava is normal in size with less than 50% respiratory variability, suggesting right atrial pressure of 8 mmHg. IAS/Shunts: The atrial septum is grossly normal.  LEFT VENTRICLE PLAX 2D LVIDd:         5.60 cm  LVIDs:         5.00 cm LV PW:         1.10 cm LV IVS:        1.10 cm LVOT diam:     2.10 cm LV SV:         31 LV SV Index:   17 LVOT Area:     3.46 cm  LV Volumes (MOD) LV vol d, MOD A4C: 109.0 ml LV vol s, MOD A4C: 89.7 ml LV SV MOD A4C:     109.0 ml RIGHT VENTRICLE RV S prime:     9.49 cm/s TAPSE (M-mode): 1.7 cm LEFT ATRIUM             Index       RIGHT ATRIUM           Index LA diam:        4.70 cm 2.59 cm/m  RA Area:     20.40 cm LA Vol (A2C):   56.7 ml 31.30 ml/m RA Volume:   47.00 ml  25.95 ml/m LA Vol (A4C):   87.4 ml 48.25 ml/m LA Biplane  Vol: 73.1 ml 40.36 ml/m  AORTIC VALVE LVOT Vmax:   64.43 cm/s LVOT Vmean:  43.933 cm/s LVOT VTI:    0.088 m  AORTA Ao Root diam: 3.20 cm Ao Asc diam:  3.80 cm MITRAL VALVE               TRICUSPID VALVE MV Area (PHT): 2.17 cm    TR Peak grad:   16.2 mmHg MV Decel Time: 349 msec    TR Vmax:        201.00 cm/s MV E velocity: 42.10 cm/s MV A velocity: 37.70 cm/s  SHUNTS MV E/A ratio:  1.12        Systemic VTI:  0.09 m                            Systemic Diam: 2.10 cm Rudean Haskell MD Electronically signed by Rudean Haskell MD Signature Date/Time: 01/26/2021/2:35:43 PM    Final    VAS [place] LOWER EXTREMITY VENOUS (DVT) ([facility] and [facility] 7a-7p)  Result Date: 01/26/2021  Lower Venous DVT Study Patient Name:  AILINE HEFFERAN  Date of Exam:   01/24/2021 Medical Rec #: [id number]        Accession #:    [id number] Date of Birth: 07-09-1941        Patient Gender: F Patient Age:   79Y Exam Location:  [facility] Procedure:      VAS [place] LOWER EXTREMITY VENOUS (DVT) Referring Phys: [id number] RACHEL MORGAN LITTLE --------------------------------------------------------------------------------  Indications: Pain.  Risk Factors: None identified. Comparison Study: No prior studies. Performing Technologist: Oliver Hum RVT  Examination Guidelines: A complete evaluation includes B-mode imaging, spectral Doppler, color Doppler, and power Doppler as needed of all accessible portions of each vessel. Bilateral testing is considered an integral part of a complete examination. Limited examinations for reoccurring indications may be performed as noted. The reflux portion of the exam is performed with the patient in reverse Trendelenburg.  +-----+---------------+---------+-----------+----------+--------------+ RIGHTCompressibilityPhasicitySpontaneityPropertiesThrombus Aging +-----+---------------+---------+-----------+----------+--------------+ CFV  Full           Yes      Yes                                  +-----+---------------+---------+-----------+----------+--------------+   +---------+---------------+---------+-----------+----------+--------------+  LEFT     CompressibilityPhasicitySpontaneityPropertiesThrombus Aging +---------+---------------+---------+-----------+----------+--------------+ CFV      Full           Yes      Yes                                 +---------+---------------+---------+-----------+----------+--------------+ SFJ      Full                                                        +---------+---------------+---------+-----------+----------+--------------+ FV Prox  Full                                                        +---------+---------------+---------+-----------+----------+--------------+ FV Mid   Full                                                        +---------+---------------+---------+-----------+----------+--------------+ FV DistalFull                                                        +---------+---------------+---------+-----------+----------+--------------+ PFV      Full                                                        +---------+---------------+---------+-----------+----------+--------------+ POP      Full           Yes      Yes                                 +---------+---------------+---------+-----------+----------+--------------+ PTV      Full                                                        +---------+---------------+---------+-----------+----------+--------------+ PERO     Partial                                      Acute          +---------+---------------+---------+-----------+----------+--------------+     Summary: RIGHT: - No evidence of common femoral vein obstruction.  LEFT: - Findings consistent with acute deep vein thrombosis involving the left peroneal veins. - No cystic structure found in the popliteal fossa.  *See table(s) above for measurements and observations.  Electronically signed by Monica Martinez MD on 01/26/2021 at  2:25:42 PM.    Final         Scheduled Meds:  atorvastatin  40 mg Oral QHS   Chlorhexidine Gluconate Cloth  6 each Topical Daily   cyanocobalamin  1,000 mcg Subcutaneous Daily   mouth rinse  15 mL Mouth Rinse BID   methylPREDNISolone (SOLU-MEDROL) injection  40 mg Intravenous Q12H   metoprolol tartrate  12.5 mg Oral BID   pantoprazole (PROTONIX) IV  40 mg Intravenous Q24H   Continuous Infusions:  azithromycin Stopped (01/27/21 0033)   cefTRIAXone (ROCEPHIN)  IV Stopped (01/26/21 2245)   dextrose 50 mL/hr at 01/27/21 0910   diltiazem (CARDIZEM) infusion 2.5 mg/hr (01/27/21 0600)   heparin 1,000 Units/hr (01/27/21 0600)     LOS: 2 days    Time spent: 40 mins    Irine Seal, MD [facility]   To contact the attending provider between 7A-7P or the covering provider during after hours 7P-7A, please log into the web site [email or web address] and access using universal  password for that web site. If you do not have the password, please call the hospital operator.  01/27/2021, 10:47 AM

## 2021-01-27 NOTE — Consult Note (Signed)
Consultation Note Date: 01/27/2021   Patient Name: Heather [name]  DOB: 1941-02-03  MRN: [id number]  Age / Sex: 80 y.o., female  PCP: Heather Fire, MD Referring Physician: Eugenie Filler, MD  Reason for Consultation: Establishing goals of care  HPI/Patient Profile: 80 y.o. female who was living independently at home alone.  She has a past medical history of non small cell lung cancer s/p RU lobectomy in 2013 with recurrence in 2019, CHF, afib/flutter, constipation, tobacco use, PVD s/p stenting, she underwent amputation of left 4th and 5th toe last week.  She was admitted on 01/10/2021 with shortness of breath.  She was placed at [facility] 3 days.  The patient was placed on BiPAP but did not tolerate it.  DDX for SOB is likely multifactorial.  She was found to have a DVT but no PE.  She is being treated for community acquired pneumonia.  Imaging shows a significant increase in [facility] tumor in her LUL from 1.6 x 1.4 to 4.1 x 4.3, BNP was slightly elevated. Echocardiogram shows a decrease in her LVEF from 30-35% to 20-25%. Oncology started immunotherapy with Tagrisso on 6/22 and there is concern for possible pulmonary toxicity.  In addition the patient has probable DIC thought to be due to [facility].   She is quite encephalopathic, barely taking POs and refused placement of a cortrak tube.  Clinical Assessment and Goals of Care: I have reviewed medical records including EPIC notes, labs and imaging, received report from RN, assessed the patient and then met at the bedside along with Heather [name] to discuss diagnosis prognosis, GOC, EOL wishes, disposition and options.  I introduced Palliative Medicine as specialized medical care for people living with serious illness. It focuses on providing relief from the symptoms and stress of a serious illness. The goal is to improve quality of life for both the patient and the  family.  We discussed a brief life review of the patient and then focused on their current illness. The natural disease trajectory and expectations at EOL were discussed.  Heather [name] shares that her mother is a strong determined woman who would want Heather [name] to fight for her.  Heather [name] shares a story about an Uncle who was recently suffering with what appeared to be end of life symptoms from [place].  After doing some research Heather [name] suggested the use of steroids to the physicians and her Heather [name] turned around and is now functioning normally again.  Heather [name] explains that her mother expects her to find a way thru for her as well.    Heather [name] has 3 children - Heather [name] and two sons (Heather [name]'s half brothers).   Heather [name] explains that her mother and her brothers would want her to make health care decisions for the patient.   We discussed that in [place] the majority of the patient's adult children would be her decision maker.  Heather [name] explains that her brothers will arrive her tomorrow from out of state.  While she has been keeping them updated she would appreciate a Palliative  family meeting to share information with her brothers.    We talked about tough decisions such as code status and escalation of care.  Heather [name] decided that DNR was most appropriate for her mother at this point.  I supported this decision as Heather [name] would be unlikely to recover after a cardiac arrest and code blue.  With regard to other questions - escalation of care - we discussed the possibility of pressors.  We discussed the discoloration of her 2nd and 3rd toe indicating poor circulation still present in the foot - would there be further surgery?   Heather [name] did not see how her mother could tolerate more surgery and she was inclined to be against it.  After much discussion - we both agreed that Heather [name] needs a few days to declare herself.   Heather [name] understands her mother is nearing EOL, she does not want her mother to suffer - but  would like to fight to preserve time and quality of life if possible.  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  She does not want to go into one.  Fortunately if the logistics of care can be arranged - Heather [name] has the resources to provide private care for her mother.  Questions and concerns were addressed.  Hard Choices booklet left for review. The family was encouraged to call with questions or concerns.  PMT will continue to support holistically.    Primary Decision Maker:  NEXT OF KIN  Heather [name] and her two brothers.    SUMMARY OF RECOMMENDATIONS    Continue current care.   Patient is moaning when she breathes, appears anxious and frightened - initiated very low dose oral ativan PRN in the hope that this will help reduce anxiety and improve her breathing. PMT will follow up with Heather [name] and her brothers tomorrow.  Code Status/Advance Care Planning: Changed to DNR/DNI   Symptom Management:  As above.  Additional Recommendations (Limitations, Scope, Preferences): Full Scope Treatment  Palliative Prophylaxis:  Delirium Protocol  Psycho-social/Spiritual:  Desire for further Chaplaincy support: Patient is [place].  Supportive Chaplain visit would likely be welcomed.  Prognosis:  Patient is very fragile - encephalopathic with acute respiratory failure, Afib RVR, acute DVT, worsening NSC lung CA despite immunotherapy, worsening LVEF, very poor PO intake, and DIC.   If she survives this hospitalization she likely has weeks to months.     Discharge Planning: To Be Determined      Primary Diagnoses: Present on Admission:  Acute on chronic respiratory failure with hypoxia ([place])  Adenocarcinoma of left lung, stage 4 (HCC)  Atrial fibrillation with RVR (HCC)  Left leg DVT (HCC)  Decubitus ulcer of sacral region, stage 1  Atherosclerosis of native artery  of left lower extremity with gangrene (HCC)  Acute on chronic systolic CHF (congestive heart failure) (HCC)  CAP (community acquired pneumonia)  DIC (disseminated intravascular coagulation) ([place])   I have reviewed the medical record, interviewed the patient and family, and examined the patient. The following aspects are pertinent.  Past Medical History:  Diagnosis Date   Ascending aortic aneurysm ([place]) 07/20/2014   4.1 cm by CT   Atrial fib/flutter, transient    post-op   CHF (congestive heart failure) ([place])    a. EF 30-35% by echo in 2013 --> similar results by repeat imaging in 2016 and 11/2017.   Constipation  corrects with diet   Floater, vitreous    Right   Hypertension    Nonsquamous nonsmall cell neoplasm of lung ([place]) 09/2011   Stage IB- s/p right upper lobectomy   Pneumonia    Psoriasis    Ankles   Tobacco abuse    Social History   Socioeconomic History   Marital status: Widowed    Spouse name: Not on file   Number of children: 3   Years of education: Not on file   Highest education level: Not on file  Occupational History   Occupation: retired  Tobacco Use   Smoking status: Some Days    Packs/day: 0.30    Years: 20.00    Pack years: 6.00    Types: Cigarettes    Last attempt to quit: 01/12/2014    Years since quitting: 7.0   Smokeless tobacco: Never   Tobacco comments:    11/16/20: Per pt, she smokes 2pk per week.  Vaping Use   Vaping Use: Never used  Substance and Sexual Activity   Alcohol use: Yes    Comment: occasional    Drug use: No   Sexual activity: Not on file  Other Topics Concern   Not on file  Social History Narrative   Lives w/ cousin   Social Determinants of Health   Financial Resource Strain: Not on file  Food Insecurity: Not on file  Transportation Needs: Not on file  Physical Activity: Not on file  Stress: Not on file  Social Connections: Not on file   Family History  Problem Relation Age of Onset   Hypertension Mother     Heart disease Mother    Heart disease Sister    Heart disease Brother    Colon cancer Neg Hx     Allergies  Allergen Reactions   Ibuprofen Nausea And Vomiting and Other (See Comments)    "Allergic," per MAR   Powder Rash and Other (See Comments)    NO gloves with powder- "Allergic," per MAR     Vital Signs: BP 128/90   Pulse 93   Temp 97.8 F (36.6 C) (Axillary)   Resp (!) 40   Ht 5' 7"  (1.702 m)   Wt 68.3 kg   SpO2 92%   BMI 23.58 kg/m  Pain Scale: PAINAD   Pain Score: 0-No pain   SpO2: SpO2: 92 % O2 Device:SpO2: 92 % O2 Flow Rate: .O2 Flow Rate (L/min): 10 L/min    Palliative Assessment/Data: 20%     Time In: 4:20 Time Out: 5:30 Time Total: 70 min. Visit consisted of counseling and education dealing with the complex and emotionally intense issues surrounding the need for palliative care and symptom management in the setting of serious and potentially life-threatening illness. Greater than 50%  of this time was spent counseling and coordinating care related to the above assessment and plan.  Signed by: Florentina Jenny, PA-C Palliative Medicine  Please contact Palliative Medicine Team phone at [phone] for questions and concerns.  For individual provider: See Shea Evans

## 2021-01-27 NOTE — Progress Notes (Addendum)
NAME:  Heather [name], MRN:  [id number], DOB:  08-26-1940, LOS: 2 ADMISSION DATE:  01/14/2021, CONSULTATION DATE:  7/21 REFERRING MD:  Dr. Grandville Silos, CHIEF COMPLAINT:  Shortness of Breath  History of Present Illness:  80 y/o F who presented to [facility] on 7/20 with reports of shortness of breath.   The patient has been most recently living at [facility] after an admission to [facility] from 7/5-7/15 for PAD with LLE ischemic necrosis of the her last two toes.  The patient underwent angiogram for critical limb ischemia on 7/7 and subsequent amputation on 7/13 of her last two toes on the left foot.  She was transferred to SNF on 7/15 for rehab efforts.  It is noted that during that admission she was documented as profoundly deconditioned.   EMS was called at her SNF due to reports of shortness of breath that began 7/20 am.  Her oxygen was increased to 6L and subsequently decreased but the facility felt she should be evaluated and requested EMS transport.  On arrival, she was noted to be altered but in no distress.  EKG showed AFwRVR.  VBG 7.42 / CO2 48.  COVID / influenza testing were negative.  CXR showed cardiomegaly, bilateral pleural effusions, pulmonary edema.  She had LLE swelling and an ultrasound was assessed which was positive for LE DVT.  She had a mild troponin leak thought related to respiratory distress.  Heparin gtt was initiated. CTA of the chest was evaluated to rule out PE and was negative. However, imaging did show an increase in the size of the L apical spiculated pulmonary mass consistent with lung cancer, increase in diffuse patchy ground glass opacities, trace effusions, enlarged pulmonary artery suggestive of PH, cardiomegaly and known abdominal aortic aneurysm (poorly visualized). EKG showed atrial flutter with PVC's.  She was admitted per [facility] for further evaluation.  She was placed on BiPAP for work of breathing but became nauseated and it was removed.  Additional lab work notable for  possible DIC.  PCCM consulted for pulmonary evaluation.    Pertinent  Medical History  Aflutter  CHF  Ascending Aortic Aneurysm PVD - left toe amputations 2022 HTN  COPD - on 2L O2 at baseline  Non-Squamous Non-Small Cell - stage 1B s/p RUL lobectomy 2013, recurrence of disease in LUL s/p stereotatic therapy in 2019, RLL recurrence in 11/2020 -now stage 4 Former Tobacco Abuse   Significant Hospital Events: Including procedures, antibiotic start and stop dates in addition to other pertinent events   7/20 Admit 7/21 PCCM consulted for pulmonary evaluation, assessment of DIC, DVT, and progressive changes to pulmonary nodule in patient with history of adenocarcinoma  Interim History / Subjective:  Currently on 8 L [place] with sat of 97-100%, RR 20-31 Net + 894 with 650 cc UO last 24 hours T max 98.3, WBC 17.8, HGB 9.5 ( Drop from 10.7 overnight ) Platelets are 66,000, Absolute granulocytes, 0.17 Na 148, K 3.8, Creatinine 1.29 ( was 1.35), BUN is 53, Ca 9.0 ( was 9.9) Mag 2.3 Alk Phos 144 ( from 156), AST 43 ( 52), ALT 44 ( 48) GFR 42 Did not tolerate BiPAP>> Nauseated and fear she would vomit  Objective   Blood pressure 95/68, pulse 81, temperature (!) 97.3 F (36.3 C), temperature source Axillary, resp. rate (!) 29, height 5' 7"  (1.702 m), weight 70 kg, SpO2 100 %.        Intake/Output Summary (Last 24 hours) at 01/27/2021 9532 Last data filed at 01/27/2021  0600 Gross per 24 hour  Intake 887.92 ml  Output 650 ml  Net 237.92 ml   Filed Weights   01/22/2021 1604  Weight: 70 kg    Examination: General: elderly adult female lying in bed in NAD, sleepy HEENT: MM pink/moist, anicteric, [place] O2 at 8 L Neuro: Sleepy , she is not folowing commands for me, confused intermittently, generalized weakness CV: s1s2 irr irr, AF on monitor, no m/r/g PULM: Bilaterl chest excursion, mild abdominal accessory muscle use, lungs coarse bilaterally with occasional rhonchi, diminished per bases GI:  soft, bsx4 active  Extremities: warm/dry, trace LE edema, left foot wrapped, dusky 2nd/3rd toe with cap refill >4 seconds/sluggish , L 4th and 5th toes surgically removed. Large bruise to Right upper arm where labs were drawn this morning.  Skin: no rashes or lesions  Resolved Hospital Problem list     Assessment & Plan:   Acute on Chronic Hypoxic Respiratory Failure  Tagrisso, ? Contributing to pulmonary issues COPD  Multifactorial in the setting of underlying COPD, recurrent adenocarcinoma of the lung, deconditioning and malnutrition.  Increase in size of spiculated left apical mass, ground glass opacities on CT. Negative for PE.  -continue empiric therapy for possible CAP > rocephin + azithro  -pulmonary hygiene - IS, mobilize, Flutter -continue solumedrol 40 mg BID  -O2 to support sats > 90% , wean oxygen as able -follow intermittent CXR  - ABG prn for increased somnolence   Recurrent Adenocarcinoma of the Lung  Previously treated with SBRT Now considered to be stage IV on Tagrisso  -defer therapy options to [place] -hold home [place].   AF/Flutter -tele monitoring  - Heparin gtt per pharmacy - cardizem gtt  - Maintain Mag and K > 2.0 and 4.0 respectively - trend Mag daily  Heart Failure EF 20-25% LV with global hypokinesis, hypertrophy Lactate 2.7 on 7/21 Plan Place Foley Cath for strict I&O CXR in am and prn Trend BNP Check Lactate to ensure clearing Diurese as needed Consider cardiology consult  Acute DVT LLE  -continue anticoagulation  -PPI while on anticoagulation   DIC  Suspect related to underlying malignancy, but must also consider known aneurysm, and sepsis.  Doubt HIT due to timing of heparin administration window.  COVID negative.  PCT negative.  -supportive care -continue heparin for now -follow up cultures  -abx as above  - Trend CBC, platelets  Thrombocytopenia Plan Trend CBC, platelets Monitor for bleeding/ worsening bruising Transfuse  platelets as needed  Urinary retention No spontaneous void x > 24 hours I&O cath with platelets of 66K Plan Place Foley cath  Best Practice (right click and "Reselect all SmartList Selections" daily)   Diet/type: NPO DVT prophylaxis: systemic heparin GI prophylaxis: PPI Lines: N/A Foley:  N/A Code Status:  full code Last date of multidisciplinary goals of care discussion:  Reviewed patients medical history with daughter, current respiratory burden, DIC, dusky toes and AF/flutter.  We reviewed that most likely, her current issues are cumulative from recent illnesses with prolonged immobilization, poor intake, surgery and recurrent lung cancer.  She indicates the patient has previously expressed that she would not want to be on mechanical ventilation.  Her daughter is hopeful she will recover but understands that she may be in a difficult place where she will not be able to recover & this may mean end of life.  We discussed the concept of proceeding forward with all interventions that do not come with great discomfort for Mrs. Tollison.  We also discussed no  CPR / no ventilation and [name] notes that she will need to speak to her family (patient has 3 brothers and 2 other sons).    Ongoing discussions regarding goals of care . Consider Palliation Referral    Addendum: I spoke with patient's daughter. I have updated her on the patients condition. She was wanting to understand why the patient's oral chemo had been stopped . We discussed that inpatient oncology recommended holding patient's Tagrisso. It can potentially cause worsening cardiomyopathy, and has pulmonary toxicity and therefore risk outweighs current benefit of therapy in setting of heart failure and respiratory failure. .  We also discussed that she has had progression of suspected recurrent lung cancer despite therapy. I reviewed the CT and PET scan with her. She understands this is a tenuous situation.  The patient's daughter is also  very concerned about patient's nutrition. She has not had what she feels to be adequate nutrition since her surgery. She has a swallow evaluation this am, which she did not pass. We discussed that the only option will be to place a feeding tube for nutrition. The daughter would like for [place] to place a feeding tube.  I have spoken with Dr. Ander Slade about this. We will place a feeding tube ( as long as she can tolerate the procedure with her low platelet count) and start trickle feeds x 24 hours. If she is tolerating this then we can slowly advance to goal. Dietitian has been consulted. Orders have been placed.  Patient's daughter verbalized understanding of the above.   Labs   CBC: Recent Labs  Lab 01/24/21 0958 01/28/2021 1406 01/14/2021 2100 01/26/21 0100 01/27/21 0302  WBC 15.8* 15.2*  --  17.9* 17.8*  NEUTROABS 12.1* 10.7*  --   --  13.6*  HGB 10.5* 11.5*  --  10.7* 9.5*  HCT 30.5* 35.3*  --  32.5* 29.3*  MCV 91.3 95.4  --  95.3 96.7  PLT 98* 73* 76* 72* 66*    Basic Metabolic Panel: Recent Labs  Lab 01/24/21 0958 02/03/2021 1406 01/26/21 0100 01/26/21 0855 01/27/21 0302  NA 146* 146* 146* 147* 148*  K 3.1* 3.5 3.3* 3.5 3.8  CL 103 103 105 107 109  CO2 28 29 29 30 26   GLUCOSE 147* 141* 118* 99 118*  BUN 34* 48* 49* 53* 53*  CREATININE 0.95 1.11* 1.24* 1.35* 1.29*  CALCIUM 10.1 10.8* 9.9 9.6 9.0  MG  --   --   --   --  2.3   GFR: Estimated Creatinine Clearance: 33.8 mL/min (A) (by C-G formula based on SCr of 1.29 mg/dL (H)). Recent Labs  Lab 01/24/21 0958 01/19/2021 1406 02/03/2021 2056 02/05/2021 2100 01/26/21 0100 01/27/21 0302  PROCALCITON  --   --   --  <0.10  --   --   WBC 15.8* 15.2*  --   --  17.9* 17.8*  LATICACIDVEN  --   --  2.1*  --  2.7*  --     Liver Function Tests: Recent Labs  Lab 01/24/21 0958 01/21/2021 1406 01/26/21 0855 01/27/21 0302  AST 55* 58* 52* 43*  ALT 58* 66* 48* 44  ALKPHOS 202* 219* 156* 144*  BILITOT 1.3* 1.5* 0.9 1.0  PROT 6.5 7.2 5.6*  5.5*  ALBUMIN 3.2* 3.8 3.0* 2.8*   No results for input(s): LIPASE, AMYLASE in the last 168 hours. No results for input(s): AMMONIA in the last 168 hours.  ABG    Component Value Date/Time   PHART 7.438 (  H) 09/25/2011 0423   PCO2ART 42.3 09/25/2011 0423   PO2ART 72.4 (L) 09/25/2011 0423   HCO3 30.6 (H) 01/11/2021 1406   TCO2 29.4 09/25/2011 0423   O2SAT 17.0 01/31/2021 1406     Coagulation Profile: Recent Labs  Lab 01/23/2021 2100 01/26/21 0855  INR 3.2* 2.0*    Cardiac Enzymes: No results for input(s): CKTOTAL, CKMB, CKMBINDEX, TROPONINI in the last 168 hours.  HbA1C: No results found for: HGBA1C  CBG: No results for input(s): GLUCAP in the last 168 hours.  Social History:   reports that she has been smoking cigarettes. She has a 6.00 pack-year smoking history. She has never used smokeless tobacco. She reports current alcohol use. She reports that she does not use drugs.   Family History:  Her family history includes Heart disease in her brother, mother, and sister; Hypertension in her mother. There is no history of Colon cancer.   Allergies Allergies  Allergen Reactions   Ibuprofen Nausea And Vomiting and Other (See Comments)    "Allergic," per MAR   Powder Rash and Other (See Comments)    NO gloves with powder- "Allergic," per [facility]     Home Medications  Prior to Admission medications   Medication Sig Start Date End Date Taking? Authorizing Provider  acetaminophen (TYLENOL) 500 MG tablet Take 500 mg by mouth every 6 (six) hours as needed for mild pain.   Yes [provider]  alendronate (FOSAMAX) 70 MG tablet Take 70 mg by mouth every Monday.  07/06/14  Yes [provider]  aspirin EC 81 MG EC tablet Take 1 tablet (81 mg total) by mouth daily. Swallow whole. 01/15/21  Yes Setzer, Edman Circle, PA-C  atorvastatin (LIPITOR) 40 MG tablet Take 1 tablet (40 mg total) by mouth daily. Patient taking differently: Take 40 mg by mouth at bedtime. 01/15/21  Yes  Setzer, Edman Circle, PA-C  clopidogrel (PLAVIX) 75 MG tablet Take 1 tablet (75 mg total) by mouth daily. 01/20/21  Yes Rhyne, Hulen Shouts, PA-C  glucosamine-chondroitin 500-400 MG tablet Take 1 tablet by mouth daily.   Yes [provider]  hydrochlorothiazide (HYDRODIURIL) 12.5 MG tablet Take 6.25 mg by mouth daily.   Yes [provider]  HYDROcodone-acetaminophen (NORCO/VICODIN) 5-325 MG tablet Take 1 tablet by mouth every 6 (six) hours as needed. Patient taking differently: Take 1 tablet by mouth every 6 (six) hours as needed (for pain). 01/20/21  Yes Rhyne, Hulen Shouts, PA-C  meclizine (ANTIVERT) 12.5 MG tablet Take 12.5 mg by mouth daily as needed for dizziness.   Yes [provider]  metoprolol succinate (TOPROL-XL) 50 MG 24 hr tablet Take 25 mg by mouth in the morning. Take with or immediately following a meal.   Yes [provider]  Multiple Vitamins-Iron (MULTIVITAMINS WITH IRON) TABS Take 1 tablet by mouth daily.   Yes [provider]  Omega-3 Fatty Acids (FISH OIL) 500 MG CAPS Take 500 mg by mouth daily.    Yes [provider]  osimertinib mesylate (TAGRISSO) 80 MG tablet Take 1 tablet (80 mg total) by mouth daily. 12/15/20  Yes Curt Bears, MD  OXYGEN Inhale 2 L/min into the lungs See admin instructions. "2 L/min every day and night shift to keep O2 sats ABOVE 90%"   Yes [provider]  OYSTER SHELL CALCIUM + D 500-200 MG-UNIT TABS Take 1 tablet by mouth 3 (three) times daily.   Yes [provider]  pantoprazole (PROTONIX) 20 MG tablet Take 20 mg by mouth  daily before breakfast.   Yes [provider]  potassium chloride SA (KLOR-CON) 20 MEQ tablet Take 1 tablet (20 mEq total) by mouth daily. 01/24/21  Yes Heilingoetter, Cassandra L, PA-C  prochlorperazine (COMPAZINE) 10 MG tablet Take 1 tablet (10 mg total) by mouth every 6 (six) hours as needed. Patient taking differently: Take 10 mg by mouth every 6 (six) hours  as needed for nausea or vomiting. 01/24/21  Yes Heilingoetter, Cassandra L, PA-C  vitamin B-12 (CYANOCOBALAMIN) 500 MCG tablet Take 500 mcg by mouth daily.   Yes [provider]     Critical care time: 78 minutes    Magdalen Spatz, MSN, AGACNP-BC [place] for personal pager PCCM on call pager [phone]  [facility] 01/27/2021, 8:21 AM   From 7A-7P if no response, please call [phone]>> Hospital Use only After hours, please call [facility] Use Only

## 2021-01-27 NOTE — Progress Notes (Signed)
Swallow evaluation conducted, full report to follow.  Pt currently is grossly deconditioned, nearly aphonic with diaphragmatic breathing - attempting 2-3 breathy words with excessive effort.  She did accept po intake including single ice chips, nectar thick via tsp/cup, thin via tsp, and applesauce x2 small boluses.  She demonstrates significant delayed swallow with several respiratory cycles completed with bolus presumed to be retained in oral cavity.  Delays with po varied from 8 seconds to 46 seconds and were not consistent with particular consistencies. She also required dry spoon stimulation and cues to swallow - ? Potential pooling in pharynx without sensation as pt indicated swallow was elicited and this was not observed. Pt would close her eyes several times during the session and required cues to awake.  RR varied from 23 at rest up to 37 with 9 boluses total.  Pt follows directions but is grossly weak, concerning for airway protection ability.  Note plan for palliative referral.  Given concern for potential Recurrent Laryngeal Nerve involvement, impaired mentation, respiratory status and gross deconditioning, she is high risk for asp pna and silent nature of dysphagia.  Pt did verbalize "dry" and was willing to consume ice chips.    Recommend pending palliative meeting pt be NPO x single ice chips, tsps of thin water, prefer medications via IV but necessary po advise with very small amount of applesauce.    Will follow, educated; family not present at this time.  Educated pt to recommendations,  uncertain to her ability to comprehend however.   Kathleen Lime, MS [facility] SLP Acute Rehab Services Office [phone] Pager [phone]

## 2021-01-27 NOTE — Progress Notes (Signed)
2 separate times made to place cortrak  Despite numbing nasal passages with lidocaine  Patient remains very uncooperative and could not safely and successfully place cortrak at present-risk outweighs benefit at present With patient's degree of agitation, likely would have pulling tube out he is also very high which places her at risk of aspiration  Will monitor  Will reattempt when we can gain patient's cooperation

## 2021-01-27 NOTE — Progress Notes (Addendum)
Nutrition Follow-up  DOCUMENTATION CODES:   Non-severe (moderate) malnutrition in context of chronic illness  INTERVENTION:  - once small bore NGT determined to safely be in place: Osmolite 1.5 @ 20 ml/hr for the first 24 hours then advance by 10 ml every 8 hours to reach goal rate of 60 ml/hr with 45 ml Prosource TF BID. - at goal rate, this regimen will provide 2240 kcal, 112 grams protein, and 1097 ml free water. - free water flush per CCM.    NUTRITION DIAGNOSIS:   Moderate Malnutrition related to chronic illness, cancer and cancer related treatments as evidenced by energy intake < 75% for > or equal to 1 month, moderate muscle depletion. -ongoing  GOAL:   Patient will meet greater than or equal to 90% of their needs -unable to meet at this time.  MONITOR:   TF tolerance, Diet advancement, Labs, Weight trends, Skin  REASON FOR ASSESSMENT:   Consult Enteral/tube feeding initiation and management  ASSESSMENT:   80 year old female with medical history of stage 4 NSCLC, prior transient A. fib not on anticoagulation, HTN, PAD with recent amputation of 2 toes on the L foot due to necrosis last week, and CHF. She presented to the ED with worsening SOB, increased O2 requirements (needing at least 6L at facility). She has had progressive decline over the past 1 month. CT angio chest in the ED showed concern for worsening lung cancer; lower extremity dopplers showed LLE DVT. She was started on IV abx d/t possible PNA. Patient placed on BiPAP initially but discontinued d/t nausea and possible emesis. Oncology and Palliative Care have been consulted.  She remains NPO following SLP evaluation this AM. Pending small bore NGT placement although 2 attempts a short time ago were unsuccessful (see Dr. Judson Roch note for details on this).   Note in consult to RD outlines desire for trickle-rate TF x24 hours and then advance to goal as tolerated.    Weight today is -3.5 lb compared to weight on  7/20. Non-pitting edema to RUE and BLE documented in the edema section of flow sheet.   Pending Palliative Care for [place].    Labs reviewed; Na: 148 mmol/l, BUN: 53 mg/dl, creatinine: 1.29 mg/dl, Alk Phos elevated (trending down), AST slightly elevated (trending down), GFR: 42 mg/dl.  Medications reviewed; 1000 mcg subcutaneous cyanocobalamin/day, 40 mg solu-medrol BID, 40 mg IV protonix/day, 20 mEq Klor-Con x1 dose 7/22. IVF; D5 @ 50 ml/hr (204 kcal/24 hrs).    Diet Order:   Diet Order             Diet NPO time specified Except for: Sips with Meds  Diet effective now                   EDUCATION NEEDS:   Not appropriate for education at this time  Skin:  Skin Assessment: Skin Integrity Issues: Skin Integrity Issues:: Stage II, Incisions Stage II: sacrum Incisions: L foot (7/13)  Last BM:  PTA/unknown  Height:   Ht Readings from Last 1 Encounters:  01/07/2021 5' 7"  (1.702 m)    Weight:   Wt Readings from Last 1 Encounters:  01/27/21 68.3 kg      Estimated Nutritional Needs:  Kcal:  2000-2200 kcal Protein:  110-110 grams Fluid:  >/= 2 L/day       Jarome Matin, MS, RD, LDN, CNSC Inpatient Clinical Dietitian RD pager # available in [place]  After hours/weekend pager # available in [facility]

## 2021-01-27 NOTE — Evaluation (Signed)
Clinical/Bedside Swallow Evaluation Patient Details  Name: Heather [name] MRN: [id number] Date of Birth: [birth year]  Today's Date: 01/27/2021 Time: SLP Start Time (ACUTE ONLY): 0750 SLP Stop Time (ACUTE ONLY): 0810 SLP Time Calculation (min) (ACUTE ONLY): 20 min  Past Medical History:  Past Medical History:  Diagnosis Date   Ascending aortic aneurysm ([place]) 07/20/2014   4.1 cm by CT   Atrial fib/flutter, transient    post-op   CHF (congestive heart failure) ([place])    a. EF 30-35% by echo in 2013 --> similar results by repeat imaging in 2016 and 11/2017.   Constipation    corrects with diet   Floater, vitreous    Right   Hypertension    Nonsquamous nonsmall cell neoplasm of lung ([place]) 09/2011   Stage IB- s/p right upper lobectomy   Pneumonia    Psoriasis    Ankles   Tobacco abuse    Past Surgical History:  Past Surgical History:  Procedure Laterality Date   ABDOMINAL AORTOGRAM W/LOWER EXTREMITY N/A 01/12/2021   Procedure: ABDOMINAL AORTOGRAM W/LOWER EXTREMITY;  Surgeon: Marty Heck, MD;  Location: [facility];  Service: Cardiovascular;  Laterality: N/A;   AMPUTATION Left 01/18/2021   Procedure: LEFT FOURTH AND FIFTH TOE AMPUTATION;  Surgeon: Cherre Robins, MD;  Location: [facility];  Service: Vascular;  Laterality: Left;   CATARACT EXTRACTION Bilateral 2016   COLONOSCOPY  01/22/2012   Procedure: COLONOSCOPY;  Surgeon: Danie Binder, MD;  [facility];  Service: Endoscopy;  Laterality: N/A;  10:45   COLONOSCOPY N/A 04/15/2017   Four 4-6 mm polyps in descending colon, ascending, and cecum. One 15 mm polyp in proximal ascending colon, one 4 mm poylp at hepatic flexure, two 3-4 mm polyps in sigmoid and descending. Internal hemorrhoids. Colonoscopy in 3-5 years if benefits outweigh risks. Simple adenomas.    IR RADIOLOGIST EVAL & MGMT  03/04/2018   PERIPHERAL VASCULAR INTERVENTION Left 01/12/2021   Procedure: PERIPHERAL VASCULAR INTERVENTION;  Surgeon: Marty Heck, MD;  Location: [facility];  Service: Cardiovascular;  Laterality: Left;  sfa   POLYPECTOMY  04/15/2017   Procedure: POLYPECTOMY;  Surgeon: Danie Binder, MD;  [facility];  Service: Endoscopy;;  ascending colon   RADIOLOGY WITH ANESTHESIA N/A 03/19/2018   Procedure: CT MICROWAVE THERMAL ABLATION WITH ANESTHESIA;  Surgeon: Jacqulynn Cadet, MD;  Location: [facility];  Service: Anesthesiology;  Laterality: N/A;   right VATS (upper lobectomy)  08/29/11   Dr Roxan Hockey   VIDEO BRONCHOSCOPY WITH ENDOBRONCHIAL NAVIGATION N/A 03/18/2015   Procedure: VIDEO BRONCHOSCOPY WITH ENDOBRONCHIAL NAVIGATION;  Surgeon: [name], MD;  Location: [facility];  Service: Thoracic;  Laterality: N/A;   VIDEO BRONCHOSCOPY WITH ENDOBRONCHIAL NAVIGATION N/A 11/17/2020   Procedure: VIDEO BRONCHOSCOPY WITH ENDOBRONCHIAL NAVIGATION;  Surgeon: [name], MD;  Location: [facility];  Service: Thoracic;  Laterality: N/A;   HPI:  80 yo female resident of SNF adm [facility] with decreased o2 sats and dyspnea.  Pt has h/o Lung cancer on tx - recurrent adenocarcinoma of the lung, deconditioning and malnutrition.  Increase in size of spiculated left apical mass, ground glass opacities on CT. Negative for PE.CHF. Also has h/o tobacco use, smoker. She has not been able to tolerate Bipap due to nausea per MD notes. Pt is full code but CCM advised to be DNR.  Swallow evaluation ordered. Pt admits to coughing with po intake via yes/no questions.   Assessment / Plan /  Recommendation Clinical Impression  Pt currently is grossly deconditioned, nearly aphonic with diaphragmatic breathing - attempting 2-3 breathy words with excessive effort.  She did accept po intake including single ice chips, nectar thick via tsp/cup, thin via tsp, and applesauce x2 small boluses.  She demonstrates significant delayed swallow with several respiratory cycles completed with bolus presumed to be retained in oral cavity.  Delays  with po varied from 8 seconds to 46 seconds and were not consistent with particular consistencies. She also required dry spoon stimulation and cues to swallow - ? Potential pooling in pharynx without sensation as pt indicated swallow was elicited and this was not observed. Pt would close her eyes several times during the session and required cues to awake.  RR varied from 23 at rest up to 37 with 9 boluses total.  Pt follows directions but is grossly weak, concerning for airway protection ability.  Note plan for palliative referral.    Given concern for potential Recurrent Laryngeal Nerve involvement, impaired mentation, respiratory status and gross deconditioning, she is high risk for asp pna and silent nature of dysphagia.  Pt did verbalize "dry" and was willing to consume ice chips.          Recommend pending palliative meeting pt be NPO x single ice chips, tsps of thin water, prefer medications via IV but necessary po advise with very small amount of applesauce.       Will follow, educated; family not present at this time.  Educated pt to recommendations,  uncertain to her ability to comprehend however. SLP Visit Diagnosis: Dysphagia, oropharyngeal phase (R13.12);Dysphagia, unspecified (R13.10)    Aspiration Risk  Severe aspiration risk;Risk for inadequate nutrition/hydration    Diet Recommendation NPO;Ice chips PRN after oral care;Free water protocol after oral care   Liquid Administration via: Spoon Medication Administration: Crushed with puree (very small bolus) Supervision: Staff to assist with self feeding Compensations: Slow rate;Small sips/bites Postural Changes: Seated upright at 90 degrees;Remain upright for at least 30 minutes after po intake    Other  Recommendations Oral Care Recommendations: Oral care QID   Follow up Recommendations Other (comment) (tbd)      Frequency and Duration min 1 x/week  1 week       Prognosis Prognosis for Safe Diet Advancement: Guarded Barriers  to Reach Goals: Severity of deficits;Time post onset;Other (Comment) (medical diagnosis)      Swallow Study   General Date of Onset: 01/27/21 HPI: 80 yo female resident of SNF adm to [facility] with decreased o2 sats and dyspnea.  Pt has h/o Lung cancer on tx - recurrent adenocarcinoma of the lung, deconditioning and malnutrition.  Increase in size of spiculated left apical mass, ground glass opacities on CT. Negative for PE.CHF. Also has h/o tobacco use, smoker. She has not been able to tolerate Bipap due to nausea per MD notes. Pt is full code but CCM advised to be DNR.  Swallow evaluation ordered. Pt admits to coughing with po intake via yes/no questions. Type of Study: Bedside Swallow Evaluation Diet Prior to this Study: NPO Temperature Spikes Noted: No Respiratory Status: Nasal cannula History of Recent Intubation: No Behavior/Cognition: Lethargic/Drowsy;Requires cueing Oral Cavity Assessment: Dry Oral Care Completed by SLP: No Oral Cavity - Dentition: Edentulous Self-Feeding Abilities: Total assist Patient Positioning: Upright in bed Baseline Vocal Quality: Low vocal intensity;Suspected CN X (Vagus) involvement;Other (comment) (nearly aphonic, whispered phonation) Volitional Cough: Weak Volitional Swallow: Unable to elicit    Oral/Motor/Sensory Function Overall Oral Motor/Sensory Function: Generalized oral  weakness   Ice Chips Ice chips: Impaired Pharyngeal Phase Impairments: Suspected delayed Swallow;Throat Clearing - Delayed   Thin Liquid Thin Liquid: Impaired Presentation: Spoon Oral Phase Impairments: Reduced labial seal;Other (comment) Oral Phase Functional Implications: Left anterior spillage;Right anterior spillage;Prolonged oral transit Pharyngeal  Phase Impairments: Suspected delayed Swallow;Change in Vital Signs    Nectar Thick Nectar Thick Liquid: Impaired Presentation: Cup;Spoon Oral phase functional implications: Oral holding Pharyngeal Phase Impairments: Suspected  delayed Swallow;Change in Vital Signs   Honey Thick Honey Thick Liquid: Not tested   Puree Puree: Impaired Oral Phase Impairments: Reduced lingual movement/coordination Oral Phase Functional Implications: Prolonged oral transit Pharyngeal Phase Impairments: Suspected delayed Swallow;Change in Vital Signs   Solid     Solid: Not tested      Macario Golds 01/27/2021,10:05 AM  Kathleen Lime, MS [facility] SLP [place] Office [phone] Pager [phone]

## 2021-01-27 NOTE — Progress Notes (Addendum)
[place] for IV heparin Indication: New Afib/RVR, + DVT  Allergies  Allergen Reactions   Ibuprofen Nausea And Vomiting and Other (See Comments)    "Allergic," per MAR   Powder Rash and Other (See Comments)    NO gloves with powder- "Allergic," per [facility]    Patient Measurements: Height: 5\' 7"  (170.2 cm) Weight: 70 kg (154 lb 5.2 oz) IBW/kg (Calculated) : 61.6 Heparin Dosing Weight: TBW (used [place] nomogram for initial dosing)  Vital Signs: Temp: 97.1 F (36.2 C) (07/22 0332) Temp Source: Axillary (07/22 0332) BP: 95/68 (07/22 0700) Pulse Rate: 81 (07/22 0700)  Labs: Recent Labs    01/23/2021 1406 01/24/2021 1820 01/31/2021 2100 01/26/21 0100 01/26/21 0855 01/27/21 0302  HGB 11.5*  --   --  10.7*  --  9.5*  HCT 35.3*  --   --  32.5*  --  29.3*  PLT 73*  --  76* 72*  --  66*  APTT  --   --  >200*  --  172*  --   LABPROT  --   --  32.6*  --  22.7*  --   INR  --   --  3.2*  --  2.0*  --   HEPARINUNFRC  --   --   --  0.42 0.49 0.60  CREATININE 1.11*  --   --  1.24* 1.35* 1.29*  TROPONINIHS 177* 165*  --   --   --   --      Estimated Creatinine Clearance: 33.8 mL/min (A) (by C-G formula based on SCr of 1.29 mg/dL (H)).   Medical History: Past Medical History:  Diagnosis Date   Ascending aortic aneurysm ([place]) 07/20/2014   4.1 cm by CT   Atrial fib/flutter, transient    post-op   CHF (congestive heart failure) ([place])    a. EF 30-35% by echo in 2013 --> similar results by repeat imaging in 2016 and 11/2017.   Constipation    corrects with diet   Floater, vitreous    Right   Hypertension    Nonsquamous nonsmall cell neoplasm of lung ([place]) 09/2011   Stage IB- s/p right upper lobectomy   Pneumonia    Psoriasis    Ankles   Tobacco abuse    Scheduled:   atorvastatin  40 mg Oral QHS   Chlorhexidine Gluconate Cloth  6 each Topical Daily   cyanocobalamin  1,000 mcg Subcutaneous Daily   mouth rinse  15 mL Mouth Rinse BID    methylPREDNISolone (SOLU-MEDROL) injection  40 mg Intravenous Q12H   metoprolol tartrate  12.5 mg Oral BID   pantoprazole (PROTONIX) IV  40 mg Intravenous Q24H    Assessment: 58 yoF with PMH HTN, transient Afib/flutter, AAA, lung cancer on oral chemo, who was recently admitted 7/5 - 7/15 for amputation of ischemic L 5th toe and subsequently sent to rehab, now presenting with SOB and found to be in [place]. 01/11/2021 - Also concern for DIC as patient with elevated PTT > 200, fibrinogen of < 60, D-dimer of 17.35, smear with schistocytes noted.  BLE doppler positive for L peroneal DVT;  CTA : negative PE.   Baseline INR, aPTT: not done, aPTTs resulted after Heparin started Prior anticoagulation: none, does take ASA + Plavix PTA Baseline INR elevated 3.2  Today, 01/27/2021: 0302 Heparin level = 0.6 (therapeutic) with heparin infusing at 1000 units/hr CBC: Hgb low-stable,  Plt decr to 66K (182 on 7/13, 98 on 7/19 day before heparin  started-33% drop) SCr elevated at 1.29 Noted hematoma RUE where IV site pulled 7/20, no further bleed S/P 7/13 L toe amputations, 4th & 5th: RN noted some oozing No bleeding or line interruptions reported from nurse   Goal of Therapy: Heparin level 0.3-0.7 units/ml Monitor platelets by anticoagulation protocol: Yes  Plan: Continue Heparin 1000 units/hr IV infusion Daily CBC, daily heparin level Monitor for signs of bleeding or thrombosis, watch Plt closely  Ziv Welchel P. Legrand Como, PharmD, [place] Please utilize Amion for appropriate phone number to reach the unit pharmacist ([place]) 01/27/2021 7:31 AM

## 2021-01-28 ENCOUNTER — Inpatient Hospital Stay (HOSPITAL_COMMUNITY): Payer: Medicare Other

## 2021-01-28 DIAGNOSIS — Z7189 Other specified counseling: Secondary | ICD-10-CM | POA: Diagnosis not present

## 2021-01-28 DIAGNOSIS — C3492 Malignant neoplasm of unspecified part of left bronchus or lung: Secondary | ICD-10-CM | POA: Diagnosis not present

## 2021-01-28 DIAGNOSIS — Z515 Encounter for palliative care: Secondary | ICD-10-CM

## 2021-01-28 DIAGNOSIS — D65 Disseminated intravascular coagulation [defibrination syndrome]: Secondary | ICD-10-CM | POA: Diagnosis not present

## 2021-01-28 DIAGNOSIS — I509 Heart failure, unspecified: Secondary | ICD-10-CM | POA: Diagnosis not present

## 2021-01-28 DIAGNOSIS — I82452 Acute embolism and thrombosis of left peroneal vein: Secondary | ICD-10-CM | POA: Diagnosis not present

## 2021-01-28 DIAGNOSIS — J9621 Acute and chronic respiratory failure with hypoxia: Secondary | ICD-10-CM | POA: Diagnosis not present

## 2021-01-28 DIAGNOSIS — I5023 Acute on chronic systolic (congestive) heart failure: Secondary | ICD-10-CM | POA: Diagnosis not present

## 2021-01-28 LAB — CBC WITH DIFFERENTIAL/PLATELET
Abs Immature Granulocytes: 0.37 10*3/uL — ABNORMAL HIGH (ref 0.00–0.07)
Basophils Absolute: 0 10*3/uL (ref 0.0–0.1)
Basophils Relative: 0 %
Eosinophils Absolute: 0 10*3/uL (ref 0.0–0.5)
Eosinophils Relative: 0 %
HCT: 27.2 % — ABNORMAL LOW (ref 36.0–46.0)
Hemoglobin: 8.6 g/dL — ABNORMAL LOW (ref 12.0–15.0)
Immature Granulocytes: 2 %
Lymphocytes Relative: 19 %
Lymphs Abs: 4.8 10*3/uL — ABNORMAL HIGH (ref 0.7–4.0)
MCH: 32 pg (ref 26.0–34.0)
MCHC: 31.6 g/dL (ref 30.0–36.0)
MCV: 101.1 fL — ABNORMAL HIGH (ref 80.0–100.0)
Monocytes Absolute: 0.6 10*3/uL (ref 0.1–1.0)
Monocytes Relative: 2 %
Neutro Abs: 19.3 10*3/uL — ABNORMAL HIGH (ref 1.7–7.7)
Neutrophils Relative %: 77 %
Platelets: 96 10*3/uL — ABNORMAL LOW (ref 150–400)
RBC: 2.69 MIL/uL — ABNORMAL LOW (ref 3.87–5.11)
RDW: 18.5 % — ABNORMAL HIGH (ref 11.5–15.5)
WBC: 25 10*3/uL — ABNORMAL HIGH (ref 4.0–10.5)
nRBC: 1.2 % — ABNORMAL HIGH (ref 0.0–0.2)

## 2021-01-28 LAB — DIC (DISSEMINATED INTRAVASCULAR COAGULATION)PANEL
D-Dimer, Quant: 4.08 ug/mL-FEU — ABNORMAL HIGH (ref 0.00–0.50)
Fibrinogen: 131 mg/dL — ABNORMAL LOW (ref 210–475)
INR: 1.7 — ABNORMAL HIGH (ref 0.8–1.2)
Platelets: 95 10*3/uL — ABNORMAL LOW (ref 150–400)
Prothrombin Time: 19.6 seconds — ABNORMAL HIGH (ref 11.4–15.2)
Smear Review: NONE SEEN
aPTT: 176 seconds (ref 24–36)

## 2021-01-28 LAB — COMPREHENSIVE METABOLIC PANEL
ALT: 39 U/L (ref 0–44)
AST: 33 U/L (ref 15–41)
Albumin: 2.9 g/dL — ABNORMAL LOW (ref 3.5–5.0)
Alkaline Phosphatase: 129 U/L — ABNORMAL HIGH (ref 38–126)
Anion gap: 8 (ref 5–15)
BUN: 41 mg/dL — ABNORMAL HIGH (ref 8–23)
CO2: 29 mmol/L (ref 22–32)
Calcium: 8.6 mg/dL — ABNORMAL LOW (ref 8.9–10.3)
Chloride: 112 mmol/L — ABNORMAL HIGH (ref 98–111)
Creatinine, Ser: 1.25 mg/dL — ABNORMAL HIGH (ref 0.44–1.00)
GFR, Estimated: 44 mL/min — ABNORMAL LOW (ref >60)
Glucose, Bld: 154 mg/dL — ABNORMAL HIGH (ref 70–99)
Potassium: 3 mmol/L — ABNORMAL LOW (ref 3.5–5.1)
Sodium: 149 mmol/L — ABNORMAL HIGH (ref 135–145)
Total Bilirubin: 0.8 mg/dL (ref 0.3–1.2)
Total Protein: 5.6 g/dL — ABNORMAL LOW (ref 6.5–8.1)

## 2021-01-28 LAB — MAGNESIUM: Magnesium: 2.5 mg/dL — ABNORMAL HIGH (ref 1.7–2.4)

## 2021-01-28 LAB — BRAIN NATRIURETIC PEPTIDE: B Natriuretic Peptide: 342.1 pg/mL — ABNORMAL HIGH (ref 0.0–100.0)

## 2021-01-28 LAB — GLUCOSE, CAPILLARY
Glucose-Capillary: 126 mg/dL — ABNORMAL HIGH (ref 70–99)
Glucose-Capillary: 167 mg/dL — ABNORMAL HIGH (ref 70–99)
Glucose-Capillary: 172 mg/dL — ABNORMAL HIGH (ref 70–99)
Glucose-Capillary: 171 mg/dL — ABNORMAL HIGH (ref 70–99)
Glucose-Capillary: 159 mg/dL — ABNORMAL HIGH (ref 70–99)

## 2021-01-28 LAB — HEPARIN LEVEL (UNFRACTIONATED)
Heparin Unfractionated: 0.8 IU/mL — ABNORMAL HIGH (ref 0.30–0.70)
Heparin Unfractionated: 0.58 IU/mL (ref 0.30–0.70)

## 2021-01-28 LAB — PHOSPHORUS
Phosphorus: 2.7 mg/dL (ref 2.5–4.6)
Phosphorus: 2.1 mg/dL — ABNORMAL LOW (ref 2.5–4.6)

## 2021-01-28 LAB — LACTIC ACID, PLASMA: Lactic Acid, Venous: 2.1 mmol/L (ref 0.5–1.9)

## 2021-01-28 MED ORDER — DEXTROSE 5 % IV SOLN: INTRAVENOUS | Status: DC

## 2021-01-28 MED ORDER — LIP MEDEX EX OINT
TOPICAL_OINTMENT | CUTANEOUS | Status: DC | PRN
  Filled 2021-01-28: qty 7

## 2021-01-28 MED ORDER — POTASSIUM CHLORIDE 10 MEQ/100ML IV SOLN
10.0000 meq | INTRAVENOUS | Status: AC
  Administered 2021-01-28 (×5): 10 meq via INTRAVENOUS
  Filled 2021-01-28 (×5): qty 100

## 2021-01-28 MED ORDER — HEPARIN (PORCINE) 25000 UT/250ML-% IV SOLN
900.0000 [IU]/h | INTRAVENOUS | Status: DC
  Administered 2021-01-28 (×2): 900 [IU]/h via INTRAVENOUS
  Filled 2021-01-28: qty 250

## 2021-01-28 NOTE — Progress Notes (Signed)
  Speech Language Pathology Treatment: Dysphagia  Patient Details Name: Heather [name] MRN: [id number] DOB: Nov 15, 1940 Today's Date: 01/28/2021 Time: 2130-8657 SLP Time Calculation (min) (ACUTE ONLY): 12 min  Assessment / Plan / Recommendation Clinical Impression  Pt was seen for skilled ST targeting PO trials.  Pt was encountered asleep in bed and had difficulty rousing despite max verbal and tactile stimulation.  HOB was elevated prior to oral care and PO trials, and pt's HR was noted to increased from the low 80s to 100.  RR was noted to range from 25-31 at rest in the absence of PO, placing her at an increased risk for aspiration.  Oral care was completed without difficulty, but patient was noted to have limited lingual and labial movements in response to oral care.  Pt briefly roused during PO trial.  She was presented with a small ice chip that she accepted passively into her oral cavity.  No appreciated lingual or labial movement, with no attempts at labial closure, AP transit, or swallow initiation.  Bolus was eventually suctioned from the pt's oral cavity secondary to aspiration concerns.  Pt remains at high risk for aspiration with any PO.  Therefore, recommend consideration of short-term alternative means of nutrition and continuation of NPO with frequent oral care.  SLP will f/u per POC.      HPI HPI: 80 yo female resident of SNF adm to [facility] with decreased o2 sats and dyspnea.  Pt has h/o Lung cancer on tx - recurrent adenocarcinoma of the lung, deconditioning and malnutrition.  Increase in size of spiculated left apical mass, ground glass opacities on CT. Negative for PE.CHF. Also has h/o tobacco use, smoker. She has not been able to tolerate Bipap due to nausea per MD notes. Pt is full code but CCM advised to be DNR.  Swallow evaluation ordered. Pt admits to coughing with po intake via yes/no questions.      SLP Plan  Continue with current plan of care       Recommendations  Diet  recommendations: NPO Medication Administration: Via alternative means (crushed with puree if necessary) Compensations: Slow rate;Small sips/bites Postural Changes and/or Swallow Maneuvers:  (fully awake/alert)                Oral Care Recommendations: Oral care QID Follow up Recommendations: 24 hour supervision/assistance SLP Visit Diagnosis: Dysphagia, oropharyngeal phase (R13.12);Dysphagia, unspecified (R13.10) Plan: Continue with current plan of care       [place] M.S., [place] Office: [phone]   [place] 01/28/2021, 9:20 AM

## 2021-01-28 NOTE — Progress Notes (Signed)
PROGRESS NOTE    Heather [name]  WVP:[id number] DOB: 1941-04-25 DOA: 02/01/2021 PCP: Rosita Fire, MD    Chief Complaint  Patient presents with   Shortness of Breath    Brief Narrative: Patient is a 80 year old female history of non-small cell lung cancer, stage IV, prior transient paroxysmal A. fib not on anticoagulation, hypertension, PAD with recent amputation of 2 toes on the left foot due to necrosis last week, chronic systolic CHF with EF 30 to 35% on prior 2D echo presented to the ED with worsening shortness of breath, increased O2 requirements and requiring at least 6 L of O2 at facility.  Patient had presented with worsening shortness of breath.  Patient noted to have a progressive decline over the past month.  Work-up in the ED including CT angiogram chest concerning for worsening non-small cell lung cancer, lower extremity Dopplers done concerning for left lower extremity peroneal DVT and patient started on heparin, DIC panel obtained concerning for DIC.  Patient on Cardizem drip.  Patient placed on empiric IV antibiotics for possible pneumonia.  Patient with worsening respiratory status, trial of BiPAP done on admission discontinued due to nausea and possible emesis.  Oncology consulted.  We will consult with PCCM due to worsening respiratory status, impending DIC.  Palliative care consultation for goals of care.   Assessment & Plan:   Principal Problem:   Acute on chronic respiratory failure with hypoxia (HCC) Active Problems:   DIC (disseminated intravascular coagulation) ([place])   Adenocarcinoma of left lung, stage 4 (HCC)   Atherosclerosis of native artery of left lower extremity with gangrene (HCC)   Atrial fibrillation with RVR (HCC)   Left leg DVT (HCC)   Decubitus ulcer of sacral region, stage 1   Acute on chronic systolic CHF (congestive heart failure) (HCC)   CAP (community acquired pneumonia)   Acute on chronic congestive heart failure (HCC)   Hypokalemia    Malnutrition of moderate degree   1 acute on chronic respiratory failure with hypoxia -Likely multifactorial secondary to worsening non-small cell lung cancer stage IV in the setting of possible CAP, component of acute CHF secondary to A. fib with RVR, possible PAH, concern for Tagrisso pulmonary toxicity. -Patient initially placed on the BiPAP which had to be discontinued due to onset of nausea. -Patient with tachypnea, respiratory distress with increased use of accessory muscles of respiration, high risk for intubation.   -Continue empiric IV antibiotics, IV Solu-Medrol 40 mg every 12 hours, IV PPI, IV Rocephin, IV azithromycin. -Continue scheduled Xopenex, Atrovent nebs, Pulmicort nebs, Brovana nebs. -PCCM consulted and are following.  -Oncology consulted and following. -Patient with poor prognosis seen by palliative care and currently DNR.  -Palliative care to meet with family again today.  -Continue Ativan as needed anxiety, air hunger as ordered per palliative care.    2.  Probable DIC -Questionable etiology, likely secondary to worsening malignancy and possible infection etiology -Concern for DIC as patient with elevated PTT > 200, fibrinogen of < 60, D-dimer of 17.35, smear with schistocytes noted.  Patient on heparin drip secondary to left lower extremity DVT. -Hematology/oncology consulted and following. -DIC panel with improvement with lab work. -PCCM following and appreciate input and recommendations. -Continue IV antibiotics. -Follow.  3.  Suspected CAP -MRSA PCR negative. -COVID-19 PCR negative. -Continue empiric IV azithromycin, IV Rocephin.  4.  A. fib with RVR ( CHA2DS2VASC score 8) -Was on Cardizem drip however discontinued and patient started on IV digoxin per cardiology.  -Per RN patient  unable to tolerate oral pills and as such Toprol-XL changed to oral Lopressor twice daily.  -2D echo with EF of 20 to 25%, left ventricular global hypokinesis, severely dilated  left atrial size, mildly dilated right atrial size, small pericardial effusion present.   -Continue heparin. -Per cardiology.  5.  Acute on chronic systolic CHF -Likely transient secondary to A. fib with RVR. -Patient currently does not look volume overloaded on examination and looks more on the dry side.   -Patient with poor oral intake.   -2D echo with EF of 20 to 25%, left ventricular global hypokinesis, severely dilated left atrial size, mildly dilated right atrial size, small pericardial effusion present.   -Strict I's and O's  -Daily weights.   -Due to worsening EF and patient presentation with acute respiratory distress patient seen in consultation by cardiology.   -Continue gentle hydration.   -Cardizem drip discontinued and patient started on digoxin per cardiology.   -Patient currently n.p.o. as patient with significant aspiration risk.  -Cardiology following and appreciate input and recommendations.    6.  Non-small cell lung cancer -Worsening non-small cell lung cancer per CT scan. -Tagrisso on hold secondary to problem #1.   -Oncology consulted and following and are in agreement with holding Tagrisso at this time.   -Per oncology.  7.  Left lower extremity DVT -CT angiogram chest negative for PE. -Heparin drip.   -Hematology/oncology consulted and following.    8.  Thrombocytopenia -Patient with a DVT left lower extremity. -Thrombocytopenia likely secondary to DIC versus consumption of platelets by DVT.   -Platelet count fluctuating and trending back up. -Continue heparin drip.   -Hematology/oncology following.   9.  Left lower extremity gangrene of the toes -Status post amputation of fourth and fifth toes last week. -Second and third toes with bluish discoloration. -Monitor closely. -Continue wound dressing. -Monitor over the next 24 hours and depending on clinical trajectory and palliative care consultation may consider involvement with vascular surgery, however  patient currently pretty ill at this time and unsure as to whether patient might be able to tolerate the procedure. -Follow.  10.  Hypertension -Blood pressure was soft but improving with gentle hydration.  -Continue Lopressor.    11.  Hypokalemia -Potassium at 3.0.   -KCl 10 mEq every hour x 5 runs  12.  Dysphagia -Patient assessed by speech therapy and patient noted to be at severe aspiration risk at this time and recommending n.p.o. with ice chips, teaspoons of thin water.  Cortrack ordered however patient noted not to be able to tolerate it yesterday.   -Palliative care following.  14.  Hypernatremia -Likely secondary to volume depletion and dehydration. -Increase D5W to 100 cc an hour. -Repeat labs in the morning.    DVT prophylaxis: Heparin drip Code Status: Full Family Communication: Updated patient, no family at bedside.. Disposition:   Status is: Inpatient  Remains inpatient appropriate because:IV treatments appropriate due to intensity of illness or inability to take PO  Dispo: The patient is from: SNF              Anticipated d/c is to:  TBD              Patient currently is not medically stable to d/c.   Difficult to place patient No       Consultants:  Oncology PCCM: Dr.Olalere 01/26/2021 Cardiology Dr. Oval Linsey 01/27/2021 Palliative care: Dr. Rowe Pavy 01/27/2021  Procedures:  CT angiogram chest 02/05/2021 Chest x-ray 01/07/2021, 01/26/2021 Lower extremity Doppler  02/04/2021    Antimicrobials:  IV azithromycin 01/14/2021>>>> IV Rocephin 01/21/2021>>>>   Subjective: Sleeping but arousable.  On 10 L high flow nasal cannula and tachypneic.  Denies chest pain.  Off Cardizem drip.    Objective: Vitals:   01/28/21 0800 01/28/21 0828 01/28/21 0830 01/28/21 0930  BP: 139/90   125/77  Pulse:    93  Resp: (!) 28     Temp:  97.9 F (36.6 C)    TempSrc:  Axillary    SpO2:   100%   Weight:      Height:        Intake/Output Summary (Last 24 hours) at  01/28/2021 1006 Last data filed at 01/28/2021 0500 Gross per 24 hour  Intake 1487.53 ml  Output 725 ml  Net 762.53 ml    Filed Weights   01/16/2021 1604 01/27/21 0814 01/28/21 0500  Weight: 70 kg 68.3 kg 68 kg    Examination:  General exam: On 10 L high flow nasal cannula.  Some use of accessory muscles of respiration.  Respiratory system: Tachypnea.  Some diffuse coarse breath sounds anterior lung fields.  No wheezing noted.  Some use of accessory muscles of respiration. Cardiovascular system: Irregularly irregular.  No JVD, no murmurs rubs or gallops.  No lower extremity edema.   Gastrointestinal system: Abdomen is soft, nontender, nondistended, positive bowel sounds.  No rebound.  No guarding.   Central nervous system: Alert.  Moving extremities spontaneously.   Extremities: Left foot wrapped in bandage with bluish discoloration of second and third toes.  Skin: No rashes, lesions or ulcers Psychiatry: Judgement and insight appear poor to fair. Mood & affect appropriate.     Data Reviewed: I have personally reviewed following labs and imaging studies  CBC: Recent Labs  Lab 01/24/21 0958 01/06/2021 1406 01/11/2021 2100 01/26/21 0100 01/27/21 0302 01/28/21 0629  WBC 15.8* 15.2*  --  17.9* 17.8* 25.0*  NEUTROABS 12.1* 10.7*  --   --  13.6* 19.3*  HGB 10.5* 11.5*  --  10.7* 9.5* 8.6*  HCT 30.5* 35.3*  --  32.5* 29.3* 27.2*  MCV 91.3 95.4  --  95.3 96.7 101.1*  PLT 98* 73* 76* 72* 66* 95*  96*     Basic Metabolic Panel: Recent Labs  Lab 01/20/2021 1406 01/26/21 0100 01/26/21 0855 01/27/21 0302 01/27/21 1223 01/27/21 1632 01/28/21 0629  NA 146* 146* 147* 148*  --   --  149*  K 3.5 3.3* 3.5 3.8  --   --  3.0*  CL 103 105 107 109  --   --  112*  CO2 29 29 30 26   --   --  29  GLUCOSE 141* 118* 99 118*  --   --  154*  BUN 48* 49* 53* 53*  --   --  41*  CREATININE 1.11* 1.24* 1.35* 1.29*  --   --  1.25*  CALCIUM 10.8* 9.9 9.6 9.0  --   --  8.6*  MG  --   --   --  2.3  --    --  2.5*  PHOS  --   --   --   --  3.7 3.2 2.7     GFR: Estimated Creatinine Clearance: 34.9 mL/min (A) (by C-G formula based on SCr of 1.25 mg/dL (H)).  Liver Function Tests: Recent Labs  Lab 01/24/21 0958 02/01/2021 1406 01/26/21 0855 01/27/21 0302 01/28/21 0629  AST 55* 58* 52* 43* 33  ALT 58* 66* 48* 44 39  ALKPHOS 202* 219* 156* 144* 129*  BILITOT 1.3* 1.5* 0.9 1.0 0.8  PROT 6.5 7.2 5.6* 5.5* 5.6*  ALBUMIN 3.2* 3.8 3.0* 2.8* 2.9*     CBG: Recent Labs  Lab 01/28/21 0434 01/28/21 0817  GLUCAP 126* 167*     Recent Results (from the past 240 hour(s))  SARS CORONAVIRUS 2 (TAT 6-24 HRS) Nasopharyngeal Nasopharyngeal Swab     Status: None   Collection Time: 01/19/21  8:44 PM   Specimen: Nasopharyngeal Swab  Result Value Ref Range Status   SARS Coronavirus 2 NEGATIVE NEGATIVE Final    Comment: (NOTE) SARS-CoV-2 target nucleic acids are NOT DETECTED.  The SARS-CoV-2 RNA is generally detectable in upper and lower respiratory specimens during the acute phase of infection. Negative results do not preclude SARS-CoV-2 infection, do not rule out co-infections with other pathogens, and should not be used as the sole basis for treatment or other patient management decisions. Negative results must be combined with clinical observations, patient history, and epidemiological information. The expected result is Negative.  Fact Sheet for Patients: SugarRoll.be  Fact Sheet for Healthcare Providers: https://www.woods-mathews.com/  This test is not yet approved or cleared by the [place] FDA and  has been authorized for detection and/or diagnosis of SARS-CoV-2 by FDA under an Emergency Use Authorization (EUA). This EUA will remain  in effect (meaning this test can be used) for the duration of the COVID-19 declaration under Se ction 564(b)(1) of the Act, 21 U.S.C. section 360bbb-3(b)(1), unless the authorization is terminated  or revoked sooner.  Performed at [facility], [place] [address] [place], [place] [address]   Resp Panel by RT-PCR (Flu A&B, Covid) Nasopharyngeal Swab     Status: None   Collection Time: 02/05/2021  2:06 PM   Specimen: Nasopharyngeal Swab; Nasopharyngeal(NP) swabs in vial transport medium  Result Value Ref Range Status   SARS Coronavirus 2 by RT PCR NEGATIVE NEGATIVE Final    Comment: (NOTE) SARS-CoV-2 target nucleic acids are NOT DETECTED.  The SARS-CoV-2 RNA is generally detectable in upper respiratory specimens during the acute phase of infection. The lowest concentration of SARS-CoV-2 viral copies this assay can detect is 138 copies/mL. A negative result does not preclude SARS-Cov-2 infection and should not be used as the sole basis for treatment or other patient management decisions. A negative result may occur with  improper specimen collection/handling, submission of specimen other than nasopharyngeal swab, presence of viral mutation(s) within the areas targeted by this assay, and inadequate number of viral copies(<138 copies/mL). A negative result must be combined with clinical observations, patient history, and epidemiological information. The expected result is Negative.  Fact Sheet for Patients:  EntrepreneurPulse.com.au  Fact Sheet for Healthcare Providers:  IncredibleEmployment.be  This test is no t yet approved or cleared by the [place] FDA and  has been authorized for detection and/or diagnosis of SARS-CoV-2 by FDA under an Emergency Use Authorization (EUA). This EUA will remain  in effect (meaning this test can be used) for the duration of the COVID-19 declaration under Section 564(b)(1) of the Act, 21 U.S.C.section 360bbb-3(b)(1), unless the authorization is terminated  or revoked sooner.       Influenza A by PCR NEGATIVE NEGATIVE Final   Influenza B by PCR NEGATIVE NEGATIVE Final    Comment: (NOTE) The Xpert  Xpress SARS-CoV-2/FLU/RSV plus assay is intended as an aid in the diagnosis of influenza from Nasopharyngeal swab specimens and should not be used as a sole basis for treatment. Nasal washings  and aspirates are unacceptable for Xpert Xpress SARS-CoV-2/FLU/RSV testing.  Fact Sheet for Patients: EntrepreneurPulse.com.au  Fact Sheet for Healthcare Providers: IncredibleEmployment.be  This test is not yet approved or cleared by the [place] FDA and has been authorized for detection and/or diagnosis of SARS-CoV-2 by FDA under an Emergency Use Authorization (EUA). This EUA will remain in effect (meaning this test can be used) for the duration of the COVID-19 declaration under Section 564(b)(1) of the Act, 21 U.S.C. section 360bbb-3(b)(1), unless the authorization is terminated or revoked.  Performed at [facility], [place] [address], [place], [place] [address]   MRSA Next Gen by PCR, Nasal     Status: None   Collection Time: 02/01/2021  9:07 PM   Specimen: Nasal Mucosa; Nasal Swab  Result Value Ref Range Status   MRSA by PCR Next Gen NOT DETECTED NOT DETECTED Final    Comment: (NOTE) The GeneXpert MRSA Assay (FDA approved for NASAL specimens only), is one component of a comprehensive MRSA colonization surveillance program. It is not intended to diagnose MRSA infection nor to guide or monitor treatment for MRSA infections. Test performance is not FDA approved in patients less than 70 years old. Performed at [facility], [place] [address], [place], [place] [address]   Culture, blood (Routine X 2) w Reflex to ID Panel     Status: None (Preliminary result)   Collection Time: 01/26/21  8:55 AM   Specimen: BLOOD RIGHT HAND  Result Value Ref Range Status   Specimen Description   Final    BLOOD RIGHT HAND Performed at [place] [address] [place], [place] [address]    Special Requests    Final    BOTTLES DRAWN AEROBIC ONLY Blood Culture results may not be optimal due to an inadequate volume of blood received in culture bottles Performed at [place] [address], [place], [place] [address]    Culture   Final    NO GROWTH 2 DAYS Performed at [place] [address], [place], [place] [address]    Report Status PENDING  Incomplete  Culture, blood (Routine X 2) w Reflex to ID Panel     Status: None (Preliminary result)   Collection Time: 01/26/21  8:55 AM   Specimen: BLOOD LEFT HAND  Result Value Ref Range Status   Specimen Description   Final    BLOOD LEFT HAND Performed at [place] [address], [place], [place] [address]    Special Requests   Final    BOTTLES DRAWN AEROBIC ONLY Blood Culture results may not be optimal due to an inadequate volume of blood received in culture bottles Performed at [place] [address] [place], [place] [address]    Culture   Final    NO GROWTH 2 DAYS Performed at [place] [address], [place],  [address]    Report Status PENDING  Incomplete          Radiology Studies: DG CHEST PORT 1 VIEW  Result Date: 01/28/2021 CLINICAL DATA:  Follow-up CHF, lung cancer, dyspnea EXAM: PORTABLE CHEST 1 VIEW COMPARISON:  01/26/2021 chest radiograph. FINDINGS: Stable cardiomediastinal silhouette with moderate cardiomegaly. No pneumothorax. No pleural effusion. Suture lines overlie the right lung apex. Patchy opacities in both lungs, most prominent in the upper left and lower right lung, mildly worsened. IMPRESSION: Moderate cardiomegaly with mildly worsened bilateral patchy lung opacities, most prominent in  the upper left and right lower lung, favor pulmonary edema superimposed on known upper left lung cancer. Electronically Signed   By: Ilona Sorrel M.D.   On: 01/28/2021 08:13   ECHOCARDIOGRAM COMPLETE  Result Date: 01/26/2021     ECHOCARDIOGRAM REPORT   Patient Name:   Heather [name] Date of Exam: 01/26/2021 Medical Rec #:  [id number]       Height:       67.0 in Accession #:    [id number]      Weight:       154.3 lb Date of Birth:  [birth year]       BSA:          1.811 m Patient Age:    79 years        BP:           150/75 mmHg Patient Gender: F               HR:           103 bpm. Exam Location:  Inpatient Procedure: 2D Echo, Cardiac Doppler and Color Doppler Indications:    CHF-Acute Systolic W10.27                 Atrial Fibrillation I48.91  History:        Patient has prior history of Echocardiogram examinations, most                 recent 05/07/2019. CHF; Risk Factors:Hypertension and Current                 Smoker.  Sonographer:    Bernadene Person RDCS Referring Phys: [id number] JARED M GARDNER  Sonographer Comments: Image acquisition challenging due to respiratory motion. Pt sitting up during entire echo. IMPRESSIONS  1. Left ventricular ejection fraction, by estimation, is 20 to 25%. The left ventricle has severely decreased function. The left ventricle demonstrates global hypokinesis. There is mild concentric left ventricular hypertrophy. Left ventricular diastolic  parameters are indeterminate.  2. Right ventricular systolic function was not well visualized. The right ventricular size is normal. There is normal pulmonary artery systolic pressure. The estimated right ventricular systolic pressure is 64.4 mmHg.  3. Left atrial size was severely dilated.  4. Right atrial size was mildly dilated.  5. A small pericardial effusion is present.  6. The mitral valve is grossly normal. Trivial mitral valve regurgitation. No evidence of mitral stenosis.  7. The aortic valve is tricuspid. Aortic valve regurgitation is trivial. No aortic stenosis is present.  8. The inferior vena cava is normal in size with <50% respiratory variability, suggesting right atrial pressure of 8 mmHg. Comparison(s): A prior study was performed on 05/07/2019. Prior images  reviewed side by side. LV function has declined; prior study done in sinus rhythm. FINDINGS  Left Ventricle: Left ventricular ejection fraction, by estimation, is 20 to 25%. The left ventricle has severely decreased function. The left ventricle demonstrates global hypokinesis. The left ventricular internal cavity size was normal in size. There is mild concentric left ventricular hypertrophy. Left ventricular diastolic parameters are indeterminate. Right Ventricle: The right ventricular size is normal. No increase in right ventricular wall thickness. Right ventricular systolic function was not well visualized. There is normal pulmonary artery systolic pressure. The tricuspid regurgitant velocity is  2.01 m/s, and with an assumed right atrial pressure of 8 mmHg, the estimated right ventricular systolic pressure is 03.4 mmHg. Left Atrium: Left atrial size was severely dilated. Right Atrium: Right atrial size was  mildly dilated. Pericardium: A small pericardial effusion is present. Mitral Valve: The mitral valve is grossly normal. Trivial mitral valve regurgitation. No evidence of mitral valve stenosis. Tricuspid Valve: The tricuspid valve is normal in structure. Tricuspid valve regurgitation is mild. Aortic Valve: The aortic valve is tricuspid. Aortic valve regurgitation is trivial. No aortic stenosis is present. Pulmonic Valve: The pulmonic valve was normal in structure. Pulmonic valve regurgitation is mild to moderate. No evidence of pulmonic stenosis. Aorta: The aortic root and ascending aorta are structurally normal, with no evidence of dilitation for age and body surface area. Venous: The inferior vena cava is normal in size with less than 50% respiratory variability, suggesting right atrial pressure of 8 mmHg. IAS/Shunts: The atrial septum is grossly normal.  LEFT VENTRICLE PLAX 2D LVIDd:         5.60 cm LVIDs:         5.00 cm LV PW:         1.10 cm LV IVS:        1.10 cm LVOT diam:     2.10 cm LV SV:         31  LV SV Index:   17 LVOT Area:     3.46 cm  LV Volumes (MOD) LV vol d, MOD A4C: 109.0 ml LV vol s, MOD A4C: 89.7 ml LV SV MOD A4C:     109.0 ml RIGHT VENTRICLE RV S prime:     9.49 cm/s TAPSE (M-mode): 1.7 cm LEFT ATRIUM             Index       RIGHT ATRIUM           Index LA diam:        4.70 cm 2.59 cm/m  RA Area:     20.40 cm LA Vol (A2C):   56.7 ml 31.30 ml/m RA Volume:   47.00 ml  25.95 ml/m LA Vol (A4C):   87.4 ml 48.25 ml/m LA Biplane Vol: 73.1 ml 40.36 ml/m  AORTIC VALVE LVOT Vmax:   64.43 cm/s LVOT Vmean:  43.933 cm/s LVOT VTI:    0.088 m  AORTA Ao Root diam: 3.20 cm Ao Asc diam:  3.80 cm MITRAL VALVE               TRICUSPID VALVE MV Area (PHT): 2.17 cm    TR Peak grad:   16.2 mmHg MV Decel Time: 349 msec    TR Vmax:        201.00 cm/s MV E velocity: 42.10 cm/s MV A velocity: 37.70 cm/s  SHUNTS MV E/A ratio:  1.12        Systemic VTI:  0.09 m                            Systemic Diam: 2.10 cm Rudean Haskell MD Electronically signed by Rudean Haskell MD Signature Date/Time: 01/26/2021/2:35:43 PM    Final         Scheduled Meds:  arformoterol  15 mcg Nebulization BID   atorvastatin  40 mg Oral QHS   budesonide (PULMICORT) nebulizer solution  0.5 mg Nebulization BID   Chlorhexidine Gluconate Cloth  6 each Topical Daily   cyanocobalamin  1,000 mcg Subcutaneous Daily   [START ON 2021/02/13] digoxin  0.125 mg Intravenous Daily   ipratropium  0.5 mg Nebulization Q6H   levalbuterol  0.63 mg Nebulization Q6H   mouth rinse  15 mL Mouth Rinse BID  methylPREDNISolone (SOLU-MEDROL) injection  40 mg Intravenous Q12H   metoprolol tartrate  12.5 mg Oral BID   pantoprazole (PROTONIX) IV  40 mg Intravenous Q24H   Continuous Infusions:  azithromycin Stopped (01/27/21 2349)   cefTRIAXone (ROCEPHIN)  IV Stopped (01/27/21 2100)   dextrose 100 mL/hr at 01/28/21 0921   heparin 900 Units/hr (01/28/21 0922)   potassium chloride 10 mEq (01/28/21 0935)     LOS: 3 days    Time spent: 40  mins    Irine Seal, MD [facility]   To contact the attending provider between 7A-7P or the covering provider during after hours 7P-7A, please log into the web site [email or web address] and access using universal [place] password for that web site. If you do not have the password, please call the hospital operator.  01/28/2021, 10:06 AM

## 2021-01-28 NOTE — Progress Notes (Signed)
Daily Progress Note   Patient Name: Heather [name]       Date: 01/28/2021 DOB: [birth year]  Age: 80 y.o. MRN#: [id number] Attending Physician: Eugenie Filler, MD Primary Care Physician: Rosita Fire, MD Admit Date: 01/14/2021  Reason for Consultation/Follow-up: Establishing goals of care  Subjective: I saw the patient earlier this morning.  She was resting in bed.  When I told her good morning, she was able to mumble good morning back to me.  Otherwise, patient mumbles and is not awake alert enough to have detailed conversation.  She remains on high oxygen requirements.  Chart reviewed.  Patient seen and examined.  Call placed and discussed with daughter Heather [name].  Subsequently family meeting also held, see below.  Length of Stay: 3  Current Medications: Scheduled Meds:  . arformoterol  15 mcg Nebulization BID  . atorvastatin  40 mg Oral QHS  . budesonide (PULMICORT) nebulizer solution  0.5 mg Nebulization BID  . Chlorhexidine Gluconate Cloth  6 each Topical Daily  . cyanocobalamin  1,000 mcg Subcutaneous Daily  . [START ON 02-03-2021] digoxin  0.125 mg Intravenous Daily  . ipratropium  0.5 mg Nebulization Q6H  . levalbuterol  0.63 mg Nebulization Q6H  . mouth rinse  15 mL Mouth Rinse BID  . methylPREDNISolone (SOLU-MEDROL) injection  40 mg Intravenous Q12H  . metoprolol tartrate  12.5 mg Oral BID  . pantoprazole (PROTONIX) IV  40 mg Intravenous Q24H    Continuous Infusions: . azithromycin Stopped (01/27/21 2349)  . cefTRIAXone (ROCEPHIN)  IV Stopped (01/27/21 2100)  . dextrose 100 mL/hr at 01/28/21 1319  . heparin 900 Units/hr (01/28/21 1319)    PRN Meds: acetaminophen, HYDROcodone-acetaminophen, LORazepam, prochlorperazine  Physical Exam         Patient is on heated  high flow nasal cannula, she has coarse diffuse breath sounds Restless at times Is able to respond with 1 or 2 words appropriately otherwise does not verbalize in detail. L foot bandaged  Vital Signs: BP (!) 130/105   Pulse 98   Temp 97.6 F (36.4 C) (Axillary)   Resp (!) 34   Ht 5\' 7"  (1.702 m)   Wt 68 kg   SpO2 96%   BMI 23.48 kg/m  SpO2: SpO2: 96 % O2 Device: O2 Device: High Flow Nasal Cannula O2 Flow Rate: O2  Flow Rate (L/min): 10 L/min  Intake/output summary:  Intake/Output Summary (Last 24 hours) at 01/28/2021 1423 Last data filed at 01/28/2021 1319 Gross per 24 hour  Intake 2413.23 ml  Output 725 ml  Net 1688.23 ml   LBM: Last BM Date:  (pta) Baseline Weight: Weight: 70 kg Most recent weight: Weight: 68 kg       Palliative Assessment/Data:      Patient Active Problem List   Diagnosis Date Noted  . DIC (disseminated intravascular coagulation) ([place]) 01/26/2021  . Malnutrition of moderate degree 01/26/2021  . Acute on chronic congestive heart failure ([place])   . Hypokalemia   . Acute on chronic respiratory failure with hypoxia ([place]) 02/02/2021  . Atrial fibrillation with RVR ([place]) 01/31/2021  . Left leg DVT ([place]) 01/18/2021  . Decubitus ulcer of sacral region, stage 1 01/08/2021  . Acute on chronic systolic CHF (congestive heart failure) ([place]) 01/22/2021  . CAP (community acquired pneumonia) 01/27/2021  . Atherosclerosis of native artery of left lower extremity with gangrene ([place]) 01/11/2021  . Adenocarcinoma of left lung, stage 4 ([place]) 12/15/2020  . Prolapsed internal hemorrhoids, grade 3 03/25/2020  . Grade III hemorrhoids 11/06/2019  . Primary adenocarcinoma of upper lobe of left lung ([place]) 08/07/2017  . Malignant neoplasm of upper lobe of left lung ([place]) 08/01/2017  . History of adenomatous polyp of colon 02/06/2017  . Ascending aortic aneurysm ([place]) 07/20/2014  . Non-small cell carcinoma of right lung, stage 1 ([place]) 07/20/2014  . Constipation 01/07/2012   . Encounter for screening colonoscopy 01/07/2012  . Tobacco abuse   . Lung nodule 08/21/2011    Palliative Care Assessment & Plan   Patient Profile:  80 y.o. female who was living independently at home alone.  She has a past medical history of non small cell lung cancer s/p RU lobectomy in 2013 with recurrence in 2019, CHF, afib/flutter, constipation, tobacco use, PVD s/p stenting, she underwent amputation of left 4th and 5th toe last week.  She was admitted on 02/04/2021 with shortness of breath.  She was placed at [facility] SNF 3 days.   The patient was placed on BiPAP but did not tolerate it.  DDX for SOB is likely multifactorial.  She was found to have a DVT but no PE.  She is being treated for community acquired pneumonia.  Imaging shows a significant increase in [facility] tumor in her LUL from 1.6 x 1.4 to 4.1 x 4.3, BNP was slightly elevated. Echocardiogram shows a decrease in her LVEF from 30-35% to 20-25%. Oncology started immunotherapy with Tagrisso on 6/22 and there is concern for possible pulmonary toxicity.  In addition the patient has probable DIC thought to be due to [facility].   She is quite encephalopathic, barely taking POs and refused placement of a cortrak tube.  Assessment: Acute on chronic hypoxic respiratory failure, deemed multifactorial in etiology because of patient's worsening non-small cell lung cancer stage IV, possible community-acquired pneumonia, component of acute congestive heart failure secondary to atrial fibrillation with rapid ventricular response, concern for possible pulmonary arterial hypertension as well as Tagrisso related pulmonary toxicity. Probable DIC DVT Poor oral intake, ongoing functional decline  Recommendations/Plan: Family meeting: Patient seen and examined, discussed with daughter Heather [name] on the phone subsequently at 1300 on 01-28-2021 patient's daughter Heather [name] and her 2 sons 1 of whom arrived from out of town arrived at the hospital.  They visited with the  patient and then a family meeting was held outside of the patient's room.  We  reviewed again about why palliative care was following Heather [name].  We discussed again about patient's underlying serious illness as well as areas of concern pertaining to this current hospitalization.  Broad goals of care discussions were undertaken.  Goals wishes and values important to the patient and family as a unit attempted to be explored. Patient was functioning independently up until 3 weeks ago.  We recapped her hospital course thus far as well as her overall health in the last 3-4 weeks. Plan: Continue current mode of care as a time-limited trial for the next 2 to 3 days.  Early to mid next week, repeat discussion regarding goals of care is to be undertaken.  At that time we will evaluate whether or not the patient has a had any reasonable benefit from current treatments.  If the patient is noted to have no significant changes or no significant advancements toward stabilization/recovery then, at that time, we will discuss with patient's family in detail about full scope of comfort measures and residential hospice.  Today briefly we talked about hospice philosophy of care and what her care will look like inside a residential hospice setting.  Alternatively, we reviewed about what her care will look like in a skilled nursing facility for a rehab trial.  Patient's children are not in favor of the patient going to get another skilled nursing facility and if the patient does not have significant improvement, they would be willing to consider more of a comfort-focused pathway of care that could include a potential transfer to residential hospice at that time.  For now, continue current mode of care and palliative medicine to follow.  All of the patient's children's questions addressed to the best of my ability.    Code Status:    Code Status Orders  (From admission, onward)           Start     Ordered   01/27/21 1648   Do not attempt resuscitation (DNR)  Continuous       Question Answer Comment  In the event of cardiac or respiratory ARREST Do not call a "code blue"   In the event of cardiac or respiratory ARREST Do not perform Intubation, CPR, defibrillation or ACLS   In the event of cardiac or respiratory ARREST Use medication by any route, position, wound care, and other measures to relive pain and suffering. May use oxygen, suction and manual treatment of airway obstruction as needed for comfort.      01/27/21 1647           Code Status History     Date Active Date Inactive Code Status Order ID Comments User Context   01/30/2021 2027 01/27/2021 1647 Full Code [id number]  Etta Quill, DO ED   01/11/2021 1106 01/21/2021 0042 Full Code [id number]  Cherre Robins, MD ED       Prognosis:  Unable to determine  Discharge Planning: To Be Determined  Care plan was discussed with bedside RN, interdisciplinary team, patient's daughter Heather [name] on the phone.  Subsequently family meeting face-to-face also held with the patient's daughter Heather [name] and her 2 sons as well.  Thank you for allowing the Palliative Medicine Team to assist in the care of this patient.   Time In: 1300 Time Out: 1345 Total Time 45 Prolonged Time Billed  no       Greater than 50%  of this time was spent counseling and coordinating care related to the above assessment and plan.  Loistine Chance,  MD  Please contact Palliative Medicine Team phone at [phone] for questions and concerns.

## 2021-01-28 NOTE — Progress Notes (Signed)
[place] for IV heparin Indication: New Afib/RVR, + DVT  Allergies  Allergen Reactions   Ibuprofen Nausea And Vomiting and Other (See Comments)    "Allergic," per [facility]   Powder Rash and Other (See Comments)    NO gloves with powder- "Allergic," per [facility]    Patient Measurements: Height: 5\' 7"  (170.2 cm) Weight: 68 kg (149 lb 14.6 oz) IBW/kg (Calculated) : 61.6 Heparin Dosing Weight: TBW (used [place] nomogram for initial dosing)  Vital Signs: Temp: 97.9 F (36.6 C) (07/23 1741) Temp Source: Axillary (07/23 1741) BP: 124/85 (07/23 1600) Pulse Rate: 110 (07/23 1600)  Labs: Recent Labs    01/15/2021 2100 01/20/2021 2100 01/26/21 0100 01/26/21 0855 01/27/21 0302 01/28/21 0629 01/28/21 1713  HGB  --    < > 10.7*  --  9.5* 8.6*  --   HCT  --   --  32.5*  --  29.3* 27.2*  --   PLT 76*  --  72*  --  66* 95*  96*  --   APTT >200*  --   --  172*  --  176*  --   LABPROT 32.6*  --   --  22.7*  --  19.6*  --   INR 3.2*  --   --  2.0*  --  1.7*  --   HEPARINUNFRC  --    < > 0.42 0.49 0.60 0.80* 0.58  CREATININE  --    < > 1.24* 1.35* 1.29* 1.25*  --    < > = values in this interval not displayed.     Estimated Creatinine Clearance: 34.9 mL/min (A) (by C-G formula based on SCr of 1.25 mg/dL (H)).   Medical History: Past Medical History:  Diagnosis Date   Ascending aortic aneurysm ([place]) 07/20/2014   4.1 cm by CT   Atrial fib/flutter, transient    post-op   CHF (congestive heart failure) ([place])    a. EF 30-35% by echo in 2013 --> similar results by repeat imaging in 2016 and 11/2017.   Constipation    corrects with diet   F[place]ater, vitreous    Right   Hypertension    Nonsquamous nonsmall cell neoplasm of lung ([place]) 09/2011   Stage IB- s/p right upper lobectomy   Pneumonia    Psoriasis    Ankles   Tobacco abuse    Scheduled:   arformoterol  15 mcg Nebulization BID   atorvastatin  40 mg Oral QHS   budesonide (PULMICORT)  nebulizer solution  0.5 mg Nebulization BID   Chlorhexidine Gluconate Cloth  6 each Topical Daily   cyanocobalamin  1,000 mcg Subcutaneous Daily   [START ON 2021-02-02] digoxin  0.125 mg Intravenous Daily   ipratropium  0.5 mg Nebulization Q6H   levalbuterol  0.63 mg Nebulization Q6H   mouth rinse  15 mL Mouth Rinse BID   methylPREDNISolone (SOLU-MEDROL) injection  40 mg Intravenous Q12H   metoprolol tartrate  12.5 mg Oral BID   pantoprazole (PROTONIX) IV  40 mg Intravenous Q24H    Assessment: 30 yoF with PMH HTN, transient Afib/flutter, AAA, lung cancer on oral chemo, who was recently admitted 7/5 - 7/15 for amputation of ischemic L 5th toe and subsequently sent to rehab, now presenting with SOB and found to be in [place].  01/19/2021 - Also concern for DIC as patient with elevated PTT > 200, fibrinogen of < 60, D-dimer of 17.35, smear with schistocytes noted.  BLE doppler positive for L peroneal  DVT;  CTA : negative PE.   Baseline INR, aPTT: not done, aPTTs resulted after Heparin started Prior anticoagulation: none, does take ASA + Plavix PTA Baseline INR elevated 3.2  Today, 01/28/2021: 0629 Heparin level = 0.58 (Therapeutic) with heparin infusing at 900 units/hr CBC: Hgb down to 8.6, Plt up 96 SCr elevated at 1.25 Noted hematoma RUE where IV site pulled 7/20, no further bleed S/P 7/13 L toe amputations, 4th & 5th: RN noted some oozing No bleeding or line interruptions reported from nurse   Goal of Therapy: Heparin level 0.3-0.7 units/ml Monitor platelets by anticoagulation protocol: Yes  Plan: Continue  heparin to 900 units/hr IV infusion Daily CBC, daily heparin level Monitor for signs of bleeding or thrombosis   Royetta Asal, PharmD, BCPS 01/28/2021 6:29 PM

## 2021-01-28 NOTE — Progress Notes (Signed)
[place] for IV heparin Indication: New Afib/RVR, + DVT  Allergies  Allergen Reactions   Ibuprofen Nausea And Vomiting and Other (See Comments)    "Allergic," per [facility]   Powder Rash and Other (See Comments)    NO gloves with powder- "Allergic," per [facility]    Patient Measurements: Height: 5\' 7"  (170.2 cm) Weight: 68 kg (149 lb 14.6 oz) IBW/kg (Calculated) : 61.6 Heparin Dosing Weight: TBW (used [place] nomogram for initial dosing)  Vital Signs: Temp: 96.9 F (36.1 C) (07/23 0500) Temp Source: Axillary (07/23 0500) BP: 139/90 (07/23 0800) Pulse Rate: 90 (07/23 0400)  Labs: Recent Labs    02/03/2021 1406 01/13/2021 1820 01/26/2021 2100 01/30/2021 2100 01/26/21 0100 01/26/21 0855 01/27/21 0302 01/28/21 0629  HGB 11.5*  --   --   --  10.7*  --  9.5* 8.6*  HCT 35.3*  --   --   --  32.5*  --  29.3* 27.2*  PLT 73*  --  76*  --  72*  --  66* 95*  96*  APTT  --   --  >200*  --   --  172*  --  176*  LABPROT  --   --  32.6*  --   --  22.7*  --  19.6*  INR  --   --  3.2*  --   --  2.0*  --  1.7*  HEPARINUNFRC  --   --   --    < > 0.42 0.49 0.60 0.80*  CREATININE 1.11*  --   --   --  1.24* 1.35* 1.29* 1.25*  TROPONINIHS 177* 165*  --   --   --   --   --   --    < > = values in this interval not displayed.     Estimated Creatinine Clearance: 34.9 mL/min (A) (by C-G formula based on SCr of 1.25 mg/dL (H)).   Medical History: Past Medical History:  Diagnosis Date   Ascending aortic aneurysm ([place]) 07/20/2014   4.1 cm by CT   Atrial fib/flutter, transient    post-op   CHF (congestive heart failure) ([place])    a. EF 30-35% by echo in 2013 --> similar results by repeat imaging in 2016 and 11/2017.   Constipation    corrects with diet   Floater, vitreous    Right   Hypertension    Nonsquamous nonsmall cell neoplasm of lung ([place]) 09/2011   Stage IB- s/p right upper lobectomy   Pneumonia    Psoriasis    Ankles   Tobacco abuse    Scheduled:    arformoterol  15 mcg Nebulization BID   atorvastatin  40 mg Oral QHS   budesonide (PULMICORT) nebulizer solution  0.5 mg Nebulization BID   Chlorhexidine Gluconate Cloth  6 each Topical Daily   cyanocobalamin  1,000 mcg Subcutaneous Daily   [START ON 02-01-2021] digoxin  0.125 mg Intravenous Daily   feeding supplement (PROSource TF)  45 mL Per Tube BID   ipratropium  0.5 mg Nebulization Q6H   levalbuterol  0.63 mg Nebulization Q6H   mouth rinse  15 mL Mouth Rinse BID   methylPREDNISolone (SOLU-MEDROL) injection  40 mg Intravenous Q12H   metoprolol tartrate  12.5 mg Oral BID   pantoprazole (PROTONIX) IV  40 mg Intravenous Q24H    Assessment: 18 yoF with PMH HTN, transient Afib/flutter, AAA, lung cancer on oral chemo, who was recently admitted 7/5 - 7/15 for amputation  of ischemic L 5th toe and subsequently sent to rehab, now presenting with SOB and found to be in [place].  01/16/2021 - Also concern for DIC as patient with elevated PTT > 200, fibrinogen of < 60, D-dimer of 17.35, smear with schistocytes noted.  BLE doppler positive for L peroneal DVT;  CTA : negative PE.   Baseline INR, aPTT: not done, aPTTs resulted after Heparin started Prior anticoagulation: none, does take ASA + Plavix PTA Baseline INR elevated 3.2  Today, 01/28/2021: 0629 Heparin level = 0.8 (supra-therapeutic) with heparin infusing at 1000 units/hr CBC: Hgb down to 8.6, Plt up 96 SCr elevated at 1.25 Noted hematoma RUE where IV site pulled 7/20, no further bleed S/P 7/13 L toe amputations, 4th & 5th: RN noted some oozing No bleeding or line interruptions reported from nurse   Goal of Therapy: Heparin level 0.3-0.7 units/ml Monitor platelets by anticoagulation protocol: Yes  Plan: Decrease heparin to 900 units/hr IV infusion Obtain 8 hr heparin level Daily CBC, daily heparin level Monitor for signs of bleeding or thrombosis  Rawleigh Rode P. Legrand Como, PharmD, [place] Please  utilize Amion for appropriate phone number to reach the unit pharmacist ([place]) 01/28/2021 8:25 AM

## 2021-01-28 NOTE — Progress Notes (Signed)
NAME:  Heather [name], MRN:  [id number], DOB:  [birth year], LOS: 3 ADMISSION DATE:  01/27/2021, CONSULTATION DATE:  7/21 REFERRING MD:  Dr. Grandville Silos, CHIEF COMPLAINT:  Shortness of Breath  History of Present Illness:  80 y/o F who presented to [facility] on 7/20 with reports of shortness of breath.   The patient has been most recently living at [facility] after an admission to [facility] from 7/5-7/15 for PAD with LLE ischemic necrosis of the her last two toes.  The patient underwent angiogram for critical limb ischemia on 7/7 and subsequent amputation on 7/13 of her last two toes on the left foot.  She was transferred to SNF on 7/15 for rehab efforts.  It is noted that during that admission she was documented as profoundly deconditioned.   EMS was called at her SNF due to reports of shortness of breath that began 7/20 am.  Her oxygen was increased to 6L and subsequently decreased but the facility felt she should be evaluated and requested EMS transport.  On arrival, she was noted to be altered but in no distress.  EKG showed AFwRVR.  VBG 7.42 / CO2 48.  COVID / influenza testing were negative.  CXR showed cardiomegaly, bilateral pleural effusions, pulmonary edema.  She had LLE swelling and an ultrasound was assessed which was positive for LE DVT.  She had a mild troponin leak thought related to respiratory distress.  Heparin gtt was initiated. CTA of the chest was evaluated to rule out PE and was negative. However, imaging did show an increase in the size of the L apical spiculated pulmonary mass consistent with lung cancer, increase in diffuse patchy ground glass opacities, trace effusions, enlarged pulmonary artery suggestive of PH, cardiomegaly and known abdominal aortic aneurysm (poorly visualized). EKG showed atrial flutter with PVC's.  She was admitted per [facility] for further evaluation.  She was placed on BiPAP for work of breathing but became nauseated and it was removed.  Additional lab work notable for  possible DIC.  PCCM consulted for pulmonary evaluation.    Pertinent  Medical History  Aflutter  CHF  Ascending Aortic Aneurysm PVD - left toe amputations 2022 HTN  COPD - on 2L O2 at baseline  Non-Squamous Non-Small Cell - stage 1B s/p RUL lobectomy 2013, recurrence of disease in LUL s/p stereotatic therapy in 2019, RLL recurrence in 11/2020 -now stage 4 Former Tobacco Abuse   Significant Hospital Events: Including procedures, antibiotic start and stop dates in addition to other pertinent events   7/20 Admit 7/21 PCCM consulted for pulmonary evaluation, assessment of DIC, DVT, and progressive changes to pulmonary nodule in patient with history of adenocarcinoma Encephalopathic minimal interactions  Interim History / Subjective:  Remains on oxygen supplementation Arousable but minimally interactive  Objective   Blood pressure 125/77, pulse 93, temperature 97.9 F (36.6 C), temperature source Axillary, resp. rate (!) 28, height 5\' 7"  (1.702 m), weight 68 kg, SpO2 100 %.        Intake/Output Summary (Last 24 hours) at 01/28/2021 0949 Last data filed at 01/28/2021 0500 Gross per 24 hour  Intake 1487.53 ml  Output 725 ml  Net 762.53 ml   Filed Weights   01/06/2021 1604 01/27/21 0814 01/28/21 0500  Weight: 70 kg 68.3 kg 68 kg    Examination: General: Elderly, very frail HEENT: Moist oral mucosa, on oxygen supplementation Neuro: Arousable, not following commands  CV: S1-S2 appreciated PULM: Rhonchi at the bases GI: Soft, bowel sounds appreciated Extremities: warm/dry, trace LE  edema, left foot wrapped, dusky 2nd/3rd toe with cap refill >4 seconds/sluggish , L 4th and 5th toes surgically removed.  Bruise right upper arm Skin: no rashes or lesions  Resolved Hospital Problem list     Assessment & Plan:   Acute on chronic hypoxic respiratory failure Recurrent adenocarcinoma of the lung Chronic obstructive pulmonary disease -On therapy for possible community-acquired  pneumonia -Pulmonary hygiene -On Solu-Medrol  Recurrent adenocarcinoma of the lung Previously treated with SBRT Post right upper lobectomy in 2013 -Was on home Tagrisso -Tagrisso on hold -Tagrisso may contribute to interstitial lung disease  Atrial fibs/flutter -On heparin -On digoxin -Maintain normal electrolytes  Heart failure with ejection fraction of 20 to 25% Global hypokinesis -Monitor input output -Cautious diuresis  Acute DVT left lower extremity -Continue anticoagulation  DIC -Likely related to underlying malignancy -Continue supportive measures -Complete course of antibiotics for pneumonia -Trend CBC and platelets -Thrombocytopenia use stabilizing  Urinary retention -Maintain Foley catheter  Nutrition -Entirely as tolerated -Patient refuses Cortrak  Hypernatremia -Patient not taking much orally -Will give small boluses of D5  Best Practice (right click and "Reselect all SmartList Selections" daily)   Diet/type: NPO, may take orally as tolerated DVT prophylaxis: systemic heparin GI prophylaxis: PPI Lines: N/A Foley:  N/A Code Status:  DNR Last date of multidisciplinary goals of care discussion:  Reviewed patients medical history with daughter, current respiratory burden, DIC, dusky toes and AF/flutter.  We reviewed that most likely, her current issues are cumulative from recent illnesses with prolonged immobilization, poor intake, surgery and recurrent lung cancer.  She indicates the patient has previously expressed that she would not want to be on mechanical ventilation.  Her daughter is hopeful she will recover but understands that she may be in a difficult place where she will not be able to recover & this may mean end of life.  We discussed the concept of proceeding forward with all interventions that do not come with great discomfort for Heather [name].  We also discussed no CPR / no ventilation and Heather [name] notes that she will need to speak to her family  (patient has 3 brothers and 2 other sons).    Ongoing discussions regarding goals of care .  Appreciate palliative care involvement  Labs   CBC: Recent Labs  Lab 01/24/21 0958 01/31/2021 1406 01/21/2021 2100 01/26/21 0100 01/27/21 0302 01/28/21 0629  WBC 15.8* 15.2*  --  17.9* 17.8* 25.0*  NEUTROABS 12.1* 10.7*  --   --  13.6* 19.3*  HGB 10.5* 11.5*  --  10.7* 9.5* 8.6*  HCT 30.5* 35.3*  --  32.5* 29.3* 27.2*  MCV 91.3 95.4  --  95.3 96.7 101.1*  PLT 98* 73* 76* 72* 66* 95*  96*    Basic Metabolic Panel: Recent Labs  Lab 01/27/2021 1406 01/26/21 0100 01/26/21 0855 01/27/21 0302 01/27/21 1223 01/27/21 1632 01/28/21 0629  NA 146* 146* 147* 148*  --   --  149*  K 3.5 3.3* 3.5 3.8  --   --  3.0*  CL 103 105 107 109  --   --  112*  CO2 29 29 30 26   --   --  29  GLUCOSE 141* 118* 99 118*  --   --  154*  BUN 48* 49* 53* 53*  --   --  41*  CREATININE 1.11* 1.24* 1.35* 1.29*  --   --  1.25*  CALCIUM 10.8* 9.9 9.6 9.0  --   --  8.6*  MG  --   --   --  2.3  --   --  2.5*  PHOS  --   --   --   --  3.7 3.2 2.7   GFR: Estimated Creatinine Clearance: 34.9 mL/min (A) (by C-G formula based on SCr of 1.25 mg/dL (H)). Recent Labs  Lab 01/22/2021 1406 01/22/2021 2056 01/16/2021 2100 01/26/21 0100 01/27/21 0302 01/28/21 0629  PROCALCITON  --   --  <0.10  --   --   --   WBC 15.2*  --   --  17.9* 17.8* 25.0*  LATICACIDVEN  --  2.1*  --  2.7*  --  2.1*    Liver Function Tests: Recent Labs  Lab 01/24/21 0958 01/08/2021 1406 01/26/21 0855 01/27/21 0302 01/28/21 0629  AST 55* 58* 52* 43* 33  ALT 58* 66* 48* 44 39  ALKPHOS 202* 219* 156* 144* 129*  BILITOT 1.3* 1.5* 0.9 1.0 0.8  PROT 6.5 7.2 5.6* 5.5* 5.6*  ALBUMIN 3.2* 3.8 3.0* 2.8* 2.9*   No results for input(s): LIPASE, AMYLASE in the last 168 hours. No results for input(s): AMMONIA in the last 168 hours.  ABG    Component Value Date/Time   PHART 7.438 (H) 09/25/2011 0423   PCO2ART 42.3 09/25/2011 0423   PO2ART 72.4 (L)  09/25/2011 0423   HCO3 30.6 (H) 01/24/2021 1406   TCO2 29.4 09/25/2011 0423   O2SAT 17.0 01/21/2021 1406     Coagulation Profile: Recent Labs  Lab 01/12/2021 2100 01/26/21 0855 01/28/21 0629  INR 3.2* 2.0* 1.7*    Cardiac Enzymes: No results for input(s): CKTOTAL, CKMB, CKMBINDEX, TROPONINI in the last 168 hours.  HbA1C: No results found for: HGBA1C  CBG: Recent Labs  Lab 01/28/21 0434 01/28/21 0817  GLUCAP 126* 167*    Social History:   reports that she has been smoking cigarettes. She has a 6.00 pack-year smoking history. She has never used smokeless tobacco. She reports current alcohol use. She reports that she does not use drugs.   Family History:  Her family history includes Heart disease in her brother, mother, and sister; Hypertension in her mother. There is no history of Colon cancer.   Allergies Allergies  Allergen Reactions   Ibuprofen Nausea And Vomiting and Other (See Comments)    "Allergic," per MAR   Powder Rash and Other (See Comments)    NO gloves with powder- "Allergic," per [facility]     Home Medications  Prior to Admission medications   Medication Sig Start Date End Date Taking? Authorizing Provider  acetaminophen (TYLENOL) 500 MG tablet Take 500 mg by mouth every 6 (six) hours as needed for mild pain.   Yes [provider]  alendronate (FOSAMAX) 70 MG tablet Take 70 mg by mouth every Monday.  07/06/14  Yes [provider]  aspirin EC 81 MG EC tablet Take 1 tablet (81 mg total) by mouth daily. Swallow whole. 01/15/21  Yes Setzer, Edman Circle, PA-C  atorvastatin (LIPITOR) 40 MG tablet Take 1 tablet (40 mg total) by mouth daily. Patient taking differently: Take 40 mg by mouth at bedtime. 01/15/21  Yes Setzer, Edman Circle, PA-C  clopidogrel (PLAVIX) 75 MG tablet Take 1 tablet (75 mg total) by mouth daily. 01/20/21  Yes Rhyne, Hulen Shouts, PA-C  glucosamine-chondroitin 500-400 MG tablet Take 1 tablet by mouth daily.   Yes [provider]   hydrochlorothiazide (HYDRODIURIL) 12.5 MG tablet Take 6.25 mg by mouth daily.   Yes [provider]  HYDROcodone-acetaminophen (NORCO/VICODIN) 5-325 MG tablet Take 1 tablet by mouth  every 6 (six) hours as needed. Patient taking differently: Take 1 tablet by mouth every 6 (six) hours as needed (for pain). 01/20/21  Yes Rhyne, Hulen Shouts, PA-C  meclizine (ANTIVERT) 12.5 MG tablet Take 12.5 mg by mouth daily as needed for dizziness.   Yes [provider]  metoprolol succinate (TOPROL-XL) 50 MG 24 hr tablet Take 25 mg by mouth in the morning. Take with or immediately following a meal.   Yes [provider]  Multiple Vitamins-Iron (MULTIVITAMINS WITH IRON) TABS Take 1 tablet by mouth daily.   Yes [provider]  Omega-3 Fatty Acids (FISH OIL) 500 MG CAPS Take 500 mg by mouth daily.    Yes [provider]  osimertinib mesylate (TAGRISSO) 80 MG tablet Take 1 tablet (80 mg total) by mouth daily. 12/15/20  Yes Curt Bears, MD  OXYGEN Inhale 2 L/min into the lungs See admin instructions. "2 L/min every day and night shift to keep O2 sats ABOVE 90%"   Yes [provider]  OYSTER SHELL CALCIUM + D 500-200 MG-UNIT TABS Take 1 tablet by mouth 3 (three) times daily.   Yes [provider]  pantoprazole (PROTONIX) 20 MG tablet Take 20 mg by mouth daily before breakfast.   Yes [provider]  potassium chloride SA (KLOR-CON) 20 MEQ tablet Take 1 tablet (20 mEq total) by mouth daily. 01/24/21  Yes Heilingoetter, Cassandra L, PA-C  prochlorperazine (COMPAZINE) 10 MG tablet Take 1 tablet (10 mg total) by mouth every 6 (six) hours as needed. Patient taking differently: Take 10 mg by mouth every 6 (six) hours as needed for nausea or vomiting. 01/24/21  Yes Heilingoetter, Cassandra L, PA-C  vitamin B-12 (CYANOCOBALAMIN) 500 MCG tablet Take 500 mcg by mouth daily.   Yes [provider]    The patient is critically ill with multiple organ  systems failure and requires high complexity decision making for assessment and support, frequent evaluation and titration of therapies, application of advanced monitoring technologies and extensive interpretation of multiple databases. Critical Care Time devoted to patient care services described in this note independent of APP/resident time (if applicable)  is 30 minutes.   Sherrilyn Rist MD [facility] Personal pager: See Amion If unanswered, please page CCM On-call: [phone]

## 2021-01-29 DIAGNOSIS — I82452 Acute embolism and thrombosis of left peroneal vein: Secondary | ICD-10-CM | POA: Diagnosis not present

## 2021-01-29 DIAGNOSIS — D65 Disseminated intravascular coagulation [defibrination syndrome]: Secondary | ICD-10-CM | POA: Diagnosis not present

## 2021-01-29 DIAGNOSIS — J9621 Acute and chronic respiratory failure with hypoxia: Secondary | ICD-10-CM | POA: Diagnosis not present

## 2021-01-29 DIAGNOSIS — I5023 Acute on chronic systolic (congestive) heart failure: Secondary | ICD-10-CM | POA: Diagnosis not present

## 2021-01-29 DIAGNOSIS — Z515 Encounter for palliative care: Secondary | ICD-10-CM | POA: Diagnosis not present

## 2021-01-29 DIAGNOSIS — Z7189 Other specified counseling: Secondary | ICD-10-CM | POA: Diagnosis not present

## 2021-01-29 DIAGNOSIS — I509 Heart failure, unspecified: Secondary | ICD-10-CM | POA: Diagnosis not present

## 2021-01-29 DIAGNOSIS — C3492 Malignant neoplasm of unspecified part of left bronchus or lung: Secondary | ICD-10-CM | POA: Diagnosis not present

## 2021-01-29 LAB — BASIC METABOLIC PANEL
Anion gap: 8 (ref 5–15)
BUN: 28 mg/dL — ABNORMAL HIGH (ref 8–23)
CO2: 25 mmol/L (ref 22–32)
Calcium: 7.5 mg/dL — ABNORMAL LOW (ref 8.9–10.3)
Chloride: 103 mmol/L (ref 98–111)
Creatinine, Ser: 0.95 mg/dL (ref 0.44–1.00)
GFR, Estimated: >60 mL/min (ref >60)
Glucose, Bld: 466 mg/dL — ABNORMAL HIGH (ref 70–99)
Potassium: 3.5 mmol/L (ref 3.5–5.1)
Sodium: 136 mmol/L (ref 135–145)

## 2021-01-29 LAB — GLUCOSE, CAPILLARY
Glucose-Capillary: 167 mg/dL — ABNORMAL HIGH (ref 70–99)
Glucose-Capillary: 174 mg/dL — ABNORMAL HIGH (ref 70–99)
Glucose-Capillary: 178 mg/dL — ABNORMAL HIGH (ref 70–99)
Glucose-Capillary: 190 mg/dL — ABNORMAL HIGH (ref 70–99)

## 2021-01-29 LAB — CBC WITH DIFFERENTIAL/PLATELET
Band Neutrophils: 1 %
Basophils Relative: 0 %
Blasts: NONE SEEN %
Eosinophils Relative: 0 %
HCT: 25.5 % — ABNORMAL LOW (ref 36.0–46.0)
Hemoglobin: 7.7 g/dL — ABNORMAL LOW (ref 12.0–15.0)
Lymphocytes Relative: 1 %
MCH: 30.9 pg (ref 26.0–34.0)
MCHC: 30.2 g/dL (ref 30.0–36.0)
MCV: 102.4 fL — ABNORMAL HIGH (ref 80.0–100.0)
Metamyelocytes Relative: NONE SEEN %
Monocytes Relative: 0 %
Myelocytes: NONE SEEN %
Neutrophils Relative %: 98 %
Platelets: 84 10*3/uL — ABNORMAL LOW (ref 150–400)
Promyelocytes Relative: NONE SEEN %
RBC Morphology: NORMAL
RBC: 2.49 MIL/uL — ABNORMAL LOW (ref 3.87–5.11)
RDW: 18.7 % — ABNORMAL HIGH (ref 11.5–15.5)
WBC Morphology: NORMAL
WBC: 22.5 10*3/uL — ABNORMAL HIGH (ref 4.0–10.5)
nRBC: 1 /100 WBC — ABNORMAL HIGH
nRBC: 1.3 % — ABNORMAL HIGH (ref 0.0–0.2)

## 2021-01-29 LAB — MAGNESIUM: Magnesium: 2.2 mg/dL (ref 1.7–2.4)

## 2021-01-29 LAB — HEPARIN LEVEL (UNFRACTIONATED): Heparin Unfractionated: >1.1 IU/mL — ABNORMAL HIGH (ref 0.30–0.70)

## 2021-01-29 MED ORDER — LORAZEPAM 2 MG/ML IJ SOLN
0.5000 mg | Freq: Four times a day (QID) | INTRAMUSCULAR | Status: DC | PRN
  Administered 2021-01-29: 0.5 mg via INTRAVENOUS
  Filled 2021-01-29: qty 1

## 2021-01-29 MED ORDER — ENOXAPARIN SODIUM 80 MG/0.8ML IJ SOSY
70.0000 mg | PREFILLED_SYRINGE | Freq: Two times a day (BID) | INTRAMUSCULAR | Status: DC
  Administered 2021-01-29: 70 mg via SUBCUTANEOUS
  Filled 2021-01-29: qty 0.7

## 2021-01-29 MED ORDER — MORPHINE 100MG IN NS 100ML (1MG/ML) PREMIX INFUSION
1.0000 mg/h | INTRAVENOUS | Status: DC
Start: 2021-01-29 — End: 2021-01-29
  Administered 2021-01-29: 1 mg/h via INTRAVENOUS
  Filled 2021-01-29: qty 100

## 2021-01-29 MED ORDER — FUROSEMIDE 10 MG/ML IJ SOLN
20.0000 mg | Freq: Two times a day (BID) | INTRAMUSCULAR | Status: DC
  Administered 2021-01-29: 20 mg via INTRAVENOUS
  Filled 2021-01-29: qty 2

## 2021-01-29 MED ORDER — SODIUM CHLORIDE 0.45 % IV SOLN: INTRAVENOUS | Status: DC

## 2021-01-29 MED ORDER — HYDROMORPHONE BOLUS VIA INFUSION
0.5000 mg | INTRAVENOUS | Status: DC | PRN
  Filled 2021-01-29: qty 1

## 2021-01-29 MED ORDER — HYDROMORPHONE HCL 1 MG/ML IJ SOLN
1.0000 mg | Freq: Once | INTRAMUSCULAR | Status: AC
  Administered 2021-01-29: 1 mg via INTRAVENOUS
  Filled 2021-01-29: qty 1

## 2021-01-29 MED ORDER — INSULIN ASPART 100 UNIT/ML IJ SOLN
0.0000 [IU] | Freq: Four times a day (QID) | INTRAMUSCULAR | Status: DC
  Administered 2021-01-29: 2 [IU] via SUBCUTANEOUS

## 2021-01-29 MED ORDER — POTASSIUM CHLORIDE 10 MEQ/100ML IV SOLN
10.0000 meq | INTRAVENOUS | Status: AC
  Administered 2021-01-29 (×4): 10 meq via INTRAVENOUS
  Filled 2021-01-29 (×5): qty 100

## 2021-01-29 MED ORDER — MORPHINE BOLUS VIA INFUSION
1.0000 mg | INTRAVENOUS | Status: DC | PRN
  Administered 2021-01-29: 1 mg via INTRAVENOUS
  Filled 2021-01-29: qty 1

## 2021-01-29 MED ORDER — POTASSIUM PHOSPHATES 15 MMOLE/5ML IV SOLN
30.0000 mmol | Freq: Once | INTRAVENOUS | Status: AC
  Administered 2021-01-29: 30 mmol via INTRAVENOUS
  Filled 2021-01-29: qty 10

## 2021-01-29 MED ORDER — METOPROLOL TARTRATE 25 MG/10 ML ORAL SUSPENSION
25.0000 mg | Freq: Two times a day (BID) | ORAL | Status: DC
  Administered 2021-01-29: 25 mg via ORAL
  Filled 2021-01-29 (×2): qty 10

## 2021-01-29 MED ORDER — SODIUM CHLORIDE 0.9 % IV SOLN
1.0000 mg/h | INTRAVENOUS | Status: DC
  Administered 2021-01-29: 1 mg/h via INTRAVENOUS
  Filled 2021-01-29: qty 5

## 2021-01-29 MED ORDER — LORAZEPAM 2 MG/ML IJ SOLN
1.0000 mg | Freq: Four times a day (QID) | INTRAMUSCULAR | Status: DC
  Administered 2021-01-29: 1 mg via INTRAVENOUS
  Filled 2021-01-29: qty 1

## 2021-01-30 ENCOUNTER — Telehealth: Payer: Self-pay | Admitting: Medical Oncology

## 2021-01-30 NOTE — Telephone Encounter (Signed)
err

## 2021-01-31 LAB — CULTURE, BLOOD (ROUTINE X 2)
Culture: NO GROWTH
Culture: NO GROWTH

## 2021-02-01 ENCOUNTER — Other Ambulatory Visit (HOSPITAL_COMMUNITY): Payer: Self-pay

## 2021-02-03 ENCOUNTER — Other Ambulatory Visit (HOSPITAL_COMMUNITY): Payer: Self-pay

## 2021-02-06 ENCOUNTER — Other Ambulatory Visit (HOSPITAL_COMMUNITY): Payer: Self-pay

## 2021-02-06 NOTE — Progress Notes (Signed)
Daily Progress Note   Patient Name: Heather [name]       Date: 2021-02-07 DOB: 07/04/1941  Age: 80 y.o. MRN#: [id number] Attending Physician: Eugenie Filler, MD Primary Care Physician: Rosita Fire, MD Admit Date: 01/10/2021  Reason for Consultation/Follow-up: Establishing goals of care  Subjective: Patient opens eyes when her name is called, she has marked dyspnea, is using accessory muscles of respiration, she is in moderate distress.   Length of Stay: 4  Current Medications: Scheduled Meds:  . arformoterol  15 mcg Nebulization BID  . Chlorhexidine Gluconate Cloth  6 each Topical Daily  . furosemide  20 mg Intravenous Q12H  . ipratropium  0.5 mg Nebulization Q6H  . levalbuterol  0.63 mg Nebulization Q6H  . LORazepam  1 mg Intravenous Q6H  . mouth rinse  15 mL Mouth Rinse BID  . methylPREDNISolone (SOLU-MEDROL) injection  40 mg Intravenous Q12H  . metoprolol tartrate  25 mg Oral BID  . pantoprazole (PROTONIX) IV  40 mg Intravenous Q24H    Continuous Infusions: . sodium chloride 20 mL/hr at 02-07-21 1059  . morphine    . potassium chloride 10 mEq (Feb 07, 2021 1247)  . potassium PHOSPHATE IVPB (in mmol) 85 mL/hr at 2021/02/07 1059    PRN Meds: acetaminophen, lip balm, LORazepam, morphine **AND** morphine, prochlorperazine  Physical Exam         Patient is on heated high flow nasal cannula, she has coarse diffuse breath sounds, appears more rhonchorous.  Restless Increased work of breathing Monitor noted.  Has edema   Vital Signs: BP 125/83   Pulse 97   Temp 97.8 F (36.6 C) (Axillary)   Resp (!) 40   Ht 5\' 7"  (1.702 m)   Wt 68.5 kg   SpO2 93%   BMI 23.65 kg/m  SpO2: SpO2: 93 % O2 Device: O2 Device: High Flow Nasal Cannula O2 Flow Rate: O2 Flow Rate (L/min):  10 L/min  Intake/output summary:  Intake/Output Summary (Last 24 hours) at 02/07/21 1254 Last data filed at 02-07-21 1059 Gross per 24 hour  Intake 2792.58 ml  Output 500 ml  Net 2292.58 ml    LBM: Last BM Date:  (PTA) Baseline Weight: Weight: 70 kg Most recent weight: Weight: 68.5 kg       Palliative Assessment/Data:      Patient  Active Problem List   Diagnosis Date Noted  . Palliative care by specialist   . DIC (disseminated intravascular coagulation) ([place]) 01/26/2021  . Malnutrition of moderate degree 01/26/2021  . Acute on chronic congestive heart failure ([place])   . Hypokalemia   . Acute on chronic respiratory failure with hypoxia ([place]) 01/30/2021  . Atrial fibrillation with RVR ([place]) 01/22/2021  . Left leg DVT ([place]) 01/19/2021  . Decubitus ulcer of sacral region, stage 1 02/02/2021  . Acute on chronic systolic CHF (congestive heart failure) ([place]) 01/30/2021  . CAP (community acquired pneumonia) 01/09/2021  . Atherosclerosis of native artery of left lower extremity with gangrene ([place]) 01/11/2021  . Adenocarcinoma of left lung, stage 4 ([place]) 12/15/2020  . Prolapsed internal hemorrhoids, grade 3 03/25/2020  . Grade III hemorrhoids 11/06/2019  . Primary adenocarcinoma of upper lobe of left lung ([place]) 08/07/2017  . Malignant neoplasm of upper lobe of left lung ([place]) 08/01/2017  . History of adenomatous polyp of colon 02/06/2017  . Ascending aortic aneurysm ([place]) 07/20/2014  . Non-small cell carcinoma of right lung, stage 1 ([place]) 07/20/2014  . Constipation 01/07/2012  . Goals of care, counseling/discussion 01/07/2012  . Tobacco abuse   . Lung nodule 08/21/2011    Palliative Care Assessment & Plan   Patient Profile:  80 y.o. female who was living independently at home alone.  She has a past medical history of non small cell lung cancer s/p RU lobectomy in 2013 with recurrence in 2019, CHF, afib/flutter, constipation, tobacco use, PVD s/p stenting, she underwent  amputation of left 4th and 5th toe last week.  She was admitted on 01/08/2021 with shortness of breath.  She was placed at [facility] SNF 3 days.   The patient was placed on BiPAP but did not tolerate it.  DDX for SOB is likely multifactorial.  She was found to have a DVT but no PE.  She is being treated for community acquired pneumonia.  Imaging shows a significant increase in [facility] tumor in her LUL from 1.6 x 1.4 to 4.1 x 4.3, BNP was slightly elevated. Echocardiogram shows a decrease in her LVEF from 30-35% to 20-25%. Oncology started immunotherapy with Tagrisso on 6/22 and there is concern for possible pulmonary toxicity.  In addition the patient has probable DIC thought to be due to [facility].   She is quite encephalopathic, barely taking POs and refused placement of a cortrak tube.  Assessment: Acute on chronic hypoxic respiratory failure, deemed multifactorial in etiology because of patient's worsening non-small cell lung cancer stage IV, possible community-acquired pneumonia, component of acute congestive heart failure secondary to atrial fibrillation with rapid ventricular response, concern for possible pulmonary arterial hypertension as well as Tagrisso related pulmonary toxicity. Probable DIC DVT Poor oral intake, ongoing functional decline  Recommendations/Plan: Family meeting: Patient seen and examined, discussed with daughter [name] and 2 sons outside the patient's room: Patient appears to have entered into the active dying process, she has increased work of breathing, has generalized distress. We talked about comfort measures, appropriate symptom management and avoiding suffering. Family is in agreement to proceed with comfort care.  Plan: Low dose Morphine infusion with provision for boluses.  Ativan IV scheduled and PRN Chaplain consult D/C interventions no longer contributing directly to comfort. Anticipated hospital death Prognosis hours to some very limited number of days in my opinion.        Code Status:    Code Status Orders  (From admission, onward)  Start     Ordered   01/27/21 1648  Do not attempt resuscitation (DNR)  Continuous       Question Answer Comment  In the event of cardiac or respiratory ARREST Do not call a "code blue"   In the event of cardiac or respiratory ARREST Do not perform Intubation, CPR, defibrillation or ACLS   In the event of cardiac or respiratory ARREST Use medication by any route, position, wound care, and other measures to relive pain and suffering. May use oxygen, suction and manual treatment of airway obstruction as needed for comfort.      01/27/21 1647           Code Status History     Date Active Date Inactive Code Status Order ID Comments User Context   01/31/2021 2027 01/27/2021 1647 Full Code [id number]  Etta Quill, DO ED   01/11/2021 1106 01/21/2021 0042 Full Code [id number]  Cherre Robins, MD ED       Prognosis:  Hours to days  Discharge Planning: Anticipated hospital death.   Care plan was discussed with bedside RN, interdisciplinary team, patient's daughter [name]  and her 2 sons.      Time In: 12 Time Out: 1245 Total Time 45 Prolonged Time Billed  no       Greater than 50%  of this time was spent counseling and coordinating care related to the above assessment and plan.  Loistine Chance, MD  Please contact Palliative Medicine Team phone at [phone] for questions and concerns.

## 2021-02-06 NOTE — Progress Notes (Signed)
Patient heart rate noted to be sustaining 30-40s, family made aware of change. Patient went asystole, time of death confirmed by Cyndie Chime, RN and Rayfield Citizen, RN at [phone]. [place], NP paged. Bed placement card given to family.

## 2021-02-06 NOTE — Progress Notes (Signed)
[place] for IV heparin Indication: New Afib/RVR, + DVT  Allergies  Allergen Reactions   Ibuprofen Nausea And Vomiting and Other (See Comments)    "Allergic," per MAR   Powder Rash and Other (See Comments)    NO gloves with powder- "Allergic," per [facility]    Patient Measurements: Height: 5\' 7"  (170.2 cm) Weight: 68.5 kg (151 lb 0.2 oz) IBW/kg (Calculated) : 61.6 Heparin Dosing Weight: TBW (used [place] nomogram for initial dosing)  Vital Signs: Temp: 97.5 F (36.4 C) (07/24 0011) Temp Source: Axillary (07/24 0011) BP: 147/95 (07/24 0500) Pulse Rate: 102 (07/24 0500)  Labs: Recent Labs    01/26/21 0855 01/26/21 0855 01/27/21 0302 01/28/21 0629 01/28/21 1713 16-Feb-2021 0242  HGB  --    < > 9.5* 8.6*  --  7.7*  HCT  --   --  29.3* 27.2*  --  25.5*  PLT  --   --  66* 95*  96*  --  84*  APTT 172*  --   --  176*  --   --   LABPROT 22.7*  --   --  19.6*  --   --   INR 2.0*  --   --  1.7*  --   --   HEPARINUNFRC 0.49  --  0.60 0.80* 0.58 >1.10*  CREATININE 1.35*  --  1.29* 1.25*  --  0.95   < > = values in this interval not displayed.     Estimated Creatinine Clearance: 45.9 mL/min (by C-G formula based on SCr of 0.95 mg/dL).   Medical History: Past Medical History:  Diagnosis Date   Ascending aortic aneurysm ([place]) 07/20/2014   4.1 cm by CT   Atrial fib/flutter, transient    post-op   CHF (congestive heart failure) ([place])    a. EF 30-35% by echo in 2013 --> similar results by repeat imaging in 2016 and 11/2017.   Constipation    corrects with diet   [place], vitreous    Right   Hypertension    Nonsquamous nonsmall cell neoplasm of lung ([place]) 09/2011   Stage IB- s/p right upper lobectomy   Pneumonia    Psoriasis    Ankles   Tobacco abuse    Scheduled:   arformoterol  15 mcg Nebulization BID   atorvastatin  40 mg Oral QHS   budesonide (PULMICORT) nebulizer solution  0.5 mg Nebulization BID   Chlorhexidine Gluconate  Cloth  6 each Topical Daily   cyanocobalamin  1,000 mcg Subcutaneous Daily   digoxin  0.125 mg Intravenous Daily   ipratropium  0.5 mg Nebulization Q6H   levalbuterol  0.63 mg Nebulization Q6H   mouth rinse  15 mL Mouth Rinse BID   methylPREDNISolone (SOLU-MEDROL) injection  40 mg Intravenous Q12H   metoprolol tartrate  12.5 mg Oral BID   pantoprazole (PROTONIX) IV  40 mg Intravenous Q24H    Assessment: 69 yoF with PMH HTN, transient Afib/flutter, AAA, lung cancer on oral chemo, who was recently admitted 7/5 - 7/15 for amputation of ischemic L 5th toe and subsequently sent to rehab, now presenting with SOB and found to be in [place].  02/02/2021 - Also concern for DIC as patient with elevated PTT > 200, fibrinogen of < 60, D-dimer of 17.35, smear with schistocytes noted.  BLE doppler positive for L peroneal DVT;  CTA : negative PE.   Baseline INR, aPTT: not done, aPTTs resulted after Heparin started Prior anticoagulation: none, does take ASA +  Plavix PTA Baseline INR elevated 3.2  Today, 2021-02-18: Heparin level =  >1.1 this morning.  Lab drawn peripherally from same hand with heparin infusion at wrist site so this is likely falsely elevated.   Heparin infusing at 900 units/hr CBC: Hgb 7.7- low & trending down, Plt 84 -low, improved from admission SCr improved 0.95 Noted hematoma RUE where IV site pulled 7/20, no further bleed S/P 7/13 L toe amputations, 4th & 5th: No bleeding per RN No bleeding or line interruptions reported from nurse  Goal of Therapy: Heparin level 0.3-0.7 units/ml Monitor platelets by anticoagulation protocol: Yes  Plan: Continue heparin to 900 units/hr IV infusion Will f/u with Dr Grandville Silos re: options for monitoring this lady's anticoagulation Daily CBC, daily heparin level Monitor for signs of bleeding or thrombosis   Netta Cedars, PharmD, BCPS February 18, 2021 5:44 AM

## 2021-02-06 NOTE — Progress Notes (Signed)
SLP Cancellation Note  Patient Details Name: Heather [name] MRN: [id number] DOB: 09/12/1940   Cancelled treatment:       Reason Eval/Treat Not Completed: Patient is now on comfort measures per MD report and most recent palliative note.  SLP will s/o at this time.    Elvia Collum Annora Guderian Feb 14, 2021, 2:01 PM

## 2021-02-06 NOTE — Progress Notes (Addendum)
PROGRESS NOTE    Heather [name]  ZOX:[id number] DOB: [birth year] DOA: 01/09/2021 PCP: Rosita Fire, MD    Chief Complaint  Patient presents with   Shortness of Breath    Brief Narrative: Patient is a 80 year old female history of non-small cell lung cancer, stage IV, prior transient paroxysmal A. fib not on anticoagulation, hypertension, PAD with recent amputation of 2 toes on the left foot due to necrosis last week, chronic systolic CHF with EF 30 to 35% on prior 2D echo presented to the ED with worsening shortness of breath, increased O2 requirements and requiring at least 6 L of O2 at facility.  Patient had presented with worsening shortness of breath.  Patient noted to have a progressive decline over the past month.  Work-up in the ED including CT angiogram chest concerning for worsening non-small cell lung cancer, lower extremity Dopplers done concerning for left lower extremity peroneal DVT and patient started on heparin, DIC panel obtained concerning for DIC.  Patient on Cardizem drip.  Patient placed on empiric IV antibiotics for possible pneumonia.  Patient with worsening respiratory status, trial of BiPAP done on admission discontinued due to nausea and possible emesis.  Oncology consulted.  We will consult with PCCM due to worsening respiratory status, impending DIC.  Palliative care consultation for goals of care.   Assessment & Plan:   Principal Problem:   Acute on chronic respiratory failure with hypoxia (HCC) Active Problems:   DIC (disseminated intravascular coagulation) ([place])   Goals of care, counseling/discussion   Adenocarcinoma of left lung, stage 4 (HCC)   Atherosclerosis of native artery of left lower extremity with gangrene (HCC)   Atrial fibrillation with RVR (HCC)   Left leg DVT (HCC)   Decubitus ulcer of sacral region, stage 1   Acute on chronic systolic CHF (congestive heart failure) (HCC)   CAP (community acquired pneumonia)   Acute on chronic congestive  heart failure (HCC)   Hypokalemia   Malnutrition of moderate degree   Palliative care by specialist   1 acute on chronic respiratory failure with hypoxia -Likely multifactorial secondary to worsening non-small cell lung cancer stage IV in the setting of possible CAP, component of acute CHF secondary to A. fib with RVR, possible PAH, concern for Tagrisso pulmonary toxicity. -Patient initially placed on the BiPAP which had to be discontinued due to onset of nausea. -Patient with tachypnea, respiratory distress with increased use of accessory muscles of respiration, high risk for intubation.   -Continue empiric IV antibiotics, IV Solu-Medrol 40 mg every 12 hours, IV PPI, IV Rocephin, IV azithromycin. -Continue scheduled Xopenex, Atrovent nebs, Pulmicort nebs, Brovana nebs. -Decrease IV fluids to 20 cc an hour. -Trial of Lasix 20 mg IV every 12 hours x2 doses. -Monitor urine output. -PCCM consulted and are following.  -Oncology consulted and following. -Patient with poor prognosis seen by palliative care and currently DNR.  -Palliative care ff.  -Continue Ativan as needed anxiety, air hunger as ordered per palliative care.    2.  Probable DIC -Questionable etiology, likely secondary to worsening malignancy and possible infection etiology -Concern for DIC as patient with elevated PTT > 200, fibrinogen of < 60, D-dimer of 17.35, smear with schistocytes noted.  Patient on heparin drip secondary to left lower extremity DVT. -Hematology/oncology consulted and following. -DIC panel with improvement with lab work. -PCCM following and appreciate input and recommendations. -Continue IV antibiotics. -Heparin being transitioned to full dose Lovenox. -Follow.  3.  Suspected CAP -MRSA PCR negative. -COVID-19 PCR  negative. -Continue empiric IV azithromycin, IV Rocephin.  4.  A. fib with RVR ( CHA2DS2VASC score 8)/PVCs -Was on Cardizem drip however discontinued and patient started on IV digoxin per  cardiology.  -Per RN patient unable to tolerate oral pills and as such Toprol-XL changed to oral Lopressor twice daily.  -2D echo with EF of 20 to 25%, left ventricular global hypokinesis, severely dilated left atrial size, mildly dilated right atrial size, small pericardial effusion present.  -Patient noted to have some PVCs and heart rate fluctuating from the 80s to the 120s. -Increase Lopressor to 25 mg p.o. twice daily. -Continue digoxin. -DC heparin drip and place on full dose Lovenox. -Cardiology following.  5.  Acute on chronic systolic CHF -Likely transient secondary to A. fib with RVR. -Patient currently does not look volume overloaded on examination however patient on 10 L high flow nasal cannula, diffuse rhonchorous breath sounds with crackles noted.  -Patient with poor oral intake.   -2D echo with EF of 20 to 25%, left ventricular global hypokinesis, severely dilated left atrial size, mildly dilated right atrial size, small pericardial effusion present.   -Strict I's and O's  -Daily weights.   -Due to worsening EF and patient presentation with acute respiratory distress patient seen in consultation by cardiology.   -Decrease IV fluids to 20 cc an hour. -Cardizem drip discontinued and patient started on digoxin per cardiology.   -Patient currently n.p.o. as patient with significant aspiration risk.   -Lasix 20 mg IV every 12 hours x2 doses. -Monitor urine output and renal function. -Cardiology following and appreciate input and recommendations.    6.  Non-small cell lung cancer -Worsening non-small cell lung cancer per CT scan. -Tagrisso on hold secondary to problem #1.   -Oncology consulted and following and are in agreement with holding Tagrisso at this time.   -Per oncology.  7.  Left lower extremity DVT -CT angiogram chest negative for PE. -DC heparin drip and placed on full dose Lovenox.   -Hematology/oncology following.   8.  Thrombocytopenia -Patient with a DVT  left lower extremity. -Thrombocytopenia likely secondary to DIC versus consumption of platelets by DVT.   -Platelet count fluctuating. -Discontinue heparin drip and placed on full dose Lovenox for DVT.  -Hematology/oncology following.    9.  Left lower extremity gangrene of the toes -Status post amputation of fourth and fifth toes last week. -Second and third toes with bluish discoloration. -Monitor closely. -Continue wound dressing. -Patient significantly ill at this time and doubt if patient he will be able to tolerate any kind of procedure and as such we will monitor for now.   -Discussed with family who is in agreement.   10.  Hypertension -Blood pressure was soft but improved with gentle hydration.  -Decrease IV fluids to 20 cc an hour.   -Lopressor.    11.  Hypokalemia -Potassium at 3.5 this morning.   -Patient noted to have some PVCs.  - -KCl 10 mEq IV every hour x5 runs.   12.  Dysphagia -Patient assessed by speech therapy and patient noted to be at severe aspiration risk at this time and recommending n.p.o. with ice chips, teaspoons of thin water.  Cortrack ordered however patient noted not to be able to tolerate it.  In discussion with family it is noted that patient would not want means of artificial feeding. -SLP to reassess. -Palliative care following.  14.  Hypernatremia -Likely secondary to volume depletion and dehydration. -Improved on D5W.   -Discontinue D5W  placed on half-normal saline at 20 cc an hour.  -Repeat labs in the morning.      DVT prophylaxis: Heparin drip>>> Lovenox Code Status: DNR Family Communication: Updated patient, updated son, Delrae Alfred at bedside. Disposition:   Status is: Inpatient  Remains inpatient appropriate because:IV treatments appropriate due to intensity of illness or inability to take PO  Dispo: The patient is from: SNF              Anticipated d/c is to:  TBD              Patient currently is not medically stable to d/c.    Difficult to place patient No       Consultants:  Oncology PCCM: Dr.Olalere 01/26/2021 Cardiology Dr. Oval Linsey 01/27/2021 Palliative care: Dr. Rowe Pavy 01/27/2021  Procedures:  CT angiogram chest 02/02/2021 Chest x-ray 01/28/2021, 01/26/2021 Lower extremity Doppler 01/14/2021 2D echo 01/26/2021    Antimicrobials:  IV azithromycin 01/24/2021>>>> IV Rocephin 01/26/2021>>>>   Subjective: Sleeping but arousable.  Denies any chest pain.  Patient dyspneic with conversation and on minimal exertion.  No abdominal pain.  Speech somewhat not understandable.  Occasional PVCs noted on monitor.     Objective: Vitals:   02-12-2021 0600 2021/02/12 0700 Feb 12, 2021 0819 02/12/21 0909  BP: (!) 141/103 125/90 (!) 140/97   Pulse: 83 (!) 134 (!) 110   Resp: (!) 37 (!) 29    Temp:    97.8 F (36.6 C)  TempSrc:    Axillary  SpO2: 92% 90%    Weight:      Height:        Intake/Output Summary (Last 24 hours) at 02/12/21 1001 Last data filed at 02-12-2021 8469 Gross per 24 hour  Intake 2908.07 ml  Output 500 ml  Net 2408.07 ml    Filed Weights   01/27/21 0814 01/28/21 0500 2021/02/12 0425  Weight: 68.3 kg 68 kg 68.5 kg    Examination:  General exam: On 10 L high flow nasal cannula.  Short of breath with conversation and minimal exertion. Respiratory system: Tachypneic.  Diffuse coarse rhonchorous breath sounds.  Crackles noted.  No wheezing.  Dyspnea with conversation.  Cardiovascular system: Irregularly irregular.  No JVD.  No murmurs rubs or gallops.  No significant lower extremity edema.  Gastrointestinal system: Abdomen is soft, nontender, nondistended, positive bowel sounds.  No rebound.  No guarding. Central nervous system: Lethargic/drowsy.  Moving extremities spontaneously.  Extremities: Left foot wrapped in bandage with bluish discoloration of second and third toes.  Right upper extremity soft however swollen with ecchymosis. Skin: No rashes, lesions or ulcers Psychiatry: Judgement and  insight appear poor to fair. Mood & affect flat.     Data Reviewed: I have personally reviewed following labs and imaging studies  CBC: Recent Labs  Lab 01/24/21 0958 01/24/21 0958 01/21/2021 1406 02/02/2021 2100 01/26/21 0100 01/27/21 0302 01/28/21 0629 12-Feb-2021 0242  WBC 15.8*  --  15.2*  --  17.9* 17.8* 25.0* 22.5*  NEUTROABS 12.1*  --  10.7*  --   --  13.6* 19.3*  --   HGB 10.5*  --  11.5*  --  10.7* 9.5* 8.6* 7.7*  HCT 30.5*  --  35.3*  --  32.5* 29.3* 27.2* 25.5*  MCV 91.3  --  95.4  --  95.3 96.7 101.1* 102.4*  PLT 98*   < > 73* 76* 72* 66* 95*  96* 84*   < > = values in this interval not displayed.     Basic Metabolic  Panel: Recent Labs  Lab 01/26/21 0100 01/26/21 0855 01/27/21 0302 01/27/21 1223 01/27/21 1632 01/28/21 0629 01/28/21 1713 February 05, 2021 0242  NA 146* 147* 148*  --   --  149*  --  136  K 3.3* 3.5 3.8  --   --  3.0*  --  3.5  CL 105 107 109  --   --  112*  --  103  CO2 29 30 26   --   --  29  --  25  GLUCOSE 118* 99 118*  --   --  154*  --  466*  BUN 49* 53* 53*  --   --  41*  --  28*  CREATININE 1.24* 1.35* 1.29*  --   --  1.25*  --  0.95  CALCIUM 9.9 9.6 9.0  --   --  8.6*  --  7.5*  MG  --   --  2.3  --   --  2.5*  --  2.2  PHOS  --   --   --  3.7 3.2 2.7 2.1*  --      GFR: Estimated Creatinine Clearance: 45.9 mL/min (by C-G formula based on SCr of 0.95 mg/dL).  Liver Function Tests: Recent Labs  Lab 01/24/21 0958 01/12/2021 1406 01/26/21 0855 01/27/21 0302 01/28/21 0629  AST 55* 58* 52* 43* 33  ALT 58* 66* 48* 44 39  ALKPHOS 202* 219* 156* 144* 129*  BILITOT 1.3* 1.5* 0.9 1.0 0.8  PROT 6.5 7.2 5.6* 5.5* 5.6*  ALBUMIN 3.2* 3.8 3.0* 2.8* 2.9*     CBG: Recent Labs  Lab 01/28/21 1629 01/28/21 2038 05-Feb-2021 0016 2021-02-05 0423 05-Feb-2021 0830  GLUCAP 171* 159* 167* 174* 178*      Recent Results (from the past 240 hour(s))  SARS CORONAVIRUS 2 (TAT 6-24 HRS) Nasopharyngeal Nasopharyngeal Swab     Status: None   Collection Time:  01/19/21  8:44 PM   Specimen: Nasopharyngeal Swab  Result Value Ref Range Status   SARS Coronavirus 2 NEGATIVE NEGATIVE Final    Comment: (NOTE) SARS-CoV-2 target nucleic acids are NOT DETECTED.  The SARS-CoV-2 RNA is generally detectable in upper and lower respiratory specimens during the acute phase of infection. Negative results do not preclude SARS-CoV-2 infection, do not rule out co-infections with other pathogens, and should not be used as the sole basis for treatment or other patient management decisions. Negative results must be combined with clinical observations, patient history, and epidemiological information. The expected result is Negative.  Fact Sheet for Patients: SugarRoll.be  Fact Sheet for Healthcare Providers: https://www.woods-mathews.com/  This test is not yet approved or cleared by the [place] FDA and  has been authorized for detection and/or diagnosis of SARS-CoV-2 by FDA under an Emergency Use Authorization (EUA). This EUA will remain  in effect (meaning this test can be used) for the duration of the COVID-19 declaration under Se ction 564(b)(1) of the Act, 21 U.S.C. section 360bbb-3(b)(1), unless the authorization is terminated or revoked sooner.  Performed at [facility], [place] [address], [place], [place] [address]   Resp Panel by RT-PCR (Flu A&B, Covid) Nasopharyngeal Swab     Status: None   Collection Time: 01/11/2021  2:06 PM   Specimen: Nasopharyngeal Swab; Nasopharyngeal(NP) swabs in vial transport medium  Result Value Ref Range Status   SARS Coronavirus 2 by RT PCR NEGATIVE NEGATIVE Final    Comment: (NOTE) SARS-CoV-2 target nucleic acids are NOT DETECTED.  The SARS-CoV-2 RNA is generally detectable in  upper respiratory specimens during the acute phase of infection. The lowest concentration of SARS-CoV-2 viral copies this assay can detect is 138 copies/mL. A negative result does not preclude  SARS-Cov-2 infection and should not be used as the sole basis for treatment or other patient management decisions. A negative result may occur with  improper specimen collection/handling, submission of specimen other than nasopharyngeal swab, presence of viral mutation(s) within the areas targeted by this assay, and inadequate number of viral copies(<138 copies/mL). A negative result must be combined with clinical observations, patient history, and epidemiological information. The expected result is Negative.  Fact Sheet for Patients:  EntrepreneurPulse.com.au  Fact Sheet for Healthcare Providers:  IncredibleEmployment.be  This test is no t yet approved or cleared by the [place] FDA and  has been authorized for detection and/or diagnosis of SARS-CoV-2 by FDA under an Emergency Use Authorization (EUA). This EUA will remain  in effect (meaning this test can be used) for the duration of the COVID-19 declaration under Section 564(b)(1) of the Act, 21 U.S.C.section 360bbb-3(b)(1), unless the authorization is terminated  or revoked sooner.       Influenza A by PCR NEGATIVE NEGATIVE Final   Influenza B by PCR NEGATIVE NEGATIVE Final    Comment: (NOTE) The Xpert Xpress SARS-CoV-2/FLU/RSV plus assay is intended as an aid in the diagnosis of influenza from Nasopharyngeal swab specimens and should not be used as a sole basis for treatment. Nasal washings and aspirates are unacceptable for Xpert Xpress SARS-CoV-2/FLU/RSV testing.  Fact Sheet for Patients: EntrepreneurPulse.com.au  Fact Sheet for Healthcare Providers: IncredibleEmployment.be  This test is not yet approved or cleared by the [place] FDA and has been authorized for detection and/or diagnosis of SARS-CoV-2 by FDA under an Emergency Use Authorization (EUA). This EUA will remain in effect (meaning this test can be used) for the duration of  the COVID-19 declaration under Section 564(b)(1) of the Act, 21 U.S.C. section 360bbb-3(b)(1), unless the authorization is terminated or revoked.  Performed at [facility], [place] [address] [place], [place] [address]   MRSA Next Gen by PCR, Nasal     Status: None   Collection Time: 01/14/2021  9:07 PM   Specimen: Nasal Mucosa; Nasal Swab  Result Value Ref Range Status   MRSA by PCR Next Gen NOT DETECTED NOT DETECTED Final    Comment: (NOTE) The GeneXpert MRSA Assay (FDA approved for NASAL specimens only), is one component of a comprehensive MRSA colonization surveillance program. It is not intended to diagnose MRSA infection nor to guide or monitor treatment for MRSA infections. Test performance is not FDA approved in patients less than 87 years old. Performed at [facility], [place] [address] [place], [place] [address]   Culture, blood (Routine X 2) w Reflex to ID Panel     Status: None (Preliminary result)   Collection Time: 01/26/21  8:55 AM   Specimen: BLOOD RIGHT HAND  Result Value Ref Range Status   Specimen Description   Final    BLOOD RIGHT HAND Performed at [place] [address] [place], [place] [address]    Special Requests   Final    BOTTLES DRAWN AEROBIC ONLY Blood Culture results may not be optimal due to an inadequate volume of blood received in culture bottles Performed at [place] [address] [place], [place] [address]    Culture   Final    NO GROWTH 3 DAYS Performed at [facility], [address] [place], [place] [address]    Report Status PENDING  Incomplete  Culture, blood (Routine X 2) w Reflex to ID Panel     Status: None (Preliminary result)   Collection Time: 01/26/21  8:55 AM   Specimen: BLOOD LEFT HAND  Result Value Ref Range Status   Specimen Description   Final    BLOOD LEFT HAND Performed at [place] [address],  [place], [place] [address]    Special Requests   Final    BOTTLES DRAWN AEROBIC ONLY Blood Culture results may not be optimal due to an inadequate volume of blood received in culture bottles Performed at [place] [address] [name], [place] [address]    Culture   Final    NO GROWTH 3 DAYS Performed at [facility], [place] [address], [place], [place] [address]    Report Status PENDING  Incomplete          Radiology Studies: DG CHEST PORT 1 VIEW  Result Date: 01/28/2021 CLINICAL DATA:  Follow-up CHF, lung cancer, dyspnea EXAM: PORTABLE CHEST 1 VIEW COMPARISON:  01/26/2021 chest radiograph. FINDINGS: Stable cardiomediastinal silhouette with moderate cardiomegaly. No pneumothorax. No pleural effusion. Suture lines overlie the right lung apex. Patchy opacities in both lungs, most prominent in the upper left and lower right lung, mildly worsened. IMPRESSION: Moderate cardiomegaly with mildly worsened bilateral patchy lung opacities, most prominent in the upper left and right lower lung, favor pulmonary edema superimposed on known upper left lung cancer. Electronically Signed   By: Ilona Sorrel M.D.   On: 01/28/2021 08:13        Scheduled Meds:  arformoterol  15 mcg Nebulization BID   atorvastatin  40 mg Oral QHS   budesonide (PULMICORT) nebulizer solution  0.5 mg Nebulization BID   Chlorhexidine Gluconate Cloth  6 each Topical Daily   cyanocobalamin  1,000 mcg Subcutaneous Daily   digoxin  0.125 mg Intravenous Daily   enoxaparin (LOVENOX) injection  70 mg Subcutaneous Q12H   furosemide  20 mg Intravenous Q12H   insulin aspart  0-9 Units Subcutaneous Q6H   ipratropium  0.5 mg Nebulization Q6H   levalbuterol  0.63 mg Nebulization Q6H   mouth rinse  15 mL Mouth Rinse BID   methylPREDNISolone (SOLU-MEDROL) injection  40 mg Intravenous Q12H   metoprolol tartrate  25 mg Oral BID   pantoprazole (PROTONIX) IV  40 mg Intravenous Q24H   Continuous Infusions:   sodium chloride 50 mL/hr at 02-21-21 0815   azithromycin Stopped (01/28/21 2136)   cefTRIAXone (ROCEPHIN)  IV Stopped (01/28/21 2228)   potassium chloride 10 mEq (02-21-2021 0832)   potassium PHOSPHATE IVPB (in mmol)       LOS: 4 days    Time spent: 40 mins    Irine Seal, MD [facility]   To contact the attending provider between 7A-7P or the covering provider during after hours 7P-7A, please log into the web site [email or web address] and access using universal [place] password for that web site. If you do not have the password, please call the hospital operator.  02-21-21, 10:01 AM

## 2021-02-06 NOTE — Progress Notes (Signed)
NAME:  Heather [name], MRN:  [id number], DOB:  [birth year], LOS: 4 ADMISSION DATE:  01/12/2021, CONSULTATION DATE:  7/21 REFERRING MD:  Dr. Grandville Silos, CHIEF COMPLAINT:  Shortness of Breath  History of Present Illness:  80 y/o F who presented to [facility] on 7/20 with reports of shortness of breath.   The patient has been most recently living at [facility] after an admission to [facility] from 7/5-7/15 for PAD with LLE ischemic necrosis of the her last two toes.  The patient underwent angiogram for critical limb ischemia on 7/7 and subsequent amputation on 7/13 of her last two toes on the left foot.  She was transferred to SNF on 7/15 for rehab efforts.  It is noted that during that admission she was documented as profoundly deconditioned.   EMS was called at her SNF due to reports of shortness of breath that began 7/20 am.  Her oxygen was increased to 6L and subsequently decreased but the facility felt she should be evaluated and requested EMS transport.  On arrival, she was noted to be altered but in no distress.  EKG showed AFwRVR.  VBG 7.42 / CO2 48.  COVID / influenza testing were negative.  CXR showed cardiomegaly, bilateral pleural effusions, pulmonary edema.  She had LLE swelling and an ultrasound was assessed which was positive for LE DVT.  She had a mild troponin leak thought related to respiratory distress.  Heparin gtt was initiated. CTA of the chest was evaluated to rule out PE and was negative. However, imaging did show an increase in the size of the L apical spiculated pulmonary mass consistent with lung cancer, increase in diffuse patchy ground glass opacities, trace effusions, enlarged pulmonary artery suggestive of PH, cardiomegaly and known abdominal aortic aneurysm (poorly visualized). EKG showed atrial flutter with PVC's.  She was admitted per [facility] for further evaluation.  She was placed on BiPAP for work of breathing but became nauseated and it was removed.  Additional lab work notable for  possible DIC.  PCCM consulted for pulmonary evaluation.    Pertinent  Medical History  Aflutter  CHF  Ascending Aortic Aneurysm PVD - left toe amputations 2022 HTN  COPD - on 2L O2 at baseline  Non-Squamous Non-Small Cell - stage 1B s/p RUL lobectomy 2013, recurrence of disease in LUL s/p stereotatic therapy in 2019, RLL recurrence in 11/2020 -now stage 4 Former Tobacco Abuse   Significant Hospital Events: Including procedures, antibiotic start and stop dates in addition to other pertinent events   7/20 Admit 7/21 PCCM consulted for pulmonary evaluation, assessment of DIC, DVT, and progressive changes to pulmonary nodule in patient with history of adenocarcinoma Encephalopathic minimal interactions 7/24 encephalopathic  Interim History / Subjective:  Minimally interactive, arousable On oxygen supplementation  Objective   Blood pressure 125/83, pulse 97, temperature 97.8 F (36.6 C), temperature source Axillary, resp. rate (!) 40, height 5\' 7"  (1.702 m), weight 68.5 kg, SpO2 93 %.        Intake/Output Summary (Last 24 hours) at February 12, 2021 1106 Last data filed at 12-Feb-2021 1059 Gross per 24 hour  Intake 2792.58 ml  Output 500 ml  Net 2292.58 ml   Filed Weights   01/27/21 0814 01/28/21 0500 Feb 12, 2021 0425  Weight: 68.3 kg 68 kg 68.5 kg    Examination: General: Frail, elderly HEENT: Dry oral mucosa Neuro: Will not follow commands, arousable CV: S1-S2 appreciated PULM: Rhonchi at bases GI: Bowel sounds appreciated Extremities: warm/dry, trace LE edema, left foot wrapped, dusky 2nd/3rd  toe with cap refill >4 seconds/sluggish , L 4th and 5th toes surgically removed.  Bruise right upper arm Skin: no rashes or lesions  Resolved Hospital Problem list     Assessment & Plan:   Acute on chronic hypoxic respiratory failure Recurrent adenocarcinoma of lung Chronic obstructive pulmonary disease -Today will be day 5 of antibiotics -Continue pulmonary hygiene -On  Solu-Medrol  Recurrent adenocarcinoma of the lung Previously treated with SBRT, post right upper lobectomy in 2013 -Was on home Tagrisso, on hold at present -Tagrisso may contribute to interstitial lung disease-low likelihood but possible  DIC  -Platelets improving -Fibrinogen improving  DVT -On anticoagulation  Heart failure with ejection fraction of 20 to 25% Global hypokinesis -Cautious diuresis  Urinary retention -Maintain Foley catheter  Nutrition -Refuses cortrak, -Enteral nutrition as tolerated  Hypernatremia -Resolved with D5  Best Practice (right click and "Reselect all SmartList Selections" daily)   Diet/type: NPO, may take orally as tolerated DVT prophylaxis: systemic heparin GI prophylaxis: PPI Lines: N/A Foley:  N/A Code Status:  DNR Last date of multidisciplinary goals of care discussion:  Reviewed patients medical history with daughter, current respiratory burden, DIC, dusky toes and AF/flutter.  We reviewed that most likely, her current issues are cumulative from recent illnesses with prolonged immobilization, poor intake, surgery and recurrent lung cancer.  She indicates the patient has previously expressed that she would not want to be on mechanical ventilation.  Her daughter is hopeful she will recover but understands that she may be in a difficult place where she will not be able to recover & this may mean end of life.  We discussed the concept of proceeding forward with all interventions that do not come with great discomfort for Heather [name].  We also discussed no CPR / no ventilation and Heather [name] notes that she will need to speak to her family (patient has 3 brothers and 2 other sons).    Ongoing discussions regarding goals of care .  Appreciate palliative care involvement  Labs   CBC: Recent Labs  Lab 01/24/21 0958 01/24/21 0958 01/21/2021 1406 01/17/2021 2100 01/26/21 0100 01/27/21 0302 01/28/21 0629 31-Jan-2021 0242  WBC 15.8*  --  15.2*  --  17.9*  17.8* 25.0* 22.5*  NEUTROABS 12.1*  --  10.7*  --   --  13.6* 19.3*  --   HGB 10.5*  --  11.5*  --  10.7* 9.5* 8.6* 7.7*  HCT 30.5*  --  35.3*  --  32.5* 29.3* 27.2* 25.5*  MCV 91.3  --  95.4  --  95.3 96.7 101.1* 102.4*  PLT 98*   < > 73* 76* 72* 66* 95*  96* 84*   < > = values in this interval not displayed.    Basic Metabolic Panel: Recent Labs  Lab 01/26/21 0100 01/26/21 0855 01/27/21 0302 01/27/21 1223 01/27/21 1632 01/28/21 0629 01/28/21 1713 01-31-2021 0242  NA 146* 147* 148*  --   --  149*  --  136  K 3.3* 3.5 3.8  --   --  3.0*  --  3.5  CL 105 107 109  --   --  112*  --  103  CO2 29 30 26   --   --  29  --  25  GLUCOSE 118* 99 118*  --   --  154*  --  466*  BUN 49* 53* 53*  --   --  41*  --  28*  CREATININE 1.24* 1.35* 1.29*  --   --  1.25*  --  0.95  CALCIUM 9.9 9.6 9.0  --   --  8.6*  --  7.5*  MG  --   --  2.3  --   --  2.5*  --  2.2  PHOS  --   --   --  3.7 3.2 2.7 2.1*  --    GFR: Estimated Creatinine Clearance: 45.9 mL/min (by C-G formula based on SCr of 0.95 mg/dL). Recent Labs  Lab 02/01/2021 2056 01/10/2021 2100 01/26/21 0100 01/27/21 0302 01/28/21 0629 02-21-2021 0242  PROCALCITON  --  <0.10  --   --   --   --   WBC  --   --  17.9* 17.8* 25.0* 22.5*  LATICACIDVEN 2.1*  --  2.7*  --  2.1*  --     Liver Function Tests: Recent Labs  Lab 01/24/21 0958 01/27/2021 1406 01/26/21 0855 01/27/21 0302 01/28/21 0629  AST 55* 58* 52* 43* 33  ALT 58* 66* 48* 44 39  ALKPHOS 202* 219* 156* 144* 129*  BILITOT 1.3* 1.5* 0.9 1.0 0.8  PROT 6.5 7.2 5.6* 5.5* 5.6*  ALBUMIN 3.2* 3.8 3.0* 2.8* 2.9*   No results for input(s): LIPASE, AMYLASE in the last 168 hours. No results for input(s): AMMONIA in the last 168 hours.  ABG    Component Value Date/Time   PHART 7.438 (H) 09/25/2011 0423   PCO2ART 42.3 09/25/2011 0423   PO2ART 72.4 (L) 09/25/2011 0423   HCO3 30.6 (H) 01/17/2021 1406   TCO2 29.4 09/25/2011 0423   O2SAT 17.0 02/03/2021 1406     Coagulation  Profile: Recent Labs  Lab 01/31/2021 2100 01/26/21 0855 01/28/21 0629  INR 3.2* 2.0* 1.7*    Cardiac Enzymes: No results for input(s): CKTOTAL, CKMB, CKMBINDEX, TROPONINI in the last 168 hours.  HbA1C: No results found for: HGBA1C  CBG: Recent Labs  Lab 01/28/21 1629 01/28/21 2038 February 21, 2021 0016 2021-02-21 0423 02-21-21 0830  GLUCAP 171* 159* 167* 174* 178*    Social History:   reports that she has been smoking cigarettes. She has a 6.00 pack-year smoking history. She has never used smokeless tobacco. She reports current alcohol use. She reports that she does not use drugs.   Family History:  Her family history includes Heart disease in her brother, mother, and sister; Hypertension in her mother. There is no history of Colon cancer.   Allergies Allergies  Allergen Reactions   Ibuprofen Nausea And Vomiting and Other (See Comments)    "Allergic," per MAR   Powder Rash and Other (See Comments)    NO gloves with powder- "Allergic," per MAR     Sherrilyn Rist, MD [place] [facility] Pager: See Shea Evans

## 2021-02-06 NOTE — Progress Notes (Signed)
Reviewed notes from Palliative Care.  No new recs at this time.  Poor prognosis.

## 2021-02-06 NOTE — Progress Notes (Signed)
85 mL IV dilaudid wasted into stericycle, witnessed by Rayfield Citizen, RN

## 2021-02-06 NOTE — Death Summary Note (Signed)
DEATH SUMMARY   Patient Details  Name: Heather [name] MRN: [id number] DOB: 1940-11-23  Admission/Discharge Information   Admit Date:  02-06-2021  Date of Death: Date of Death: February 10, 2021  Time of Death: Time of Death: 1954 (Simultaneous filing. User may not have seen previous data.)  Length of Stay: 4  Referring Physician: Rosita Fire, MD   Reason(s) for Hospitalization  Shortness of breath/acute respiratory distress with hypoxia  Diagnoses  Preliminary cause of death:  Secondary Diagnoses (including complications and co-morbidities):  Principal Problem:   Acute on chronic respiratory failure with hypoxia ([place]) Active Problems:   DIC (disseminated intravascular coagulation) ([place])   Goals of care, counseling/discussion   Adenocarcinoma of left lung, stage 4 ([place])   Atherosclerosis of native artery of left lower extremity with gangrene ([place])   Atrial fibrillation with RVR ([place])   Left leg DVT ([place])   Decubitus ulcer of sacral region, stage 1   Acute on chronic systolic CHF (congestive heart failure) ([place])   CAP (community acquired pneumonia)   Acute on chronic congestive heart failure ([place])   Hypokalemia   Malnutrition of moderate degree   Palliative care by specialist   Brief Hospital Course (including significant findings, care, treatment, and services provided and events leading to death)  Heather [name] is a 80 y.o. year old female with history of non-small cell lung cancer, stage IV, prior transient paroxysmal A. fib not on anticoagulation, hypertension, PAD with recent amputation of 2 toes on the left foot due to necrosis last week, chronic systolic CHF with EF 30 to 35% on prior 2D echo presented to the ED with worsening shortness of breath, increased O2 requirements and requiring at least 6 L of O2 at facility.  Patient had presented with worsening shortness of breath.  Patient noted to have a progressive decline over the past month.  Work-up in the ED including CT  angiogram chest concerning for worsening non-small cell lung cancer, lower extremity Dopplers done concerning for left lower extremity peroneal DVT and patient started on heparin, DIC panel obtained concerning for DIC.  Patient on Cardizem drip.  Patient placed on empiric IV antibiotics for possible pneumonia.  Patient with worsening respiratory status, trial of BiPAP done on admission discontinued due to nausea and possible emesis.  Oncology consulted.  PCCM consulted and due to worsening respiratory status, impending DIC.  Palliative care also consulted as [name].  1 acute on chronic respiratory failure with hypoxia -Likely multifactorial secondary to worsening non-small cell lung cancer stage IV in the setting of possible CAP, component of acute CHF secondary to A. fib with RVR, possible PAH, concern for Tagrisso pulmonary toxicity. -Patient initially placed on the BiPAP which had to be discontinued due to onset of nausea. -Patient with tachypnea, respiratory distress with increased use of accessory muscles of respiration, high risk for intubation.   -Patient was placed on empiric IV antibiotics, IV Solu-Medrol 40 mg every 12 hours, IV PPI, IV Rocephin, IV azithromycin. -Patient was also maintained on scheduled Xopenex, Atrovent nebs, Pulmicort nebs, Brovana nebs. -Trial of IV Lasix was done. -PCCM and oncology consulted and followed the patient during the hospitalization.  -Patient with poor prognosis seen by palliative care and made a DNR.   -Patient's condition deteriorated, palliative care met with family and decision was made to transition to full comfort measures.   -Patient subsequently died at [place] hrs. on 02-10-2021.   -May her soul rest in peace.   2.  Probable DIC -Questionable etiology, likely  secondary to worsening malignancy and possible infection etiology -Concern for DIC as patient with elevated PTT > 200, fibrinogen of < 60, D-dimer of 17.35, smear with schistocytes noted.  Patient  on heparin drip secondary to left lower extremity DVT. -Hematology/oncology consulted and following. -DIC panel with improvement with lab work early on during the hospitalization. -PCCM consulted and followed the patient during the hospitalization.   -Patient maintained on IV antibiotics.   -Full dose heparin and subsequently transition to full dose Lovenox.  -Patient's condition worsened, palliative care consulted, patient was made for comfort measures.   -Patient subsequently died in 1954 hrs. on 02/13/2021.   3.  Suspected CAP -MRSA PCR negative. -COVID-19 PCR negative. -Patient maintained on empiric IV Rocephin, IV azithromycin.  -Patient's condition deteriorated, palliative care consulted and patient made full comfort measures.   -Patient subsequently died at [place] hrs. on 02/13/2021.    4.  A. fib with RVR ( CHA2DS2VASC score 8)/PVCs -Was on Cardizem drip however discontinued and patient started on IV digoxin per cardiology. -Per RN patient unable to tolerate oral pills and as such Toprol-XL changed to liquid Lopressor twice daily. -2D echo with EF of 20 to 25%, left ventricular global hypokinesis, severely dilated left atrial size, mildly dilated right atrial size, small pericardial effusion present.  -Patient noted to have some PVCs and heart rate fluctuating from the 80s to the 120s. -Lopressor dose adjusted and increased, patient maintained on digoxin.   -Patient also was placed on full dose heparin and subsequently transition to full dose Lovenox.   -Patient was seen in consultation by cardiology and patient felt to have a poor prognosis and cardiology recommended palliative care.   -Patient's condition deteriorated, patient seen in consultation by palliative care, patient made full comfort measures.   -Patient subsequently died at [place] hrs. on 02/13/2021.    5.  Acute on chronic systolic CHF -Likely transient secondary to A. fib with RVR. -Patient did not look volume overloaded on  examination however patient on 10 L high flow nasal cannula, diffuse rhonchorous breath sounds with crackles noted.  -Patient with poor oral intake.   -2D echo with EF of 20 to 25%, left ventricular global hypokinesis, severely dilated left atrial size, mildly dilated right atrial size, small pericardial effusion present.  -Due to worsening EF and patient presentation with acute respiratory distress patient seen in consultation by cardiology.   -Cardizem drip discontinued and patient started on digoxin per cardiology.   -Patient was made n.p.o. as patient with significant aspiration risk.   -Patient placed on IV Lasix.   -Patient seen by cardiology and felt to be poor prognosis and recommended palliative care.   -Patient's condition deteriorated.   -Palliative care consulted and patient subsequently made full comfort measures.   -Patient died in 1954 hrs. on 02/13/2021.     6.  Non-small cell lung cancer -Worsening non-small cell lung cancer per CT scan. -Tagrisso was held secondary to problem #1 and concern for pulmonary toxicity..   -Oncology consulted and following and are in agreement with holding Tagrisso at this time.   -Patient's condition deteriorated, palliative care consulted, patient made full comfort measures. -Patient subsequently died in 1954 hrs. on February 13, 2021.  7.  Left lower extremity DVT -CT angiogram chest negative for PE. -Patient initially placed on heparin drip which was subsequently transition to full dose Lovenox.   -Patient's condition deteriorated, palliative care consulted, patient made full comfort measures.   -Patient subsequently died in [place] hrs. on 02-13-2021.  8.  Thrombocytopenia -Patient with a DVT left lower extremity. -Thrombocytopenia likely secondary to DIC versus consumption of platelets by DVT.   -Platelet count fluctuating. -Patient initially placed on heparin drip and subsequently transition to full dose Lovenox.   -Hematology/oncology were  consulted and followed the patient.  -Patient's condition deteriorated, palliative care consulted, patient made full comfort measures.   -Patient subsequently died at [place] hrs. on 02/25/21.   9.  Left lower extremity gangrene of the toes -Status post amputation of fourth and fifth toes last week. -Second and third toes with bluish discoloration noted during the hospitalization. -Wound care dressing changes were done. -Patient significantly ill during the hospitalization and it was noted that patient would not be able to tolerate any kind of procedures and as such patient was monitored.   -This was discussed with family who was in agreement.   -Patient's condition deteriorated, palliative care consulted to follow the patient throughout the hospitalization, patient made full comfort measures.   -Patient died at [place] hrs. on 02/25/21.   10.  Hypertension -Blood pressure was soft but improved with gentle hydration. -Patient's condition deteriorated. -Palliative care consulted and patient was made for comfort measures. -Patient died at [place] hrs. on 02-25-21.  11.  Hypokalemia -Potassium was repleted during the hospitalization.  12.  Dysphagia -Patient assessed by speech therapy and patient noted to be at severe aspiration risk at this time and recommended n.p.o. with ice chips, teaspoons of thin water.  Cortrack ordered however patient noted not to be able to tolerate it.  In discussion with family it is noted that patient would not want means of artificial feeding. -Palliative care was consulted and followed the patient throughout the hospitalization. -Patient's condition deteriorated and patient subsequently was placed on full comfort measures. -Patient subsequently died at [place] hrs. on 02/25/2021.  14.  Hypernatremia -Likely secondary to volume depletion and dehydration. -Improved on D5W.   -Patient's condition worsened. -Patient continued to decline, palliative care consulted,  decision made to transition to full comfort measures. -Patient subsequently died at [place] hrs. on 2021-02-25.        Pertinent Labs and Studies  Significant Diagnostic Studies DG Chest 2 View  Result Date: 01/11/2021 CLINICAL DATA:  Hemoptysis.  History of lung cancer EXAM: CHEST - 2 VIEW COMPARISON:  11/15/2020 FINDINGS: Cardiomegaly. Stable aortic and hilar contours. Interstitial coarsening with chronic patchy bilateral pulmonary opacity greatest at the left apex and right base. No Kerley lines, effusion, or pneumothorax. IMPRESSION: 1. No localized source of hemoptysis. 2. Chronic cardiomegaly and bilateral pulmonary opacities. Electronically Signed   By: Monte Fantasia M.D.   On: 01/11/2021 04:27   CT Angio Chest PE W/Cm &/Or Wo Cm  Result Date: 01/23/2021 CLINICAL DATA:  PE suspected, high prob. Pt has a hx of lung cancer. Pt c/o shortness of breath today at 1145. Pt was placed on 6L and was titrated down to 2L. Recent toe surgery EXAM: CT ANGIOGRAPHY CHEST WITH CONTRAST TECHNIQUE: Multidetector CT imaging of the chest was performed using the standard protocol during bolus administration of intravenous contrast. Multiplanar CT image reconstructions and MIPs were obtained to evaluate the vascular anatomy. CONTRAST:  16m OMNIPAQUE IOHEXOL 350 MG/ML SOLN COMPARISON:  CT chest 09/30/2020 FINDINGS: Cardiovascular: Satisfactory opacification of the pulmonary arteries to the segmental level. No evidence of pulmonary embolism. Enlarged main pulmonary artery. Cardiomegaly. No pericardial effusion. The thoracic aorta is in caliber. Atherosclerotic plaque is noted. Artery calcifications noted. Mediastinum/Nodes: Limited evaluation for lymphadenopathy due to timing  of contrast. No enlarged mediastinal, hilar, or axillary lymph nodes. Thyroid gland, trachea, and esophagus demonstrate no significant findings. Lungs/Pleura: Interval increase in size of a difficult to measure 4.1 x 1.9 x 4.3 Cm (from 1.6 x 1.4  cm) left apical spiculated pulmonary mass. Less conspicuous 1 1 cm nodular like density within lower lobe (9:58). Interval increase in diffuse patchy ground-glass airspace opacities that are more prominent within the lower lung zones. Bilateral trace volume pleural effusions. No pneumothorax. Upper Abdomen: Poor visualization of a suprarenal abdominal aorta aneurysm measuring up to 3.2 cm on axial imaging (7:35). Poor visualization of abdominal aorta aneurysm to 3.6 cm axial imaging (7:50). Partially visualized fluid density lesion kidney. No acute abnormality. No acute abnormality. Musculoskeletal: No chest wall abnormality. No suspicious lytic or blastic osseous lesions. No acute displaced fracture. Multilevel degenerative changes of the spine. Review of the MIP images confirms the above findings. IMPRESSION: 1. No pulmonary embolus. 2. Interval increase in size of a difficult to measure 4.1 x 1.9 cm (from 1.6 x 1.4 cm) left apical spiculated pulmonary mass consistent with lung cancer. 3. Interval increase in diffuse patchy ground-glass airspace opacities. Differential diagnosis includes infection versus inflammation versus adenocarcinoma. Additional imaging evaluation or consultation with Pulmonology or Thoracic Surgery recommended. 4. Bilateral trace effusions. 5. Enlarged main pulmonary artery suggestive of pulmonary hypertension. 6. Cardiomegaly. 7. Poorly visualized/incompletely evaluated abdominal aorta aneurysm measuring up to at least 3.6 cm. Recommend follow-up ultrasound every 2 years. This recommendation follows ACR consensus guidelines: White Paper of the ACR Incidental Findings Committee II on Vascular Findings. J Am Coll Radiol 2013; 10:789-794. 8.  Aortic Atherosclerosis (ICD10-I70.0). Electronically Signed   By: Iven Finn M.D.   On: 01/30/2021 19:00   PERIPHERAL VASCULAR CATHETERIZATION  Result Date: 01/12/2021 Formatting of this result is different from the original. Patient name: Heather [name]         MRN: [id number]        DOB: March 16, 1941          Sex: female   01/12/2021 Pre-operative Diagnosis: Critical limb ischemia of left lower extremity with tissue loss Post-operative diagnosis:  Same Surgeon:  Marty Heck, MD Procedure Performed: 1.  Ultrasound-guided access of right common femoral artery 2.  Aortogram including catheter selection of aorta 3.  Left lower extremity arteriogram with selection of third order branches 4.  Primary stent of left distal SFA/above knee popliteal artery high-grade stenosis with post balloon angioplasty (7 mm x 80 mm drug-coated Eluvia, postdilated with a 6 mm balloon)   Indications: 80 year old female seen in consultation yesterday by Dr. Stanford Breed with concern for ischemic left lower extremity.  She presents today for left lower extremity arteriogram plus intervention after risks benefits discussed.   Findings: Aortogram showed patent renal arteries and no flow-limiting stenosis in the aortoiliac segment..  Did have ectatic segment in the visceral segment of her abdominal aorta.   Left lower extremity runoff showed a patent common femoral and profunda with a focal high-grade greater than 80% stenosis in the distal SFA at Hunter's canal with some associated acute thrombus.  Distally the popliteal artery was widely patent with at least two-vessel runoff in the anterior tibial and posterior tibial artery given the peroneal was diminutive in the mid calf.   The lesion was crossed and primarily stented with a 7 mm x 80 mm drug coated Eluvia that was postdilated with a 6 mm angioplasty balloon.  I elected primary simply stent given concern for acute thrombus  associated with this lesion             Procedure:  The patient was identified in the holding area and taken to room 8.  The patient was then placed supine on the table and prepped and draped in the usual sterile fashion.  A time out was called.  Ultrasound was used to evaluate the right common femoral artery.  It  was patent .  A digital ultrasound image was acquired.  A micropuncture needle was used to access the right common femoral artery under ultrasound guidance.  An 018 wire was advanced without resistance and a micropuncture sheath was placed.  The 018 wire was removed and a benson wire was placed.  The micropuncture sheath was exchanged for a 5 french sheath.  An omniflush catheter was advanced over the wire to the level of L-1.  An abdominal angiogram was obtained.  Next, using the omniflush catheter and a benson wire, the aortic bifurcation was crossed and the catheter was placed into theleft external iliac artery and left runoff was obtained.  Ultimately identified a high-grade greater than 80% stenosis in the distal left SFA at Hunter's canal.  We elected to treat this with primary stent placement.  A Glidewire advantage was then used to exchange for a long 6 French sheath in the right groin over the aortic bifurcation.  Patient was given 100 units/kg IV heparin.  I then crossed the SFA lesion with a Glidewire advantage and confirmed with hand-injection after we crossed the lesion.  This was then primarily stented with a 7 mm x 80 mm Eluvia postdilated with a 6 mm Mustang.  Final results are widely patent stent with no residual stenosis and preserved two-vessel runoff distally.  She was taken to holding to have sheath removed.   Complication: None   Condition: Stable         Marty Heck, MD [facility] Office: [phone]    DG CHEST PORT 1 VIEW  Result Date: 01/28/2021 CLINICAL DATA:  Follow-up CHF, lung cancer, dyspnea EXAM: PORTABLE CHEST 1 VIEW COMPARISON:  01/26/2021 chest radiograph. FINDINGS: Stable cardiomediastinal silhouette with moderate cardiomegaly. No pneumothorax. No pleural effusion. Suture lines overlie the right lung apex. Patchy opacities in both lungs, most prominent in the upper left and lower right lung, mildly worsened. IMPRESSION: Moderate  cardiomegaly with mildly worsened bilateral patchy lung opacities, most prominent in the upper left and right lower lung, favor pulmonary edema superimposed on known upper left lung cancer. Electronically Signed   By: Ilona Sorrel M.D.   On: 01/28/2021 08:13   DG CHEST PORT 1 VIEW  Result Date: 01/26/2021 CLINICAL DATA:  Shortness of breath and history of lung carcinoma EXAM: PORTABLE CHEST 1 VIEW COMPARISON:  02/04/2021 FINDINGS: Cardiac shadow is enlarged but stable. Aortic calcifications are again seen. Patchy airspace opacities are noted bilaterally stable in appearance from the prior study. The patient's known left upper lobe mass lesion is not as [name] seen as on prior CT examination. No new focal abnormality is noted. IMPRESSION: Stable patchy airspace opacities bilaterally. Electronically Signed   By: Inez Catalina M.D.   On: 01/26/2021 08:50   DG Chest Portable 1 View  Result Date: 02/02/2021 CLINICAL DATA:  Shortness of breath lung cancer EXAM: PORTABLE CHEST 1 VIEW COMPARISON:  01/11/2021, CT chest 09/30/2020, chest x-ray 01/02/2021 FINDINGS: Cardiomegaly with interim development of small bilateral pleural effusions and diffuse bilateral ground-glass opacity, possible pulmonary edema versus diffuse pneumonia. Left  upper lobe lung mass is better seen on CT. Aortic atherosclerosis. IMPRESSION: 1. Cardiomegaly with small bilateral pleural effusions and diffuse bilateral ground-glass opacities consistent with pulmonary edema versus diffuse pneumonia 2. Known left upper lobe lung mass is better seen on CT Electronically Signed   By: Donavan Foil M.D.   On: 01/28/2021 15:16   VAS [place] ABI WITH/WO TBI  Result Date: 01/12/2021  LOWER EXTREMITY DOPPLER STUDY Patient Name:  Heather [name]  Date of Exam:   01/11/2021 Medical Rec #: [id number]        Accession #:    [id number] Date of Birth: [birth year]        Patient Gender: F Patient Age:   79Y Exam Location:  [facility] Procedure:      VAS [place] ABI  WITH/WO TBI Referring Phys: [id number] Yevonne Aline HAWKEN --------------------------------------------------------------------------------  Indications: Ulceration. High Risk Factors: Hypertension, Diabetes, past history of smoking. Other Factors: History of lung cancer.  Comparison Study: No prior study Performing Technologist: Sharion Dove RVS  Examination Guidelines: A complete evaluation includes at minimum, Doppler waveform signals and systolic blood pressure reading at the level of bilateral brachial, anterior tibial, and posterior tibial arteries, when vessel segments are accessible. Bilateral testing is considered an integral part of a complete examination. Photoelectric Plethysmograph (PPG) waveforms and toe systolic pressure readings are included as required and additional duplex testing as needed. Limited examinations for reoccurring indications may be performed as noted.  ABI Findings: +---------+------------------+-----+-----------+--------+ Right    Rt Pressure (mmHg)IndexWaveform   Comment  +---------+------------------+-----+-----------+--------+ Brachial 131                    multiphasic         +---------+------------------+-----+-----------+--------+ PTA      0                 0.00 absent              +---------+------------------+-----+-----------+--------+ DP       149               1.08 multiphasic         +---------+------------------+-----+-----------+--------+ Great Toe127               0.92                     +---------+------------------+-----+-----------+--------+ +---------+------------------+-----+-----------+-------+ Left     Lt Pressure (mmHg)IndexWaveform   Comment +---------+------------------+-----+-----------+-------+ Brachial 138                    multiphasic        +---------+------------------+-----+-----------+-------+ ATA      108               0.78 monophasic         +---------+------------------+-----+-----------+-------+ DP        97                0.70 monophasic         +---------+------------------+-----+-----------+-------+ Great Toe0                 0.00                    +---------+------------------+-----+-----------+-------+ +-------+-----------+-----------+------------+------------+ ABI/TBIToday's ABIToday's TBIPrevious ABIPrevious TBI +-------+-----------+-----------+------------+------------+ Right  1.08       0.92                                +-------+-----------+-----------+------------+------------+  Left   0.78       0.00                                +-------+-----------+-----------+------------+------------+  Summary: Right: Resting right ankle-brachial index is within normal range. No evidence of significant right lower extremity arterial disease. The right toe-brachial index is normal. No flow noted in the posterior tibial artery using the imager from the distal calf to the foot. Left: Resting left ankle-brachial index indicates moderate left lower extremity arterial disease. The left toe-brachial index is abnormal.  *See table(s) above for measurements and observations.  Electronically signed by Monica Martinez MD on 01/12/2021 at 5:37:21 PM.    Final    ECHOCARDIOGRAM COMPLETE  Result Date: 01/26/2021    ECHOCARDIOGRAM REPORT   Patient Name:   Heather [name] Date of Exam: 01/26/2021 Medical Rec #:  [id number]       Height:       67.0 in Accession #:    [id number]      Weight:       154.3 lb Date of Birth:  Jan 09, 1941       BSA:          1.811 m Patient Age:    55 years        BP:           150/75 mmHg Patient Gender: F               HR:           103 bpm. Exam Location:  Inpatient Procedure: 2D Echo, Cardiac Doppler and Color Doppler Indications:    CHF-Acute Systolic O27.03                 Atrial Fibrillation I48.91  History:        Patient has prior history of Echocardiogram examinations, most                 recent 05/07/2019. CHF; Risk Factors:Hypertension and Current                  Smoker.  Sonographer:    Bernadene Person RDCS Referring Phys: [phone] JARED M GARDNER  Sonographer Comments: Image acquisition challenging due to respiratory motion. Pt sitting up during entire echo. IMPRESSIONS  1. Left ventricular ejection fraction, by estimation, is 20 to 25%. The left ventricle has severely decreased function. The left ventricle demonstrates global hypokinesis. There is mild concentric left ventricular hypertrophy. Left ventricular diastolic  parameters are indeterminate.  2. Right ventricular systolic function was not [name] visualized. The right ventricular size is normal. There is normal pulmonary artery systolic pressure. The estimated right ventricular systolic pressure is 38.1 mmHg.  3. Left atrial size was severely dilated.  4. Right atrial size was mildly dilated.  5. A small pericardial effusion is present.  6. The mitral valve is grossly normal. Trivial mitral valve regurgitation. No evidence of mitral stenosis.  7. The aortic valve is tricuspid. Aortic valve regurgitation is trivial. No aortic stenosis is present.  8. The inferior vena cava is normal in size with <50% respiratory variability, suggesting right atrial pressure of 8 mmHg. Comparison(s): A prior study was performed on 05/07/2019. Prior images reviewed side by side. LV function has declined; prior study done in sinus rhythm. FINDINGS  Left Ventricle: Left ventricular ejection fraction, by estimation, is 20 to 25%. The left ventricle has severely decreased function. The left  ventricle demonstrates global hypokinesis. The left ventricular internal cavity size was normal in size. There is mild concentric left ventricular hypertrophy. Left ventricular diastolic parameters are indeterminate. Right Ventricle: The right ventricular size is normal. No increase in right ventricular wall thickness. Right ventricular systolic function was not [name] visualized. There is normal pulmonary artery systolic pressure. The tricuspid  regurgitant velocity is  2.01 m/s, and with an assumed right atrial pressure of 8 mmHg, the estimated right ventricular systolic pressure is 27.0 mmHg. Left Atrium: Left atrial size was severely dilated. Right Atrium: Right atrial size was mildly dilated. Pericardium: A small pericardial effusion is present. Mitral Valve: The mitral valve is grossly normal. Trivial mitral valve regurgitation. No evidence of mitral valve stenosis. Tricuspid Valve: The tricuspid valve is normal in structure. Tricuspid valve regurgitation is mild. Aortic Valve: The aortic valve is tricuspid. Aortic valve regurgitation is trivial. No aortic stenosis is present. Pulmonic Valve: The pulmonic valve was normal in structure. Pulmonic valve regurgitation is mild to moderate. No evidence of pulmonic stenosis. Aorta: The aortic root and ascending aorta are structurally normal, with no evidence of dilitation for age and body surface area. Venous: The inferior vena cava is normal in size with less than 50% respiratory variability, suggesting right atrial pressure of 8 mmHg. IAS/Shunts: The atrial septum is grossly normal.  LEFT VENTRICLE PLAX 2D LVIDd:         5.60 cm LVIDs:         5.00 cm LV PW:         1.10 cm LV IVS:        1.10 cm LVOT diam:     2.10 cm LV SV:         31 LV SV Index:   17 LVOT Area:     3.46 cm  LV Volumes (MOD) LV vol d, MOD A4C: 109.0 ml LV vol s, MOD A4C: 89.7 ml LV SV MOD A4C:     109.0 ml RIGHT VENTRICLE RV S prime:     9.49 cm/s TAPSE (M-mode): 1.7 cm LEFT ATRIUM             Index       RIGHT ATRIUM           Index LA diam:        4.70 cm 2.59 cm/m  RA Area:     20.40 cm LA Vol (A2C):   56.7 ml 31.30 ml/m RA Volume:   47.00 ml  25.95 ml/m LA Vol (A4C):   87.4 ml 48.25 ml/m LA Biplane Vol: 73.1 ml 40.36 ml/m  AORTIC VALVE LVOT Vmax:   64.43 cm/s LVOT Vmean:  43.933 cm/s LVOT VTI:    0.088 m  AORTA Ao Root diam: 3.20 cm Ao Asc diam:  3.80 cm MITRAL VALVE               TRICUSPID VALVE MV Area (PHT): 2.17 cm    TR  Peak grad:   16.2 mmHg MV Decel Time: 349 msec    TR Vmax:        201.00 cm/s MV E velocity: 42.10 cm/s MV A velocity: 37.70 cm/s  SHUNTS MV E/A ratio:  1.12        Systemic VTI:  0.09 m                            Systemic Diam: 2.10 cm Rudean Haskell MD Electronically signed by Rudean Haskell MD  Signature Date/Time: 01/26/2021/2:35:43 PM    Final    VAS [place] LOWER EXTREMITY VENOUS (DVT) ([facility] and [facility] 7a-7p)  Result Date: 01/26/2021  Lower Venous DVT Study Patient Name:  Heather [name]  Date of Exam:   02/04/2021 Medical Rec #: [id number]        Accession #:    [id number] Date of Birth: 20-May-1941        Patient Gender: F Patient Age:   79Y Exam Location:  [facility] Procedure:      VAS [place] LOWER EXTREMITY VENOUS (DVT) Referring Phys: [id number] RACHEL MORGAN LITTLE --------------------------------------------------------------------------------  Indications: Pain.  Risk Factors: None identified. Comparison Study: No prior studies. Performing Technologist: Oliver Hum RVT  Examination Guidelines: A complete evaluation includes B-mode imaging, spectral Doppler, color Doppler, and power Doppler as needed of all accessible portions of each vessel. Bilateral testing is considered an integral part of a complete examination. Limited examinations for reoccurring indications may be performed as noted. The reflux portion of the exam is performed with the patient in reverse Trendelenburg.  +-----+---------------+---------+-----------+----------+--------------+ RIGHTCompressibilityPhasicitySpontaneityPropertiesThrombus Aging +-----+---------------+---------+-----------+----------+--------------+ CFV  Full           Yes      Yes                                 +-----+---------------+---------+-----------+----------+--------------+   +---------+---------------+---------+-----------+----------+--------------+ LEFT     CompressibilityPhasicitySpontaneityPropertiesThrombus Aging  +---------+---------------+---------+-----------+----------+--------------+ CFV      Full           Yes      Yes                                 +---------+---------------+---------+-----------+----------+--------------+ SFJ      Full                                                        +---------+---------------+---------+-----------+----------+--------------+ FV Prox  Full                                                        +---------+---------------+---------+-----------+----------+--------------+ FV Mid   Full                                                        +---------+---------------+---------+-----------+----------+--------------+ FV DistalFull                                                        +---------+---------------+---------+-----------+----------+--------------+ PFV      Full                                                        +---------+---------------+---------+-----------+----------+--------------+  POP      Full           Yes      Yes                                 +---------+---------------+---------+-----------+----------+--------------+ PTV      Full                                                        +---------+---------------+---------+-----------+----------+--------------+ PERO     Partial                                      Acute          +---------+---------------+---------+-----------+----------+--------------+     Summary: RIGHT: - No evidence of common femoral vein obstruction.  LEFT: - Findings consistent with acute deep vein thrombosis involving the left peroneal veins. - No cystic structure found in the popliteal fossa.  *See table(s) above for measurements and observations. Electronically signed by Monica Martinez MD on 01/26/2021 at 2:25:42 PM.    Final     Microbiology Recent Results (from the past 240 hour(s))  Resp Panel by RT-PCR (Flu A&B, Covid) Nasopharyngeal Swab     Status: None    Collection Time: 02/01/2021  2:06 PM   Specimen: Nasopharyngeal Swab; Nasopharyngeal(NP) swabs in vial transport medium  Result Value Ref Range Status   SARS Coronavirus 2 by RT PCR NEGATIVE NEGATIVE Final    Comment: (NOTE) SARS-CoV-2 target nucleic acids are NOT DETECTED.  The SARS-CoV-2 RNA is generally detectable in upper respiratory specimens during the acute phase of infection. The lowest concentration of SARS-CoV-2 viral copies this assay can detect is 138 copies/mL. A negative result does not preclude SARS-Cov-2 infection and should not be used as the sole basis for treatment or other patient management decisions. A negative result may occur with  improper specimen collection/handling, submission of specimen other than nasopharyngeal swab, presence of viral mutation(s) within the areas targeted by this assay, and inadequate number of viral copies(<138 copies/mL). A negative result must be combined with clinical observations, patient history, and epidemiological information. The expected result is Negative.  Fact Sheet for Patients:  EntrepreneurPulse.com.au  Fact Sheet for Healthcare Providers:  IncredibleEmployment.be  This test is no t yet approved or cleared by the [place] FDA and  has been authorized for detection and/or diagnosis of SARS-CoV-2 by FDA under an Emergency Use Authorization (EUA). This EUA will remain  in effect (meaning this test can be used) for the duration of the COVID-19 declaration under Section 564(b)(1) of the Act, 21 U.S.C.section 360bbb-3(b)(1), unless the authorization is terminated  or revoked sooner.       Influenza A by PCR NEGATIVE NEGATIVE Final   Influenza B by PCR NEGATIVE NEGATIVE Final    Comment: (NOTE) The Xpert Xpress SARS-CoV-2/FLU/RSV plus assay is intended as an aid in the diagnosis of influenza from Nasopharyngeal swab specimens and should not be used as a sole basis for treatment.  Nasal washings and aspirates are unacceptable for Xpert Xpress SARS-CoV-2/FLU/RSV testing.  Fact Sheet for Patients: EntrepreneurPulse.com.au  Fact Sheet for Healthcare Providers: IncredibleEmployment.be  This test is not yet approved or cleared  by the [place] and has been authorized for detection and/or diagnosis of SARS-CoV-2 by FDA under an Emergency Use Authorization (EUA). This EUA will remain in effect (meaning this test can be used) for the duration of the COVID-19 declaration under Section 564(b)(1) of the Act, 21 U.S.C. section 360bbb-3(b)(1), unless the authorization is terminated or revoked.  Performed at [facility], [place] [address], [place], [place] [address]   MRSA Next Gen by PCR, Nasal     Status: None   Collection Time: 02/01/2021  9:07 PM   Specimen: Nasal Mucosa; Nasal Swab  Result Value Ref Range Status   MRSA by PCR Next Gen NOT DETECTED NOT DETECTED Final    Comment: (NOTE) The GeneXpert MRSA Assay (FDA approved for NASAL specimens only), is one component of a comprehensive MRSA colonization surveillance program. It is not intended to diagnose MRSA infection nor to guide or monitor treatment for MRSA infections. Test performance is not FDA approved in patients less than 67 years old. Performed at [facility], [place] [address] [place], [place] [address]   Culture, blood (Routine X 2) w Reflex to ID Panel     Status: None (Preliminary result)   Collection Time: 01/26/21  8:55 AM   Specimen: BLOOD RIGHT HAND  Result Value Ref Range Status   Specimen Description   Final    BLOOD RIGHT HAND Performed at [facility] [address] [place], [place] [address]    Special Requests   Final    BOTTLES DRAWN AEROBIC ONLY Blood Culture results may not be optimal due to an inadequate volume of blood received in culture bottles Performed at [place] [address] [place], [place] [address]    Culture   Final    NO GROWTH 3 DAYS Performed at [facility], [place] [address], [place], [place] [address]    Report Status PENDING  Incomplete  Culture, blood (Routine X 2) w Reflex to ID Panel     Status: None (Preliminary result)   Collection Time: 01/26/21  8:55 AM   Specimen: BLOOD LEFT HAND  Result Value Ref Range Status   Specimen Description   Final    BLOOD LEFT HAND Performed at [facility] [address], [place], [place] [address]    Special Requests   Final    BOTTLES DRAWN AEROBIC ONLY Blood Culture results may not be optimal due to an inadequate volume of blood received in culture bottles Performed at [facility] [address], [place], [place] [address]    Culture   Final    NO GROWTH 3 DAYS Performed at [facility], [place] [address] [place],  [address]    Report Status PENDING  Incomplete    Lab Basic Metabolic Panel: Recent Labs  Lab 01/26/21 0100 01/26/21 0855 01/27/21 0302 01/27/21 1223 01/27/21 1632 01/28/21 0629 01/28/21 1713 02-04-2021 0242  NA 146* 147* 148*  --   --  149*  --  136  K 3.3* 3.5 3.8  --   --  3.0*  --  3.5  CL 105 107 109  --   --  112*  --  103  CO2 29 30 26   --   --  29  --  25  GLUCOSE 118* 99 118*  --   --  154*  --  466*  BUN 49* 53* 53*  --   --  41*  --  28*  CREATININE 1.24* 1.35* 1.29*  --   --  1.25*  --  0.95  CALCIUM 9.9 9.6 9.0  --   --  8.6*  --  7.5*  MG  --   --  2.3  --   --  2.5*  --  2.2  PHOS  --   --   --  3.7 3.2 2.7 2.1*  --    Liver Function Tests: Recent Labs  Lab 01/24/21 0958 01/30/2021 1406 01/26/21 0855 01/27/21 0302 01/28/21 0629  AST 55* 58* 52* 43* 33  ALT 58* 66* 48* 44 39  ALKPHOS 202* 219* 156* 144* 129*  BILITOT 1.3* 1.5* 0.9 1.0 0.8  PROT 6.5 7.2 5.6* 5.5* 5.6*  ALBUMIN 3.2* 3.8 3.0* 2.8* 2.9*   No results for input(s): LIPASE, AMYLASE in the last 168 hours. No  results for input(s): AMMONIA in the last 168 hours. CBC: Recent Labs  Lab 01/24/21 0958 01/24/21 0958 01/09/2021 1406 01/27/2021 2100 01/26/21 0100 01/27/21 0302 01/28/21 0629 02/13/21 0242  WBC 15.8*  --  15.2*  --  17.9* 17.8* 25.0* 22.5*  NEUTROABS 12.1*  --  10.7*  --   --  13.6* 19.3*  --   HGB 10.5*  --  11.5*  --  10.7* 9.5* 8.6* 7.7*  HCT 30.5*  --  35.3*  --  32.5* 29.3* 27.2* 25.5*  MCV 91.3  --  95.4  --  95.3 96.7 101.1* 102.4*  PLT 98*   < > 73* 76* 72* 66* 95*  96* 84*   < > = values in this interval not displayed.   Cardiac Enzymes: No results for input(s): CKTOTAL, CKMB, CKMBINDEX, TROPONINI in the last 168 hours. Sepsis Labs: Recent Labs  Lab 01/23/2021 2056 01/20/2021 2100 01/26/21 0100 01/27/21 0302 01/28/21 0629 02/13/2021 0242  PROCALCITON  --  <0.10  --   --   --   --   WBC  --   --  17.9* 17.8* 25.0* 22.5*  LATICACIDVEN 2.1*  --  2.7*  --  2.1*  --     Procedures/Operations  CT angiogram chest 01/24/2021 Chest x-ray 01/18/2021, 01/26/2021 Lower extremity Doppler 01/16/2021 2D echo 01/26/2021    Irine Seal 01/30/2021, 7:50 AM

## 2021-02-06 NOTE — Progress Notes (Signed)
60 mL IV morphine wasted into stericycle, witnessed by Jeanine Luz, RN

## 2021-02-06 DEATH — deceased

## 2021-02-08 ENCOUNTER — Other Ambulatory Visit (HOSPITAL_COMMUNITY): Payer: Self-pay

## 2021-02-09 DIAGNOSIS — I4891 Unspecified atrial fibrillation: Secondary | ICD-10-CM

## 2021-02-13 ENCOUNTER — Other Ambulatory Visit: Payer: Medicare Other

## 2021-02-14 ENCOUNTER — Encounter (HOSPITAL_COMMUNITY): Payer: Medicare Other

## 2021-02-16 ENCOUNTER — Ambulatory Visit: Payer: Medicare Other | Admitting: Internal Medicine

## 2021-03-20 ENCOUNTER — Other Ambulatory Visit: Payer: Medicare Other

## 2021-03-22 ENCOUNTER — Ambulatory Visit: Payer: Medicare Other | Admitting: Internal Medicine

## 2021-03-28 ENCOUNTER — Ambulatory Visit: Payer: Medicare Other | Admitting: Gastroenterology

## 2021-09-02 IMAGING — DX DG CHEST 1V PORT
1 series · 1 of 1 positions shown · non-contrast
Comparison: 01/11/2021, CT chest 09/30/2020, chest x-ray 01/02/2021

CLINICAL DATA: Shortness of breath lung cancer

EXAM:
PORTABLE CHEST 1 VIEW

[chest ap]
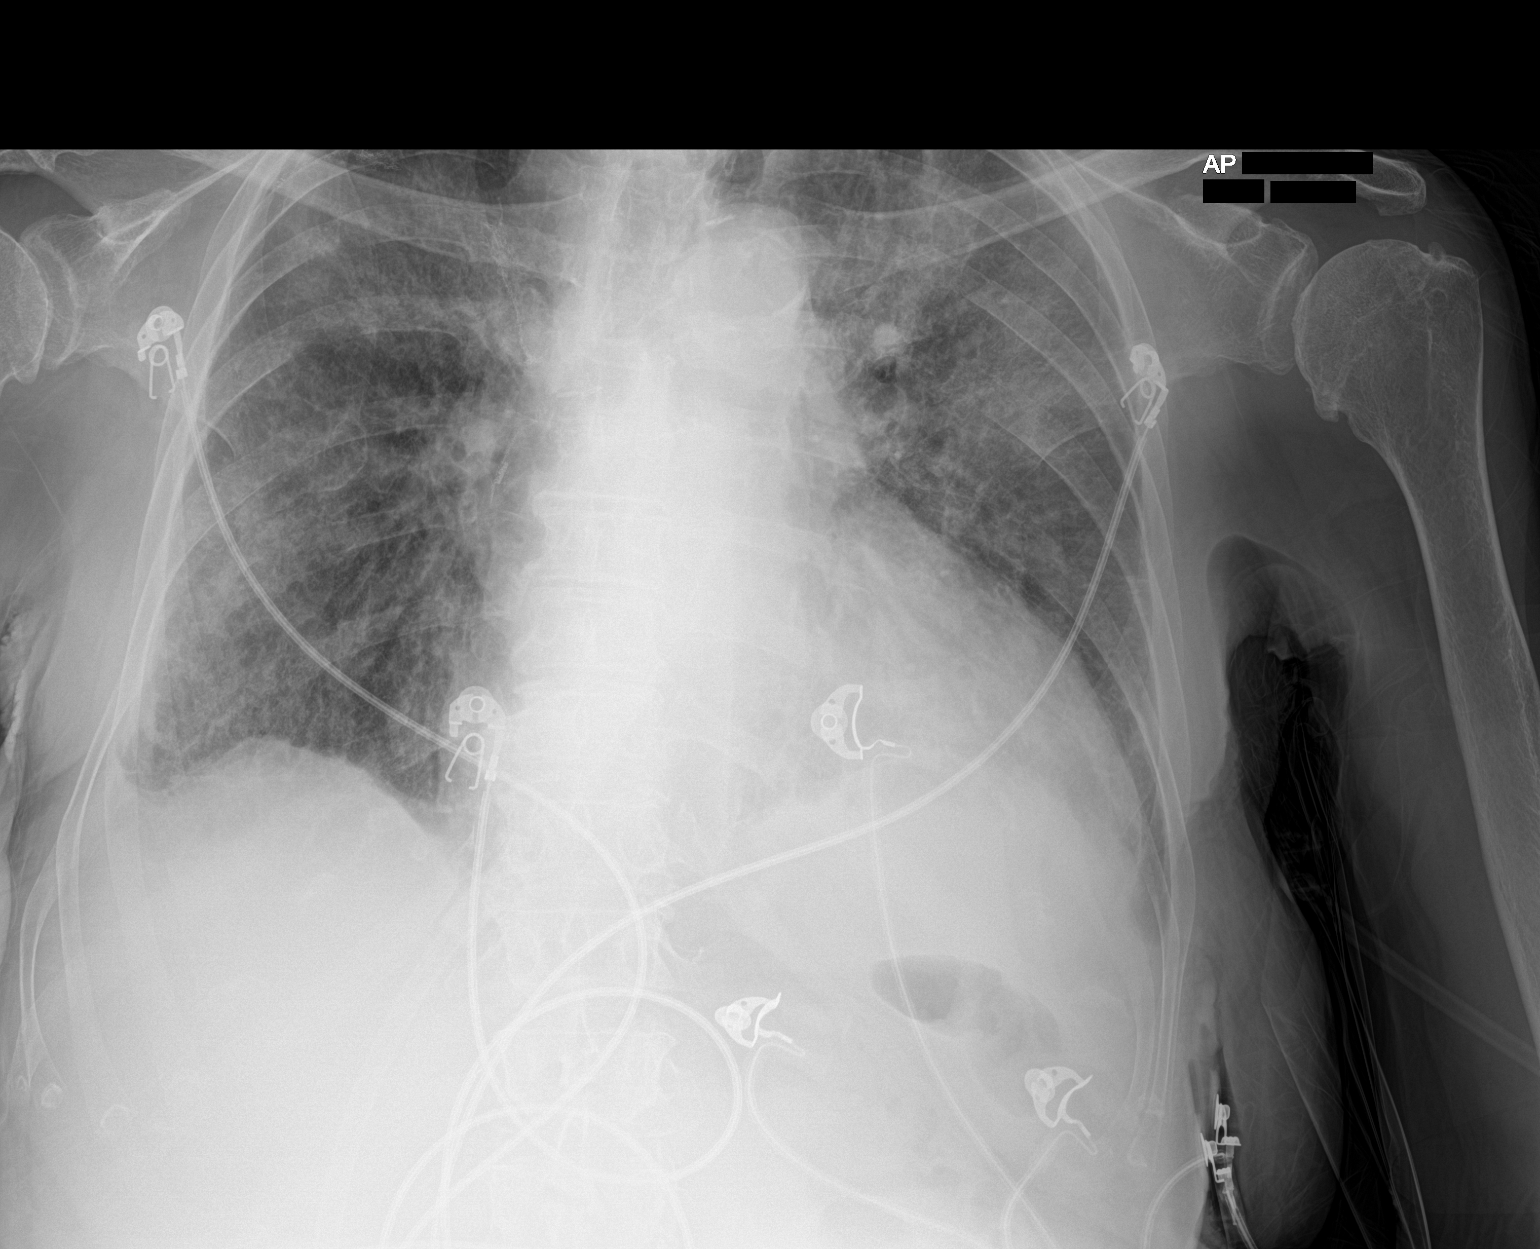

[1 of 1 positions shown; findings below may reference images not displayed]

FINDINGS: Cardiomegaly with interim development of small bilateral pleural
effusions and diffuse bilateral ground-glass opacity, possible
pulmonary edema versus diffuse pneumonia. Left upper lobe lung mass
is better seen on CT. Aortic atherosclerosis.
IMPRESSION: 1. Cardiomegaly with small bilateral pleural effusions and diffuse
bilateral ground-glass opacities consistent with pulmonary edema
versus diffuse pneumonia
2. Known left upper lobe lung mass is better seen on CT

## 2021-09-05 IMAGING — DX DG CHEST 1V PORT
1 series · 1 of 1 positions shown · non-contrast
Comparison: 01/26/2021 chest radiograph.

CLINICAL DATA: Follow-up CHF, lung cancer, dyspnea

EXAM:
PORTABLE CHEST 1 VIEW

[chest ap]
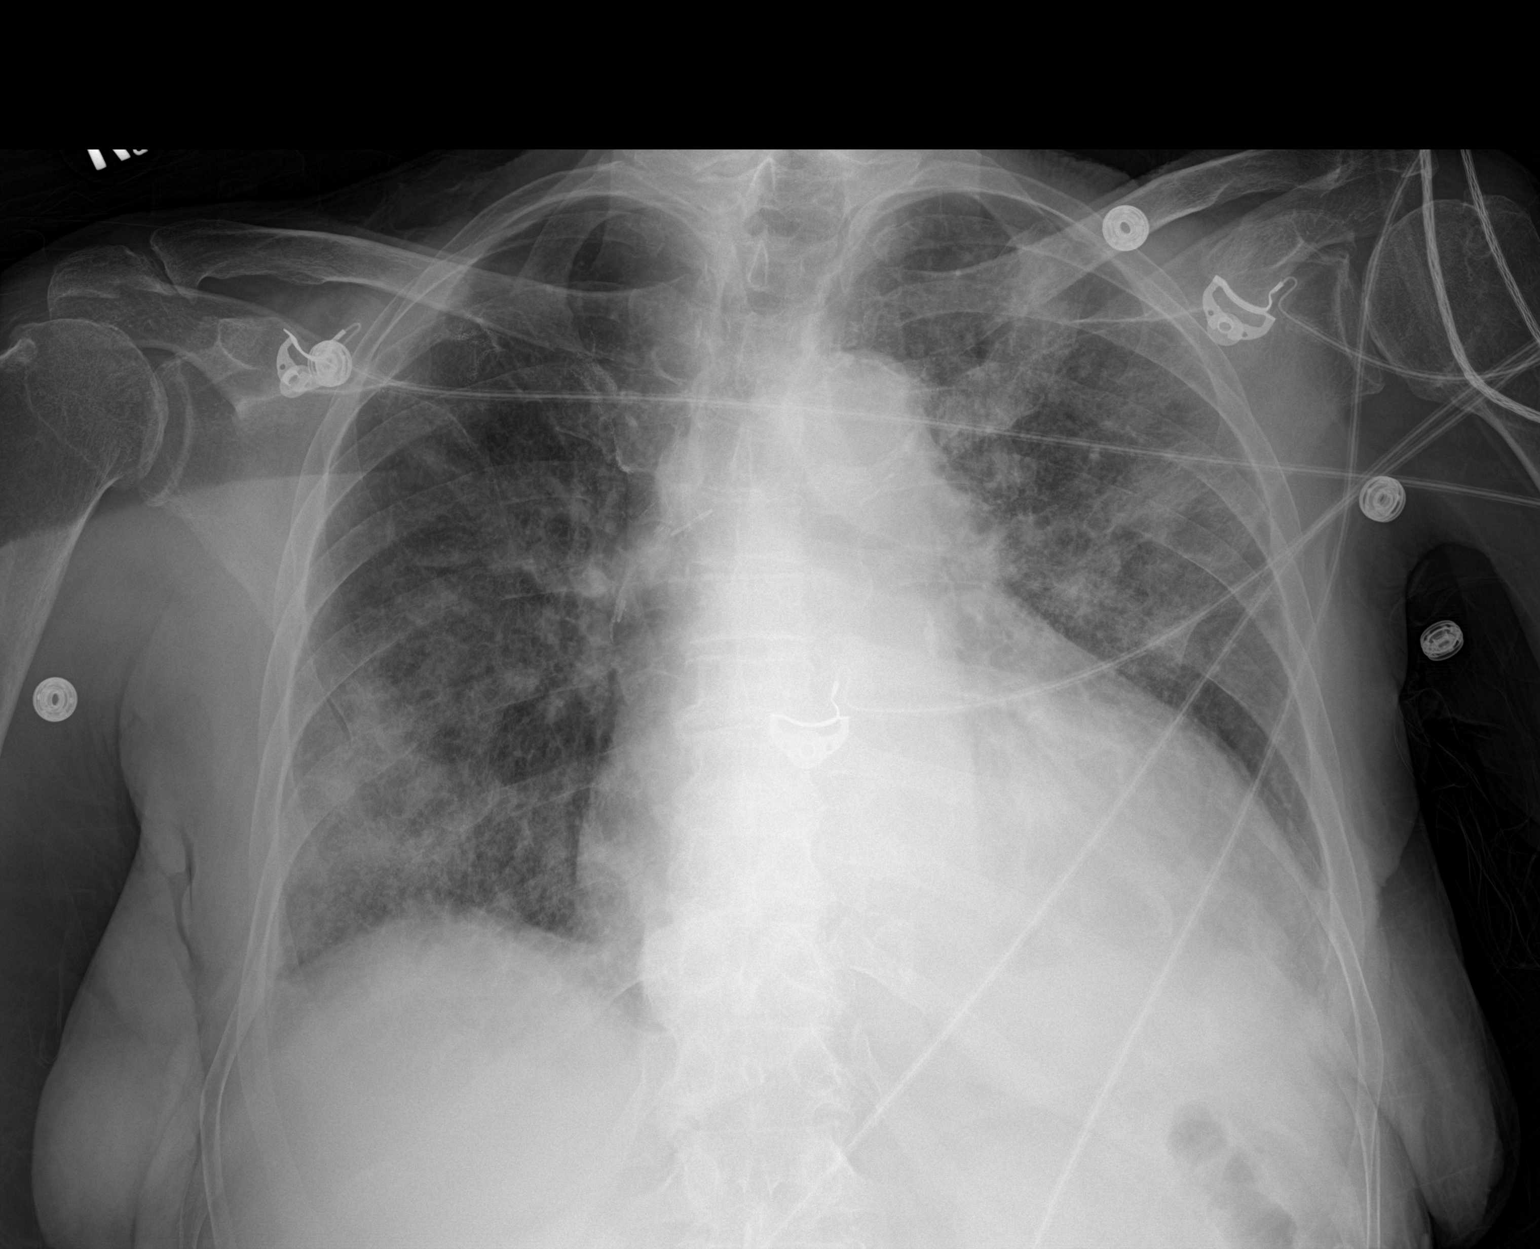

[1 of 1 positions shown; findings below may reference images not displayed]

FINDINGS: Stable cardiomediastinal silhouette with moderate cardiomegaly. No
pneumothorax. No pleural effusion. Suture lines overlie the right
lung apex. Patchy opacities in both lungs, most prominent in the
upper left and lower right lung, mildly worsened.
IMPRESSION: Moderate cardiomegaly with mildly worsened bilateral patchy lung
opacities, most prominent in the upper left and right lower lung,
favor pulmonary edema superimposed on known upper left lung cancer.
